# Patient Record
Sex: Female | Born: 1960 | Race: Black or African American | Hispanic: No | Marital: Single | Smoking: Never smoker
Health system: Southern US, Community
[De-identification: ages and names within clinical notes are randomized; demographics above are authoritative.]

## PROBLEM LIST (undated history)

## (undated) DIAGNOSIS — G609 Hereditary and idiopathic neuropathy, unspecified: Secondary | ICD-10-CM

## (undated) DIAGNOSIS — I10 Essential (primary) hypertension: Secondary | ICD-10-CM

## (undated) DIAGNOSIS — I639 Cerebral infarction, unspecified: Secondary | ICD-10-CM

## (undated) DIAGNOSIS — D239 Other benign neoplasm of skin, unspecified: Secondary | ICD-10-CM

## (undated) DIAGNOSIS — E785 Hyperlipidemia, unspecified: Secondary | ICD-10-CM

## (undated) DIAGNOSIS — I679 Cerebrovascular disease, unspecified: Secondary | ICD-10-CM

## (undated) DIAGNOSIS — D219 Benign neoplasm of connective and other soft tissue, unspecified: Secondary | ICD-10-CM

## (undated) DIAGNOSIS — E119 Type 2 diabetes mellitus without complications: Secondary | ICD-10-CM

## (undated) HISTORY — DX: Other benign neoplasm of skin, unspecified: D23.9

## (undated) HISTORY — DX: Essential (primary) hypertension: I10

## (undated) HISTORY — DX: Type 2 diabetes mellitus without complications: E11.9

## (undated) HISTORY — PX: ABDOMINAL HYSTERECTOMY: SHX81

## (undated) HISTORY — DX: Benign neoplasm of connective and other soft tissue, unspecified: D21.9

## (undated) HISTORY — DX: Hereditary and idiopathic neuropathy, unspecified: G60.9

## (undated) HISTORY — DX: Hyperlipidemia, unspecified: E78.5

## (undated) HISTORY — DX: Cerebrovascular disease, unspecified: I67.9

---

## 2000-05-11 ENCOUNTER — Emergency Department (HOSPITAL_COMMUNITY): Admission: EM | Admit: 2000-05-11 | Discharge: 2000-05-11 | Payer: Self-pay | Admitting: Emergency Medicine

## 2000-05-11 ENCOUNTER — Encounter: Payer: Self-pay | Admitting: Emergency Medicine

## 2000-12-22 ENCOUNTER — Encounter: Admission: RE | Admit: 2000-12-22 | Discharge: 2000-12-22 | Payer: Self-pay | Admitting: Internal Medicine

## 2000-12-22 ENCOUNTER — Encounter (HOSPITAL_BASED_OUTPATIENT_CLINIC_OR_DEPARTMENT_OTHER): Payer: Self-pay | Admitting: Internal Medicine

## 2003-11-08 ENCOUNTER — Inpatient Hospital Stay (HOSPITAL_COMMUNITY): Admission: EM | Admit: 2003-11-08 | Discharge: 2003-11-16 | Payer: Self-pay | Admitting: Emergency Medicine

## 2003-11-10 ENCOUNTER — Ambulatory Visit: Payer: Self-pay | Admitting: Physical Medicine & Rehabilitation

## 2003-11-16 ENCOUNTER — Inpatient Hospital Stay (HOSPITAL_COMMUNITY)
Admission: RE | Admit: 2003-11-16 | Discharge: 2003-12-11 | Payer: Self-pay | Admitting: Physical Medicine & Rehabilitation

## 2003-11-16 ENCOUNTER — Ambulatory Visit: Payer: Self-pay | Admitting: Physical Medicine & Rehabilitation

## 2004-01-02 ENCOUNTER — Encounter
Admission: RE | Admit: 2004-01-02 | Discharge: 2004-03-22 | Payer: Self-pay | Admitting: Physical Medicine & Rehabilitation

## 2004-01-04 ENCOUNTER — Ambulatory Visit: Payer: Self-pay | Admitting: Physical Medicine & Rehabilitation

## 2004-01-22 ENCOUNTER — Ambulatory Visit: Payer: Self-pay | Admitting: Internal Medicine

## 2004-02-27 ENCOUNTER — Ambulatory Visit: Payer: Self-pay | Admitting: Physical Medicine & Rehabilitation

## 2004-03-22 ENCOUNTER — Encounter
Admission: RE | Admit: 2004-03-22 | Discharge: 2004-06-14 | Payer: Self-pay | Admitting: Physical Medicine & Rehabilitation

## 2004-03-29 ENCOUNTER — Ambulatory Visit: Payer: Self-pay | Admitting: Internal Medicine

## 2004-04-25 ENCOUNTER — Ambulatory Visit: Payer: Self-pay | Admitting: Physical Medicine & Rehabilitation

## 2004-05-27 ENCOUNTER — Ambulatory Visit: Payer: Self-pay | Admitting: Internal Medicine

## 2004-06-14 ENCOUNTER — Encounter
Admission: RE | Admit: 2004-06-14 | Discharge: 2004-09-12 | Payer: Self-pay | Admitting: Physical Medicine & Rehabilitation

## 2004-06-18 ENCOUNTER — Ambulatory Visit: Payer: Self-pay | Admitting: Physical Medicine & Rehabilitation

## 2004-07-25 ENCOUNTER — Ambulatory Visit (HOSPITAL_BASED_OUTPATIENT_CLINIC_OR_DEPARTMENT_OTHER): Admission: RE | Admit: 2004-07-25 | Discharge: 2004-07-25 | Payer: Self-pay | Admitting: Plastic Surgery

## 2004-08-21 ENCOUNTER — Ambulatory Visit: Payer: Self-pay | Admitting: Internal Medicine

## 2004-09-16 ENCOUNTER — Encounter
Admission: RE | Admit: 2004-09-16 | Discharge: 2004-12-15 | Payer: Self-pay | Admitting: Physical Medicine & Rehabilitation

## 2004-09-16 ENCOUNTER — Ambulatory Visit: Payer: Self-pay | Admitting: Physical Medicine & Rehabilitation

## 2004-11-20 ENCOUNTER — Ambulatory Visit: Payer: Self-pay | Admitting: Internal Medicine

## 2005-02-20 ENCOUNTER — Ambulatory Visit: Payer: Self-pay | Admitting: Internal Medicine

## 2005-05-16 ENCOUNTER — Ambulatory Visit: Payer: Self-pay | Admitting: Internal Medicine

## 2005-06-03 ENCOUNTER — Inpatient Hospital Stay (HOSPITAL_COMMUNITY): Admission: EM | Admit: 2005-06-03 | Discharge: 2005-06-05 | Payer: Self-pay | Admitting: Emergency Medicine

## 2005-06-03 ENCOUNTER — Encounter (INDEPENDENT_AMBULATORY_CARE_PROVIDER_SITE_OTHER): Payer: Self-pay | Admitting: Cardiology

## 2005-06-17 ENCOUNTER — Ambulatory Visit: Payer: Self-pay | Admitting: Internal Medicine

## 2005-07-15 ENCOUNTER — Ambulatory Visit: Payer: Self-pay | Admitting: Internal Medicine

## 2005-08-13 ENCOUNTER — Ambulatory Visit: Payer: Self-pay | Admitting: Internal Medicine

## 2005-11-14 ENCOUNTER — Ambulatory Visit: Payer: Self-pay | Admitting: Internal Medicine

## 2006-02-13 ENCOUNTER — Ambulatory Visit: Payer: Self-pay | Admitting: Internal Medicine

## 2006-02-13 LAB — CONVERTED CEMR LAB
Albumin: 3.7 g/dL (ref 3.5–5.2)
BUN: 7 mg/dL (ref 6–23)
Calcium: 9.4 mg/dL (ref 8.4–10.5)
Chloride: 105 meq/L (ref 96–112)
Creatinine, Ser: 1 mg/dL (ref 0.4–1.2)
GFR calc non Af Amer: 64 mL/min
Glucose, Bld: 112 mg/dL — ABNORMAL HIGH (ref 70–99)
Potassium: 4.2 meq/L (ref 3.5–5.1)

## 2006-03-23 ENCOUNTER — Ambulatory Visit: Payer: Self-pay | Admitting: Internal Medicine

## 2006-05-18 ENCOUNTER — Ambulatory Visit: Payer: Self-pay | Admitting: Internal Medicine

## 2006-08-18 ENCOUNTER — Ambulatory Visit: Payer: Self-pay | Admitting: Internal Medicine

## 2006-10-12 DIAGNOSIS — I10 Essential (primary) hypertension: Secondary | ICD-10-CM

## 2006-10-12 DIAGNOSIS — E785 Hyperlipidemia, unspecified: Secondary | ICD-10-CM

## 2006-10-12 DIAGNOSIS — E119 Type 2 diabetes mellitus without complications: Secondary | ICD-10-CM

## 2006-10-12 HISTORY — DX: Essential (primary) hypertension: I10

## 2006-10-12 HISTORY — DX: Hyperlipidemia, unspecified: E78.5

## 2006-10-12 HISTORY — DX: Type 2 diabetes mellitus without complications: E11.9

## 2006-10-21 DIAGNOSIS — G609 Hereditary and idiopathic neuropathy, unspecified: Secondary | ICD-10-CM

## 2006-10-21 HISTORY — DX: Hereditary and idiopathic neuropathy, unspecified: G60.9

## 2006-11-18 ENCOUNTER — Ambulatory Visit: Payer: Self-pay | Admitting: Internal Medicine

## 2006-11-18 LAB — CONVERTED CEMR LAB: Hgb A1c MFr Bld: 7.8 % — ABNORMAL HIGH (ref 4.6–6.0)

## 2007-02-22 ENCOUNTER — Ambulatory Visit: Payer: Self-pay | Admitting: Internal Medicine

## 2007-05-24 ENCOUNTER — Ambulatory Visit: Payer: Self-pay | Admitting: Internal Medicine

## 2007-08-23 ENCOUNTER — Ambulatory Visit: Payer: Self-pay | Admitting: Internal Medicine

## 2007-11-24 ENCOUNTER — Ambulatory Visit: Payer: Self-pay | Admitting: Internal Medicine

## 2007-11-24 LAB — CONVERTED CEMR LAB
AST: 15 units/L (ref 0–37)
Basophils Absolute: 0 10*3/uL (ref 0.0–0.1)
Basophils Relative: 0.4 % (ref 0.0–3.0)
Creatinine,U: 631.3 mg/dL
Eosinophils Absolute: 0.1 10*3/uL (ref 0.0–0.7)
Eosinophils Relative: 1.7 % (ref 0.0–5.0)
GFR calc Af Amer: 68 mL/min
GFR calc non Af Amer: 57 mL/min
Glucose, Bld: 202 mg/dL — ABNORMAL HIGH (ref 70–99)
HDL: 48.1 mg/dL (ref >39.0)
Hemoglobin: 11.5 g/dL — ABNORMAL LOW (ref 12.0–15.0)
Hgb A1c MFr Bld: 9.1 % — ABNORMAL HIGH (ref 4.6–6.0)
Lymphocytes Relative: 27.4 % (ref 12.0–46.0)
Microalb, Ur: 9.8 mg/dL — ABNORMAL HIGH (ref 0.0–1.9)
Neutro Abs: 4.8 10*3/uL (ref 1.4–7.7)
RBC: 4.43 M/uL (ref 3.87–5.11)
RDW: 14.9 % — ABNORMAL HIGH (ref 11.5–14.6)
Sodium: 141 meq/L (ref 135–145)
TSH: 1.47 microintl units/mL (ref 0.35–5.50)
Total CHOL/HDL Ratio: 4.7
Total Protein: 7.5 g/dL (ref 6.0–8.3)
Triglycerides: 142 mg/dL (ref 0–149)
VLDL: 28 mg/dL (ref 0–40)
WBC: 7.6 10*3/uL (ref 4.5–10.5)

## 2007-11-25 ENCOUNTER — Telehealth: Payer: Self-pay | Admitting: Internal Medicine

## 2008-02-21 ENCOUNTER — Ambulatory Visit: Payer: Self-pay | Admitting: Internal Medicine

## 2008-02-22 LAB — CONVERTED CEMR LAB: Hgb A1c MFr Bld: 7 % — ABNORMAL HIGH (ref 4.6–6.0)

## 2008-05-24 ENCOUNTER — Ambulatory Visit: Payer: Self-pay | Admitting: Internal Medicine

## 2008-05-24 DIAGNOSIS — M25569 Pain in unspecified knee: Secondary | ICD-10-CM

## 2008-09-08 ENCOUNTER — Ambulatory Visit: Payer: Self-pay | Admitting: Internal Medicine

## 2008-09-08 DIAGNOSIS — D239 Other benign neoplasm of skin, unspecified: Secondary | ICD-10-CM | POA: Insufficient documentation

## 2008-09-08 HISTORY — DX: Other benign neoplasm of skin, unspecified: D23.9

## 2008-09-12 LAB — CONVERTED CEMR LAB: Hgb A1c MFr Bld: 10.2 % — ABNORMAL HIGH (ref 4.6–6.5)

## 2008-09-21 ENCOUNTER — Ambulatory Visit: Payer: Self-pay | Admitting: Internal Medicine

## 2008-12-04 ENCOUNTER — Ambulatory Visit: Payer: Self-pay | Admitting: Internal Medicine

## 2008-12-04 LAB — CONVERTED CEMR LAB
Glucose, Urine, Semiquant: NEGATIVE
Nitrite: NEGATIVE
pH: 5

## 2008-12-05 LAB — CONVERTED CEMR LAB
ALT: 15 units/L (ref 0–35)
AST: 18 units/L (ref 0–37)
BUN: 14 mg/dL (ref 6–23)
Basophils Relative: 0.2 % (ref 0.0–3.0)
Calcium: 9.1 mg/dL (ref 8.4–10.5)
Cholesterol: 262 mg/dL — ABNORMAL HIGH (ref 0–200)
Creatinine,U: 363.7 mg/dL
Direct LDL: 197.2 mg/dL
GFR calc non Af Amer: 76.01 mL/min (ref >60)
HDL: 43.6 mg/dL (ref >39.00)
Hemoglobin: 11.6 g/dL — ABNORMAL LOW (ref 12.0–15.0)
Hgb A1c MFr Bld: 7.8 % — ABNORMAL HIGH (ref 4.6–6.5)
MCHC: 33.5 g/dL (ref 30.0–36.0)
MCV: 76 fL — ABNORMAL LOW (ref 78.0–100.0)
Microalb Creat Ratio: 64.6 mg/g — ABNORMAL HIGH (ref 0.0–30.0)
Monocytes Absolute: 0.6 10*3/uL (ref 0.1–1.0)
Monocytes Relative: 7.8 % (ref 3.0–12.0)
Platelets: 160 10*3/uL (ref 150.0–400.0)
Potassium: 4.1 meq/L (ref 3.5–5.1)
RBC: 4.55 M/uL (ref 3.87–5.11)
RDW: 16.2 % — ABNORMAL HIGH (ref 11.5–14.6)
TSH: 1.29 microintl units/mL (ref 0.35–5.50)
Total CHOL/HDL Ratio: 6
Total Protein: 7.4 g/dL (ref 6.0–8.3)
WBC: 7.4 10*3/uL (ref 4.5–10.5)

## 2008-12-11 ENCOUNTER — Ambulatory Visit: Payer: Self-pay | Admitting: Internal Medicine

## 2008-12-11 DIAGNOSIS — I679 Cerebrovascular disease, unspecified: Secondary | ICD-10-CM

## 2008-12-11 HISTORY — DX: Cerebrovascular disease, unspecified: I67.9

## 2009-03-16 ENCOUNTER — Ambulatory Visit: Payer: Self-pay | Admitting: Internal Medicine

## 2009-03-19 ENCOUNTER — Encounter: Payer: Self-pay | Admitting: Internal Medicine

## 2009-03-19 LAB — CONVERTED CEMR LAB: AST: 16 units/L (ref 0–37)

## 2009-06-22 ENCOUNTER — Ambulatory Visit: Payer: Self-pay | Admitting: Internal Medicine

## 2009-06-22 LAB — CONVERTED CEMR LAB: Hgb A1c MFr Bld: 8.4 % — ABNORMAL HIGH (ref 4.6–6.5)

## 2009-06-27 ENCOUNTER — Telehealth: Payer: Self-pay

## 2009-07-02 ENCOUNTER — Encounter: Payer: Self-pay | Admitting: Internal Medicine

## 2009-09-21 ENCOUNTER — Ambulatory Visit: Payer: Self-pay | Admitting: Internal Medicine

## 2009-12-24 ENCOUNTER — Ambulatory Visit: Payer: Self-pay | Admitting: Internal Medicine

## 2010-03-29 ENCOUNTER — Ambulatory Visit
Admission: RE | Admit: 2010-03-29 | Discharge: 2010-03-29 | Payer: Self-pay | Source: Home / Self Care | Attending: Internal Medicine | Admitting: Internal Medicine

## 2010-03-29 ENCOUNTER — Other Ambulatory Visit: Payer: Self-pay | Admitting: Internal Medicine

## 2010-03-29 LAB — HEMOGLOBIN A1C: Hgb A1c MFr Bld: 8.2 % — ABNORMAL HIGH (ref 4.6–6.5)

## 2010-04-09 NOTE — Assessment & Plan Note (Signed)
Summary: 3 month rov/njr  aA  Vital Signs:  Patient profile:   50 year old female Weight:      244 pounds Temp:     98.5 degrees F oral BP sitting:   160 / 100  (right arm) Cuff size:   large  Vitals Entered By: Duard Brady LPN (June 22, 2009 10:30 AM) CC: 3 mos rov - doing well    fbs96 Is Patient Diabetic? Yes Did you bring your meter with you today? No   CC:  3 mos rov - doing well    fbs96.  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  He denies any focal neurological symptoms.  Her last cholesterol was elevated, but she had been taking her statin therapy only sporadically.  She states her diabetes has done quite well.  She is on Lantus 75 units at bedtime, and fasting blood sugars are essentially normal.  She is also on mealtime insulin with sliding scale coverage.  She has multi-drug-resistant hypertension, and she claims compliance, but pressure on arrival today.  Was moderately high.  Blood pressure readings last two visits have also been mildly elevated.  Her multi-drug regimen does not include diuretic therapy  Allergies (verified): No Known Drug Allergies  Past History:  Past Medical History: Reviewed history from 09/25/2008 and no changes required. 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 Thumb Surgery Hysterectomy  Review of Systems  The patient denies anorexia, fever, weight loss, weight gain, vision loss, decreased hearing, hoarseness, chest pain, syncope, dyspnea on exertion, peripheral edema, prolonged cough, headaches, hemoptysis, abdominal pain, melena, hematochezia, severe indigestion/heartburn, hematuria, incontinence, genital  sores, muscle weakness, suspicious skin lesions, transient blindness, difficulty walking, depression, unusual weight change, abnormal bleeding, enlarged lymph nodes, angioedema, and breast masses.    Physical Exam  General:  overweight-appearing.  is 160/95 Head:  Normocephalic and atraumatic without obvious abnormalities. No apparent alopecia or balding. Eyes:  No corneal or conjunctival inflammation noted. EOMI. Perrla. Funduscopic exam benign, without hemorrhages, exudates or papilledema. Vision grossly normal. Mouth:  Oral mucosa and oropharynx without lesions or exudates.  Teeth in good repair. Neck:  No deformities, masses, or tenderness noted. Lungs:  Normal respiratory effort, chest expands symmetrically. Lungs are clear to auscultation, no crackles or wheezes. Heart:  Normal rate and regular rhythm. S1 and S2 normal without gallop, murmur, click, rub or other extra sounds. Abdomen:  Bowel sounds positive,abdomen soft and non-tender without masses, organomegaly or hernias noted. Msk:  No deformity or scoliosis noted of thoracic or lumbar spine.   Pulses:  R and L carotid,radial,femoral,dorsalis pedis and posterior tibial pulses are full and equal bilaterally Extremities:  No clubbing, cyanosis, edema, or deformity noted with normal full range of motion of all joints.     Impression & Recommendations:  Problem # 1:  CEREBROVASCULAR DISEASE (ICD-437.9)  Problem # 2:  HYPERTENSION (ICD-401.9)  Her updated medication list for this problem includes:    Benazepril Hcl 40 Mg Tabs (Benazepril hcl) .Marland Kitchen... 1 once daily    Labetalol Hcl 200 Mg Tabs (Labetalol hcl) .Marland Kitchen... Take 2 twice a day    Amlodipine Besylate 10 Mg Tabs (Amlodipine besylate) .Marland Kitchen... 1 once daily    Chlorthalidone 25 Mg Tabs (Chlorthalidone) ..... One daily  Her  updated medication list for this problem includes:    Benazepril Hcl 40 Mg Tabs (Benazepril hcl) .Marland Kitchen... 1 once daily    Labetalol Hcl 200 Mg Tabs (Labetalol hcl)  .Marland Kitchen... Take 2 twice a day    Amlodipine Besylate 10 Mg Tabs (Amlodipine besylate) .Marland Kitchen... 1 once daily    Chlorthalidone 25 Mg Tabs (Chlorthalidone) ..... One daily  Problem # 3:  HYPERLIPIDEMIA (ICD-272.4)  Orders: Venipuncture ([address]) TLB-AST (SGOT) (84450-SGOT) TLB-Cholesterol, Total (82465-CHO)  Problem # 4:  DIABETES MELLITUS, TYPE II (ICD-250.00)  Her updated medication list for this problem includes:    Benazepril Hcl 40 Mg Tabs (Benazepril hcl) .Marland Kitchen... 1 once daily    Aspirin 325 Mg Tbec (Aspirin) ..... Once daily    Novolog Flexpen 100 Unit/ml Soln (Insulin aspart) .Marland Kitchen... 14 units prior to breakfast and dinner, and 16 units prior to lunch; add 4 units of blood sugar is over 200    Lantus 100 Unit/ml Soln (Insulin glargine) .Marland KitchenMarland KitchenMarland KitchenMarland Kitchen 75 units at bedtime    Her updated medication list for this problem includes:    Benazepril Hcl 40 Mg Tabs (Benazepril hcl) .Marland Kitchen... 1 once daily    Aspirin 325 Mg Tbec (Aspirin) ..... Once daily    Novolog Flexpen 100 Unit/ml Soln (Insulin aspart) .Marland Kitchen... 14 units prior to breakfast and dinner, and 16 units prior to lunch; add 4 units of blood sugar is over 200    Lantus 100 Unit/ml Soln (Insulin glargine) .Marland KitchenMarland KitchenMarland KitchenMarland Kitchen 75 units at bedtime  Orders: TLB-A1C / Hgb A1C (Glycohemoglobin) (83036-A1C)  Complete Medication List: 1)  Benazepril Hcl 40 Mg Tabs (Benazepril hcl) .Marland Kitchen.. 1 once daily 2)  Labetalol Hcl 200 Mg Tabs (Labetalol hcl) .... Take 2 twice a day 3)  Simvastatin 80 Mg Tabs (simvastatin)  .... Take 1 tablet by mouth once a day 4)  Potassium Chloride 20 Meq Pack (Potassium chloride) .... Once daily 5)  Neurontin 300 Mg Caps (Gabapentin) .... Three times a day 6)  Lexapro 20 Mg Tabs (Escitalopram oxalate) .... Take 1 tablet by mouth once a day 7)  Aspirin 325 Mg Tbec (Aspirin) .... Once daily 8)  Amlodipine Besylate 10 Mg Tabs (Amlodipine besylate) .Marland Kitchen.. 1 once daily 9)  Diphenoxylate-atropine 2.5-0.025 Mg Tabs (Diphenoxylate-atropine) .... One every 6 hours  for diarrhea 10)  Novolog Flexpen 100 Unit/ml Soln (Insulin aspart) .Marland Kitchen.. 14 units prior to breakfast and dinner, and 16 units prior to lunch; add 4 units of blood sugar is over 200 11)  Novofine 30g X 8 Mm Misc (Insulin pen needle) .... As directed 12)  Bayer Contour Test Strp (Glucose blood) .... Use, daily 13)  Lantus 100 Unit/ml Soln (Insulin glargine) .... 75 units at bedtime 14)  Chlorthalidone 25 Mg Tabs (Chlorthalidone) .... One daily  Patient Instructions: 1)  Please schedule a follow-up appointment in 3 months. 2)  Limit your Sodium (Salt) to less than 2 grams a day(slightly less than 1/2 a teaspoon) to prevent fluid retention, swelling, or worsening of symptoms. 3)  It is important that you exercise regularly at least 20 minutes 5 times a week. If you develop chest pain, have severe difficulty breathing, or feel very tired , stop exercising immediately and seek medical attention. 4)  You need to lose weight. Consider a lower calorie diet and regular exercise.  5)  Check your blood sugars regularly. If your readings are usually above : or below 70 you should contact our office. 6)  It is important that your Diabetic A1c level is checked every 3  months. 7)  See your eye doctor yearly to check for diabetic eye damage. Prescriptions: CHLORTHALIDONE 25 MG TABS (CHLORTHALIDONE) one daily  #90 x 6   Entered and Authorized by:   Gordy Savers  MD   Signed by:   Gordy Savers  MD on 06/22/2009   Method used:   Electronically to        [facility] [phone]* (retail)       [address]       [place], [place]  [address]       Ph: [phone]       Fax: [phone]   RxID:   [id number] LANTUS 100 UNIT/ML SOLN (INSULIN GLARGINE) 75 units at bedtime  #3 x 6   Entered and Authorized by:   Gordy Savers  MD   Signed by:   Gordy Savers  MD on 06/22/2009   Method used:   Electronically to        [facility] [phone]* (retail)       [address]       [place], [place]  [address]       Ph: [phone]       Fax: [phone]   RxID:   [id number] BAYER CONTOUR TEST  STRP (GLUCOSE BLOOD) use, daily  #180 x 6   Entered and Authorized by:   Gordy Savers  MD   Signed by:   Gordy Savers  MD on 06/22/2009   Method used:   Electronically to        [facility] [phone]* (retail)       [address]       [place], [place]  [address]       Ph: [phone]       Fax: [phone]   RxID:   [id number] NOVOFINE 30G X 8 MM MISC (INSULIN PEN NEEDLE) as directed  #90 x 6   Entered and Authorized by:   Gordy Savers  MD   Signed by:   Gordy Savers  MD on 06/22/2009   Method used:   Electronically to        [facility] [phone]* (retail)       [address]       [place], [place]  [address]       Ph: [phone]       Fax: [phone]   RxID:   [id number] NOVOLOG FLEXPEN 100 UNIT/ML SOLN (INSULIN ASPART) 14 units prior to breakfast and dinner, and 16 units prior to lunch; add 4 units of blood sugar is over 200  #3 x 3   Entered and Authorized by:   Gordy Savers  MD   Signed by:   Gordy Savers  MD on 06/22/2009   Method used:   Electronically to        [facility] [phone]* (retail)       [address]       [place], [place]  [address]       Ph: [phone]       Fax: [phone]   RxID:   [id number] AMLODIPINE BESYLATE 10 MG  TABS (AMLODIPINE BESYLATE) 1 once daily  #90 x 6   Entered and Authorized by:   Gordy Savers  MD   Signed by:   Gordy Savers  MD on 06/22/2009   Method used:   Electronically to        [facility]  [address] [phone]* (retail)       [address]       [place], [place]  [address]       Ph: [phone]       Fax: [phone]   RxID:   [id number] LEXAPRO 20 MG  TABS (ESCITALOPRAM OXALATE) Take 1 tablet by mouth once a day  #90 x 6   Entered and Authorized by:   Gordy Savers  MD   Signed by:   Gordy Savers  MD  on 06/22/2009   Method used:   Electronically to        [facility] [phone]* (retail)       [address]       [place], [place]  [address]       Ph: [phone]       Fax: [phone]   RxID:   [id number] NEURONTIN 300 MG  CAPS (GABAPENTIN) three times a day  #270 x 6   Entered and Authorized by:   Gordy Savers  MD   Signed by:   Gordy Savers  MD on 06/22/2009   Method used:   Electronically to        [facility] [phone]* (retail)       [address]       [place], [place]  [address]       Ph: [phone]       Fax: [phone]   RxID:   [id number] POTASSIUM CHLORIDE 20 MEQ  PACK (POTASSIUM CHLORIDE) once daily  #90 x 6   Entered and Authorized by:   Gordy Savers  MD   Signed by:   Gordy Savers  MD on 06/22/2009   Method used:   Electronically to        [facility] [phone]* (retail)       [address]       [place], [place]  [address]       Ph: [phone]       Fax: [phone]   RxID:   [id number] LABETALOL HCL 200 MG  TABS (LABETALOL HCL) Take 2 twice a day  #270 x 6   Entered and Authorized by:   Gordy Savers  MD   Signed by:   Gordy Savers  MD on 06/22/2009   Method used:   Electronically to        [facility] [phone]* (retail)       [address]       [place], [place]  [address]       Ph: [phone]       Fax: [phone]   RxID:   [id number] BENAZEPRIL HCL 40 MG TABS (BENAZEPRIL HCL) 1 once daily  #90 x 6   Entered and Authorized by:   Gordy Savers  MD   Signed by:   Gordy Savers  MD on 06/22/2009   Method used:   Electronically to        [facility] [phone]* (retail)       [address]       [place], [place]  [address]       Ph: [phone]       Fax: [phone]   RxID:   [id number]

## 2010-04-09 NOTE — Miscellaneous (Signed)
  Clinical Lists Changes  Medications: Changed medication from NOVOLOG FLEXPEN 100 UNIT/ML SOLN (INSULIN ASPART) 12 units prior to breakfast and dinner, and 14 units prior to lunch; add 4 units of blood sugar is over 200 to NOVOLOG FLEXPEN 100 UNIT/ML SOLN (INSULIN ASPART) 14 units prior to breakfast and dinner, and 16 units prior to lunch; add 4 units of blood sugar is over 200

## 2010-04-09 NOTE — Progress Notes (Signed)
Summary: test strips  Phone Note From Pharmacy   Caller: [facility] [address] [phone]* Call For: test strips  Summary of Call: rec'd fax for test strips - need to call ins to see what they will cover. Initial call taken by: Duard Brady LPN,  June 27, 2009 10:00 AM  Follow-up for Phone Call        attempt to call pt at home # to see what meter she uses. no ans no machine.  Follow-up by: Duard Brady LPN,  June 27, 2009 10:10 AM

## 2010-04-09 NOTE — Assessment & Plan Note (Signed)
Summary: 3 month fup//ccm   Vital Signs:  Patient profile:   50 year old female Weight:      250 pounds BP sitting:   160 / 100  (right arm) Cuff size:   regular  Vitals Entered By: Duard Brady LPN (September 21, 2009 11:01 AM) CC: 3 mos rov - doing well        fbs 101 Is Patient Diabetic? Yes Did you bring your meter with you today? No   CC:  3 mos rov - doing well        fbs 101.  History of Present Illness: 50 year old patient who has a history of multiple medical problems including insulin-dependent diabetes.  She has refractory multi-drug-resistant hypertension, dyslipidemia, and cerebrovascular disease.  There has been some compliance issues with her medication.  She states that she has taken no blood pressure medications today.  she denies any hypoglycemic reactions, diabetic complications include peripheral neuropathy.  Her fasting blood sugar this morning, 101.  She denies any new neurological symptoms  Allergies (verified): No Known Drug Allergies  Past History:  Past Medical History: Reviewed history from 09/25/2008 and no changes required. Diabetes mellitus, type II Hyperlipidemia Hypertension Peripheral neuropathy Fibroids Enodmetriosis Cerebrovascular Disease: s/p R brain stroke 8/05, R thalamic stroke 3/07  Review of Systems       The patient complains of weight gain, muscle weakness, difficulty walking, and depression.  The patient denies anorexia, fever, weight loss, vision loss, decreased hearing, hoarseness, chest pain, syncope, dyspnea on exertion, peripheral edema, prolonged cough, headaches, hemoptysis, abdominal pain, melena, hematochezia, severe indigestion/heartburn, hematuria, incontinence, genital sores, suspicious skin lesions, transient blindness, unusual weight change, abnormal bleeding, enlarged lymph nodes, angioedema, and breast masses.    Physical Exam  General:  overweight-appearing.  160/100 Head:  Normocephalic and atraumatic without  obvious abnormalities. No apparent alopecia or balding. Eyes:  No corneal or conjunctival inflammation noted. EOMI. Perrla. Funduscopic exam benign, without hemorrhages, exudates or papilledema. Vision grossly normal. Mouth:  Oral mucosa and oropharynx without lesions or exudates.  Teeth in good repair. Neck:  No deformities, masses, or tenderness noted. Lungs:  Normal respiratory effort, chest expands symmetrically. Lungs are clear to auscultation, no crackles or wheezes. Heart:  Normal rate and regular rhythm. S1 and S2 normal without gallop, murmur, click, rub or other extra sounds. Abdomen:  Bowel sounds positive,abdomen soft and non-tender without masses, organomegaly or hernias noted.   Impression & Recommendations:  Problem # 1:  CEREBROVASCULAR DISEASE (ICD-437.9)  Problem # 2:  HYPERTENSION (ICD-401.9)  Her updated medication list for this problem includes:    Benazepril Hcl 40 Mg Tabs (Benazepril hcl) .Marland Kitchen... 1 once daily    Labetalol Hcl 200 Mg Tabs (Labetalol hcl) .Marland Kitchen... Take 2 twice a day    Amlodipine Besylate 10 Mg Tabs (Amlodipine besylate) .Marland Kitchen... 1 once daily    Chlorthalidone 25 Mg Tabs (Chlorthalidone) ..... One daily  Her updated medication list for this problem includes:    Benazepril Hcl 40 Mg Tabs (Benazepril hcl) .Marland Kitchen... 1 once daily    Labetalol Hcl 200 Mg Tabs (Labetalol hcl) .Marland Kitchen... Take 2 twice a day    Amlodipine Besylate 10 Mg Tabs (Amlodipine besylate) .Marland Kitchen... 1 once daily    Chlorthalidone 25 Mg Tabs (Chlorthalidone) ..... One daily  Problem # 3:  DIABETES MELLITUS, TYPE II (ICD-250.00)  Her updated medication list for this problem includes:    Benazepril Hcl 40 Mg Tabs (Benazepril hcl) .Marland Kitchen... 1 once daily    Aspirin 325 Mg  Tbec (Aspirin) ..... Once daily    Novolog Flexpen 100 Unit/ml Soln (Insulin aspart) .Marland KitchenMarland KitchenMarland KitchenMarland Kitchen 15 units prior to breakfast and dinner, and 20  units prior to lunch; add 8 units if  blood sugar is over 200    Lantus 100 Unit/ml Soln (Insulin  glargine) .Marland KitchenMarland KitchenMarland KitchenMarland Kitchen 75 units at bedtime    Her updated medication list for this problem includes:    Benazepril Hcl 40 Mg Tabs (Benazepril hcl) .Marland Kitchen... 1 once daily    Aspirin 325 Mg Tbec (Aspirin) ..... Once daily    Novolog Flexpen 100 Unit/ml Soln (Insulin aspart) .Marland KitchenMarland KitchenMarland KitchenMarland Kitchen 15 units prior to breakfast and dinner, and 20  units prior to lunch; add 8 units if  blood sugar is over 200    Lantus 100 Unit/ml Soln (Insulin glargine) .Marland KitchenMarland KitchenMarland KitchenMarland Kitchen 75 units at bedtime  Orders: Venipuncture (956[address]) TLB-A1C / Hgb A1C (Glycohemoglobin) (83036-A1C)  Problem # 4:  HYPERLIPIDEMIA (ICD-272.4)  Complete Medication List: 1)  Benazepril Hcl 40 Mg Tabs (Benazepril hcl) .Marland Kitchen.. 1 once daily 2)  Labetalol Hcl 200 Mg Tabs (Labetalol hcl) .... Take 2 twice a day 3)  Simvastatin 80 Mg Tabs (simvastatin)  .... Take 1 tablet by mouth once a day 4)  Potassium Chloride 20 Meq Pack (Potassium chloride) .... Once daily 5)  Neurontin 300 Mg Caps (Gabapentin) .... Three times a day 6)  Lexapro 20 Mg Tabs (Escitalopram oxalate) .... Take 1 tablet by mouth once a day 7)  Aspirin 325 Mg Tbec (Aspirin) .... Once daily 8)  Amlodipine Besylate 10 Mg Tabs (Amlodipine besylate) .Marland Kitchen.. 1 once daily 9)  Diphenoxylate-atropine 2.5-0.025 Mg Tabs (Diphenoxylate-atropine) .... One every 6 hours for diarrhea 10)  Novolog Flexpen 100 Unit/ml Soln (Insulin aspart) .Marland Kitchen.. 15 units prior to breakfast and dinner, and 20  units prior to lunch; add 8 units if  blood sugar is over 200 11)  Novofine 30g X 8 Mm Misc (Insulin pen needle) .... As directed 12)  Bayer Contour Test Strp (Glucose blood) .... Use, daily 13)  Lantus 100 Unit/ml Soln (Insulin glargine) .... 75 units at bedtime 14)  Chlorthalidone 25 Mg Tabs (Chlorthalidone) .... One daily  Patient Instructions: 1)  Please schedule a follow-up appointment in 3 months. 2)  Limit your Sodium (Salt). 3)  It is important that you exercise regularly at least 20 minutes 5 times a week. If you develop chest  pain, have severe difficulty breathing, or feel very tired , stop exercising immediately and seek medical attention. 4)  You need to lose weight. Consider a lower calorie diet and regular exercise.  5)  Check your blood sugars regularly. If your readings are usually above : or below 70 you should contact our office. 6)  It is important that your Diabetic A1c level is checked every 3 months.

## 2010-04-09 NOTE — Assessment & Plan Note (Signed)
Summary: 3 month rov/njr   Vital Signs:  Patient profile:   50 year old female Weight:      250 pounds Temp:     98.3 degrees F oral BP sitting:   126 / 80  (left arm) Cuff size:   large  Vitals Entered By: Duard Brady LPN (December 24, 2009 10:14 AM) CC: 3 mos rov - doing ok , cold sx     fbs 105 Is Patient Diabetic? Yes Did you bring your meter with you today? No   CC:  3 mos rov - doing ok  and cold sx     fbs 105.  History of Present Illness: 50 year old patient who is seen today for follow up of her diabetes.  She has been using 12 units prior to each meal rather than the recommended amount of mealtime insulin.  There has been no weight loss.  She states that she gets no exercise.  She has hypertension and dyslipidemia.  She denies any cardiopulmonary complaints.  She does have some mild cold symptoms.  she has a history of cerebrovascular disease, which has been stable.  She denies any new focal neurological symptoms  Allergies (verified): No Known Drug Allergies  Past History:  Past Medical History: Reviewed history from 09/25/2008 and no changes required. 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 Thumb Surgery Hysterectomy  Family History: Reviewed history from 12/11/2008 and no changes required. Family History of Bowel disease Family History Diabetes 1st degree relative Family History High cholesterol Family History Hypertension positive- diabetes, coronary artery disease, hypertension cousin with breast cancer father with prostate cancer  Social History: Reviewed history from 12/11/2008 and no changes required. Single Never Smoked Alcohol use-no disabled due to her cerebrovascular disease   Review of Systems       The patient  complains of hoarseness and prolonged cough.  The patient denies anorexia, fever, weight loss, weight gain, vision loss, decreased hearing, chest pain, syncope, dyspnea on exertion, peripheral edema, headaches, hemoptysis, abdominal pain, melena, hematochezia, severe indigestion/heartburn, hematuria, incontinence, genital sores, muscle weakness, suspicious skin lesions, transient blindness, difficulty walking, depression, unusual weight change, abnormal bleeding, enlarged lymph nodes, angioedema, and breast masses.    Physical Exam  General:  overweight-appearing.  120/74overweight-appearing.   Head:  Normocephalic and atraumatic without obvious abnormalities. No apparent alopecia or balding. Eyes:  No corneal or conjunctival inflammation noted. EOMI. Perrla. Funduscopic exam benign, without hemorrhages, exudates or papilledema. Vision grossly normal. Mouth:  Oral mucosa and oropharynx without lesions or exudates.  Teeth in good repair. Neck:  No deformities, masses, or tenderness noted. Lungs:  Normal respiratory effort, chest expands symmetrically. Lungs are clear to auscultation, no crackles or wheezes. Heart:  Normal rate and regular rhythm. S1 and S2 normal without gallop, murmur, click, rub or other extra sounds. Abdomen:  Bowel sounds positive,abdomen soft and non-tender without masses, organomegaly or hernias noted. Msk:  No deformity or scoliosis noted of thoracic or lumbar spine.   Pulses:  R and L carotid,radial,femoral,dorsalis pedis and posterior tibial pulses are full and equal bilaterally Extremities:  No clubbing, cyanosis, edema, or deformity noted with normal full range of motion of all joints.    Diabetes Management Exam:    Eye Exam:       Eye Exam done elsewhere          Date:  10/26/2008          Results: diabetic retinopathy          Done by: ophthalmology   Impression & Recommendations:  Problem # 1:  CEREBROVASCULAR DISEASE (ICD-437.9)  Problem # 2:  HYPERTENSION  (ICD-401.9)  Her updated medication list for this problem includes:    Benazepril Hcl 40 Mg Tabs (Benazepril hcl) .Marland Kitchen... 1 once daily    Labetalol Hcl 200 Mg Tabs (Labetalol hcl) .Marland Kitchen... Take 2 twice a day    Amlodipine Besylate 10 Mg Tabs (Amlodipine besylate) .Marland Kitchen... 1 once daily    Chlorthalidone 25 Mg Tabs (Chlorthalidone) ..... One daily  Her updated medication list for this problem includes:    Benazepril Hcl 40 Mg Tabs (Benazepril hcl) .Marland Kitchen... 1 once daily    Labetalol Hcl 200 Mg Tabs (Labetalol hcl) .Marland Kitchen... Take 2 twice a day    Amlodipine Besylate 10 Mg Tabs (Amlodipine besylate) .Marland Kitchen... 1 once daily    Chlorthalidone 25 Mg Tabs (Chlorthalidone) ..... One daily  Problem # 3:  HYPERLIPIDEMIA (ICD-272.4)  Problem # 4:  DIABETES MELLITUS, TYPE II (ICD-250.00)  Her updated medication list for this problem includes:    Benazepril Hcl 40 Mg Tabs (Benazepril hcl) .Marland Kitchen... 1 once daily    Aspirin 325 Mg Tbec (Aspirin) ..... Once daily    Novolog Flexpen 100 Unit/ml Soln (Insulin aspart) .Marland KitchenMarland KitchenMarland KitchenMarland Kitchen 15 units prior to breakfast and dinner, and 20  units prior to lunch; add 8 units if  blood sugar is over 200    Lantus 100 Unit/ml Soln (Insulin glargine) .Marland KitchenMarland KitchenMarland KitchenMarland Kitchen 75 units at bedtime    Her updated medication list for this problem includes:    Benazepril Hcl 40 Mg Tabs (Benazepril hcl) .Marland Kitchen... 1 once daily    Aspirin 325 Mg Tbec (Aspirin) ..... Once daily    Novolog Flexpen 100 Unit/ml Soln (Insulin aspart) .Marland KitchenMarland KitchenMarland KitchenMarland Kitchen 15 units prior to breakfast and dinner, and 20  units prior to lunch; add 8 units if  blood sugar is over 200    Lantus 100 Unit/ml Soln (Insulin glargine) .Marland KitchenMarland KitchenMarland KitchenMarland Kitchen 75 units at bedtime  Complete Medication List: 1)  Benazepril Hcl 40 Mg Tabs (Benazepril hcl) .Marland Kitchen.. 1 once daily 2)  Labetalol Hcl 200 Mg Tabs (Labetalol hcl) .... Take 2 twice a day 3)  Simvastatin 80 Mg Tabs (simvastatin)  .... Take 1 tablet by mouth once a day 4)  Potassium Chloride 20 Meq Pack (Potassium chloride) .... Once daily 5)   Neurontin 300 Mg Caps (Gabapentin) .... Three times a day 6)  Lexapro 20 Mg Tabs (Escitalopram oxalate) .... Take 1 tablet by mouth once a day 7)  Aspirin 325 Mg Tbec (Aspirin) .... Once daily 8)  Amlodipine Besylate 10 Mg Tabs (Amlodipine besylate) .Marland Kitchen.. 1 once daily 9)  Diphenoxylate-atropine 2.5-0.025 Mg Tabs (Diphenoxylate-atropine) .... One every 6 hours for diarrhea 10)  Novolog Flexpen 100 Unit/ml Soln (Insulin aspart) .Marland Kitchen.. 15 units prior to breakfast and dinner, and 20  units prior to lunch; add 8 units if  blood sugar is over 200 11)  Novofine 30g X 8 Mm Misc (Insulin pen needle) .... As directed 12)  Bayer Contour Test Strp (Glucose blood) .... Use, daily 13)  Lantus 100 Unit/ml Soln (Insulin glargine) .... 75 units at bedtime 14)  Chlorthalidone 25 Mg Tabs (Chlorthalidone) .... One daily  Other Orders: Venipuncture (40981) TLB-A1C / Hgb A1C (Glycohemoglobin) (83036-A1C) Specimen Handling (19147)  Patient Instructions: 1)  Please schedule a follow-up appointment in 3 months. 2)  Limit your  Sodium (Salt). 3)  It is important that you exercise regularly at least 20 minutes 5 times a week. If you develop chest pain, have severe difficulty breathing, or feel very tired , stop exercising immediately and seek medical attention. 4)  You need to lose weight. Consider a lower calorie diet and regular exercise.  5)  Check your blood sugars regularly. If your readings are usually above : or below 70 you should contact our office. 6)  It is important that your Diabetic A1c level is checked every 3 months. 7)  See your eye doctor yearly to check for diabetic eye damage.   Orders Added: 1)  Est. Patient Level IV [53664] 2)  Venipuncture [36415] 3)  TLB-A1C / Hgb A1C (Glycohemoglobin) [83036-A1C] 4)  Specimen Handling [99000]

## 2010-04-09 NOTE — Assessment & Plan Note (Signed)
Summary: ROA/LABS/A1C/RCD   Vital Signs:  Patient profile:   50 year old female Weight:      247 pounds Temp:     98.6 degrees F oral BP sitting:   140 / 90  (left arm) Cuff size:   large  Vitals Entered By: Raechel Ache, RN (March 16, 2009 10:40 AM) aCC: 3 mo ROV, FBS 97. Is Patient Diabetic? Yes   CC:  3 mo ROV and FBS 97..  History of Present Illness: 50 year old patient seen today for follow up.  She has type 2 diabetes Struble after disease, hypertension, and dyslipidemia.  Her last hemoglobin A1c7.8.  Her mealtime insulin was adjusted and she is noted improved glycemic control.  Fasting blood sugar today was 97.  She states preprandial blood sugars are never over 200.  Her cerebrovascular condition has been stable.  She denies any focal neurological symptoms.  She has treated hypertension.  Her simvastatin was increased to 80 mg daily last visit  Allergies: No Known Drug Allergies  Past History:  Past Medical History: Reviewed history from 09/25/2008 and no changes required. 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 Thumb Surgery Hysterectomy  Review of Systems       The patient complains of difficulty walking.  The patient denies anorexia, fever, weight loss, weight gain, vision loss, decreased hearing, hoarseness, chest pain, syncope, dyspnea on exertion, peripheral edema, prolonged cough, headaches, hemoptysis, abdominal pain, melena, hematochezia, severe indigestion/heartburn, hematuria, incontinence, genital sores, muscle weakness, suspicious skin lesions, transient blindness, depression, unusual weight change, abnormal bleeding, enlarged lymph nodes, angioedema, and breast masses.         eye reevaluation scheduled for next week  Physical  Exam  General:  overweight-appearing.  130/80overweight-appearing.   Head:  Normocephalic and atraumatic without obvious abnormalities. No apparent alopecia or balding. Eyes:  No corneal or conjunctival inflammation noted. EOMI. Perrla. Funduscopic exam benign, without hemorrhages, exudates or papilledema. Vision grossly normal. Ears:  External ear exam shows no significant lesions or deformities.  Otoscopic examination reveals clear canals, tympanic membranes are intact bilaterally without bulging, retraction, inflammation or discharge. Hearing is grossly normal bilaterally. Mouth:  Oral mucosa and oropharynx without lesions or exudates.  Teeth in good repair. Neck:  No deformities, masses, or tenderness noted. Lungs:  Normal respiratory effort, chest expands symmetrically. Lungs are clear to auscultation, no crackles or wheezes. Heart:  Normal rate and regular rhythm. S1 and S2 normal without gallop, murmur, click, rub or other extra sounds. Abdomen:  Bowel sounds positive,abdomen soft and non-tender without masses, organomegaly or hernias noted. Msk:  No deformity or scoliosis noted of thoracic or lumbar spine.   Pulses:  R and L carotid,radial,femoral,dorsalis pedis and posterior tibial pulses are full and equal bilaterally Extremities:  No clubbing, cyanosis, edema, or deformity noted with normal full range of motion of all joints.     Impression & Recommendations:  Problem # 1:  DIABETES MELLITUS, TYPE II (ICD-250.00)  Her updated medication list for this problem includes:    Benazepril Hcl 40 Mg Tabs (Benazepril hcl) .Marland Kitchen... 1 once daily    Aspirin 325 Mg Tbec (Aspirin) ..... Once daily    Novolog Flexpen 100 Unit/ml Soln (Insulin aspart) .Marland Kitchen... 12 units prior to breakfast and dinner, and 14 units prior to lunch; add 4 units of blood sugar is over 200  Lantus 100 Unit/ml Soln (Insulin glargine) .Marland KitchenMarland KitchenMarland KitchenMarland Kitchen 75 units at bedtime will check a hemoglobin A1c    Her updated medication list for  this problem includes:    Benazepril Hcl 40 Mg Tabs (Benazepril hcl) .Marland Kitchen... 1 once daily    Aspirin 325 Mg Tbec (Aspirin) ..... Once daily    Novolog Flexpen 100 Unit/ml Soln (Insulin aspart) .Marland Kitchen... 12 units prior to breakfast and dinner, and 14 units prior to lunch; add 4 units of blood sugar is over 200    Lantus 100 Unit/ml Soln (Insulin glargine) .Marland KitchenMarland KitchenMarland KitchenMarland Kitchen 75 units at bedtime  Problem # 2:  CEREBROVASCULAR DISEASE (ICD-437.9) clinically stable  Problem # 3:  HYPERTENSION (ICD-401.9)  Her updated medication list for this problem includes:    Benazepril Hcl 40 Mg Tabs (Benazepril hcl) .Marland Kitchen... 1 once daily    Labetalol Hcl 200 Mg Tabs (Labetalol hcl) .Marland Kitchen... Take 2 twice a day    Amlodipine Besylate 10 Mg Tabs (Amlodipine besylate) .Marland Kitchen... 1 once daily  Her updated medication list for this problem includes:    Benazepril Hcl 40 Mg Tabs (Benazepril hcl) .Marland Kitchen... 1 once daily    Labetalol Hcl 200 Mg Tabs (Labetalol hcl) .Marland Kitchen... Take 2 twice a day    Amlodipine Besylate 10 Mg Tabs (Amlodipine besylate) .Marland Kitchen... 1 once daily  Problem # 4:  HYPERLIPIDEMIA (ICD-272.4)  will check her cholesterol, SGOT  Complete Medication List: 1)  Benazepril Hcl 40 Mg Tabs (Benazepril hcl) .Marland Kitchen.. 1 once daily 2)  Labetalol Hcl 200 Mg Tabs (Labetalol hcl) .... Take 2 twice a day 3)  Simvastatin 80 Mg Tabs (simvastatin)  .... Take 1 tablet by mouth once a day 4)  Potassium Chloride 20 Meq Pack (Potassium chloride) .... Once daily 5)  Neurontin 300 Mg Caps (Gabapentin) .... Three times a day 6)  Lexapro 20 Mg Tabs (Escitalopram oxalate) .... Take 1 tablet by mouth once a day 7)  Aspirin 325 Mg Tbec (Aspirin) .... Once daily 8)  Skelaxin 800 Mg Tabs (Metaxalone) .... Take 1 tablet by mouth at bedtime 9)  Amlodipine Besylate 10 Mg Tabs (Amlodipine besylate) .Marland Kitchen.. 1 once daily 10)  Diphenoxylate-atropine 2.5-0.025 Mg Tabs (Diphenoxylate-atropine) .... One every 6 hours for diarrhea 11)  Novolog Flexpen 100 Unit/ml Soln (Insulin  aspart) .Marland Kitchen.. 12 units prior to breakfast and dinner, and 14 units prior to lunch; add 4 units of blood sugar is over 200 12)  Novofine 30g X 8 Mm Misc (Insulin pen needle) .... As directed 13)  Bayer Contour Test Strp (Glucose blood) .... Use, daily 14)  Lantus 100 Unit/ml Soln (Insulin glargine) .... 75 units at bedtime  Other Orders: Venipuncture (16109) TLB-AST (SGOT) (84450-SGOT) TLB-Cholesterol, Total (82465-CHO) TLB-A1C / Hgb A1C (Glycohemoglobin) (83036-A1C)  Patient Instructions: 1)  Please schedule a follow-up appointment in 3 months. 2)  It is important that you exercise regularly at least 20 minutes 5 times a week. If you develop chest pain, have severe difficulty breathing, or feel very tired , stop exercising immediately and seek medical attention. 3)  You need to lose weight. Consider a lower calorie diet and regular exercise.  4)  Check your blood sugars regularly. If your readings are usually above : or below 70 you should contact our office. 5)  It is important that your Diabetic A1c level is checked every 3 months. 6)  See your eye doctor yearly to check for diabetic eye damage.

## 2010-04-11 NOTE — Assessment & Plan Note (Signed)
Summary: 50 MO ROV/MM   Vital Signs:  Patient profile:   50 year old female Weight:      248 pounds Temp:     98.2 degrees F oral BP sitting:   120 / 80  (left arm) Cuff size:   regular  Vitals Entered By: Duard Brady LPN (March 29, 2010 10:13 AM) CC: 3 mos rov - doing well   fbs 77 Is Patient Diabetic? Yes Did you bring your meter with you today? No   CC:  3 mos rov - doing well   fbs 77.  History of Present Illness: 50 year  old patient who is seen today for follow-up of her insulin requiring diabetes.  There's been a modest weight loss since her last visit here.  Her last in above.  A1c was 8.0.  No concerns or complaints she has tubal vascular disease, which has been stable.  She denies any focal neurological symptoms. She has treated hypertension, controlled on multiple drugs.  There have been no compliance issues, and blood pressure well controlled today. She remains on simvastatin 80 mg daily, hypertensive regimen.  Also includes amlodipine 10 mg daily  Allergies (verified): No Known Drug Allergies  Past History:  Past Medical History: Reviewed history from 09/25/2008 and no changes required. 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 Thumb Surgery Hysterectomy  Social History: Reviewed history from 12/11/2008 and no changes required. Single Never Smoked Alcohol use-no disabled due to her cerebrovascular disease   Review of Systems       The patient complains of muscle weakness and difficulty walking.  The patient denies anorexia, fever, weight loss, weight gain, vision loss, decreased hearing, hoarseness, chest pain, syncope, dyspnea on exertion, peripheral edema, prolonged cough, headaches, hemoptysis, abdominal pain, melena,  hematochezia, severe indigestion/heartburn, hematuria, incontinence, genital sores, suspicious skin lesions, transient blindness, depression, unusual weight change, abnormal bleeding, enlarged lymph nodes, angioedema, and breast masses.    Physical Exam  General:  overweight-appearing.  120/80 Head:  Normocephalic and atraumatic without obvious abnormalities. No apparent alopecia or balding. Eyes:  No corneal or conjunctival inflammation noted. EOMI. Perrla. Funduscopic exam benign, without hemorrhages, exudates or papilledema. Vision grossly normal. Ears:  External ear exam shows no significant lesions or deformities.  Otoscopic examination reveals clear canals, tympanic membranes are intact bilaterally without bulging, retraction, inflammation or discharge. Hearing is grossly normal bilaterally. Mouth:  Oral mucosa and oropharynx without lesions or exudates.  Teeth in good repair. Neck:  No deformities, masses, or tenderness noted. Lungs:  Normal respiratory effort, chest expands symmetrically. Lungs are clear to auscultation, no crackles or wheezes. Heart:  Normal rate and regular rhythm. S1 and S2 normal without gallop, murmur, click, rub or other extra sounds. Abdomen:  Bowel sounds positive,abdomen soft and non-tender without masses, organomegaly or hernias noted. Msk:  No deformity or scoliosis noted of thoracic or lumbar spine.   Extremities:  No clubbing, cyanosis, edema, or deformity noted with normal full range of motion of all joints.   Skin:  Intact without suspicious lesions or rashes Cervical Nodes:  No lymphadenopathy noted   Impression & Recommendations:  Problem # 1:  CEREBROVASCULAR DISEASE (ICD-437.9)  Problem # 2:  HYPERTENSION (ICD-401.9)  Her updated medication list for this problem includes:    Benazepril Hcl 40 Mg Tabs (Benazepril hcl) .Marland Kitchen... 1 once daily  Labetalol Hcl 200 Mg Tabs (Labetalol hcl) .Marland Kitchen... Take 2 twice a day    Amlodipine Besylate 10 Mg Tabs  (Amlodipine besylate) .Marland Kitchen... 1 once daily    Chlorthalidone 25 Mg Tabs (Chlorthalidone) ..... One daily  Problem # 3:  HYPERLIPIDEMIA (ICD-272.4)  Her updated medication list for this problem includes:    Pravastatin Sodium 80 Mg Tabs (Pravastatin sodium) ..... One daily  Problem # 4:  DIABETES MELLITUS, TYPE II (ICD-250.00)  Her updated medication list for this problem includes:    Benazepril Hcl 40 Mg Tabs (Benazepril hcl) .Marland Kitchen... 1 once daily    Aspirin 325 Mg Tbec (Aspirin) ..... Once daily    Novolog Flexpen 100 Unit/ml Soln (Insulin aspart) .Marland KitchenMarland KitchenMarland KitchenMarland Kitchen 15 units prior to breakfast and dinner, and 20  units prior to lunch; add 8 units if  blood sugar is over 200    Lantus 100 Unit/ml Soln (Insulin glargine) .Marland KitchenMarland KitchenMarland KitchenMarland Kitchen 75 units at bedtime  Orders: Venipuncture (13244) Specimen Handling (01027) TLB-A1C / Hgb A1C (Glycohemoglobin) (83036-A1C)  Complete Medication List: 1)  Benazepril Hcl 40 Mg Tabs (Benazepril hcl) .Marland Kitchen.. 1 once daily 2)  Labetalol Hcl 200 Mg Tabs (Labetalol hcl) .... Take 2 twice a day 3)  Potassium Chloride 20 Meq Pack (Potassium chloride) .... Once daily 4)  Neurontin 300 Mg Caps (Gabapentin) .... Three times a day 5)  Lexapro 20 Mg Tabs (Escitalopram oxalate) .... Take 1 tablet by mouth once a day 6)  Aspirin 325 Mg Tbec (Aspirin) .... Once daily 7)  Amlodipine Besylate 10 Mg Tabs (Amlodipine besylate) .Marland Kitchen.. 1 once daily 8)  Diphenoxylate-atropine 2.5-0.025 Mg Tabs (Diphenoxylate-atropine) .... One every 6 hours for diarrhea 9)  Novolog Flexpen 100 Unit/ml Soln (Insulin aspart) .Marland Kitchen.. 15 units prior to breakfast and dinner, and 20  units prior to lunch; add 8 units if  blood sugar is over 200 10)  Novofine 30g X 8 Mm Misc (Insulin pen needle) .... As directed 11)  Bayer Contour Test Strp (Glucose blood) .... Use, daily 12)  Lantus 100 Unit/ml Soln (Insulin glargine) .... 75 units at bedtime 13)  Chlorthalidone 25 Mg Tabs (Chlorthalidone) .... One daily 14)  Pravastatin Sodium 80  Mg Tabs (Pravastatin sodium) .... One daily  Patient Instructions: 1)  Please schedule a follow-up appointment in 3 months. 2)  It is important that you exercise regularly at least 20 minutes 5 times a week. If you develop chest pain, have severe difficulty breathing, or feel very tired , stop exercising immediately and seek medical attention. 3)  You need to lose weight. Consider a lower calorie diet and regular exercise.  4)  Check your blood sugars regularly. If your readings are usually above : or below 70 you should contact our office. 5)  It is important that your Diabetic A1c level is checked every 3 months. 6)  See your eye doctor yearly to check for diabetic eye damage. Prescriptions: PRAVASTATIN SODIUM 80 MG TABS (PRAVASTATIN SODIUM) one daily  #90 x 6   Entered and Authorized by:   Gordy Savers  MD   Signed by:   Gordy Savers  MD on 03/29/2010   Method used:   Electronically to        [facility] [phone]* (retail)       [address]       [place], [place]  [address]       Ph: [phone]       Fax: [phone]   RxID:   [id number] CHLORTHALIDONE 25  MG TABS (CHLORTHALIDONE) one daily  #90 x 6   Entered and Authorized by:   Gordy Savers  MD   Signed by:   Gordy Savers  MD on 03/29/2010   Method used:   Electronically to        [facility] [phone]* (retail)       [address]       [place], [place]  [address]       Ph: [phone]       Fax: [phone]   RxID:   [id number] LANTUS 100 UNIT/ML SOLN (INSULIN GLARGINE) 75 units at bedtime  #3 x 6   Entered and Authorized by:   Gordy Savers  MD   Signed by:   Gordy Savers  MD on 03/29/2010   Method used:   Electronically to        [facility] [phone]* (retail)       [address]       [place], [place]  [address]       Ph: [phone]       Fax: [phone]   RxID:   [id number] BAYER CONTOUR TEST  STRP (GLUCOSE BLOOD) use, daily  #180 x 6    Entered and Authorized by:   Gordy Savers  MD   Signed by:   Gordy Savers  MD on 03/29/2010   Method used:   Electronically to        [facility] [phone]* (retail)       [address]       [place], [place]  [address]       Ph: [phone]       Fax: [phone]   RxID:   [id number] NOVOLOG FLEXPEN 100 UNIT/ML SOLN (INSULIN ASPART) 15 units prior to breakfast and dinner, and 20  units prior to lunch; add 8 units if  blood sugar is over 200  #3 x 3   Entered and Authorized by:   Gordy Savers  MD   Signed by:   Gordy Savers  MD on 03/29/2010   Method used:   Electronically to        [facility] [phone]* (retail)       [address]       [place], [place]  [address]       Ph: [phone]       Fax: [phone]   RxID:   [id number] AMLODIPINE BESYLATE 10 MG  TABS (AMLODIPINE BESYLATE) 1 once daily  #90 x 6   Entered and Authorized by:   Gordy Savers  MD   Signed by:   Gordy Savers  MD on 03/29/2010   Method used:   Electronically to        [facility] [phone]* (retail)       [address]       [place], [place]  [address]       Ph: [phone]       Fax: [phone]   RxID:   [id number] LEXAPRO 20 MG  TABS (ESCITALOPRAM OXALATE) Take 1 tablet by mouth once a day  #90 x 6   Entered and Authorized by:   Gordy Savers  MD   Signed by:   Gordy Savers  MD on 03/29/2010   Method used:   Electronically to        [facility] [phone]* (retail)       [address]       [place], [place]  [address]       Ph: [phone]       Fax: [phone]   RxID:   [id number] NEURONTIN 300 MG  CAPS (GABAPENTIN) three times a day  #270 Each x 2   Entered and Authorized by:   Gordy Savers  MD   Signed by:   Gordy Savers  MD on 03/29/2010   Method used:   Electronically to        [facility] [phone]* (retail)       [address]       [place], [place]  [address]       Ph:  [phone]       Fax: [phone]   RxID:   [id number] POTASSIUM CHLORIDE 20 MEQ  PACK (POTASSIUM CHLORIDE) once daily  #90 x 6   Entered and Authorized by:   Gordy Savers  MD   Signed by:   Gordy Savers  MD on 03/29/2010   Method used:   Electronically to        [facility] [phone]* (retail)       [address]       [place], [place]  [address]       Ph: [phone]       Fax: [phone]   RxID:   [id number] LABETALOL HCL 200 MG  TABS (LABETALOL HCL) Take 2 twice a day  #270 x 6   Entered and Authorized by:   Gordy Savers  MD   Signed by:   Gordy Savers  MD on 03/29/2010   Method used:   Electronically to        [facility] [phone]* (retail)       [address]       [place], [place]  [address]       Ph: [phone]       Fax: [phone]   RxID:   [id number] BENAZEPRIL HCL 40 MG TABS (BENAZEPRIL HCL) 1 once daily  #90 x 6   Entered and Authorized by:   Gordy Savers  MD   Signed by:   Gordy Savers  MD on 03/29/2010   Method used:   Electronically to        [facility] [phone]* (retail)       [address]       [place], [place]  [address]       Ph: [phone]       Fax: [phone]   RxID:   [id number]    Orders Added: 1)  Est. Patient Level IV [98338] 2)  Venipuncture [25053] 3)  Specimen Handling [99000] 4)  TLB-A1C / Hgb A1C (Glycohemoglobin) [97673-A1P]

## 2010-04-12 NOTE — Medication Information (Signed)
Summary: Refill Request for Bayer Test Strips  Refill Request for Bayer Test Strips   Imported By: Maryln Gottron 07/09/2009 14:26:15  _____________________________________________________________________  External Attachment:    Type:   Image     Comment:   External Document

## 2010-06-27 ENCOUNTER — Encounter: Payer: Self-pay | Admitting: Internal Medicine

## 2010-07-02 ENCOUNTER — Ambulatory Visit (INDEPENDENT_AMBULATORY_CARE_PROVIDER_SITE_OTHER): Payer: Medicare Other | Admitting: Internal Medicine

## 2010-07-02 ENCOUNTER — Other Ambulatory Visit: Payer: Self-pay | Admitting: Internal Medicine

## 2010-07-02 ENCOUNTER — Encounter: Payer: Self-pay | Admitting: Internal Medicine

## 2010-07-02 DIAGNOSIS — G609 Hereditary and idiopathic neuropathy, unspecified: Secondary | ICD-10-CM

## 2010-07-02 DIAGNOSIS — I679 Cerebrovascular disease, unspecified: Secondary | ICD-10-CM

## 2010-07-02 DIAGNOSIS — E119 Type 2 diabetes mellitus without complications: Secondary | ICD-10-CM

## 2010-07-02 DIAGNOSIS — I10 Essential (primary) hypertension: Secondary | ICD-10-CM

## 2010-07-02 DIAGNOSIS — E785 Hyperlipidemia, unspecified: Secondary | ICD-10-CM

## 2010-07-02 LAB — CBC WITH DIFFERENTIAL/PLATELET
Basophils Relative: 0.3 % (ref 0.0–3.0)
Eosinophils Absolute: 0.1 10*3/uL (ref 0.0–0.7)
Eosinophils Relative: 1.5 % (ref 0.0–5.0)
MCHC: 32.3 g/dL (ref 30.0–36.0)
Monocytes Relative: 9.5 % (ref 3.0–12.0)
Neutro Abs: 5.5 10*3/uL (ref 1.4–7.7)
Neutrophils Relative %: 60.9 % (ref 43.0–77.0)
Platelets: 200 10*3/uL (ref 150.0–400.0)
RBC: 4.63 Mil/uL (ref 3.87–5.11)

## 2010-07-02 LAB — BASIC METABOLIC PANEL
CO2: 29 mEq/L (ref 19–32)
Creatinine, Ser: 1.5 mg/dL — ABNORMAL HIGH (ref 0.4–1.2)
Glucose, Bld: 226 mg/dL — ABNORMAL HIGH (ref 70–99)
Potassium: 4.4 mEq/L (ref 3.5–5.1)

## 2010-07-02 LAB — HEPATIC FUNCTION PANEL
ALT: 14 U/L (ref 0–35)
AST: 15 U/L (ref 0–37)
Albumin: 4.1 g/dL (ref 3.5–5.2)
Alkaline Phosphatase: 48 U/L (ref 39–117)
Bilirubin, Direct: 0 mg/dL (ref 0.0–0.3)
Total Bilirubin: 0.5 mg/dL (ref 0.3–1.2)
Total Protein: 7.4 g/dL (ref 6.0–8.3)

## 2010-07-02 LAB — LIPID PANEL: HDL: 50.4 mg/dL (ref >39.00)

## 2010-07-02 LAB — HEMOGLOBIN A1C: Hgb A1c MFr Bld: 8.7 % — ABNORMAL HIGH (ref 4.6–6.5)

## 2010-07-02 MED ORDER — INSULIN ASPART 100 UNIT/ML ~~LOC~~ SOLN: 26.0000 [IU] | Freq: Three times a day (TID) | SUBCUTANEOUS | Status: DC

## 2010-07-02 MED ORDER — INSULIN GLARGINE 100 UNIT/ML ~~LOC~~ SOLN: 80.0000 [IU] | Freq: Every day | SUBCUTANEOUS | Status: DC

## 2010-07-02 NOTE — Progress Notes (Signed)
  Subjective:    Patient ID: Lynn Hobbs, female    DOB: 1960-05-21, 50 y.o.   MRN: [id number]  HPI  50-year-old patient who is seen today for followup. She has type 2 diabetes presently on Lantus 75 units at bedtime and 20 units of NovoLog prior to each meal. Unfortunately she does note home blood sugar monitoring. Her last hemoglobin A1c up to 8.2 She has dyslipidemia and her statin dose has been changed to Pravachol. She is on amlodipine for blood pressure control. No recent lab other than hemoglobin A1c. She has hypertension and cerebrovascular disease. She has chronic left-sided weakness. Her new complaint today is right knee pain that has been a problem for 3 weeks there's been no trauma or effusion. She has had a recent eye exam approximately 1-1/2 months ago that revealed no retinopathy. Wt Readings from Last 3 Encounters:  07/02/10 243 lb (110.224 kg)  03/29/10 248 lb (112.492 kg)  12/24/09 250 lb (113.399 kg)     Review of Systems  HENT: Negative for hearing loss, congestion, sore throat, rhinorrhea, dental problem, sinus pressure and tinnitus.   Eyes: Negative for pain, discharge and visual disturbance.  Respiratory: Negative for cough and shortness of breath.   Cardiovascular: Negative for chest pain, palpitations and leg swelling.  Gastrointestinal: Negative for nausea, vomiting, abdominal pain, diarrhea, constipation, blood in stool and abdominal distention.  Genitourinary: Negative for dysuria, urgency, frequency, hematuria, flank pain, vaginal bleeding, vaginal discharge, difficulty urinating, vaginal pain and pelvic pain.  Musculoskeletal: Positive for gait problem. Negative for joint swelling and arthralgias.  Skin: Negative for rash.  Neurological: Positive for weakness and light-headedness. Negative for dizziness, syncope, speech difficulty, numbness and headaches.  Hematological: Negative for adenopathy.  Psychiatric/Behavioral: Negative for behavioral problems,  dysphoric mood and agitation. The patient is not nervous/anxious.        Objective:   Physical Exam  Constitutional: She is oriented to person, place, and time. She appears well-developed and well-nourished.       Blood pressure 120/74  HENT:  Head: Normocephalic.  Right Ear: External ear normal.  Left Ear: External ear normal.  Mouth/Throat: Oropharynx is clear and moist.  Eyes: Conjunctivae and EOM are normal. Pupils are equal, round, and reactive to light.  Neck: Normal range of motion. Neck supple. No thyromegaly present.  Cardiovascular: Normal rate, regular rhythm, normal heart sounds and intact distal pulses.   Pulmonary/Chest: Effort normal and breath sounds normal.  Abdominal: Soft. Bowel sounds are normal. She exhibits no mass. There is no tenderness.  Musculoskeletal: Normal range of motion.       No inflammatory findings involving the right knee  Lymphadenopathy:    She has no cervical adenopathy.  Neurological: She is alert and oriented to person, place, and time.       Left-sided weakness  Skin: Skin is warm and dry. No rash noted.  Psychiatric: She has a normal mood and affect. Her behavior is normal.          Assessment & Plan:   Diabetes mellitus. Compliance issues stressed we'll check a hemoglobin A1c today Hypertension well controlled Dyslipidemia. We'll check a cholesterol Right knee pain. We'll treat symptomatically and observe

## 2010-07-02 NOTE — Patient Instructions (Signed)
You need to lose weight.  Consider a lower calorie diet and regular exercise.   Please check your hemoglobin A1c every 3 months  Limit your sodium (Salt) intake    It is important that you exercise regularly, at least 20 minutes 3 to 4 times per week.  If you develop chest pain or shortness of breath seek  medical attention.

## 2010-07-02 NOTE — Progress Notes (Signed)
Quick Note:  Spoke with pt about lab and increases in insulins - changes mad to med list and new orders sent to pharmacy ______

## 2010-07-26 NOTE — Discharge Summary (Signed)
Lynn Hobbs, Lynn Hobbs               ACCOUNT NO.:  [id number]   MEDICAL RECORD NO.:  [id number]          PATIENT TYPE:  INP   LOCATION:  2621                         FACILITY:  [facility]   PHYSICIAN:  Pramod P. Pearlean Brownie, MD    DATE OF BIRTH:  09/23/1960   DATE OF ADMISSION:  06/03/2005  DATE OF DISCHARGE:  06/05/2005                                 DISCHARGE SUMMARY   DIAGNOSIS AT TIME OF DISCHARGE:  1.  Acute right thalamic infarct secondary to small vessel disease.  2.  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  3.  Hypertension.  4.  Insulin-dependent diabetes.  5.  Dyslipidemia.  6.  Remote hysterectomy.   MEDICATIONS AT TIME OF DISCHARGE:  1.  Lantus 75  mg at bedtime.  2.  Neurontin 200 mg t.i.d.  3.  Hydrochlorothiazide 25 mg daily.  4.  K-Dur 10 mEq daily.  5.  Labetalol 300 mg b.i.d.  6.  Skelaxin 800 mg daily.  7.  Altace 500 mg at bedtime.  8.  Altace 10 mg in the morning.  9.  Lexapro 20 mg daily.  10. Zocor 40 mg daily.  11. Aspirin 325 mg daily.   STUDIES PERFORMED:  1.  CT of the brain showed no acute abnormality and no intracranial mass      lesions. There is encephalomalacia in the right temporoparietal area      related to previous hemorrhagic infarct. There is ex vacuo dilatation of      the right lateral ventricle. Stable white matter changes on the left      side.  2.  MRI of the brain shows a small right lateral thalamic infarct which is      acute. Old right basal ganglial intracranial hemorrhage.  3.  MRA of the head shows no major stenosis of large or medium size vessels.  4.  Carotid Doppler shows no ICA stenosis.  5.  Echocardiogram shows normal LV function with no embolic source.  6.  EKG shows normal sinus rhythm.   LABORATORY STUDIES:  Hemoglobin A1c of 7.0, cholesterol 269, triglycerides  148, HDL 46, LDL 195. CBC with MCV of 75.1, RDW 16.3, otherwise normal with  differential normal.  Coagulation studies normal. Chemistry with glucose of  114, albumin 3.4, otherwise normal. Liver function tests normal.   HISTORY OF PRESENT ILLNESS:  Lynn Hobbs is a 50 year old right-handed black  female with history of hypertension and intracranial hemorrhage of the right  basal ganglia region in 2005. She had some dysphagia which cleared and had  some minimal residual left hemiparesis. She has poorly hypertension and  poorly controlled diabetes. The patient states she was fine when she went to  bed the night before admission. She said the phone rang and she woke up some  time between 3 and 4 in the morning. She did not answer the phone, but got  up to go to the bathroom which was 3 to 4 feet from her bed. On the way back  to the bed she noted left-sided weakness.  She does not think that it was  there on the way to the bathroom, but that is unclear. She currently has no  speech or language problems, no vision problems, but left hemiparesis. She  was brought to the emergency room and admitted for further stroke evaluation  .   HOSPITAL COURSE:  MRI did reveal an acute new ischemic infarct in an area  adjacent to old hemorrhagic infarct. The patient was not on antiplatelets  prior to admission, so he was started on aspirin 325 mg daily. Her  hemoglobin A1c was elevated which showed ongoing poor control of diabetes.  Her blood pressure on admission was 197/89 and during hospitalization it  came down into 120s over 70s. The patient verbalizes that she follows up  consistently with her primary care physician and takes medications as  directed. She was encouraged to continue doing those things as well as lose  weight and have an exercise program. Her mother was at her bedside, says  they are working on disability for which she has been denied up to this  point. Plans are to continue application for disability process. She was  seen by PT and OT in the hospital who felt she would benefit from  some home  health therapies, but does not require inpatient rehab. At this point she  will be discharged in the care of her family.   PHYSICAL EXAMINATION:  She is awake, alert, oriented. She has no dysarthria.  She has left face asymmetry. She has left hemiparesis with 4/5 strength. She  has decreased sensation on the left and impaired coordination.   DISCHARGE/PLAN:  1.  Discharged home in care of family.  2.  Home health PT and OT.  3.  Follow up with Dr. Gordy Hobbs within one month. Need to insure      medication compliance and risk factor control.  4.  Goal systolic blood pressure less than 130, goal LDL less than 100, and      goal hemoglobin A1c less than 6.5.   FOLLOWUP:  With Dr. Delia Hobbs within two months.      Lynn Hobbs, N.P.    ______________________________  Lynn Hobbs. Pearlean Brownie, MD    SB/MEDQ  D:  06/05/2005  T:  06/06/2005  Job:  [id number]

## 2010-07-26 NOTE — Assessment & Plan Note (Signed)
MEDICAL RECORD NUMBER:  [id number].   Ms. Petkus returns today after I last saw her April 25, 2004. She had a  right subcortical hemorrhage on November 08, 2003. She is doing a home  exercise program with [employer] as well as putty, left upper extremity. She  has still left sided paresthesias, sometimes feels like she is walking on  air on the left foot and some right shoulder numbness as well as popping,  although the popping actually tends to relief the discomfort. She has also  noted some occasional left sided facial twitching which she cannot control.  She states that the side of her mouth actually moves a bit. She has had no  other shaking of the left arm or left leg.   She ambulates with a cane. She drives. She does light house work such as  cooking and cleaning but has not vacuumed yet. She is independent with all  of her activities of daily living.   She states she can walk about 20 minutes and climb steps.   VOCATIONAL:  She last worked November 08, 2003. Date of her stroke.   SOCIAL HISTORY:  Lives with her mother. She is single. She is on long-term  disability through her work.   PHYSICAL EXAMINATION:  Blood pressure 165/96, pulse 90, respirations 20, O2  saturation 100% on room air.   Left upper extremity has 5/5 strength except grip has 4/5 on the left, on  the right side 5/5 throughout. Bilateral lower extremities are 5/5  throughout. She has full range of motion of the left shoulder. No pain to  palpation in the shoulder. No pain with range of motion of the left  shoulder. She has no evidence of facial weakness. Her gait is without toe  drag or knee instability.   IMPRESSION:  1.  Right hemorrhagic cerebral vascular accident causing mild residual      decreased left grip, mild balance deficit, as well as paresthesias.  2.  Postoperative depression. Has had some improvement with Lexapro 10 mg.      Still has some diminished mood and therefore will increase to 20 mg.  3.  Hypertension. She does continue on labetalol, Altace, and      hydrochlorothiazide through her primary, and she will follow up with      him.  4.  Left facial twitching. Unclear to me whether this may represent a small      focal seizure. Have asked her to make an appointment with Dr. Pearlean Brownie to      reevaluate this. I will see her back in about two to three months, and      if she is stable at that time, I plan to see her on a p.r.n. basis.      AEK/MedQ  D:  06/18/2004 10:33:24  T:  06/18/2004 10:59:05  Job #:  [id number]   cc:   Pramod P. Pearlean Brownie, MD  Fax: [phone]   Gordy Savers, M.D. [facility]

## 2010-07-26 NOTE — Discharge Summary (Signed)
Lynn Hobbs, Lynn Hobbs               ACCOUNT NO.:  [id number]   MEDICAL RECORD NO.:  [id number]          PATIENT TYPE:  IPS   LOCATION:  4030                         FACILITY:  [facility]   PHYSICIAN:  Erick Colace, M.D.DATE OF BIRTH:  06/22/1960   DATE OF ADMISSION:  11/16/2003  DATE OF DISCHARGE:  12/11/2003                                 DISCHARGE SUMMARY   DISCHARGE DIAGNOSES:  1.  Right intracranial hemorrhage with left hemiparesis.  2.  Dysphagia secondary to #1, improved.  3.  Pulsatile stockings for deep vein thrombosis prophylaxis.  4.  Hypertension.  5.  Insulin dependent diabetes mellitus with neuropathy.  6.  Hyperlipidemia.   HPI:  A 50 year old black female history of hypertension, insulin-dependent  diabetes mellitus, transient ischemic attack admitted November 08, 2003, with  headache, left sided weakness, blood pressure diastolic 100, systolic 195,  glucose 411.  Cranial CT scan with large 4 x 5 cm acute hemorrhagic right  lentiform nucleus.  Neurology consult advised conservative care, blood  pressures monitored with medications titrated.  Initially with nasogastric  tube feeds, swallow study November 15, 2003, placed on a dysphagia-2 honey  thick liquids, nasogastric tube discontinued November 16, 2003.  Treated  with Cipro November 15, 2003, x7 days for a Klebsiella urinary tract  infection.  Blood sugars continued to have elevations 280, 283, 247 with her  Lantus insulin adjusted.  She was admitted for a comprehensive rehab  program.   PAST MEDICAL HISTORY:  See discharge diagnoses.   ALLERGIES:  NONE.   Denies alcohol or tobacco.   MEDICATIONS PRIOR TO ADMISSION:  1.  Lantus insulin 50 units at bedtime.  2.  Hydrochlorothiazide 25 mg daily.  3.  Altace 10 mg daily.  4.  Celebrex 200 mg daily.   SOCIAL HISTORY:  Lives with mother in [place].  Employed as a Corporate investment banker.  One level home.  Three steps to entry.  Mother and local family  can  assist on discharge.   HOSPITAL COURSE:  Patient with progressive gains while on rehab services  with therapies initiated in a b.i.d. basis.  The following issues are  followed during the patient's rehab course.  Pertaining to Lynn Hobbs's  right ICH, continued to progress nicely.  Conservative care was advised per  neurology services, Dr. Pearlean Brownie.  Her left sided weakness continued to improve  dramatically.  Family teaching was ongoing and completed prior to discharge.  Left upper extremity grossly graded 3- 3/5 to the biceps, triceps and  deltoids, 3-/5 left lower extremity hip flexors and knee extension.  Followed by speech therapy for dysphagia.  Her diet had since been advanced  to a mechanical soft with thin liquids with good overall intake.  Blood  sugars early on with increased variables.  Latest blood sugars 167, 226 and  156.  Novolog had been added 5 units 3 times daily to her regimen as well as  her Lantus insulin since being increased to 85 units at bedtime.  Blood  pressures controlled with labetalol, Altace and hydrochlorothiazide.  PAS  stockings were used for deep vein  thrombosis prophylaxis.  Her calves remain  cool without any swelling, erythema and nontender.  During her rehab course,  Ritalin was added to her regimen to help overall attention and focus,  adjusted accordingly 5 units twice daily, she will continue on this until  followup with Dr. Wynn Banker.   Overall, for her functional ability she was supervision toilet transfers,  min assist for toileting, minimal assist for ambulation with a straight  point cane 175 feet.  Family teaching was completed with her mother.  Day  pass went well.  She was discharged home on home health therapies.   LATEST LABS:  Showed hemoglobin 12.8, hematocrit 37.9, sodium 138, potassium  3.6, BUN 13, creatinine 1.2.   DISCHARGE MEDICATIONS INCLUDED:  1.  Labetalol 300 mg twice daily.  2.  Altace 10 mg daily.  3.  Zocor 20 mg  daily.  4.  Protonix 40 mg daily.  5.  Ritalin 2.5 mg at 8 a.m. and 12 noon.  6.  Novolog insulin 5 units three times daily.  7.  Hydrochlorothiazide 25 mg daily.  8.  Potassium chloride 20 mEq daily.  9.  Altace 10 mg in the a.m., 5 mg in the evening.  10. Lantus insulin 85 units at bedtime.  11. Neurontin 200 mg three times daily.   ACTIVITY:  As tolerated with assistance.   DIET:  Diabetic diet mechanical soft.   SPECIAL INSTRUCTIONS:  Home health, physical and occupational therapy.  She  would followup with Dr. Claudette Laws at [facility] outpatient rehab  center as advised; Dr. Pearlean Brownie, neurology services; Dr. Drue Novel, medical  management.       DA/MEDQ  D:  12/11/2003  T:  12/11/2003  Job:  [id number]   cc:   Erick Colace, M.D.  [address]  [place] [address]  Fax: [phone]   Pramod P. Pearlean Brownie, MD  Fax: [phone]   Wanda Plump, MD [facility]  [phone] [place], [place] [address]

## 2010-07-26 NOTE — Letter (Signed)
March 23, 2006     RE:  [name], [name]  MRN:  [id number]  /  DOB:  12/10/1960   To whom it may concern:   [name] is a patient followed in our internal medicine  practice for a number of years.  She has multiple medical problems  including multidrug-resistant hypertension, extensive cerebrovascular  disease, diabetes, and hyperlipidemia.  On November 08, 2003, she was  admitted to the hospital for a right brain intracranial hemorrhage  secondary to hypertension.  A CT of the head confirmed a large right  basal ganglia hemorrhage and the patient was admitted to the ICU and  seen in consultation by the neurology service.  She required aggressive  treatment of her blood pressure as well as aggressive management of her  diabetes.  Following this acute stroke she required extensive rehab,  with some clinical improvement.  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  After stabilization and inpatient physical therapy, she  was discharged home to the care of her family and required home health  PT and OT.   At the present time, she requires multiple medications for treatment of  her resistant hypertension as well as aggressive management of her  diabetes.  She is on multiple daily injections of insulin to attain  glycemic control.  She has considerable residuals from her prior strokes  and has a chronic left hemiparesis.  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  She  cannot persist in participation of normal activities for more than 20  minutes without a rest break.  She requires treatment with multiple  medications and close medical followup.  Diabetic complications include  peripheral neuropathy as well as a history of a cranial nerve palsy.  The patient is ambulatory but walks with a guarded, unsteady gait and  requires a cane for  assistance and to avoid falls.  Her post stroke  depression is manifested by a generally depressed mood with apathy and  general psychomotor retardation.  Psychotherapeutic medication has been  recommended but unfortunately the patient cannot afford due to lack of  health insurance.  Since her second stroke, she remains quite anxious  and fears recurrent insults.  Psychological evaluation has revealed a  full scale IQ of 72, placing her within a borderline intellectual range.  She remains socially withdrawn and exhibits significant cognitive  dysfunction with inability to concentrate.  In my opinion, she could not  cope with even a low-stress competitive work environment and would be  markedly limited in her ability to perform at a consistent pace.   I hope the above information is helpful.    Sincerely,      Gordy Savers, MD  Electronically Signed    PFK/MedQ  DD: 03/23/2006  DT: 03/24/2006  Job #: [id number]

## 2010-07-26 NOTE — H&P (Signed)
NAMEJAMAIYA, TUNNELL               ACCOUNT NO.:  [id number]   MEDICAL RECORD NO.:  [id number]          PATIENT TYPE:  EMS   LOCATION:  MAJO                         FACILITY:  [facility]   PHYSICIAN:  Gustavus Messing. Orlin Hilding, M.D.DATE OF BIRTH:  [birth year]   DATE OF ADMISSION:  06/03/2005  DATE OF DISCHARGE:                                HISTORY & PHYSICAL   This is a code stroke called at 5:48.  The symptom time of onset is  unreliable.   CHIEF COMPLAINT:  Left arm and leg weakness.   HISTORY OF PRESENT ILLNESS:  Ms. Rotundo is a 50 year old right-handed black  woman with a history of hypertensive hemorrhage in the right basal ganglia  region in 2005.  She had some dysphagia which cleared and minimal residual  left hemiparesis.  She has poorly controlled hypertension and insulin-  dependent diabetes.  She said she was fine when she went to bed last night  around 10 p.m.  She said the phone rang and woke her up some time between 3  and 4 in the morning.  She did not answer the phone but she got up to go to  the bathroom which was 3-4 feet from her bed.  On the way back she noted  left-sided weakness.  She does not think it was there on the way to the  bathroom but that is unclear.  She has had no speech or language problems,  no vision problems.   REVIEW OF SYSTEMS:  Mild headache.  No chest pain.  No shortness of breath.   PAST MEDICAL HISTORY:  1.  Right temporal basal ganglia insular hemorrhage secondary to      hypertension in 2005.  2.  Hypertension.  3.  Insulin-dependent diabetes.  4.  Dyslipidemia.  5.  Remote hysterectomy.   MEDICATIONS:  1.  Lantus insulin 25 q.h.s.  2.  NovoLog insulin on a sliding scale t.i.d.  3.  Gabapentin 100 mg two tablets two times a day.  4.  Hydrochlorothiazide 25 mg once a day.  5.  Potassium 10 mEq two tablets once a day.  6.  Labetalol 300 mg twice a day.  7.  Altace 5 mg once a day.  8.  Skelaxin 800 mg q.h.s.  9.  Lexapro 10 mg once a  day.  10. Zocor 20 mg once a day.   ALLERGIES:  No known drug allergies.   SOCIAL HISTORY:  She does not smoke.   FAMILY HISTORY:  Positive for a stroke and diabetes.   PHYSICAL EXAMINATION:  VITAL SIGNS:  Temperature 98, pulse 92, blood  pressure 197/89, respirations 22, 100% saturation.  HEENT:  Head is normocephalic, atraumatic.  NECK:  Supple without bruits.  HEART:  Regular rate and rhythm.  ABDOMEN:  She is obese.  EXTREMITIES:  Without edema.  NEUROLOGIC:  On the NIHS stroke scale she scores 2 for a drift in the left  upper extremity and decreased sensation subjectively in the left lower  extremity.  1a she is alert; she gets a 0.  1b she answered 2/2 answers  correctly, gets a  0.  1c she follows two commands correctly, gets a 0.  2  she has a full gaze, gets a 0.  3 visual fields are full, gets a 0.  4 there  is no facial droop; she gets a 0.  5a she does have drift of the left upper  extremity, but not before counting to 10 so she gets a 1.  5b right upper  extremity, 6a left lower extremity and 6b right lower extremity are all  normal.  No drift; she gets 0 for those.  7 no ataxia.  8 decreased sensory  subjectively in the left lower extremity; she gets a 1.  9 no aphasia; she  gets 0.  10 no dysarthria, gets a 0.  11 no neglect; she gets a 0 so her  total score is 2.  There is weakness in the left lower extremity, however,  at 4-4+/5 but no drift is noted.  10 the deep tendon reflexes are absent.  She has an upgoing toe on the left.  Had increased tone in the left upper  extremity with a somewhat cortical thumb and a lot of clumsiness there.   CT shows old changes in the right temporal insular basal ganglia region  consistent with her old large hemorrhage, nothing acute.  CMET is fairly  unremarkable.  Her glucose is 114.  CBC and remaining laboratories are  pending.   IMPRESSION:  Increasing left-sided weakness in a patient with previous  hypertensive hemorrhage in  the right brain in 2005 with minimal residual  left hemiparesis.  She scores 2 on the NIH stroke scale.  The time of onset  is somewhat unreliable.  For these reasons she is not a TPA or study  candidate.  She also has hypertension.   PLAN:  Admit to stroke service.  She needs an MRI and MRA, carotid Doppler,  and 2-D echocardiogram.  Need to follow blood pressure.  Consider aspirin,  although with a previous history of hypertensive hemorrhage and current  uncontrolled blood pressure there is concern for bleeding.  Stroke service  will follow.      Catherine A. Orlin Hilding, M.D.  Electronically Signed     CAW/MEDQ  D:  06/03/2005  T:  06/04/2005  Job:  [id number]

## 2010-07-26 NOTE — Consult Note (Signed)
NAME:  Lynn Hobbs NO.:  [id number]   MEDICAL RECORD NO.:  [id number]                   PATIENT TYPE:  INP   LOCATION:  1823                                 FACILITY:  [facility]   PHYSICIAN:  Wanda Plump, MD [facility]                 DATE OF BIRTH:  08/25/1960   DATE OF CONSULTATION:  11/09/2003  DATE OF DISCHARGE:                                   CONSULTATION   Lynn Hobbs is a 50 year old black female who I have been called to help with  her diabetes management.  Lynn Hobbs was found today on the floor by her  mother and at that time the mother reports that the patient was alert and  oriented.  They did notice left sciatic weakness.  An ambulance was called  and at the ER, she was found to have sizeable right sided intracranial  hemorrhage.  Dr. Thad Ranger was consulted and she is undergoing evaluation and  treatment per the neurology team.  The patient reports to me that she has  been having some headaches and tremors on the left side for a few days  and, at this point, she is mostly complaining of headaches.  She also states  that her blood sugars have been in the range of around 170 at home and she  had good compliance with her medical regimen.   PAST MEDICAL HISTORY:  1.  Hypertension for many years.  2.  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  3.  Status post hysterectomy due to fibroid tumors and she is G0, P0.  4.  She reports mini-strokes in the past.   FAMILY HISTORY:  Mother, father, and a sibling have hypertension.  An aunt  has diabetes and also history of a stroke.   SOCIAL HISTORY:  Does not smoke or drink.   REVIEW OF SYMPTOMS:  The patient denies any fevers, chills, cough, fever, no  nausea, vomiting, diarrhea, or abdominal pain, no chest pain or shortness of  breath, no lower extremity edema.   MEDICATIONS:  1.  Lantus 50 units a day.  2.  Humalog  sliding scale.  3.  HCTZ 25 mg daily.  4.  Altace 10 mg 1 p.o. daily.  5.  Neurontin 300 mg 1 p.o. b.i.d.  6.  Celebrex 200 1 p.o. daily.  7.  Aspirin once a day according to the mother.   ALLERGIES:  No known drug allergies.   PHYSICAL EXAMINATION:  GENERAL:  The patient is sleepy but easily arousable.  She is alert,  oriented, coherent, cooperative, and she recalls all the events of tonight.  LUNGS:  She has a few rhonchi scattered in both fields.  CARDIOVASCULAR:  Regular rate and rhythm without a murmur.  ABDOMEN:  Nondistended, good bowel sounds.  EXTREMITIES:  No edema.  NEUROLOGICAL:  Her speech is clear.  She does have left sided upper and  lower extremity weakness as well as left sided facial weakness.   LABORATORY AND X-RAY DATA:  Initial blood sugar was 424, after 10 units of  IV insulin, it dropped to 323.  Hemoglobin 14.2, white count 13.2, platelets  214.  EKG showed normal sinus rhythm.  Head CT showed intracranial bleed.   ASSESSMENT AND PLAN:  1.  Diabetes.  For tonight, we will place the patient on Glucometer, will      also check A1C, micro and serum ketones.  Will do a fasting lipid      profile in the morning.  The patient is n.p.o. and we will probably have      to start her on Lantus again in the next 24 hours.  2.  Hypertension.  Will continue with the orders per the neurology team and      will start her on ACE inhibitors in the following hours.  At this point,      the comprehensive panel is pending.                                               Wanda Plump, MD [facility]    JEP/MEDQ  D:  11/09/2003  T:  11/09/2003  Job:  [id number]

## 2010-07-26 NOTE — H&P (Signed)
NAME:  Lynn Hobbs, Lynn Hobbs                         ACCOUNT NO.:  [id number]   MEDICAL RECORD NO.:  [id number]                   PATIENT TYPE:  INP   LOCATION:  1823                                 FACILITY:  [facility]   PHYSICIAN:  Casimiro Needle L. Thad Ranger, M.D.           DATE OF BIRTH:  January 10, 1961   DATE OF ADMISSION:  11/08/2003  DATE OF DISCHARGE:                                HISTORY & PHYSICAL   CHIEF COMPLAINT:  Left sided weakness.   HISTORY OF PRESENT ILLNESS:  This is the initial [facility]  admission for this 50 year old woman with a past medical history which  includes hypertension, diabetes, and a previous mini-stroke.  The patient  was feeling well earlier today; however, during the afternoon she complained  of a dull headache.  She took a nap around 6 p.m. and awoke around 7 p.m.  unable to walk and weak on her left side which was noticed by her family.  EMS was alerted.  Blood glucose was reportedly 411 on the scene.  She was  brought to the [facility] Emergency Room where she was found to have  elevated blood pressures in the 195/100 range and a CBG of 425.  Her  deficits were stable, but she did have some projectile vomiting.  She was  taken to CT which demonstrated a intracerebral hemorrhage and neurologic  consultation is thus requested for admission.  The patient is presently  awake and complains of an ongoing headache.  She has not had symptoms like  this before.   PAST MEDICAL HISTORY:  1.  Hypertension.  2.  Insulin-requiring-diabetes.  3.  Diabetic neuropathy for which she takes Neurontin and Celebrex.  4.  She was having one previous stroke in the past which was a mini-stroke      which caused dysarthria and right sided numbness and for which she was      not hospitalized.  5.  She also had some sort of second event which might have been a stroke      which also left her with some muscle problem in the right eye.  She      saw Dr. Alton Revere in [facility]  about this.   PRIMARY CARE PHYSICIAN:  __________  .   FAMILY HISTORY:  Remarkable for stroke in a maternal uncle and several  others.   SOCIAL HISTORY:  She does not smoke or consume alcohol.  She is normally  independent in her activities of daily living and does work.  Her family  says she has a stressful job.   ALLERGIES:  No known drug allergies.   MEDICATIONS:  1.  Lantus 50 units q.h.s.  2.  Novolog sliding scale.  3.  HCTZ 25 mg every day.  4.  Altace 10 mg every day.  5.  Neurontin 300 mg t.i.d.  6.  Celebrex 200 mg every day.   REVIEW OF SYSTEMS:  Full ten system review of systems is negative, except  for the HPI and admission ER and nursing records.   PHYSICAL EXAMINATION:  VITAL SIGNS:  Temperature 98.1, blood pressure  195/98, down to 172/81 after labetalol 20 mg, pulse of 90, respirations 20.  GENERAL:  This is an obese very sleepy woman, supine in the hospital bed, in  no evident distress.  She alerts to voice and is oriented to time and place.  She is able to follow one and two-step commands.  HEAD:  Cranium is normocephalic and atraumatic.  Oropharynx is benign.  NECK:  Supple without carotid bruits.  CHEST:  Clear to auscultation.  HEART:  Regular rate and rhythm without murmurs.  ABDOMEN:  Obese, soft.  Normoactive bowel sounds.  EXTREMITIES:  No edema.  NEUROLOGIC:  Cranial nerves:  Pupils are 3-mm, briskly reactive to two.  Eyes are deviated firmly to the right.  She does not blink to threat from  the left.  A left facial droop is present.  Gag reflex is present.  Motor:  There is no spontaneous movement in the left upper or lower extremities.  There is increased tone in the left upper extremity.  Sensation:  She is  able to perceive pain in the left upper and lower extremities.  There is no  withdrawal to pain in the upper extremity and a triple response elicited by  pain in the lower extremity on the left.  Right is normal.  Babinski sign is  present  on the left.   LABORATORY REVIEW:  CBC, C-MET, coags are pending at the time of dictation.   CT of the head demonstrates a large (4 x 5-cm) acute hemorrhage in the right  lentiform nucleus stretching medially and inferiorly into the thalamus.  There is minimal extension into the right lateral ventricle and less than 5-  mm of midline shift.   IMPRESSION:  1.  Right brain intracerebral hemorrhage.  2.  Hypertension.  3.  Uncontrolled diabetes.   PLAN:  We will admit to the ICU.  We will place on labetalol drip for blood  pressure control.  We will treat nausea and pain symptomatically and  headache symptomatically.  We will also ask for internal medicine assistance  with managing her diabetes.  I have called Dr. Drue Novel who is the [facility]  physician on call this evening who will come in and assist with her blood  sugar management.  Stroke service to follow.                                                Michael L. Thad Ranger, M.D.    MLR/MEDQ  D:  11/09/2003  T:  11/09/2003  Job:  [id number]

## 2010-07-26 NOTE — Assessment & Plan Note (Signed)
DATE OF SERVICE:  March 26, 2004.   MEDICAL RECORD NUMBER:  [id number].   DATE OF BIRTH:  Oct 27, 1960.   Ms. Morea returns today after I last saw her on February 27, 2004.  She has  a history of right subcortical hemorrhage on November 08, 2003.  She has  finished out PT and has started OT.  She is driving.  She feels tired and  painful all over.  She does not feel like going out to the mall any more or  doing much of anything.  She describes as feeling down with poor sleep.   She has had no new medical issues since I last saw her.  She sees Dr.  Amador Cunas.   SOCIAL HISTORY:  Has not worked since November 08, 2003.  She is pursuing long-  term disability through her company.  She did satisfy the requirements for  her short term.   REVIEW OF SYSTEMS:  Positive for poor sleep.  Otherwise see review.   FUNCTIONAL STATUS:  She is independent with all of her self-care mobility.  She uses a cane outside the house.   PHYSICAL EXAMINATION:  Blood pressure 164/94, pulse 79, O2 saturation 100%  on room air.  Gait is using a cane.  Affect is alert.  Appearance is normal.  She is obese.  Motor strength is 4+/5 in the left deltoid, biceps and grip,  5/5 in the left lower extremity and 5/5 in the right upper and right lower  extremities.  She has mild dexterity loss in left upper extremity in finger  to thumb apposition, as well as mild dysdiadochokinesia on supination and  pronation on the left side versus the right side.   IMPRESSION:  1.  Right hemorrhagic cerebrovascular accident causing mild residual left      hemiparesis and loss of fine motor and dexterity.  I think she should      continue to make good progress.  I think she can go to OT one more time      and they can give her a home exercise program which she can continue on      her own.  2.  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  She will follow up with the primary care physician,  looking if      there are any other medical issues related to her overall complaints.      Her blood pressure medications certainly can be adjusted.  Of note is      that the patient had been on Ritalin for post stroke depression and      improving her initiation.  Should the Lexapro not work, would try the      Ritalin again, although this may have a greater hypertensive effect.      AEK/MedQ  D:  03/26/2004 12:57:20  T:  03/26/2004 13:21:58  Job #:  [id number]   cc:   Gordy Savers, M.D. [facility]

## 2010-07-26 NOTE — Assessment & Plan Note (Signed)
DATE OF SERVICE:  February 27, 2004.   DATE OF BIRTH:  August 18, 1960.   MEDICAL RECORD NUMBER:  Is #[id number].   [name] returns today after I last saw her, on January 05, 2004, which  was a follow-up visit, following her discharge from [facility] on  December 11, 2003.  She has a history of a large right lentiform nucleus  hemorrhage 4 x 5-cm in August.  Onset of her stroke was November 08, 2003.  She has remained out of work since that time.  She has started physical  therapy again, now through outpatient and measure grip strength on the right  at 78 pounds, on the left 12 pounds.  She has fairly good dexterity per PT  in the left upper extremity.  She is independent with all of her self care  and mobility skills.  She has started driving again.   PHYSICAL EXAMINATION:  GENERAL:  No acute distress, mood and affect  appropriate.  MUSCULOSKELETAL:  Her motor strength is 4/5 in the left deltoid biceps  triceps grip as well as the left hip flexion, knee extension, ankle  dorsiflexion on the right side at 5/5.  She has good sensation bilateral  upper and lower extremities.  She has mildly reduced finger-to-thumb  opposition on the left compared to the right side and mildly reduced rapid  alternating movements in the left hand supination pronation, compared to the  right hand.  Gait is normal.  She is able to do heel walking, toe walking,  and tandem gait.   IMPRESSION:  Left hand and upper extremity weakness as well as mild loss of  fine motor and reduction of dexterity left upper extremity as described  above secondary to a right hemorrhagic cerebrovascular accident.   She has overall had an excellent recovery and I think that after another  three weeks of therapy focusing on typing and strengthening of left upper  extremity, she should be able to go back to work.  She has started working  with vocational rehab and I think that is a good idea to coordinate with  them because  I think she will still have some mild deficits in terms of hand  dexterity as well as decreased endurance left upper extremity overall, but I  think she will be able to return to work without restrictions.      Andr   AEK/MedQ  D:  02/27/2004 13:24:31  T:  02/27/2004 17:12:56  Job #:  [id number]   cc:   Hand Specialist   Angelica Chessman, M.D.

## 2010-07-26 NOTE — Assessment & Plan Note (Signed)
MEDICAL RECORD NUMBER:  [id number]   INTERVAL HISTORY:  [name] returns today after I last saw her March 26, 2004.  She had a history of right subcortical hemorrhage on November 08, 2003.  She is no longer getting any formalized physical or occupational therapy.  She is doing a home exercise program.  She is driving.  Her sleep has  improved since I last saw her.  Last visit we did start her on Lexapro.   SOCIAL HISTORY:  Has not worked since November 08, 2003.  She does not feel  like she can return to work at this time.  Lives with her mother, single,  nonsmoker.   REVIEW OF SYSTEMS:  Positive for CVA.   EXAMINATION:  Blood pressure 142/67, pulse 83, respiratory rate 16, O2  saturation 100% room air.  Gait is using a cane; she can go without it for  short distances.  She has no foot drag or knee instability.  She has 5/5  strength bilateral upper and lower extremities.  She has decreased foot  tapping on the left side and also some dysdiadochokinesis on rapid  alternating movements of the left forearm.   IMPRESSION:  1.  Right hemorrhagic cerebrovascular accident causing mild residual left      hand and foot incoordination.  2.  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   PLAN:  1.  I will see her back in 2 months.  2.  Advised to continue home exercise program.  3.  Consider increasing Lexapro dosage to 20.      AEK/MedQ  D:  04/25/2004 16:11:59  T:  04/25/2004 18:58:06  Job #:  [id number]   cc:   Gordy Savers, M.D. [facility]

## 2010-07-26 NOTE — Op Note (Signed)
NAMEKERRY, Lynn Hobbs               ACCOUNT NO.:  [id number]   MEDICAL RECORD NO.:  [id number]          PATIENT TYPE:  AMB   LOCATION:  DSC                          FACILITY:  [facility]   PHYSICIAN:  Mary Contogiannis, M.D.DATE OF BIRTH:  1960/12/30   DATE OF PROCEDURE:  07/25/2004  DATE OF DISCHARGE:                                 OPERATIVE REPORT   PREOPERATIVE DIAGNOSIS:  Dumbell keloid, left earlobe.   POSTOPERATIVE DIAGNOSIS:  Dumbell keloid, left earlobe.   OPERATION PERFORMED:  1.  Excision of 2.6 cm posterior left earlobe keloid.  2.  Excision of 2.0 cm anterior left earlobe keloid.   SURGEON:  Brantley Persons, M.D.   ANESTHESIA:  1% lidocaine with epinephrine.   COMPLICATIONS:  None.   INDICATIONS FOR PROCEDURE:  The patient is a 50 year old African-American  female who presents with an anterior and posterior keloid of the left ear  lobe.  This is classified as a dumbbell type of keloid.  She presents to  undergo excision of the keloid.   DESCRIPTION OF PROCEDURE:  The patient was brought to the minor room and  placed on the table in the supine position.  She was then placed in the  lateral decubitus position with the left side up.  The left ear and face  were prepped with Betadine and draped in sterile fashion.  The skin and  subcutaneous tissues of the left earlobe were then injected with 1%  lidocaine with epinephrine.  After adequate hemostasis and anesthesia had  taken effect, the procedure was begun.   First the anterior earlobe keloid was excised.  This was sharply excised at  the base of the keloid.  The keloid was a through-and-through keloid, so it  was excised into the substance of the earlobe.  Next, the posterior keloid  was then excised from the posterior surface of the left earlobe.  The  anterior keloid that was excised measured 2.0 cm and the posterior keloid  was 2.6 cm.  There was actually some durable earlobe tissue that remained.  I then  proceeded with closure of the two incisions.  First the anterior  incision was closed.  This was closed with 6-0 Prolene in a running mattress  type suture.  Next the posterior incision was also closed with a 6-0 Prolene  suture in a running mattress type suture.  Thge incision was then dressed  with bacitracin ointment.  There were no complications.  The patient  tolerated the procedure well. She was then given proper postoperative wound  care instructions and discharged home in stable condition.  Follow-up  appointment will be tomorrow in the office.    MC/MEDQ  D:  07/25/2004  T:  07/25/2004  Job:  [id number]

## 2010-07-26 NOTE — Discharge Summary (Signed)
NAME:  Lynn Hobbs, Lynn Hobbs                         ACCOUNT NO.:  [id number]   MEDICAL RECORD NO.:  [id number]                   PATIENT TYPE:  INP   LOCATION:  3024                                 FACILITY:  [facility]   PHYSICIAN:  Pramod P. Pearlean Brownie, MD                 DATE OF BIRTH:  08/23/1960   DATE OF ADMISSION:  11/08/2003  DATE OF DISCHARGE:  11/16/2003                                 DISCHARGE SUMMARY   DIAGNOSES AT TIME OF DISCHARGE:  1.  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  2.  Hypertension.  3.  Diabetes.  4.  Dysphagia secondary to #1 improving.  5.  Dyslipidemia.  6.  Urinary tract infection with Klebsiella.   MEDICINES AT TIME OF DISCHARGE:  1.  Altace 10 mg daily.  2.  Neurontin 300 mg t.i.d.  3.  Zocor 20 mg daily,  4.  Prevacid 30 mg daily,  5.  Labetalol 300 mg b.i.d.  6.  Cipro 500 mg q.12 h. starting September 7th for a total of 7 days.  7.  Hydrochlorothiazide 50 mg a day.  8.  Potassium 40 mEq t.i.d.  9.  Lantus 50 mg q.h.s.   STUDIES PERFORMED:  1.  CT of the head on admission shows a large right basal ganglia      intracerebral hemorrhage with possible intraventricular extension and 4      mm right-to-left midline shift.  2.  Follow up CT at 24 hours showed no significant change.  3.  Chest x-ray showed low volume with vascular crowding and atelectasis, no      definite infiltrates or edema.  4.  CT on September 5th showed stable to minimal increase in left      ventricular size, otherwise stable right basal ganglia intracranial      hemorrhage, mass effect, and right-to-left midline shift.  5.  EKG showed normal sinus rhythm with prolonged QT.   LABORATORY STUDIES:  Urine culture positive 100,000 colonies Klebsiella  pneumonia.  CBC with stable hemoglobin, white blood cells as high as 17.2  down to 13.5, red blood cells 5.15, MCV 74.6, MCHC 33.4, RDW 13.7, platelets  203.  Chemistry with sodium 133, potassium 3.9, chloride 95, CO2 28, glucose  is elevated 236 and 399, BUN 13, creatinine 0.9, calcium 9.1, total protein  6.7 and albumin 2.8.  Liver function tests normal.  Hemoglobin A1C 13.3.  Cholesterol 266, triglycerides 91, HDL 51, and LDL 197.  Micro albumin 33.7.  Hemoglobin negative in her urine.   HISTORY OF PRESENT ILLNESS:  [name] is a 50 year old black female  with a Past Medical History which includes hypertension, diabetes and  previous mini strokes.  She was feeling well the day of admission until  early in the afternoon when she complained of a dull headache.  She took a  nap around 6 p.m. and awoke at 7 p.m. unable to  walk and weak on her left  side.  EMS was called.  On the scene, her glucose was 411.  She was brought  to the [facility] Emergency Room where she was found to have a blood  pressure 195/100 and CBG of 425.  Her deficits were stable but she did have  some projectile vomiting.  CT of the brain demonstrated intracerebral  hemorrhage of the basal ganglia.  Neurosurgery consult was obtained.  She  was admitted for further evaluation.   HOSPITAL COURSE:  The patient was admitted to the ICU on labetalol drip for  blood pressure control.  Nausea and pain symptoms were treated  symptomatically.  Neurosurgery followed at a distance and Dr. Drue Novel from  [place], who was on call that evening, came in to assist with her glucose  management.  She was put on a Glucommander and insulin drip which brought  her glucoses down.  Initially, she did not pass a swallow evaluation and the  Panda was placed with tube feedings started.  On Glucommander, the patient  required high insulin rate.  She was eventually transitioned to Lantus and  sliding scale insulin.  PT and OT evaluated her and she was felt to be a  rehab candidate.  She initially did have low participation due to sleepiness  and apathy but that has improved during hospitalization and she is now ready  for rehab.   Also during hospitalization, she was  found to have elevated cholesterol for  which she was started on a statin.  Blood pressure remained elevated and  meds have been adjusted.  Recent increase in hydrochlorothiazide.  Will need  to monitor on rehab closely.  Consider increasing Altace versus labetalol.  Wonder if we need to look at renal artery stenosis.  Will continue to  titrate Lantus up as needed to keep glucoses in the normal range.  She has a  urinary tract infection and she is put on Cipro for that.  She will need  that for a total of 7 days.  After a week in the hospital, repeat swallow  evaluation she passed with a dysphagia II honey-thick diet and the Panda was  discontinued as were her tube feedings.   CONDITION ON DISCHARGE:  The patient alert and oriented x3.  Dysarthric.  No  aphasia.  She has left lower facial weakness and some left-sided neglect.  Left hemiparesis 3/5, can move her arm in the plane, grips 3/5.  Her arm and  leg is ataxic.  She is able to hold her leg up so she has 4/5 in her hip and  probably 3 to 5 in her distal.  Strength on the right is okay.  Her chest is  clear to auscultation.  Her heart rate is regular.  Her visual fields are  full though she does have a right gaze preference but can cross the midline.  She is slightly despondent but more animated today.   DISCHARGE PLAN:  1.  Transfer to rehab for continuation of therapy.  2.  Cipro for 7 days for UTI coverage.  3.  Monitor blood pressure.  Consider increasing Altace versus labetalol.  4.  Dysphagia--monitor p.o. intake.  5.  Follow up with Dr. Delia Heady in 2 months after discharge from rehab.      [name], N.P.                         Pramod P. Pearlean Brownie, MD  SB/MEDQ  D:  11/16/2003  T:  11/16/2003  Job:  [id number]   cc:   Wanda Plump, MD [facility]  [phone] [place], [place] [address]

## 2010-07-26 NOTE — Assessment & Plan Note (Signed)
REASON FOR VISIT:  Ms. Melnik returns today after I last saw her on June 18, 2004.  At that time, she was having some problems with sleep and  diminished mood.  We increased her Lexapro and this improved her sleep.  She  also had some facial twitching on the left.  She contacted Dr. Pearlean Brownie and  they recommended increasing Neurontin and this has helped with those  symptoms.  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  She is independent with all of her activities of daily living.  Mother feels that she sometimes needs help with making decisions, but this  is not consistent.   She can walk for about 5 minutes straight.  Date of last work was November 08, 2003, which is the date of her stroke.   SOCIAL HISTORY:  Lives with her mother.  She is single.  On long-term  Disability through work.   PHYSICAL EXAMINATION:  VITAL SIGNS:  Blood pressure 125/68, pulse 84,  respirations 16, O2 saturations 99% on room air.  NEUROLOGIC:  Left upper extremity strength is 4/5 grip on the left side,  otherwise 5/5 on the left.  Right side is 5/5 in lower extremity.  Right  lower extremity is 5/5, left lower extremity is 5-/5.  Full range of motion  at the shoulder.  No pain to palpation at shoulder.  The knee with reduced  sensation on the left side compared to the right side.  She has no evidence  of spasticity.  Normal deep tendon reflexes.   IMPRESSION:  1.  Right hemorrhagic cerebrovascular accident causing mild residual left      grip, mild balance deficit.  Still occasionally uses cane as well as      paresthesias.  2.  Post stroke depression improved on Lexapro 20 mg q.d.  3.  Hypertension being managed by primary care.  4.  Left facial twitching, question focal seizure, improved on increased      Neurontin.   PLAN:  1.  I think overall she has reached her maximum improvement and needs to      continue with her home exercise program as she is doing.  2.  She does  complain of some spasm at night in her left lower extremity and      for that reason have given her some samples of Zanaflex 4 mg capsules      q.h.s.  She can call back if these are helpful and we can call in some      Zanaflex for q.h.s. tablet or capsule.  3.  She can follow up medically with Dr. Amador Cunas and from a neurologic      basis, Dr. Pearlean Brownie.  I will see her back on a p.r.n. basis especially      should her spasms increase.       AEK/MedQ  D:  09/17/2004 10:07:08  T:  09/17/2004 10:56:16  Job #:  [id number]   cc:   Pramod P. Pearlean Brownie, MD  Fax: [phone]   Gordy Savers, M.D. [facility]

## 2010-07-26 NOTE — Letter (Signed)
November 14, 2005     No[employer] [place] [employer] and Training and development officer and Appeal Section  [address] [place], [place] [address]   Attn:  Doyce Loose - Hearing Officer   RE:  Lynn Hobbs, Lynn Hobbs  MRN:  [id number]  /  DOB:  04/18/1960   Dear Milford Cage,   [name] is a patient followed in our internal medicine practice  for a number of years.  She has a history of extensive cerebrovascular  disease including a right intracranial hemorrhage and a chronic left  hemiparesis.  She has had some difficulty with cognitive dysfunction,  dysphagia which has improved.  She has multiple medical problems including  hypertension, and insulin-dependent diabetes, as well as hyperlipidemia.  Diabetic complications include a peripheral neuropathy.  She is followed  closely by [facility].  At the present time, she still remains quite  functionally impaired.  She has a significant left sided weakness which has  been chronic and is unlikely to improve.  She has poor endurance and  requires a 4-point Hiller for assistance in ambulation.  She has received  extensive rehab services but remains significantly disabled.  At the present  time, her hypertension and diabetes remain under reasonable control.  She  has had some post stroke depression and cognitive dysfunction which requires  active treatment.   If further details are required, please do not hesitate to contact this  office.  Over the past year she has been evaluated on six occasions  for followup of her multiple medical problems.  It would also be quite  helpful to receive input from her neurological specialist.    Sincerely,      Gordy Savers, MD   PFK/MedQ  DD:  11/14/2005  DT:  11/14/2005  Job #:  [id number]

## 2010-07-26 NOTE — Assessment & Plan Note (Signed)
INTERVAL HISTORY:  The patient returns today after I last saw her upon  discharge from the inpatient rehabilitation unit at [facility] on 12/11/03.  She had a right intracranial hemorrhage with left hemiparesis.  Risk factors  included hypertension and insulin-dependent diabetes.  Her CT showed a large  4 x 5 cm acute right lentiform nucleus hemorrhage that was treated  medically.  She initially needed a nasogastric tube, but this was  discontinued on 11/16/03.  She had a UTI with Klebsiella in September as well.  She had highly elevated blood sugars in the high 200s.  She went through the  rehab program from 11/16/03 to 12/11/03.  She had some improvement in left  upper and lower extremity strength.  She had improved blood with Lantus and  Novolog.  She was started on Ritalin to help with attention and focus.  She  is complaining of nervousness now, however.   She has finished with home health OT/PT.  She just has a home exercise  program.  She does have some left-sided weakness, particularly in the upper  extremity, and walks with a cane.  Her mother assists her with showers  because of balance issues.   CURRENT MEDICATIONS:  1.  Labetalol 300 b.i.d.  2. Altace 10 p.o. daily.  3. Zocor 20 mg p.o.      daily.  4. Protonix 40 p.o. daily.  5. Ritalin 2.5 q.a.m. and q. noon.      We will discontinue this now.  6. Novolog 5 units t.i.d.  7.      Hydrochlorothiazide 25 p.o. daily.  8. Potassium 20 mEq p.o. daily.  9.      Altace 10 mg in the morning and 5 mg in the evening.  10. Lantus 85      units at bedtime.  11. Neurontin 200 units t.i.d.   DISCHARGE DIET:  Diabetic.   SOCIAL HISTORY:  She has not driven.  She was fired from her job because of  her stroke.  She lives with her mother.  She is single.  Last worked  11/08/03.   REVIEW OF SYSTEMS:  She has some mild right shoulder pain, some pain that  runs under the right breast as well into the right scapular area.  Positive  for weakness  and numbness on left side.  Bladder incontinence.  She has had  a rash, which she thinks may be due to the soap that she has been using.   PHYSICAL EXAMINATION:  Blood pressure 186/89, pulse 86, respirations 16, O2  saturation 100% on room air.  Gait is using a cane.  She has no obvious toe  drag or knee instability.  She does carry the cane at times.  Left upper  extremity strength 4/5.  Left lower extremity is 4/5 in all muscle groups.  Right side is 5/5.  She has mildly decreased finger-to-thumb opposition as  well as finger-nose-finger dysmetria in the left upper extremity.  Right  upper extremity has no pain with range of motion or to palpation.  Skin  shows a macular rash and hyperpigmentation.  No erythema.  Appears to be  clearing left side of chest compared to right.   IMPRESSION:  1.  Right ICH with spastic left hemiparesis.  2. Diabetes and hypertension      as risk factors.   PLAN:  1.  Primary care follow up with Dr. Amador Cunas.  2. Referral to      occupational therapy through [facility].  I think that      she can benefit from additional training with a work focus.  She may      need      vocational rehab, as she is losing her insurance and may not  be able to      afford her COBRA.  I will see her back in 2 months.  I think she can      drive now.  She has no field cut.  3. Discontinue Ritalin as above.       AEK/MedQ  D:  01/04/2004 16:25:06  T:  01/05/2004 12:54:31  Job #:  [id number]   cc:   Gordy Savers, M.D. [facility]   Pramod P. Pearlean Brownie, MD  Fax: [phone]   Marolyn Hammock. Thad Ranger, M.D.  [address]  [address]  [place]  [place] [address]  Fax: [phone]

## 2010-09-27 ENCOUNTER — Encounter: Payer: Self-pay | Admitting: Internal Medicine

## 2010-09-27 ENCOUNTER — Ambulatory Visit (INDEPENDENT_AMBULATORY_CARE_PROVIDER_SITE_OTHER): Payer: Medicare Other | Admitting: Internal Medicine

## 2010-09-27 ENCOUNTER — Other Ambulatory Visit: Payer: Self-pay | Admitting: Internal Medicine

## 2010-09-27 DIAGNOSIS — E785 Hyperlipidemia, unspecified: Secondary | ICD-10-CM

## 2010-09-27 DIAGNOSIS — I10 Essential (primary) hypertension: Secondary | ICD-10-CM

## 2010-09-27 DIAGNOSIS — E119 Type 2 diabetes mellitus without complications: Secondary | ICD-10-CM

## 2010-09-27 DIAGNOSIS — I679 Cerebrovascular disease, unspecified: Secondary | ICD-10-CM

## 2010-09-27 LAB — HEMOGLOBIN A1C: Hgb A1c MFr Bld: 9 % — ABNORMAL HIGH (ref 4.6–6.5)

## 2010-09-27 MED ORDER — INSULIN PEN NEEDLE 31G X 5 MM MISC: 1.0000 | Freq: Three times a day (TID) | Status: DC

## 2010-09-27 MED ORDER — INSULIN ASPART 100 UNIT/ML ~~LOC~~ SOLN: SUBCUTANEOUS | Status: DC

## 2010-09-27 NOTE — Progress Notes (Signed)
  Subjective:    Patient ID: [name], female    DOB: 11-12-1960, 50 y.o.   MRN: [id number]  HPI  50 year old patient who is seen today for followup of her type 2 diabetes. Her fasting blood sugars remain nicely controlled. However her hemoglobin A1c last visit was greater than 8. Her mealtime insulin was up titrated but she still takes only 24 units before each meal she does not track mealtime blood sugars regularly. She is on a sliding scale to take additional mealtime insulin if over 200. She feels well today and denies any focal neurological symptoms. Denies any cardiopulmonary complaints. She has treated hypertension and dyslipidemia. Her weight is 247  Wt Readings from Last 3 Encounters:  09/27/10 247 lb (112.038 kg)  07/02/10 243 lb (110.224 kg)  03/29/10 248 lb (112.492 kg)    Lab Results  Component Value Date   HGBA1C 8.7* 07/02/2010      Review of Systems  Constitutional: Negative.   HENT: Negative for hearing loss, congestion, sore throat, rhinorrhea, dental problem, sinus pressure and tinnitus.   Eyes: Negative for pain, discharge and visual disturbance.  Respiratory: Negative for cough and shortness of breath.   Cardiovascular: Negative for chest pain, palpitations and leg swelling.  Gastrointestinal: Negative for nausea, vomiting, abdominal pain, diarrhea, constipation, blood in stool and abdominal distention.  Genitourinary: Negative for dysuria, urgency, frequency, hematuria, flank pain, vaginal bleeding, vaginal discharge, difficulty urinating, vaginal pain and pelvic pain.  Musculoskeletal: Negative for joint swelling, arthralgias and gait problem.  Skin: Negative for rash.  Neurological: Negative for dizziness, syncope, speech difficulty, weakness, numbness and headaches.  Hematological: Negative for adenopathy.  Psychiatric/Behavioral: Negative for behavioral problems, dysphoric mood and agitation. The patient is not nervous/anxious.        Objective:   Physical Exam  Constitutional: She is oriented to person, place, and time. She appears well-developed and well-nourished.       Obese. Weight 247. Blood pressure 120/84  HENT:  Head: Normocephalic.  Right Ear: External ear normal.  Left Ear: External ear normal.  Mouth/Throat: Oropharynx is clear and moist.  Eyes: Conjunctivae and EOM are normal. Pupils are equal, round, and reactive to light.  Neck: Normal range of motion. Neck supple. No thyromegaly present.  Cardiovascular: Normal rate, regular rhythm, normal heart sounds and intact distal pulses.   Pulmonary/Chest: Effort normal and breath sounds normal.  Abdominal: Soft. Bowel sounds are normal. She exhibits no mass. There is no tenderness.  Musculoskeletal: Normal range of motion.  Lymphadenopathy:    She has no cervical adenopathy.  Neurological: She is alert and oriented to person, place, and time.  Skin: Skin is warm and dry. No rash noted.  Psychiatric: She has a normal mood and affect. Her behavior is normal.        Assessment & Plan:     Diabetes mellitus. We'll uptitrate her mealtime insulin. Compliance stressed weight loss encouraged Hypertension well controlled Dyslipidemia  Eye exam next month as scheduled Return here in 3 months

## 2010-09-27 NOTE — Patient Instructions (Signed)
Please check your hemoglobin A1c every 3 months  You need to lose weight.  Consider a lower calorie diet and regular     It is important that you exercise regularly, at least 20 minutes 3 to 4 times per week.  If you develop chest pain or shortness of breath seek  medical attention.

## 2010-10-03 ENCOUNTER — Telehealth: Payer: Self-pay | Admitting: Internal Medicine

## 2010-10-03 DIAGNOSIS — E119 Type 2 diabetes mellitus without complications: Secondary | ICD-10-CM

## 2010-10-03 NOTE — Telephone Encounter (Signed)
On insulin pens. Pharmacy gave her vials. She wants the pens instead. [facility]

## 2010-10-04 MED ORDER — INSULIN ASPART 100 UNIT/ML ~~LOC~~ SOLN: SUBCUTANEOUS | Status: DC

## 2010-10-04 NOTE — Telephone Encounter (Signed)
Spoke with pt and novolog pens has been called in to [facility].  Pt is aware.

## 2010-10-15 ENCOUNTER — Ambulatory Visit (INDEPENDENT_AMBULATORY_CARE_PROVIDER_SITE_OTHER): Payer: Medicare Other | Admitting: Ophthalmology

## 2010-10-15 DIAGNOSIS — E11319 Type 2 diabetes mellitus with unspecified diabetic retinopathy without macular edema: Secondary | ICD-10-CM

## 2010-10-15 DIAGNOSIS — H43819 Vitreous degeneration, unspecified eye: Secondary | ICD-10-CM

## 2010-10-15 DIAGNOSIS — E11359 Type 2 diabetes mellitus with proliferative diabetic retinopathy without macular edema: Secondary | ICD-10-CM

## 2010-12-27 ENCOUNTER — Ambulatory Visit (INDEPENDENT_AMBULATORY_CARE_PROVIDER_SITE_OTHER): Payer: Medicare Other | Admitting: Internal Medicine

## 2010-12-27 ENCOUNTER — Encounter: Payer: Self-pay | Admitting: Internal Medicine

## 2010-12-27 DIAGNOSIS — E785 Hyperlipidemia, unspecified: Secondary | ICD-10-CM

## 2010-12-27 DIAGNOSIS — I679 Cerebrovascular disease, unspecified: Secondary | ICD-10-CM

## 2010-12-27 DIAGNOSIS — E119 Type 2 diabetes mellitus without complications: Secondary | ICD-10-CM

## 2010-12-27 DIAGNOSIS — I10 Essential (primary) hypertension: Secondary | ICD-10-CM

## 2010-12-27 NOTE — Progress Notes (Signed)
  Subjective:    Patient ID: Lynn Hobbs, female    DOB: 21-Dec-1960, 50 y.o.   MRN: [id number]  HPI  Wt Readings from Last 3 Encounters:  12/27/10 240 lb (108.863 kg)  09/27/10 247 lb (112.038 kg)  07/02/10 243 lb (110.224 kg)    A 50 year old patient who is in today for followup. 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 She continues to have generalized weakness more marked on the left. She has type 2 diabetes. Her last hemoglobin A1c was 9 and her insulin adjusted. At the present time she is taking Lantus insulin 80 units at bedtime with nice fasting blood sugars she is taking 30 units at mealtime insulin. She feels well. Since her last visit here she has lost 7 pounds in weight. She requires disability form completion denies any focal neurological symptoms. Denies any chest pain or shortness of breath Review of Systems  Constitutional: Positive for fatigue.  HENT: Negative for hearing loss, congestion, sore throat, rhinorrhea, dental problem, sinus pressure and tinnitus.   Eyes: Negative for pain, discharge and visual disturbance.  Respiratory: Negative for cough and shortness of breath.   Cardiovascular: Negative for chest pain, palpitations and leg swelling.  Gastrointestinal: Negative for nausea, vomiting, abdominal pain, diarrhea, constipation, blood in stool and abdominal distention.  Genitourinary: Negative for dysuria, urgency, frequency, hematuria, flank pain, vaginal bleeding, vaginal discharge, difficulty urinating, vaginal pain and pelvic pain.  Musculoskeletal: Positive for gait problem. Negative for joint swelling and arthralgias.  Skin: Negative for rash.  Neurological: Positive for weakness. Negative for dizziness, syncope, speech difficulty, numbness and headaches.  Hematological: Negative for adenopathy.  Psychiatric/Behavioral: Negative for behavioral problems, dysphoric mood and agitation. The patient is not nervous/anxious.         Objective:   Physical Exam  Constitutional: She is oriented to person, place, and time. She appears well-developed and well-nourished.       Blood pressure 126/80  HENT:  Head: Normocephalic.  Right Ear: External ear normal.  Left Ear: External ear normal.  Mouth/Throat: Oropharynx is clear and moist.  Eyes: Conjunctivae and EOM are normal. Pupils are equal, round, and reactive to light.  Neck: Normal range of motion. Neck supple. No thyromegaly present.  Cardiovascular: Normal rate, regular rhythm, normal heart sounds and intact distal pulses.   Pulmonary/Chest: Effort normal and breath sounds normal.  Abdominal: Soft. Bowel sounds are normal. She exhibits no mass. There is no tenderness.  Musculoskeletal: Normal range of motion.  Lymphadenopathy:    She has no cervical adenopathy.  Neurological: She is alert and oriented to person, place, and time. Coordination abnormal.       Left greater than right sided weakness  Skin: Skin is warm and dry. No rash noted.  Psychiatric: She has a normal mood and affect. Her behavior is normal.          Assessment & Plan:   Cerebrovascular disease. Hypertension well controlled Diabetes mellitus. We'll check a hemoglobin A1c. There has been some significant weight loss also for better glycemic control Dyslipidemia  We'll complete disability forms

## 2010-12-27 NOTE — Patient Instructions (Signed)
Limit your sodium (Salt) intake   Please check your hemoglobin A1c every 3 months   

## 2011-02-17 ENCOUNTER — Ambulatory Visit (INDEPENDENT_AMBULATORY_CARE_PROVIDER_SITE_OTHER): Payer: Medicare Other | Admitting: Ophthalmology

## 2011-02-17 DIAGNOSIS — H43819 Vitreous degeneration, unspecified eye: Secondary | ICD-10-CM

## 2011-02-17 DIAGNOSIS — E1139 Type 2 diabetes mellitus with other diabetic ophthalmic complication: Secondary | ICD-10-CM

## 2011-02-17 DIAGNOSIS — E11359 Type 2 diabetes mellitus with proliferative diabetic retinopathy without macular edema: Secondary | ICD-10-CM

## 2011-02-17 DIAGNOSIS — E11319 Type 2 diabetes mellitus with unspecified diabetic retinopathy without macular edema: Secondary | ICD-10-CM

## 2011-03-28 ENCOUNTER — Ambulatory Visit: Payer: Medicare Other | Admitting: Internal Medicine

## 2011-04-01 ENCOUNTER — Ambulatory Visit (INDEPENDENT_AMBULATORY_CARE_PROVIDER_SITE_OTHER): Payer: Medicare Other | Admitting: Internal Medicine

## 2011-04-01 ENCOUNTER — Encounter: Payer: Self-pay | Admitting: Internal Medicine

## 2011-04-01 DIAGNOSIS — E785 Hyperlipidemia, unspecified: Secondary | ICD-10-CM | POA: Diagnosis not present

## 2011-04-01 DIAGNOSIS — E119 Type 2 diabetes mellitus without complications: Secondary | ICD-10-CM | POA: Diagnosis not present

## 2011-04-01 DIAGNOSIS — I10 Essential (primary) hypertension: Secondary | ICD-10-CM | POA: Diagnosis not present

## 2011-04-01 DIAGNOSIS — G609 Hereditary and idiopathic neuropathy, unspecified: Secondary | ICD-10-CM | POA: Diagnosis not present

## 2011-04-01 DIAGNOSIS — I679 Cerebrovascular disease, unspecified: Secondary | ICD-10-CM

## 2011-04-01 LAB — HEMOGLOBIN A1C: Hgb A1c MFr Bld: 6.7 % — ABNORMAL HIGH (ref 4.6–6.5)

## 2011-04-01 NOTE — Progress Notes (Signed)
  Subjective:    Patient ID: Lynn Hobbs, female    DOB: 1960/04/03, 51 y.o.   MRN: [id number]  HPI  51 year old patient who is seen today for followup. She has a history of type 2 diabetes. Fasting blood sugars have been well controlled. She is on Lantus 80 units at bedtime. She takes 30 units prior to each meal with a sliding scale of 10 units if blood sugar is in excess of 200.  She states however it does she is only taking an additional 8 units but very infrequently perhaps only once per month. Her last hemoglobin A1c was improved but still over 7 She has renovascular disease which has been stable. She denies any focal neurological symptoms. History of hypertension which has been well controlled blood pressure today is stable. She has dyslipidemia which has been well-controlled on pravastatin.    Review of Systems  HENT: Negative for hearing loss, congestion, sore throat, rhinorrhea, dental problem, sinus pressure and tinnitus.   Eyes: Negative for pain, discharge and visual disturbance.  Respiratory: Negative for cough and shortness of breath.   Cardiovascular: Negative for chest pain, palpitations and leg swelling.  Gastrointestinal: Negative for nausea, vomiting, abdominal pain, diarrhea, constipation, blood in stool and abdominal distention.  Genitourinary: Negative for dysuria, urgency, frequency, hematuria, flank pain, vaginal bleeding, vaginal discharge, difficulty urinating, vaginal pain and pelvic pain.  Musculoskeletal: Negative for joint swelling, arthralgias and gait problem.  Skin: Negative for rash.  Neurological: Positive for weakness. Negative for dizziness, syncope, speech difficulty, numbness and headaches.  Hematological: Negative for adenopathy.  Psychiatric/Behavioral: Positive for decreased concentration. Negative for behavioral problems, dysphoric mood and agitation. The patient is not nervous/anxious.        Objective:   Physical Exam  Constitutional: She is  oriented to person, place, and time. She appears well-developed and well-nourished.  HENT:  Head: Normocephalic.  Right Ear: External ear normal.  Left Ear: External ear normal.  Mouth/Throat: Oropharynx is clear and moist.  Eyes: Conjunctivae and EOM are normal. Pupils are equal, round, and reactive to light.  Neck: Normal range of motion. Neck supple. No thyromegaly present.  Cardiovascular: Normal rate, regular rhythm, normal heart sounds and intact distal pulses.   Pulmonary/Chest: Effort normal and breath sounds normal.  Abdominal: Soft. Bowel sounds are normal. She exhibits no mass. There is no tenderness.  Musculoskeletal: Normal range of motion.  Lymphadenopathy:    She has no cervical adenopathy.  Neurological: She is alert and oriented to person, place, and time.  Skin: Skin is warm and dry. No rash noted.  Psychiatric: She has a normal mood and affect. Her behavior is normal.          Assessment & Plan:   Diabetes mellitus. We'll check a hemoglobin A1c if still greater than 7 will increase meal time insulin. We'll continue sliding scale coverage Hypertension stable Cerebrovascular disease stable Dyslipidemia. Will continue Pravachol 80

## 2011-04-01 NOTE — Progress Notes (Signed)
  Subjective:    Patient ID: Lynn Hobbs, female    DOB: 08/21/1960, 51 y.o.   MRN: [id number]  HPI   Wt Readings from Last 3 Encounters:  04/01/11 236 lb (107.049 kg)  12/27/10 240 lb (108.863 kg)  09/27/10 247 lb (112.038 kg)    Review of Systems     Objective:   Physical Exam        Assessment & Plan:

## 2011-06-30 ENCOUNTER — Encounter: Payer: Self-pay | Admitting: Internal Medicine

## 2011-06-30 ENCOUNTER — Ambulatory Visit (INDEPENDENT_AMBULATORY_CARE_PROVIDER_SITE_OTHER): Payer: Medicare Other | Admitting: Internal Medicine

## 2011-06-30 VITALS — BP 120/80 | Temp 98.7°F | Wt 238.0 lb

## 2011-06-30 DIAGNOSIS — I10 Essential (primary) hypertension: Secondary | ICD-10-CM

## 2011-06-30 DIAGNOSIS — Z794 Long term (current) use of insulin: Secondary | ICD-10-CM | POA: Diagnosis not present

## 2011-06-30 DIAGNOSIS — E119 Type 2 diabetes mellitus without complications: Secondary | ICD-10-CM

## 2011-06-30 DIAGNOSIS — E785 Hyperlipidemia, unspecified: Secondary | ICD-10-CM | POA: Diagnosis not present

## 2011-06-30 DIAGNOSIS — Z79899 Other long term (current) drug therapy: Secondary | ICD-10-CM | POA: Diagnosis not present

## 2011-06-30 DIAGNOSIS — I679 Cerebrovascular disease, unspecified: Secondary | ICD-10-CM | POA: Diagnosis not present

## 2011-06-30 LAB — HM DIABETES FOOT EXAM

## 2011-06-30 LAB — HEMOGLOBIN A1C: Hgb A1c MFr Bld: 7 % — ABNORMAL HIGH (ref 4.6–6.5)

## 2011-06-30 MED ORDER — DIPHENOXYLATE-ATROPINE 2.5-0.025 MG PO TABS: 1.0000 | ORAL_TABLET | Freq: Four times a day (QID) | ORAL | Status: DC | PRN

## 2011-06-30 MED ORDER — PROMETHAZINE HCL 25 MG PO TABS: 25.0000 mg | ORAL_TABLET | Freq: Three times a day (TID) | ORAL | Status: AC | PRN

## 2011-06-30 NOTE — Progress Notes (Signed)
  Subjective:    Patient ID: Lynn Hobbs, female    DOB: 03/10/1960, 51 y.o.   MRN: [id number]  HPI  51 year old patient seen today for followup. She has a she has done pretty well although recovering from a viral gastroenteritis over the weekend. Still remains quite weak but the diarrhea has resolved. Weight is up 2 pounds since her visit 3 months ago. She has treated hypertension and a history of cerebrovascular disease. She has chronic generalized weakness left greater than the right but no new focal symptoms. She has dyslipidemia and remains on pravastatin. She has significant hypertension which is well-controlled    Review of Systems  Constitutional: Positive for fatigue.  HENT: Negative for hearing loss, congestion, sore throat, rhinorrhea, dental problem, sinus pressure and tinnitus.   Eyes: Negative for pain, discharge and visual disturbance.  Respiratory: Negative for cough and shortness of breath.   Cardiovascular: Negative for chest pain, palpitations and leg swelling.  Gastrointestinal: Positive for nausea and diarrhea. Negative for vomiting, abdominal pain, constipation, blood in stool and abdominal distention.  Genitourinary: Negative for dysuria, urgency, frequency, hematuria, flank pain, vaginal bleeding, vaginal discharge, difficulty urinating, vaginal pain and pelvic pain.  Musculoskeletal: Negative for joint swelling, arthralgias and gait problem.  Skin: Negative for rash.  Neurological: Positive for weakness. Negative for dizziness, syncope, speech difficulty, numbness and headaches.  Hematological: Negative for adenopathy.  Psychiatric/Behavioral: Negative for behavioral problems, dysphoric mood and agitation. The patient is not nervous/anxious.        Objective:   Physical Exam  Constitutional: She is oriented to person, place, and time. She appears well-developed and well-nourished.       Overweight. No acute distress. Afebrile. Weight 238. Blood pressure 120/80.    HENT:  Head: Normocephalic.  Right Ear: External ear normal.  Left Ear: External ear normal.  Mouth/Throat: Oropharynx is clear and moist.  Eyes: Conjunctivae and EOM are normal. Pupils are equal, round, and reactive to light.  Neck: Normal range of motion. Neck supple. No thyromegaly present.  Cardiovascular: Normal rate, regular rhythm, normal heart sounds and intact distal pulses.   Pulmonary/Chest: Effort normal and breath sounds normal.  Abdominal: Soft. Bowel sounds are normal. She exhibits no mass. There is no tenderness.  Musculoskeletal: Normal range of motion.  Lymphadenopathy:    She has no cervical adenopathy.  Neurological: She is alert and oriented to person, place, and time.  Skin: Skin is warm and dry. No rash noted.  Psychiatric: She has a normal mood and affect. Her behavior is normal.          Assessment & Plan:   Diabetes mellitus. Appears to be stable presently controlled on 80 units of Lantus at bedtime and 40 units prior to each meal. We'll check a hemoglobin A1c Hypertension well controlled on multiple drugs we'll continue Dyslipidemia stable Cerebrovascular disease stable  Weight loss encouraged. Recheck 3 months medications refilled

## 2011-06-30 NOTE — Patient Instructions (Signed)
Limit your sodium (Salt) intake   Please check your hemoglobin A1c every 3 months  You need to lose weight.  Consider a lower calorie diet and regular exercise. 

## 2011-06-30 NOTE — Progress Notes (Signed)
Subjective:    Patient ID: Lynn Hobbs, female    DOB: 05/29/1960, 51 y.o.   MRN: [id number]  HPI  51 year old patient who is seen today for followup. She has history of type 2 diabetes dyslipidemia and cerebrovascular disease she has treated hypertension. For the past 3 months he has done fairly well. No new concerns or complaints. She seems to have maintained a nice glycemic control denies any hypoglycemia. She also has a history of peripheral neuropathy.  Past Medical History  Diagnosis Date  . BENIGN NEOPLASM OF SKIN SITE UNSPECIFIED 09/08/2008  . CEREBROVASCULAR DISEASE 12/11/2008  . DIABETES MELLITUS, TYPE II 10/12/2006  . HYPERLIPIDEMIA 10/12/2006  . HYPERTENSION 10/12/2006  . PERIPHERAL NEUROPATHY 10/21/2006  . Endometriosis   . Fibroids     History   Social History  . Marital Status: Single    Spouse Name: N/A    Number of Children: N/A  . Years of Education: N/A   Occupational History  . Not on file.   Social History Main Topics  . Smoking status: Never Smoker   . Smokeless tobacco: Never Used  . Alcohol Use: No  . Drug Use: No  . Sexually Active: Not on file   Other Topics Concern  . Not on file   Social History Narrative  . No narrative on file    Past Surgical History  Procedure Date  . Abdominal hysterectomy     No family history on file.  No Known Allergies  Current Outpatient Prescriptions on File Prior to Visit  Medication Sig Dispense Refill  . amLODipine (NORVASC) 10 MG tablet Take 10 mg by mouth daily.        Marland Kitchen aspirin 325 MG tablet Take 325 mg by mouth daily.        . benazepril (LOTENSIN) 40 MG tablet Take 40 mg by mouth daily.        . chlorthalidone (HYGROTON) 25 MG tablet Take 25 mg by mouth daily.        Marland Kitchen escitalopram (LEXAPRO) 20 MG tablet Take 20 mg by mouth daily.        Marland Kitchen gabapentin (NEURONTIN) 300 MG capsule Take 300 mg by mouth 3 (three) times daily.        Marland Kitchen glucose blood (BAYER CONTOUR TEST) test strip 1 each by Other route  daily. Use as instructed       . insulin aspart (NOVOLOG FLEXPEN) 100 UNIT/ML injection Inject 30 units prior to each meal 3 times daily.  If blood sugar is in excess of 200 add an additional 10 units.  3 mL  6  . insulin aspart (NOVOLOG) 100 UNIT/ML injection 30 units prior to each meal 3 times daily; if blood sugar is in excess of 200 and an additional 10 units  10 mL  3  . insulin glargine (LANTUS SOLOSTAR) 100 UNIT/ML injection Inject 80 Units into the skin at bedtime.  48 mL  3  . Insulin Pen Needle 31G X 5 MM MISC 1 each by Does not apply route 4 (four) times daily -  with meals and at bedtime.  100 each  4  . labetalol (NORMODYNE) 200 MG tablet Take 200 mg by mouth 2 (two) times daily.        . potassium chloride (KLOR-CON) 20 MEQ packet Take 20 mEq by mouth daily.        . pravastatin (PRAVACHOL) 80 MG tablet Take 80 mg by mouth daily.        Marland Kitchen  promethazine (PHENERGAN) 25 MG tablet Take 1 tablet (25 mg total) by mouth every 8 (eight) hours as needed for nausea.  20 tablet  0    BP 120/80  Temp(Src) 98.7 F (37.1 C) (Oral)  Wt 238 lb (107.956 kg)     Wt Readings from Last 3 Encounters:  06/30/11 238 lb (107.956 kg)  04/01/11 236 lb (107.049 kg)  12/27/10 240 lb (108.863 kg)    Review of Systems  HENT: Negative for hearing loss, congestion, sore throat, rhinorrhea, dental problem, sinus pressure and tinnitus.   Eyes: Negative for pain, discharge and visual disturbance.  Respiratory: Negative for cough and shortness of breath.   Cardiovascular: Negative for chest pain, palpitations and leg swelling.  Gastrointestinal: Negative for nausea, vomiting, abdominal pain, diarrhea, constipation, blood in stool and abdominal distention.  Genitourinary: Negative for dysuria, urgency, frequency, hematuria, flank pain, vaginal bleeding, vaginal discharge, difficulty urinating, vaginal pain and pelvic pain.  Musculoskeletal: Positive for gait problem. Negative for joint swelling and  arthralgias.  Skin: Negative for rash.  Neurological: Positive for weakness. Negative for dizziness, syncope, speech difficulty, numbness and headaches.  Hematological: Negative for adenopathy.  Psychiatric/Behavioral: Positive for decreased concentration. Negative for behavioral problems, dysphoric mood and agitation. The patient is not nervous/anxious.        Objective:   Physical Exam  Constitutional: She is oriented to person, place, and time. She appears well-developed and well-nourished.  HENT:  Head: Normocephalic.  Right Ear: External ear normal.  Left Ear: External ear normal.  Mouth/Throat: Oropharynx is clear and moist.  Eyes: Conjunctivae and EOM are normal. Pupils are equal, round, and reactive to light.  Neck: Normal range of motion. Neck supple. No thyromegaly present.  Cardiovascular: Normal rate, regular rhythm, normal heart sounds and intact distal pulses.   Pulmonary/Chest: Effort normal and breath sounds normal.  Abdominal: Soft. Bowel sounds are normal. She exhibits no mass. There is no tenderness.  Musculoskeletal: Normal range of motion.  Lymphadenopathy:    She has no cervical adenopathy.  Neurological: She is alert and oriented to person, place, and time.  Skin: Skin is warm and dry. No rash noted.  Psychiatric: She has a normal mood and affect. Her behavior is normal.          Assessment & Plan:   Diabetes mellitus. We'll check a hemoglobin A1c Hypertension well controlled Dyslipidemia stable Cerebrovascular disease. Will continue aggressive risk factor modification

## 2011-07-09 ENCOUNTER — Telehealth: Payer: Self-pay | Admitting: *Deleted

## 2011-07-09 MED ORDER — PROMETHAZINE HCL 25 MG PO TABS: 25.0000 mg | ORAL_TABLET | Freq: Four times a day (QID) | ORAL | Status: DC | PRN

## 2011-07-09 NOTE — Telephone Encounter (Signed)
Generic phenergan 25  #30 one every 6 hrs prn nausea

## 2011-07-09 NOTE — Telephone Encounter (Signed)
Please advise 

## 2011-07-09 NOTE — Telephone Encounter (Signed)
Done and sent to [facility]

## 2011-07-09 NOTE — Telephone Encounter (Signed)
(  triage voicemail) Pt is having nausea and is requesting to have something called in.  She was prescribed something last week for diarrhea but now needs something for the nausea.

## 2011-07-24 ENCOUNTER — Ambulatory Visit (INDEPENDENT_AMBULATORY_CARE_PROVIDER_SITE_OTHER): Payer: Medicare Other | Admitting: Internal Medicine

## 2011-07-24 ENCOUNTER — Encounter: Payer: Self-pay | Admitting: Internal Medicine

## 2011-07-24 VITALS — BP 180/100 | Temp 98.5°F | Wt 235.0 lb

## 2011-07-24 DIAGNOSIS — I10 Essential (primary) hypertension: Secondary | ICD-10-CM

## 2011-07-24 DIAGNOSIS — R110 Nausea: Secondary | ICD-10-CM

## 2011-07-24 DIAGNOSIS — E119 Type 2 diabetes mellitus without complications: Secondary | ICD-10-CM | POA: Diagnosis not present

## 2011-07-24 MED ORDER — PROMETHAZINE HCL 25 MG PO TABS: 25.0000 mg | ORAL_TABLET | Freq: Four times a day (QID) | ORAL | Status: AC | PRN

## 2011-07-24 NOTE — Progress Notes (Signed)
  Subjective:    Patient ID: Lynn Hobbs, female    DOB: 06-04-1960, 51 y.o.   MRN: [id number]  HPI  51 year old patient who was seen here 3 weeks ago for followup of her diabetes and hypertension. Since her last visit she has had some nausea. She feels this is related to the taking labetalol twice daily for her blood pressure she does take this medication with meals. When she omits the medication she states that she is much improved. Her diabetes has done well. No change in bowel habits or vomiting  Review of Systems  Constitutional: Negative.   HENT: Negative for hearing loss, congestion, sore throat, rhinorrhea, dental problem, sinus pressure and tinnitus.   Eyes: Negative for pain, discharge and visual disturbance.  Respiratory: Negative for cough and shortness of breath.   Cardiovascular: Negative for chest pain, palpitations and leg swelling.  Gastrointestinal: Positive for nausea. Negative for vomiting, abdominal pain, diarrhea, constipation, blood in stool and abdominal distention.  Genitourinary: Negative for dysuria, urgency, frequency, hematuria, flank pain, vaginal bleeding, vaginal discharge, difficulty urinating, vaginal pain and pelvic pain.  Musculoskeletal: Negative for joint swelling, arthralgias and gait problem.  Skin: Negative for rash.  Neurological: Negative for dizziness, syncope, speech difficulty, weakness, numbness and headaches.  Hematological: Negative for adenopathy.  Psychiatric/Behavioral: Negative for behavioral problems, dysphoric mood and agitation. The patient is not nervous/anxious.        Objective:   Physical Exam  Constitutional: She is oriented to person, place, and time. She appears well-developed and well-nourished.       Blood pressure 160/90  HENT:  Head: Normocephalic.  Right Ear: External ear normal.  Left Ear: External ear normal.  Mouth/Throat: Oropharynx is clear and moist.  Eyes: Conjunctivae and EOM are normal. Pupils are equal,  round, and reactive to light.  Neck: Normal range of motion. Neck supple. No thyromegaly present.  Cardiovascular: Normal rate, regular rhythm, normal heart sounds and intact distal pulses.   Pulmonary/Chest: Effort normal and breath sounds normal.  Abdominal: Soft. Bowel sounds are normal. She exhibits no distension and no mass. There is no tenderness. There is no rebound and no guarding.  Musculoskeletal: Normal range of motion.  Lymphadenopathy:    She has no cervical adenopathy.  Neurological: She is alert and oriented to person, place, and time.  Skin: Skin is warm and dry. No rash noted.  Psychiatric: She has a normal mood and affect. Her behavior is normal.          Assessment & Plan:   Nausea. We'll pretreat with Phenergan prior to her meals and observe over the next week or 2 hopefully symptoms will be self-limiting We'll treat with short-term Dexilant Diabetes stable Hypertension stable

## 2011-07-24 NOTE — Patient Instructions (Signed)
Avoids foods high in acid such as tomatoes citrus juices, and spicy foods.  Avoid eating within two hours of lying down or before exercising.  Do not overheat.  Try smaller more frequent meals.  If symptoms persist, elevate the head of her bed 12 inches while sleeping.  DEXILANT  one daily

## 2011-07-24 NOTE — Progress Notes (Signed)
  Subjective:    Patient ID: Lynn Hobbs, female    DOB: 01/25/1961, 51 y.o.   MRN: [id number]  HPI  BP Readings from Last 3 Encounters:  07/24/11 180/100  06/30/11 120/80  04/01/11 120/82    Review of Systems     Objective:   Physical Exam        Assessment & Plan:

## 2011-07-28 ENCOUNTER — Encounter: Payer: Self-pay | Admitting: Internal Medicine

## 2011-07-28 ENCOUNTER — Ambulatory Visit (INDEPENDENT_AMBULATORY_CARE_PROVIDER_SITE_OTHER): Payer: Medicare Other | Admitting: Internal Medicine

## 2011-07-28 VITALS — BP 140/90 | Temp 98.9°F | Wt 227.0 lb

## 2011-07-28 DIAGNOSIS — I10 Essential (primary) hypertension: Secondary | ICD-10-CM | POA: Diagnosis not present

## 2011-07-28 DIAGNOSIS — H8190 Unspecified disorder of vestibular function, unspecified ear: Secondary | ICD-10-CM

## 2011-07-28 DIAGNOSIS — I679 Cerebrovascular disease, unspecified: Secondary | ICD-10-CM | POA: Diagnosis not present

## 2011-07-28 MED ORDER — MECLIZINE HCL 25 MG PO TABS: 25.0000 mg | ORAL_TABLET | Freq: Three times a day (TID) | ORAL | Status: AC | PRN

## 2011-07-28 NOTE — Progress Notes (Signed)
  Subjective:    Patient ID: Lynn Hobbs, female    DOB: 11-20-1960, 51 y.o.   MRN: [id number]  HPI 51 year old patient has a history of cerebral vascular disease and treated hypertension. She also has type 2 diabetes. The past 10 days or so she has had some positional vertigo. She describes a spinning sensation and a sense of being off balance. No prior history of vertigo associated symptoms include nausea    Review of Systems  Constitutional: Negative.   HENT: Negative for hearing loss, congestion, sore throat, rhinorrhea, dental problem, sinus pressure and tinnitus.   Eyes: Negative for pain, discharge and visual disturbance.  Respiratory: Negative for cough and shortness of breath.   Cardiovascular: Negative for chest pain, palpitations and leg swelling.  Gastrointestinal: Positive for nausea. Negative for vomiting, abdominal pain, diarrhea, constipation, blood in stool and abdominal distention.  Genitourinary: Negative for dysuria, urgency, frequency, hematuria, flank pain, vaginal bleeding, vaginal discharge, difficulty urinating, vaginal pain and pelvic pain.  Musculoskeletal: Positive for gait problem. Negative for joint swelling and arthralgias.  Skin: Negative for rash.  Neurological: Positive for light-headedness. Negative for dizziness, syncope, speech difficulty, weakness, numbness and headaches.  Hematological: Negative for adenopathy.  Psychiatric/Behavioral: Negative for behavioral problems, dysphoric mood and agitation. The patient is not nervous/anxious.        Objective:   Physical Exam  Constitutional: She is oriented to person, place, and time. She appears well-developed and well-nourished.  HENT:  Head: Normocephalic.  Right Ear: External ear normal.  Left Ear: External ear normal.  Mouth/Throat: Oropharynx is clear and moist.  Eyes: Conjunctivae and EOM are normal. Pupils are equal, round, and reactive to light.  Neck: Normal range of motion. Neck supple. No  thyromegaly present.  Cardiovascular: Normal rate, regular rhythm, normal heart sounds and intact distal pulses.   Pulmonary/Chest: Effort normal and breath sounds normal.  Abdominal: Soft. Bowel sounds are normal. She exhibits no mass. There is no tenderness.  Musculoskeletal: Normal range of motion.  Lymphadenopathy:    She has no cervical adenopathy.  Neurological: She is alert and oriented to person, place, and time. No cranial nerve deficit.  Skin: Skin is warm and dry. No rash noted.  Psychiatric: She has a normal mood and affect. Her behavior is normal.          Assessment & Plan:   Positional vertigo. We'll treat with meclizine and continue Phenergan. She'll minimize her activity did tend to aggravate symptoms Hypertension stable Diabetes stable

## 2011-07-28 NOTE — Patient Instructions (Signed)
Minimize your activities  Limit your sodium (Salt) intake  Call or return to clinic prn if these symptoms worsen or fail to improve as anticipated.

## 2011-08-18 ENCOUNTER — Ambulatory Visit (INDEPENDENT_AMBULATORY_CARE_PROVIDER_SITE_OTHER): Payer: Medicare Other | Admitting: Ophthalmology

## 2011-08-18 DIAGNOSIS — E11319 Type 2 diabetes mellitus with unspecified diabetic retinopathy without macular edema: Secondary | ICD-10-CM

## 2011-08-18 DIAGNOSIS — E11359 Type 2 diabetes mellitus with proliferative diabetic retinopathy without macular edema: Secondary | ICD-10-CM

## 2011-08-18 DIAGNOSIS — E1165 Type 2 diabetes mellitus with hyperglycemia: Secondary | ICD-10-CM

## 2011-08-18 DIAGNOSIS — H2510 Age-related nuclear cataract, unspecified eye: Secondary | ICD-10-CM | POA: Diagnosis not present

## 2011-08-18 DIAGNOSIS — H43819 Vitreous degeneration, unspecified eye: Secondary | ICD-10-CM | POA: Diagnosis not present

## 2011-09-30 ENCOUNTER — Ambulatory Visit: Payer: Medicare Other | Admitting: Internal Medicine

## 2011-09-30 ENCOUNTER — Other Ambulatory Visit: Payer: Self-pay | Admitting: Internal Medicine

## 2011-10-27 ENCOUNTER — Ambulatory Visit (INDEPENDENT_AMBULATORY_CARE_PROVIDER_SITE_OTHER): Payer: Medicare Other | Admitting: Internal Medicine

## 2011-10-27 ENCOUNTER — Encounter: Payer: Self-pay | Admitting: Internal Medicine

## 2011-10-27 VITALS — BP 112/80 | Temp 97.7°F | Wt 232.0 lb

## 2011-10-27 DIAGNOSIS — I10 Essential (primary) hypertension: Secondary | ICD-10-CM | POA: Diagnosis not present

## 2011-10-27 DIAGNOSIS — I679 Cerebrovascular disease, unspecified: Secondary | ICD-10-CM

## 2011-10-27 DIAGNOSIS — E119 Type 2 diabetes mellitus without complications: Secondary | ICD-10-CM | POA: Diagnosis not present

## 2011-10-27 NOTE — Progress Notes (Signed)
  Subjective:    Patient ID: Lynn Hobbs, female    DOB: Sep 24, 1960, 51 y.o.   MRN: [id number]  HPI  Wt Readings from Last 3 Encounters:  10/27/11 232 lb (105.235 kg)  07/28/11 227 lb (102.967 kg)  07/24/11 235 lb (106.595 kg)    Review of Systems     Objective:   Physical Exam        Assessment & Plan:

## 2011-10-27 NOTE — Progress Notes (Signed)
Subjective:    Patient ID: Lynn Hobbs, female    DOB: 07/07/1960, 51 y.o.   MRN: [id number]  HPI  51 year old patient seen today for followup of hypertension and diabetes. She is scheduled for followup eye exam in October. Doing quite well. Unfortunately weight up 5 pounds. No hypoglycemia. Last hemoglobin A1c was nicely controlled. Fasting blood sugar this morning 133 Her only complaint is a dry cough during the night only. Medical regimen does include ACE inhibition.  Past Medical History  Diagnosis Date  . BENIGN NEOPLASM OF SKIN SITE UNSPECIFIED 09/08/2008  . CEREBROVASCULAR DISEASE 12/11/2008  . DIABETES MELLITUS, TYPE II 10/12/2006  . HYPERLIPIDEMIA 10/12/2006  . HYPERTENSION 10/12/2006  . PERIPHERAL NEUROPATHY 10/21/2006  . Endometriosis   . Fibroids     History   Social History  . Marital Status: Single    Spouse Name: N/A    Number of Children: N/A  . Years of Education: N/A   Occupational History  . Not on file.   Social History Main Topics  . Smoking status: Never Smoker   . Smokeless tobacco: Never Used  . Alcohol Use: No  . Drug Use: No  . Sexually Active: Not on file   Other Topics Concern  . Not on file   Social History Narrative  . No narrative on file    Past Surgical History  Procedure Date  . Abdominal hysterectomy     No family history on file.  No Known Allergies  Current Outpatient Prescriptions on File Prior to Visit  Medication Sig Dispense Refill  . amLODipine (NORVASC) 10 MG tablet Take 10 mg by mouth daily.        Marland Kitchen aspirin 325 MG tablet Take 325 mg by mouth daily.        . B-D UF III MINI PEN NEEDLES 31G X 5 MM MISC USE ONE FOUR TIMES DAILY WITH MEALS AND AT BEDTIEM  100 each  3  . benazepril (LOTENSIN) 40 MG tablet Take 40 mg by mouth daily.        . chlorthalidone (HYGROTON) 25 MG tablet Take 25 mg by mouth daily.        . diphenoxylate-atropine (LOMOTIL) 2.5-0.025 MG per tablet Take 1 tablet by mouth 4 (four) times daily as  needed.  30 tablet  2  . escitalopram (LEXAPRO) 20 MG tablet Take 20 mg by mouth daily.        Marland Kitchen gabapentin (NEURONTIN) 300 MG capsule Take 300 mg by mouth 3 (three) times daily.        Marland Kitchen glucose blood (BAYER CONTOUR TEST) test strip 1 each by Other route daily. Use as instructed       . insulin aspart (NOVOLOG FLEXPEN) 100 UNIT/ML injection Inject 30 units prior to each meal 3 times daily.  If blood sugar is in excess of 200 add an additional 10 units.  3 mL  6  . insulin glargine (LANTUS SOLOSTAR) 100 UNIT/ML injection Inject 80 Units into the skin at bedtime.  48 mL  3  . labetalol (NORMODYNE) 200 MG tablet Take 200 mg by mouth 2 (two) times daily.        . meclizine (ANTIVERT) 25 MG tablet Take 1 tablet (25 mg total) by mouth 3 (three) times daily as needed for dizziness or nausea.  30 tablet  1  . potassium chloride (KLOR-CON) 20 MEQ packet Take 20 mEq by mouth daily.        . pravastatin (PRAVACHOL) 80 MG tablet  Take 80 mg by mouth daily.          BP 112/80  Temp 97.7 F (36.5 C) (Oral)  Wt 232 lb (105.235 kg)      Review of Systems  Constitutional: Negative.   HENT: Negative for hearing loss, congestion, sore throat, rhinorrhea, dental problem, sinus pressure and tinnitus.   Eyes: Negative for pain, discharge and visual disturbance.  Respiratory: Positive for cough. Negative for shortness of breath.   Cardiovascular: Negative for chest pain, palpitations and leg swelling.  Gastrointestinal: Negative for nausea, vomiting, abdominal pain, diarrhea, constipation, blood in stool and abdominal distention.  Genitourinary: Negative for dysuria, urgency, frequency, hematuria, flank pain, vaginal bleeding, vaginal discharge, difficulty urinating, vaginal pain and pelvic pain.  Musculoskeletal: Negative for joint swelling, arthralgias and gait problem.  Skin: Negative for rash.  Neurological: Negative for dizziness, syncope, speech difficulty, weakness, numbness and headaches.    Hematological: Negative for adenopathy.  Psychiatric/Behavioral: Negative for behavioral problems, dysphoric mood and agitation. The patient is not nervous/anxious.        Objective:   Physical Exam  Constitutional: She is oriented to person, place, and time. She appears well-developed and well-nourished.  HENT:  Head: Normocephalic.  Right Ear: External ear normal.  Left Ear: External ear normal.  Mouth/Throat: Oropharynx is clear and moist.  Eyes: Conjunctivae and EOM are normal. Pupils are equal, round, and reactive to light.  Neck: Normal range of motion. Neck supple. No thyromegaly present.  Cardiovascular: Normal rate, regular rhythm, normal heart sounds and intact distal pulses.   Pulmonary/Chest: Effort normal and breath sounds normal.  Abdominal: Soft. Bowel sounds are normal. She exhibits no mass. There is no tenderness.  Musculoskeletal: Normal range of motion.  Lymphadenopathy:    She has no cervical adenopathy.  Neurological: She is alert and oriented to person, place, and time.  Skin: Skin is warm and dry. No rash noted.  Psychiatric: She has a normal mood and affect. Her behavior is normal.          Assessment & Plan:   Hypertension. Well controlled. We'll continue present regimen. Will be to substitute Benicar for Lotensin until cough has resolved. Samples provided Diabetes mellitus. Will check a hemoglobin A1c  Recheck 3 monthsand and and in is in place date August 5 and and and 8 and is believed that and

## 2011-10-27 NOTE — Patient Instructions (Signed)
Limit your sodium (Salt) intake   Please check your hemoglobin A1c every 3 months   

## 2011-10-28 NOTE — Progress Notes (Signed)
Quick Note:  Spoke with pt - informed of lab and dr. kwiatkowski's instructions ______ 

## 2011-12-18 ENCOUNTER — Ambulatory Visit (INDEPENDENT_AMBULATORY_CARE_PROVIDER_SITE_OTHER): Payer: Medicare Other | Admitting: Ophthalmology

## 2011-12-18 DIAGNOSIS — H35039 Hypertensive retinopathy, unspecified eye: Secondary | ICD-10-CM

## 2011-12-18 DIAGNOSIS — H43819 Vitreous degeneration, unspecified eye: Secondary | ICD-10-CM

## 2011-12-18 DIAGNOSIS — E11359 Type 2 diabetes mellitus with proliferative diabetic retinopathy without macular edema: Secondary | ICD-10-CM

## 2011-12-18 DIAGNOSIS — E1139 Type 2 diabetes mellitus with other diabetic ophthalmic complication: Secondary | ICD-10-CM

## 2011-12-18 DIAGNOSIS — I10 Essential (primary) hypertension: Secondary | ICD-10-CM

## 2012-01-27 ENCOUNTER — Ambulatory Visit (INDEPENDENT_AMBULATORY_CARE_PROVIDER_SITE_OTHER): Payer: Medicare Other | Admitting: Internal Medicine

## 2012-01-27 ENCOUNTER — Encounter: Payer: Self-pay | Admitting: Internal Medicine

## 2012-01-27 VITALS — BP 160/86 | HR 80 | Temp 98.0°F | Resp 16 | Wt 236.0 lb

## 2012-01-27 DIAGNOSIS — E785 Hyperlipidemia, unspecified: Secondary | ICD-10-CM

## 2012-01-27 DIAGNOSIS — I10 Essential (primary) hypertension: Secondary | ICD-10-CM

## 2012-01-27 DIAGNOSIS — E119 Type 2 diabetes mellitus without complications: Secondary | ICD-10-CM

## 2012-01-27 DIAGNOSIS — I679 Cerebrovascular disease, unspecified: Secondary | ICD-10-CM | POA: Diagnosis not present

## 2012-01-27 LAB — HEMOGLOBIN A1C: Hgb A1c MFr Bld: 10.7 % — ABNORMAL HIGH (ref 4.6–6.5)

## 2012-01-27 NOTE — Patient Instructions (Signed)
Limit your sodium (Salt) intake   Please check your hemoglobin A1c every 3 months   

## 2012-01-27 NOTE — Progress Notes (Signed)
Subjective:    Patient ID: Lynn Hobbs, female    DOB: [birth year], 51 y.o.   MRN: [id number]  HPI  51 year old patient who is seen today for followup. She has CVD hypertension and diabetes. Her last hemoglobin A1c was quite elevated at greater than 10 after 2 previous hemoglobin A1c as were at goal. She states fasting blood sugars are generally very well controlled;  she is on Lantus insulin 80 units at bedtime and mealtime insulin 42 units prior to each meal. She feels well without concerns or complaints.  Wt Readings from Last 3 Encounters:  01/27/12 236 lb (107.049 kg)  10/27/11 232 lb (105.235 kg)  07/28/11 227 lb (102.967 kg)    Past Medical History  Diagnosis Date  . BENIGN NEOPLASM OF SKIN SITE UNSPECIFIED 09/08/2008  . CEREBROVASCULAR DISEASE 12/11/2008  . DIABETES MELLITUS, TYPE II 10/12/2006  . HYPERLIPIDEMIA 10/12/2006  . HYPERTENSION 10/12/2006  . PERIPHERAL NEUROPATHY 10/21/2006  . Endometriosis   . Fibroids     History   Social History  . Marital Status: Single    Spouse Name: N/A    Number of Children: N/A  . Years of Education: N/A   Occupational History  . Not on file.   Social History Main Topics  . Smoking status: Never Smoker   . Smokeless tobacco: Never Used  . Alcohol Use: No  . Drug Use: No  . Sexually Active: Not on file   Other Topics Concern  . Not on file   Social History Narrative  . No narrative on file    Past Surgical History  Procedure Date  . Abdominal hysterectomy     No family history on file.  No Known Allergies  Current Outpatient Prescriptions on File Prior to Visit  Medication Sig Dispense Refill  . amLODipine (NORVASC) 10 MG tablet Take 10 mg by mouth daily.        Marland Kitchen aspirin 325 MG tablet Take 325 mg by mouth daily.        . B-D UF III MINI PEN NEEDLES 31G X 5 MM MISC USE ONE FOUR TIMES DAILY WITH MEALS AND AT BEDTIEM  100 each  3  . benazepril (LOTENSIN) 40 MG tablet Take 40 mg by mouth daily.        . chlorthalidone  (HYGROTON) 25 MG tablet Take 25 mg by mouth daily.        . diphenoxylate-atropine (LOMOTIL) 2.5-0.025 MG per tablet Take 1 tablet by mouth 4 (four) times daily as needed.  30 tablet  2  . escitalopram (LEXAPRO) 20 MG tablet Take 20 mg by mouth daily.        Marland Kitchen gabapentin (NEURONTIN) 300 MG capsule Take 300 mg by mouth 3 (three) times daily.        Marland Kitchen glucose blood (BAYER CONTOUR TEST) test strip 1 each by Other route daily. Use as instructed       . insulin aspart (NOVOLOG FLEXPEN) 100 UNIT/ML injection Inject 30 units prior to each meal 3 times daily.  If blood sugar is in excess of 200 add an additional 10 units.  3 mL  6  . insulin glargine (LANTUS SOLOSTAR) 100 UNIT/ML injection Inject 80 Units into the skin at bedtime.  48 mL  3  . labetalol (NORMODYNE) 200 MG tablet Take 200 mg by mouth 2 (two) times daily.        . meclizine (ANTIVERT) 25 MG tablet Take 1 tablet (25 mg total) by mouth 3 (three) times  daily as needed for dizziness or nausea.  30 tablet  1  . potassium chloride (KLOR-CON) 20 MEQ packet Take 20 mEq by mouth daily.        . pravastatin (PRAVACHOL) 80 MG tablet Take 80 mg by mouth daily.          BP 160/86  Pulse 80  Temp 98 F (36.7 C) (Oral)  Resp 16  Wt 236 lb (107.049 kg)      Review of Systems  Constitutional: Negative.   HENT: Negative for hearing loss, congestion, sore throat, rhinorrhea, dental problem, sinus pressure and tinnitus.   Eyes: Negative for pain, discharge and visual disturbance.  Respiratory: Negative for cough and shortness of breath.   Cardiovascular: Negative for chest pain, palpitations and leg swelling.  Gastrointestinal: Negative for nausea, vomiting, abdominal pain, diarrhea, constipation, blood in stool and abdominal distention.  Genitourinary: Negative for dysuria, urgency, frequency, hematuria, flank pain, vaginal bleeding, vaginal discharge, difficulty urinating, vaginal pain and pelvic pain.  Musculoskeletal: Positive for gait  problem. Negative for joint swelling and arthralgias.  Skin: Negative for rash.  Neurological: Negative for dizziness, syncope, speech difficulty, weakness, numbness and headaches.  Hematological: Negative for adenopathy.  Psychiatric/Behavioral: Negative for behavioral problems, dysphoric mood and agitation. The patient is not nervous/anxious.        Objective:   Physical Exam  Constitutional: She is oriented to person, place, and time. She appears well-developed and well-nourished. No distress.       Overweight Blood pressure 140/80  HENT:  Head: Normocephalic.  Right Ear: External ear normal.  Left Ear: External ear normal.  Mouth/Throat: Oropharynx is clear and moist.  Eyes: Conjunctivae normal and EOM are normal. Pupils are equal, round, and reactive to light.  Neck: Normal range of motion. Neck supple. No thyromegaly present.  Cardiovascular: Normal rate, regular rhythm, normal heart sounds and intact distal pulses.   Pulmonary/Chest: Effort normal and breath sounds normal.  Abdominal: Soft. Bowel sounds are normal. She exhibits no mass. There is no tenderness.  Musculoskeletal: Normal range of motion.  Lymphadenopathy:    She has no cervical adenopathy.  Neurological: She is alert and oriented to person, place, and time.  Skin: Skin is warm and dry. No rash noted.  Psychiatric: She has a normal mood and affect. Her behavior is normal.          Assessment & Plan:       Diabetes mellitus. Will check a hemoglobin A1c. Weight loss encouraged  Hypertension stable  Cerebrovascular disease   Recheck 3 months

## 2012-03-30 ENCOUNTER — Other Ambulatory Visit: Payer: Self-pay | Admitting: Internal Medicine

## 2012-04-02 ENCOUNTER — Ambulatory Visit (INDEPENDENT_AMBULATORY_CARE_PROVIDER_SITE_OTHER): Payer: Medicare Other | Admitting: Family

## 2012-04-02 ENCOUNTER — Ambulatory Visit (INDEPENDENT_AMBULATORY_CARE_PROVIDER_SITE_OTHER)
Admission: RE | Admit: 2012-04-02 | Discharge: 2012-04-02 | Disposition: A | Discharge status: Home / Self Care | Payer: Medicare Other | Source: Ambulatory Visit | Attending: Family | Admitting: Family

## 2012-04-02 ENCOUNTER — Encounter: Payer: Self-pay | Admitting: Family

## 2012-04-02 VITALS — BP 170/90 | Temp 97.6°F | Wt 233.0 lb

## 2012-04-02 DIAGNOSIS — I2510 Atherosclerotic heart disease of native coronary artery without angina pectoris: Secondary | ICD-10-CM | POA: Diagnosis not present

## 2012-04-02 DIAGNOSIS — R51 Headache: Secondary | ICD-10-CM | POA: Diagnosis not present

## 2012-04-02 DIAGNOSIS — Z8673 Personal history of transient ischemic attack (TIA), and cerebral infarction without residual deficits: Secondary | ICD-10-CM

## 2012-04-02 DIAGNOSIS — E1165 Type 2 diabetes mellitus with hyperglycemia: Secondary | ICD-10-CM

## 2012-04-02 DIAGNOSIS — IMO0001 Reserved for inherently not codable concepts without codable children: Secondary | ICD-10-CM

## 2012-04-02 DIAGNOSIS — R531 Weakness: Secondary | ICD-10-CM

## 2012-04-02 DIAGNOSIS — M6281 Muscle weakness (generalized): Secondary | ICD-10-CM

## 2012-04-02 DIAGNOSIS — R29818 Other symptoms and signs involving the nervous system: Secondary | ICD-10-CM | POA: Diagnosis not present

## 2012-04-02 LAB — CBC WITH DIFFERENTIAL/PLATELET
Basophils Relative: 0.5 % (ref 0.0–3.0)
Eosinophils Relative: 1.1 % (ref 0.0–5.0)
Lymphocytes Relative: 30.4 % (ref 12.0–46.0)
Monocytes Absolute: 0.6 10*3/uL (ref 0.1–1.0)
Monocytes Relative: 8.6 % (ref 3.0–12.0)
Neutrophils Relative %: 59.4 % (ref 43.0–77.0)
Platelets: 218 10*3/uL (ref 150.0–400.0)
RBC: 5.44 Mil/uL — ABNORMAL HIGH (ref 3.87–5.11)
WBC: 7.5 10*3/uL (ref 4.5–10.5)

## 2012-04-02 LAB — BASIC METABOLIC PANEL
CO2: 28 mEq/L (ref 19–32)
Calcium: 9.8 mg/dL (ref 8.4–10.5)
Glucose, Bld: 306 mg/dL — ABNORMAL HIGH (ref 70–99)
Potassium: 4.4 mEq/L (ref 3.5–5.1)
Sodium: 136 mEq/L (ref 135–145)

## 2012-04-02 LAB — TSH: TSH: 1.13 u[IU]/mL (ref 0.35–5.50)

## 2012-04-02 NOTE — Progress Notes (Signed)
  Subjective:    Patient ID: Lynn Hobbs, female    DOB: [birth year], 52 y.o.   MRN: [id number]  HPI 52 year old female, patient of Dr. Kirtland Bouchard, nonsmoker, is in today with c/o feeling heavier in her left arm and leg side more so than usual. She reports symptoms beginning 5 days ago with a headache that subsided. The headache has returned today as a dull headache on the right side. Dr. Kirtland Bouchard changed her blood pressure medication from Benazapril to benicar 40mg . She is tolerating it well. She has a history of CVA with left sided residual weakness. She does not believe her strength is any worse but feels "heavier" on her left side. She has a history of uncontrolled Type 2 diabetes, CVD, hypertension. Denies and facial drooping, drooling,  slurred speech, or increase in facial weakness.  Blood sugars in the 120s fasting per patient. Postprandially, 90-110 per patient. Reports taken her medications as directed. History of CVA 2005.  Review of Systems  Constitutional: Negative.   HENT: Negative for drooling, trouble swallowing and voice change.   Eyes: Negative.   Respiratory: Negative.   Cardiovascular: Negative.   Gastrointestinal: Negative.   Musculoskeletal: Positive for gait problem.       Left-sided weakness unchanged. Complaints of left-sided heaviness  Skin: Negative.   Neurological: Positive for headaches. Negative for dizziness, speech difficulty, weakness and numbness.       Facial asymmetry unchanged from previous CVA  Hematological: Negative.   Psychiatric/Behavioral: Negative.        Objective:   Physical Exam  Constitutional: She is oriented to person, place, and time. She appears well-developed and well-nourished.  HENT:  Head: Normocephalic.  Right Ear: External ear normal.  Left Ear: External ear normal.  Nose: Nose normal.  Mouth/Throat: Oropharynx is clear and moist.  Eyes: Conjunctivae normal and EOM are normal. Pupils are equal, round, and reactive to light.  Neck: Normal  range of motion. Neck supple. No thyromegaly present.  Cardiovascular: Normal rate, regular rhythm and normal heart sounds.   Pulmonary/Chest: Effort normal and breath sounds normal.  Abdominal: Soft. Bowel sounds are normal.  Musculoskeletal:       Left side weakness-unchanged from baseline. Grip strength normal. Radial pulses 2 out of 2. Pedal pulses 2 out of 2.  Neurological: She is alert and oriented to person, place, and time. She has normal reflexes. She displays normal reflexes. A cranial nerve deficit is present. Coordination normal.       Facial nerve deficit-unchanged from baseline  Skin: Skin is warm and dry.  Psychiatric: She has a normal mood and affect.      Recheck blood pressure 176/90    Assessment & Plan:  Assessment:  1. Left side Heaviness 2. History of CVA 3. History of Left side Weakness 4. Headache 5. Type 2 diabetes-uncontrolled 6. Hypertension-uncontrolled.  Plan: Discussed case with Dr. Caryl Never in Dr. Kirtland Bouchard absence. CT scan of the head without contrast. Patient will leave the office today and have that done and lab our on [employer]. Lab sent. Advised patient that if symptoms similar to this occur in the future, she should go to the emergency department at the onset. Consider a carotid Doppler study. Strongly encouraged her to control her blood sugars. We'll follow with patient pending results of her CT scan. We'll adjust her blood pressure medication.

## 2012-04-28 ENCOUNTER — Encounter: Payer: Self-pay | Admitting: Internal Medicine

## 2012-04-28 ENCOUNTER — Ambulatory Visit (INDEPENDENT_AMBULATORY_CARE_PROVIDER_SITE_OTHER): Payer: Medicare Other | Admitting: Internal Medicine

## 2012-04-28 VITALS — BP 140/80 | HR 87 | Temp 98.5°F | Resp 18 | Wt 240.0 lb

## 2012-04-28 DIAGNOSIS — I679 Cerebrovascular disease, unspecified: Secondary | ICD-10-CM | POA: Diagnosis not present

## 2012-04-28 DIAGNOSIS — Z23 Encounter for immunization: Secondary | ICD-10-CM

## 2012-04-28 DIAGNOSIS — I10 Essential (primary) hypertension: Secondary | ICD-10-CM | POA: Diagnosis not present

## 2012-04-28 DIAGNOSIS — E119 Type 2 diabetes mellitus without complications: Secondary | ICD-10-CM

## 2012-04-28 MED ORDER — CANAGLIFLOZIN 300 MG PO TABS: 1.0000 | ORAL_TABLET | Freq: Every day | ORAL | Status: DC

## 2012-04-28 MED ORDER — METFORMIN HCL ER (MOD) 500 MG PO TB24: 500.0000 mg | ORAL_TABLET | Freq: Every day | ORAL | Status: DC

## 2012-04-28 NOTE — Patient Instructions (Signed)
Please check your hemoglobin A1c every 3 months  Limit your sodium (Salt) intake  Return in one month for follow-up

## 2012-04-28 NOTE — Progress Notes (Signed)
Subjective:    Patient ID: Lynn Hobbs, female    DOB: 09-12-1960, 52 y.o.   MRN: [id number]  HPI  52 year old patient who is seen today for followup she was seen last month with a complaint of heaviness involving the left arm she does have a history of cerebrovascular disease and multiple prior strokes and significant small vessel disease. No new neurological symptoms. She feels that the left arm still feels a bit heavier than the normal. Head CT revealed no definite acute findings although evidence of significant small vessel disease and prior strokes.  Her diabetes remains poorly controlled. Laboratory studies were checked last month and revealed a random blood sugar in excess of 300. Her last 2 hemoglobin A1c is have been consistently greater than 10. She has been intolerant of metformin in the past due to diarrhea  Past Medical History  Diagnosis Date  . BENIGN NEOPLASM OF SKIN SITE UNSPECIFIED 09/08/2008  . CEREBROVASCULAR DISEASE 12/11/2008  . DIABETES MELLITUS, TYPE II 10/12/2006  . HYPERLIPIDEMIA 10/12/2006  . HYPERTENSION 10/12/2006  . PERIPHERAL NEUROPATHY 10/21/2006  . Endometriosis   . Fibroids     History   Social History  . Marital Status: Single    Spouse Name: N/A    Number of Children: N/A  . Years of Education: N/A   Occupational History  . Not on file.   Social History Main Topics  . Smoking status: Never Smoker   . Smokeless tobacco: Never Used  . Alcohol Use: No  . Drug Use: No  . Sexually Active: Not on file   Other Topics Concern  . Not on file   Social History Narrative  . No narrative on file    Past Surgical History  Procedure Laterality Date  . Abdominal hysterectomy      No family history on file.  No Known Allergies  Current Outpatient Prescriptions on File Prior to Visit  Medication Sig Dispense Refill  . amLODipine (NORVASC) 10 MG tablet Take 10 mg by mouth daily.        Marland Kitchen aspirin 325 MG tablet Take 325 mg by mouth daily.        .  B-D UF III MINI PEN NEEDLES 31G X 5 MM MISC USE ONE FOUR TIMES DAILY WITH MEALS AND AT BEDTIEM  100 each  3  . chlorthalidone (HYGROTON) 25 MG tablet Take 25 mg by mouth daily.        . diphenoxylate-atropine (LOMOTIL) 2.5-0.025 MG per tablet Take 1 tablet by mouth 4 (four) times daily as needed.  30 tablet  2  . escitalopram (LEXAPRO) 20 MG tablet Take 20 mg by mouth daily.        Marland Kitchen gabapentin (NEURONTIN) 300 MG capsule Take 300 mg by mouth 3 (three) times daily.        Marland Kitchen glucose blood (BAYER CONTOUR TEST) test strip 1 each by Other route daily. Use as instructed       . insulin aspart (NOVOLOG) 100 UNIT/ML injection Inject 42 units prior to each meal 3 times daily.  If blood sugar is in excess of 200 add an additional 10 units.      Marland Kitchen labetalol (NORMODYNE) 200 MG tablet Take 200 mg by mouth 2 (two) times daily.        Marland Kitchen LANTUS SOLOSTAR 100 UNIT/ML injection INJECT 80 UNITS SUBCUTANEOUSLY EVERY DAY AT BEDTIME  30 mL  2  . meclizine (ANTIVERT) 25 MG tablet Take 1 tablet (25 mg total) by mouth 3 (  three) times daily as needed for dizziness or nausea.  30 tablet  1  . olmesartan (BENICAR) 40 MG tablet Take 40 mg by mouth daily.      . potassium chloride (KLOR-CON) 20 MEQ packet Take 20 mEq by mouth daily.        . pravastatin (PRAVACHOL) 80 MG tablet Take 80 mg by mouth daily.         No current facility-administered medications on file prior to visit.    BP 140/80  Pulse 87  Temp(Src) 98.5 F (36.9 C) (Oral)  Resp 18  Wt 240 lb (108.863 kg)  BMI 38.76 kg/m2  SpO2 98%       Review of Systems  HENT: Negative for hearing loss, congestion, sore throat, rhinorrhea, dental problem, sinus pressure and tinnitus.   Eyes: Negative for pain, discharge and visual disturbance.  Respiratory: Negative for cough and shortness of breath.   Cardiovascular: Negative for chest pain, palpitations and leg swelling.  Gastrointestinal: Negative for nausea, vomiting, abdominal pain, diarrhea, constipation,  blood in stool and abdominal distention.  Genitourinary: Negative for dysuria, urgency, frequency, hematuria, flank pain, vaginal bleeding, vaginal discharge, difficulty urinating, vaginal pain and pelvic pain.  Musculoskeletal: Negative for joint swelling, arthralgias and gait problem.  Skin: Negative for rash.  Neurological: Positive for weakness and numbness. Negative for dizziness, syncope, speech difficulty and headaches.  Hematological: Negative for adenopathy.  Psychiatric/Behavioral: Negative for behavioral problems, dysphoric mood and agitation. The patient is not nervous/anxious.        Objective:   Physical Exam  Constitutional: She is oriented to person, place, and time. She appears well-developed and well-nourished.  HENT:  Head: Normocephalic.  Right Ear: External ear normal.  Left Ear: External ear normal.  Mouth/Throat: Oropharynx is clear and moist.  Eyes: Conjunctivae and EOM are normal. Pupils are equal, round, and reactive to light.  Neck: Normal range of motion. Neck supple. No thyromegaly present.  Cardiovascular: Normal rate, regular rhythm, normal heart sounds and intact distal pulses.   Pulmonary/Chest: Effort normal and breath sounds normal.  Abdominal: Soft. Bowel sounds are normal. She exhibits no mass. There is no tenderness.  Musculoskeletal: Normal range of motion.  Lymphadenopathy:    She has no cervical adenopathy.  Neurological: She is alert and oriented to person, place, and time.  Chronic left-sided weakness  Skin: Skin is warm and dry. No rash noted.  Psychiatric: She has a normal mood and affect. Her behavior is normal.          Assessment & Plan:   Diabetes mellitus. The patient is on  Lantus 83 units and a sliding scale regimen of NovoLog. She takes 42 units prior to each meal 3 times daily and if blood sugar is greater than 200 and additional 10 units. We'll add invocanna 100 mg daily for 2 weeks and then 300 mg daily; we'll rechallenge  on metformin 500 mg extended release once daily with her largest meal Hypertension. Fair control repeat blood pressure 140/76

## 2012-06-02 ENCOUNTER — Ambulatory Visit (INDEPENDENT_AMBULATORY_CARE_PROVIDER_SITE_OTHER): Payer: Medicare Other | Admitting: Internal Medicine

## 2012-06-02 ENCOUNTER — Encounter: Payer: Self-pay | Admitting: Internal Medicine

## 2012-06-02 VITALS — BP 140/86 | HR 78 | Temp 98.0°F | Resp 18 | Wt 238.0 lb

## 2012-06-02 DIAGNOSIS — E119 Type 2 diabetes mellitus without complications: Secondary | ICD-10-CM | POA: Diagnosis not present

## 2012-06-02 DIAGNOSIS — I10 Essential (primary) hypertension: Secondary | ICD-10-CM

## 2012-06-02 DIAGNOSIS — G609 Hereditary and idiopathic neuropathy, unspecified: Secondary | ICD-10-CM | POA: Diagnosis not present

## 2012-06-02 MED ORDER — CANAGLIFLOZIN 300 MG PO TABS: 1.0000 | ORAL_TABLET | Freq: Every day | ORAL | Status: DC

## 2012-06-02 NOTE — Progress Notes (Signed)
Subjective:    Patient ID: Lynn Hobbs, female    DOB: 02/25/1961, 52 y.o.   MRN: [id number]  HPI   52 year old patient who is seen today for followup of diabetes. She is on the moderately high dose basal and mealtime insulin and was rechallenged with metformin one month ago. She was placed on extended release 500 mg and did have some diarrhea that now has resolved. She was also placed on Innvokana which was approved.  She feels her diabetic control has improved. She states fasting blood sugars are often less than 100. Blood sugars through the day are never over 200.  Past Medical History  Diagnosis Date  . BENIGN NEOPLASM OF SKIN SITE UNSPECIFIED 09/08/2008  . CEREBROVASCULAR DISEASE 12/11/2008  . DIABETES MELLITUS, TYPE II 10/12/2006  . HYPERLIPIDEMIA 10/12/2006  . HYPERTENSION 10/12/2006  . PERIPHERAL NEUROPATHY 10/21/2006  . Endometriosis   . Fibroids     History   Social History  . Marital Status: Single    Spouse Name: N/A    Number of Children: N/A  . Years of Education: N/A   Occupational History  . Not on file.   Social History Main Topics  . Smoking status: Never Smoker   . Smokeless tobacco: Never Used  . Alcohol Use: No  . Drug Use: No  . Sexually Active: Not on file   Other Topics Concern  . Not on file   Social History Narrative  . No narrative on file    Past Surgical History  Procedure Laterality Date  . Abdominal hysterectomy      No family history on file.  No Known Allergies  Current Outpatient Prescriptions on File Prior to Visit  Medication Sig Dispense Refill  . amLODipine (NORVASC) 10 MG tablet Take 10 mg by mouth daily.        Marland Kitchen aspirin 325 MG tablet Take 325 mg by mouth daily.        . B-D UF III MINI PEN NEEDLES 31G X 5 MM MISC USE ONE FOUR TIMES DAILY WITH MEALS AND AT BEDTIEM  100 each  3  . Canagliflozin (INVOKANA) 300 MG TABS Take 1 tablet by mouth daily.  90 tablet  6  . chlorthalidone (HYGROTON) 25 MG tablet Take 25 mg by mouth  daily.        . diphenoxylate-atropine (LOMOTIL) 2.5-0.025 MG per tablet Take 1 tablet by mouth 4 (four) times daily as needed.  30 tablet  2  . escitalopram (LEXAPRO) 20 MG tablet Take 20 mg by mouth daily.        Marland Kitchen gabapentin (NEURONTIN) 300 MG capsule Take 300 mg by mouth 3 (three) times daily.        Marland Kitchen glucose blood (BAYER CONTOUR TEST) test strip 1 each by Other route daily. Use as instructed       . insulin aspart (NOVOLOG) 100 UNIT/ML injection Inject 42 units prior to each meal 3 times daily.  If blood sugar is in excess of 200 add an additional 10 units.      Marland Kitchen labetalol (NORMODYNE) 200 MG tablet Take 200 mg by mouth 2 (two) times daily.        Marland Kitchen LANTUS SOLOSTAR 100 UNIT/ML injection INJECT 80 UNITS SUBCUTANEOUSLY EVERY DAY AT BEDTIME  30 mL  2  . meclizine (ANTIVERT) 25 MG tablet Take 1 tablet (25 mg total) by mouth 3 (three) times daily as needed for dizziness or nausea.  30 tablet  1  . metFORMIN (GLUMETZA) 500  MG (MOD) 24 hr tablet Take 1 tablet (500 mg total) by mouth daily with breakfast.  90 tablet  5  . olmesartan (BENICAR) 40 MG tablet Take 40 mg by mouth daily.      . potassium chloride (KLOR-CON) 20 MEQ packet Take 20 mEq by mouth daily.        . pravastatin (PRAVACHOL) 80 MG tablet Take 80 mg by mouth daily.         No current facility-administered medications on file prior to visit.    BP 140/86  Pulse 78  Temp(Src) 98 F (36.7 C) (Oral)  Resp 18  Wt 238 lb (107.956 kg)  BMI 38.43 kg/m2  SpO2 98%       Review of Systems  Constitutional: Negative.   HENT: Negative for hearing loss, congestion, sore throat, rhinorrhea, dental problem, sinus pressure and tinnitus.   Eyes: Negative for pain, discharge and visual disturbance.  Respiratory: Negative for cough and shortness of breath.   Cardiovascular: Negative for chest pain, palpitations and leg swelling.  Gastrointestinal: Negative for nausea, vomiting, abdominal pain, diarrhea, constipation, blood in stool and  abdominal distention.  Genitourinary: Negative for dysuria, urgency, frequency, hematuria, flank pain, vaginal bleeding, vaginal discharge, difficulty urinating, vaginal pain and pelvic pain.  Musculoskeletal: Negative for joint swelling, arthralgias and gait problem.  Skin: Negative for rash.  Neurological: Negative for dizziness, syncope, speech difficulty, weakness, numbness and headaches.  Hematological: Negative for adenopathy.  Psychiatric/Behavioral: Negative for behavioral problems, dysphoric mood and agitation. The patient is not nervous/anxious.        Objective:   Physical Exam  Constitutional: She appears well-nourished. No distress.  Blood pressure 130/80          Assessment & Plan:   Diabetes mellitus. Will continue present regimen  Recheck 3 months  hypertension Cerebrovascular disease

## 2012-06-02 NOTE — Patient Instructions (Signed)
Limit your sodium (Salt) intake   Please check your hemoglobin A1c every 3 months   

## 2012-06-17 ENCOUNTER — Ambulatory Visit (INDEPENDENT_AMBULATORY_CARE_PROVIDER_SITE_OTHER): Payer: Medicare Other | Admitting: Ophthalmology

## 2012-06-17 DIAGNOSIS — E1165 Type 2 diabetes mellitus with hyperglycemia: Secondary | ICD-10-CM

## 2012-06-17 DIAGNOSIS — H35039 Hypertensive retinopathy, unspecified eye: Secondary | ICD-10-CM | POA: Diagnosis not present

## 2012-06-17 DIAGNOSIS — H2510 Age-related nuclear cataract, unspecified eye: Secondary | ICD-10-CM

## 2012-06-17 DIAGNOSIS — I10 Essential (primary) hypertension: Secondary | ICD-10-CM | POA: Diagnosis not present

## 2012-06-17 DIAGNOSIS — E11359 Type 2 diabetes mellitus with proliferative diabetic retinopathy without macular edema: Secondary | ICD-10-CM

## 2012-06-17 DIAGNOSIS — H43819 Vitreous degeneration, unspecified eye: Secondary | ICD-10-CM

## 2012-06-17 DIAGNOSIS — E1139 Type 2 diabetes mellitus with other diabetic ophthalmic complication: Secondary | ICD-10-CM | POA: Diagnosis not present

## 2012-06-29 ENCOUNTER — Other Ambulatory Visit: Payer: Self-pay | Admitting: Internal Medicine

## 2012-09-01 ENCOUNTER — Encounter: Payer: Self-pay | Admitting: Internal Medicine

## 2012-09-01 ENCOUNTER — Ambulatory Visit (INDEPENDENT_AMBULATORY_CARE_PROVIDER_SITE_OTHER): Payer: Medicare Other | Admitting: Internal Medicine

## 2012-09-01 VITALS — BP 159/90 | HR 91 | Temp 98.7°F | Resp 20 | Wt 238.0 lb

## 2012-09-01 DIAGNOSIS — E1149 Type 2 diabetes mellitus with other diabetic neurological complication: Secondary | ICD-10-CM | POA: Diagnosis not present

## 2012-09-01 DIAGNOSIS — E119 Type 2 diabetes mellitus without complications: Secondary | ICD-10-CM

## 2012-09-01 DIAGNOSIS — I679 Cerebrovascular disease, unspecified: Secondary | ICD-10-CM

## 2012-09-01 DIAGNOSIS — I10 Essential (primary) hypertension: Secondary | ICD-10-CM

## 2012-09-01 DIAGNOSIS — E1142 Type 2 diabetes mellitus with diabetic polyneuropathy: Secondary | ICD-10-CM | POA: Diagnosis not present

## 2012-09-01 MED ORDER — DIPHENOXYLATE-ATROPINE 2.5-0.025 MG PO TABS: 1.0000 | ORAL_TABLET | Freq: Four times a day (QID) | ORAL | Status: DC | PRN

## 2012-09-01 NOTE — Patient Instructions (Signed)
Limit your sodium (Salt) intake   Please check your hemoglobin A1c every 3 months   

## 2012-09-01 NOTE — Progress Notes (Signed)
Subjective:    Patient ID: Lynn Hobbs, female    DOB: 28-Jan-1961, 52 y.o.   MRN: [id number]  HPI  52 year old patient who is seen today for followup. She has type 2 diabetes controlled on basal and mealtime insulin. Presently she is on Lantus 80 units at bedtime and 42 units of NovoLog prior to each meal additionally she is on Invokanna and metformin. She has cerebrovascular disease and treated hypertension which have been stable. She has a peripheral neuropathy. She remains on pravastatin for dyslipidemia.  Past Medical History  Diagnosis Date  . BENIGN NEOPLASM OF SKIN SITE UNSPECIFIED 09/08/2008  . CEREBROVASCULAR DISEASE 12/11/2008  . DIABETES MELLITUS, TYPE II 10/12/2006  . HYPERLIPIDEMIA 10/12/2006  . HYPERTENSION 10/12/2006  . PERIPHERAL NEUROPATHY 10/21/2006  . Endometriosis   . Fibroids     History   Social History  . Marital Status: Single    Spouse Name: N/A    Number of Children: N/A  . Years of Education: N/A   Occupational History  . Not on file.   Social History Main Topics  . Smoking status: Never Smoker   . Smokeless tobacco: Never Used  . Alcohol Use: No  . Drug Use: No  . Sexually Active: Not on file   Other Topics Concern  . Not on file   Social History Narrative  . No narrative on file    Past Surgical History  Procedure Laterality Date  . Abdominal hysterectomy      No family history on file.  No Known Allergies  Current Outpatient Prescriptions on File Prior to Visit  Medication Sig Dispense Refill  . amLODipine (NORVASC) 10 MG tablet Take 10 mg by mouth daily.        Marland Kitchen aspirin 325 MG tablet Take 325 mg by mouth daily.        . B-D UF III MINI PEN NEEDLES 31G X 5 MM MISC USE ONE FOUR TIMES DAILY WITH MEALS AND AT BEDTIEM  100 each  3  . Canagliflozin (INVOKANA) 300 MG TABS Take 1 tablet by mouth daily.  90 tablet  6  . chlorthalidone (HYGROTON) 25 MG tablet Take 25 mg by mouth daily.        Marland Kitchen escitalopram (LEXAPRO) 20 MG tablet Take 20 mg  by mouth daily.        Marland Kitchen gabapentin (NEURONTIN) 300 MG capsule Take 300 mg by mouth 3 (three) times daily.        Marland Kitchen glucose blood (BAYER CONTOUR TEST) test strip 1 each by Other route daily. Use as instructed       . labetalol (NORMODYNE) 200 MG tablet Take 200 mg by mouth 2 (two) times daily.        Marland Kitchen LANTUS SOLOSTAR 100 UNIT/ML injection INJECT 80 UNITS SUBCUTANEOUSLY EVERY DAY AT BEDTIME  30 mL  2  . metFORMIN (GLUMETZA) 500 MG (MOD) 24 hr tablet Take 1 tablet (500 mg total) by mouth daily with breakfast.  90 tablet  5  . olmesartan (BENICAR) 40 MG tablet Take 40 mg by mouth daily.      . potassium chloride (KLOR-CON) 20 MEQ packet Take 20 mEq by mouth daily.        . pravastatin (PRAVACHOL) 80 MG tablet Take 80 mg by mouth daily.         No current facility-administered medications on file prior to visit.    BP 159/90  Pulse 91  Temp(Src) 98.7 F (37.1 C) (Oral)  Resp 20  Wt 238 lb (107.956 kg)  BMI 38.43 kg/m2  SpO2 99%       Review of Systems  HENT: Negative for hearing loss, congestion, sore throat, rhinorrhea, dental problem, sinus pressure and tinnitus.   Eyes: Negative for pain, discharge and visual disturbance.  Respiratory: Negative for cough and shortness of breath.   Cardiovascular: Negative for chest pain, palpitations and leg swelling.  Gastrointestinal: Negative for nausea, vomiting, abdominal pain, diarrhea, constipation, blood in stool and abdominal distention.  Genitourinary: Positive for frequency. Negative for dysuria, urgency, hematuria, flank pain, vaginal bleeding, vaginal discharge, difficulty urinating, vaginal pain and pelvic pain.  Musculoskeletal: Positive for gait problem. Negative for joint swelling and arthralgias.  Skin: Negative for rash.  Neurological: Positive for weakness and numbness. Negative for dizziness, syncope, speech difficulty and headaches.  Hematological: Negative for adenopathy.  Psychiatric/Behavioral: Negative for behavioral  problems, dysphoric mood and agitation. The patient is not nervous/anxious.        Objective:   Physical Exam  Constitutional: She is oriented to person, place, and time. She appears well-developed and well-nourished.  HENT:  Head: Normocephalic.  Right Ear: External ear normal.  Left Ear: External ear normal.  Mouth/Throat: Oropharynx is clear and moist.  Eyes: Conjunctivae and EOM are normal. Pupils are equal, round, and reactive to light.  Neck: Normal range of motion. Neck supple. No thyromegaly present.  Cardiovascular: Normal rate, regular rhythm, normal heart sounds and intact distal pulses.   Pulmonary/Chest: Effort normal and breath sounds normal.  Abdominal: Soft. Bowel sounds are normal. She exhibits no mass. There is no tenderness.  Musculoskeletal: Normal range of motion.  Lymphadenopathy:    She has no cervical adenopathy.  Neurological: She is alert and oriented to person, place, and time.  Skin: Skin is warm and dry. No rash noted.  Psychiatric: She has a normal mood and affect. Her behavior is normal.          Assessment & Plan:   Diabetes mellitus. Will check a hemoglobin A1c Cerebrovascular disease Hypertension dyslipidemia. Continue pravastatin

## 2012-10-27 ENCOUNTER — Other Ambulatory Visit: Payer: Self-pay | Admitting: Internal Medicine

## 2012-10-29 ENCOUNTER — Other Ambulatory Visit: Payer: Self-pay | Admitting: Internal Medicine

## 2012-12-02 ENCOUNTER — Ambulatory Visit (INDEPENDENT_AMBULATORY_CARE_PROVIDER_SITE_OTHER): Payer: Medicare Other | Admitting: Internal Medicine

## 2012-12-02 ENCOUNTER — Encounter: Payer: Self-pay | Admitting: Internal Medicine

## 2012-12-02 VITALS — BP 150/90 | HR 87 | Temp 98.2°F | Resp 20 | Wt 242.0 lb

## 2012-12-02 DIAGNOSIS — E1149 Type 2 diabetes mellitus with other diabetic neurological complication: Secondary | ICD-10-CM | POA: Diagnosis not present

## 2012-12-02 DIAGNOSIS — E785 Hyperlipidemia, unspecified: Secondary | ICD-10-CM | POA: Diagnosis not present

## 2012-12-02 DIAGNOSIS — E1142 Type 2 diabetes mellitus with diabetic polyneuropathy: Secondary | ICD-10-CM

## 2012-12-02 DIAGNOSIS — I10 Essential (primary) hypertension: Secondary | ICD-10-CM | POA: Diagnosis not present

## 2012-12-02 DIAGNOSIS — I679 Cerebrovascular disease, unspecified: Secondary | ICD-10-CM | POA: Diagnosis not present

## 2012-12-02 DIAGNOSIS — G609 Hereditary and idiopathic neuropathy, unspecified: Secondary | ICD-10-CM

## 2012-12-02 LAB — COMPREHENSIVE METABOLIC PANEL
ALT: 17 U/L (ref 0–35)
AST: 20 U/L (ref 0–37)
Albumin: 4.1 g/dL (ref 3.5–5.2)
Alkaline Phosphatase: 56 U/L (ref 39–117)
Calcium: 9.6 mg/dL (ref 8.4–10.5)
Chloride: 99 mEq/L (ref 96–112)
Creatinine, Ser: 1 mg/dL (ref 0.4–1.2)
GFR: 74.79 mL/min (ref >60.00)
Potassium: 4.7 mEq/L (ref 3.5–5.1)
Sodium: 136 mEq/L (ref 135–145)
Total Protein: 8 g/dL (ref 6.0–8.3)

## 2012-12-02 LAB — LIPID PANEL
Cholesterol: 304 mg/dL — ABNORMAL HIGH (ref 0–200)
HDL: 52.4 mg/dL (ref >39.00)
Total CHOL/HDL Ratio: 6
VLDL: 36.4 mg/dL (ref 0.0–40.0)

## 2012-12-02 LAB — LDL CHOLESTEROL, DIRECT: Direct LDL: 214.4 mg/dL

## 2012-12-02 LAB — MICROALBUMIN / CREATININE URINE RATIO: Microalb Creat Ratio: 53.8 mg/g — ABNORMAL HIGH (ref 0.0–30.0)

## 2012-12-02 MED ORDER — LABETALOL HCL 300 MG PO TABS: 300.0000 mg | ORAL_TABLET | Freq: Two times a day (BID) | ORAL | Status: DC

## 2012-12-02 MED ORDER — AMLODIPINE BESYLATE 10 MG PO TABS: ORAL_TABLET | ORAL | Status: DC

## 2012-12-02 MED ORDER — INSULIN PEN NEEDLE 31G X 5 MM MISC: Status: DC

## 2012-12-02 MED ORDER — CANAGLIFLOZIN 100 MG PO TABS: 1.0000 | ORAL_TABLET | Freq: Every day | ORAL | Status: DC

## 2012-12-02 MED ORDER — LABETALOL HCL 200 MG PO TABS: 200.0000 mg | ORAL_TABLET | Freq: Two times a day (BID) | ORAL | Status: DC

## 2012-12-02 MED ORDER — ASPIRIN 81 MG PO TABS: 81.0000 mg | ORAL_TABLET | Freq: Every day | ORAL | Status: DC

## 2012-12-02 MED ORDER — ATORVASTATIN CALCIUM 80 MG PO TABS: 80.0000 mg | ORAL_TABLET | Freq: Every day | ORAL | Status: DC

## 2012-12-02 MED ORDER — INSULIN GLARGINE 100 UNIT/ML SOLOSTAR PEN: PEN_INJECTOR | SUBCUTANEOUS | Status: DC

## 2012-12-02 NOTE — Patient Instructions (Signed)
Limit your sodium (Salt) intake  You need to lose weight.  Consider a lower calorie diet and regular exercise.   Please check your hemoglobin A1c every 3 months   

## 2012-12-02 NOTE — Progress Notes (Signed)
Subjective:    Patient ID: Lynn Hobbs, female    DOB: April 30, 1960, 52 y.o.   MRN: [id number]  HPI  52 year old patient who was seen for her coronary followup. She has a history of diabetes mellitus with significant insulin resistance. At the present time she is on Lantus insulin 80 units at bedtime. She states fasting blood sugars are usually in the 90s. She is on 42 units at mealtime insulin. Her last hemoglobin A1c 8.1. She states she is having increased urinary frequency with Invokanna and has requested a dose reduction. She has significant multi-drug-resistant hypertension. There is no modest weight gain over the last 3 months She has cerebrovascular disease with residual left-sided weakness Diabetes is complicated  by peripheral neuropathy   She is on a submaximal dose of metformin but unable to tolerate more intensive therapy  Patient is scheduled for eye examination later next month   Wt Readings from Last 3 Encounters:  12/02/12 242 lb (109.77 kg)  09/01/12 238 lb (107.956 kg)  06/02/12 238 lb (107.956 kg)    BP Readings from Last 3 Encounters:  12/02/12 150/90  09/01/12 159/90  06/02/12 140/86    Past Medical History  Diagnosis Date  . BENIGN NEOPLASM OF SKIN SITE UNSPECIFIED 09/08/2008  . CEREBROVASCULAR DISEASE 12/11/2008  . DIABETES MELLITUS, TYPE II 10/12/2006  . HYPERLIPIDEMIA 10/12/2006  . HYPERTENSION 10/12/2006  . PERIPHERAL NEUROPATHY 10/21/2006  . Endometriosis   . Fibroids     History   Social History  . Marital Status: Single    Spouse Name: N/A    Number of Children: N/A  . Years of Education: N/A   Occupational History  . Not on file.   Social History Main Topics  . Smoking status: Never Smoker   . Smokeless tobacco: Never Used  . Alcohol Use: No  . Drug Use: No  . Sexual Activity: Not on file   Other Topics Concern  . Not on file   Social History Narrative  . No narrative on file    Past Surgical History  Procedure Laterality Date   . Abdominal hysterectomy      No family history on file.  No Known Allergies  Current Outpatient Prescriptions on File Prior to Visit  Medication Sig Dispense Refill  . aspirin 325 MG tablet Take 325 mg by mouth daily.        . Canagliflozin (INVOKANA) 300 MG TABS Take 1 tablet by mouth daily.  90 tablet  6  . chlorthalidone (HYGROTON) 25 MG tablet Take 25 mg by mouth daily.        . diphenoxylate-atropine (LOMOTIL) 2.5-0.025 MG per tablet Take 1 tablet by mouth 4 (four) times daily as needed.  30 tablet  2  . escitalopram (LEXAPRO) 20 MG tablet Take 20 mg by mouth daily.        Marland Kitchen gabapentin (NEURONTIN) 300 MG capsule Take 300 mg by mouth 3 (three) times daily.        Marland Kitchen glucose blood (BAYER CONTOUR TEST) test strip 1 each by Other route daily. Use as instructed       . insulin aspart (NOVOLOG FLEXPEN) 100 UNIT/ML injection       . metFORMIN (GLUMETZA) 500 MG (MOD) 24 hr tablet Take 1 tablet (500 mg total) by mouth daily with breakfast.  90 tablet  5  . olmesartan (BENICAR) 40 MG tablet Take 40 mg by mouth daily.      . potassium chloride (KLOR-CON) 20 MEQ packet Take 20  mEq by mouth daily.        . pravastatin (PRAVACHOL) 80 MG tablet Take 80 mg by mouth daily.         No current facility-administered medications on file prior to visit.    BP 150/90  Pulse 87  Temp(Src) 98.2 F (36.8 C) (Oral)  Resp 20  Wt 242 lb (109.77 kg)  BMI 39.08 kg/m2  SpO2 98%     Review of Systems  Constitutional: Positive for fatigue.  HENT: Negative for hearing loss, congestion, sore throat, rhinorrhea, dental problem, sinus pressure and tinnitus.   Eyes: Negative for pain, discharge and visual disturbance.  Respiratory: Negative for cough and shortness of breath.   Cardiovascular: Negative for chest pain, palpitations and leg swelling.  Gastrointestinal: Negative for nausea, vomiting, abdominal pain, diarrhea, constipation, blood in stool and abdominal distention.  Genitourinary: Positive for  frequency. Negative for dysuria, urgency, hematuria, flank pain, vaginal bleeding, vaginal discharge, difficulty urinating, vaginal pain and pelvic pain.  Musculoskeletal: Negative for joint swelling, arthralgias and gait problem.  Skin: Negative for rash.  Neurological: Positive for weakness and numbness. Negative for dizziness, syncope, speech difficulty and headaches.  Hematological: Negative for adenopathy.  Psychiatric/Behavioral: Negative for behavioral problems, dysphoric mood and agitation. The patient is not nervous/anxious.        Objective:   Physical Exam  Constitutional: She is oriented to person, place, and time. She appears well-developed and well-nourished.  Weight 242 Blood pressure 150/90 on arrival 140/90 on repeat  HENT:  Head: Normocephalic.  Right Ear: External ear normal.  Left Ear: External ear normal.  Mouth/Throat: Oropharynx is clear and moist.  Eyes: Conjunctivae and EOM are normal. Pupils are equal, round, and reactive to light.  Neck: Normal range of motion. Neck supple. No thyromegaly present.  Cardiovascular: Normal rate, regular rhythm, normal heart sounds and intact distal pulses.   Pulmonary/Chest: Effort normal and breath sounds normal.  Abdominal: Soft. Bowel sounds are normal. She exhibits no mass. There is no tenderness.  Musculoskeletal: Normal range of motion.  Lymphadenopathy:    She has no cervical adenopathy.  Neurological: She is alert and oriented to person, place, and time.  Left-sided weakness and hypesthesia  Skin: Skin is warm and dry. No rash noted.  Psychiatric: She has a normal mood and affect. Her behavior is normal.          Assessment & Plan:   Diabetes mellitus. We'll check a hemoglobin A1c. At her request will down titrate Invokanna to 100 mg daily Hypertension. Suboptimal control. We'll up titrate labetalol to 300 mg twice daily Dyslipidemia. No history of statin intolerance. Will switch to high intensity statin  therapy with atorvastatin 80 Cerebrovascular disease

## 2012-12-03 ENCOUNTER — Other Ambulatory Visit: Payer: Self-pay | Admitting: Internal Medicine

## 2012-12-03 DIAGNOSIS — E1149 Type 2 diabetes mellitus with other diabetic neurological complication: Secondary | ICD-10-CM

## 2012-12-22 ENCOUNTER — Ambulatory Visit (INDEPENDENT_AMBULATORY_CARE_PROVIDER_SITE_OTHER): Payer: Medicare Other | Admitting: Ophthalmology

## 2012-12-22 DIAGNOSIS — I10 Essential (primary) hypertension: Secondary | ICD-10-CM

## 2012-12-22 DIAGNOSIS — E11311 Type 2 diabetes mellitus with unspecified diabetic retinopathy with macular edema: Secondary | ICD-10-CM | POA: Diagnosis not present

## 2012-12-22 DIAGNOSIS — E1139 Type 2 diabetes mellitus with other diabetic ophthalmic complication: Secondary | ICD-10-CM | POA: Diagnosis not present

## 2012-12-22 DIAGNOSIS — H35039 Hypertensive retinopathy, unspecified eye: Secondary | ICD-10-CM

## 2012-12-22 DIAGNOSIS — H43819 Vitreous degeneration, unspecified eye: Secondary | ICD-10-CM

## 2012-12-22 DIAGNOSIS — E11359 Type 2 diabetes mellitus with proliferative diabetic retinopathy without macular edema: Secondary | ICD-10-CM | POA: Diagnosis not present

## 2012-12-22 DIAGNOSIS — H2510 Age-related nuclear cataract, unspecified eye: Secondary | ICD-10-CM

## 2012-12-24 ENCOUNTER — Ambulatory Visit (INDEPENDENT_AMBULATORY_CARE_PROVIDER_SITE_OTHER): Payer: Medicare Other | Admitting: Internal Medicine

## 2012-12-24 ENCOUNTER — Encounter: Payer: Self-pay | Admitting: Internal Medicine

## 2012-12-24 ENCOUNTER — Ambulatory Visit: Payer: Medicare Other | Admitting: Internal Medicine

## 2012-12-24 VITALS — BP 132/100 | HR 90 | Temp 97.9°F | Resp 12 | Ht 67.0 in | Wt 240.0 lb

## 2012-12-24 DIAGNOSIS — E1142 Type 2 diabetes mellitus with diabetic polyneuropathy: Secondary | ICD-10-CM

## 2012-12-24 DIAGNOSIS — E1149 Type 2 diabetes mellitus with other diabetic neurological complication: Secondary | ICD-10-CM | POA: Diagnosis not present

## 2012-12-24 MED ORDER — INSULIN GLARGINE 100 UNIT/ML SOLOSTAR PEN: PEN_INJECTOR | SUBCUTANEOUS | Status: DC

## 2012-12-24 MED ORDER — INSULIN ASPART 100 UNIT/ML ~~LOC~~ SOLN: SUBCUTANEOUS | Status: DC

## 2012-12-24 MED ORDER — GLUCOSE BLOOD VI STRP: ORAL_STRIP | Status: DC

## 2012-12-24 MED ORDER — METFORMIN HCL ER (MOD) 500 MG PO TB24: 1000.0000 mg | ORAL_TABLET | Freq: Two times a day (BID) | ORAL | Status: DC

## 2012-12-24 NOTE — Progress Notes (Signed)
Patient ID: Lynn Hobbs, female   DOB: 1960-04-14, 52 y.o.   MRN: [id number]  HPI: Lynn Hobbs is a 52 y.o.-year-old female, referred by her PCP, Dr.Kwiatkowski, for management of DM2, insulin-dependent, uncontrolled, with complications (?peripheral neuropathy, cerebrovascular ds, DR). She is here with her mother, who is also diabetic, and offers part of the history.  Patient has been diagnosed with diabetes in ~2004; she started insulin a little after dx.  Last hemoglobin A1c was: Lab Results  Component Value Date   HGBA1C 10.7* 12/02/2012   HGBA1C 8.1* 09/01/2012   HGBA1C 10.7* 01/27/2012   Pt is on a regimen of: - Metformin xr (Glumetza) 500 mg daily in am - started 3 mo ago. She could not tolerate well Regular Metformin in the past due to  Diarrhea. She did not try to increase Glumetza. - Lantus 80 units qhs - not missing doses - pen - Novolog 42 units tid ac - not missing doses - pen - Invokana, previously 300 mg daily, however this was decreased to 100 mg daily due to urinary frequency  Pt checks her sugars once a day and they are: - am: 102-145 Has lows after dinner once a week. Lowest sugar was 60; she has hypoglycemia awareness at 60. Highest sugar was 205, in am.  Pt's meals are: - Breakfast: bowl of cereal honey bunches/honey nut cereals + milk (whole) - Lunch: chicken wings, fries - Dinner: meatloaf + mashed potatoes + gravy + green beans + slice of bread - Snacks: grapes, apples - 1 or 2 a day Drinks sweet tea - big cup/day  - mild CKD, last BUN/creatinine:  Lab Results  Component Value Date   BUN 12 12/02/2012   CREATININE 1.0 12/02/2012  Last ACR high (12/02/2012): 53.8, previously 64.6. She is on Olmesartan) - last set of lipids: Lab Results  Component Value Date   CHOL 304* 12/02/2012   HDL 52.40 12/02/2012   LDLDIRECT 214.4 12/02/2012   TRIG 182.0* 12/02/2012   CHOLHDL 6 12/02/2012  she is on  Atorvastatin 80. - last eye exam was in 12/20/2012 - Dr. Alan Hobbs. 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 Macular edema - per pt description. - no numbness and tingling in her feet. She is on aspirin 81 mg.  I reviewed her chart and she also has a history of HTN, HL.  Pt has FH of DM in mother, MGM,.   ROS: Constitutional: + weight gain,+ fatigue, no subjective hyperthermia/hypothermia, + nocturia Eyes: no blurry vision, no xerophthalmia ENT: no sore throat, no nodules palpated in throat, no dysphagia/odynophagia, no hoarseness Cardiovascular: no CP/SOB/palpitations/leg swelling Respiratory: no cough/SOB Gastrointestinal: no N/V/D/C Musculoskeletal: no muscle/joint aches Skin: no rashes Neurological: no tremors/numbness/tingling/dizziness Psychiatric: no depression/anxiety  Past Medical History  Diagnosis Date  . BENIGN NEOPLASM OF SKIN SITE UNSPECIFIED 09/08/2008  . CEREBROVASCULAR DISEASE 12/11/2008  . DIABETES MELLITUS, TYPE II 10/12/2006  . HYPERLIPIDEMIA 10/12/2006  . HYPERTENSION 10/12/2006  . PERIPHERAL NEUROPATHY 10/21/2006  . Endometriosis   . Fibroids    Past Surgical History  Procedure Laterality Date  . Abdominal hysterectomy     History   Social History  . Marital Status: Single    Spouse Name: N/A    Number of Children: 0   Occupational History  . Not on file.   Social History Main Topics  . Smoking status: Never Smoker   . Smokeless tobacco: Never Used  . Alcohol Use: No  . Drug Use: No   Social History Narrative  Regular exercise: no   Caffeine use: ocassionally   Current Outpatient Prescriptions on File Prior to Visit  Medication Sig Dispense Refill  . amLODipine (NORVASC) 10 MG tablet TAKE ONE TABLET BY MOUTH EVERY DAY  90 tablet  1  . aspirin 81 MG tablet Take 1 tablet (81 mg total) by mouth daily.  30 tablet    . atorvastatin (LIPITOR) 80 MG tablet Take 1 tablet (80 mg total) by mouth daily.  90 tablet  3  . Canagliflozin (INVOKANA) 100 MG TABS Take 1 tablet (100 mg total) by mouth daily.  90 tablet  4  . chlorthalidone  (HYGROTON) 25 MG tablet Take 25 mg by mouth daily.        . diphenoxylate-atropine (LOMOTIL) 2.5-0.025 MG per tablet Take 1 tablet by mouth 4 (four) times daily as needed.  30 tablet  2  . escitalopram (LEXAPRO) 20 MG tablet Take 20 mg by mouth daily.        Marland Kitchen gabapentin (NEURONTIN) 300 MG capsule Take 300 mg by mouth 3 (three) times daily.        . insulin aspart (NOVOLOG FLEXPEN) 100 UNIT/ML injection       . Insulin Glargine (LANTUS SOLOSTAR) 100 UNIT/ML SOPN INJECT 80 UNITS SUBCUTANEOUSLY EVERY DAY AT BEDTIME  30 pen  1  . Insulin Pen Needle (B-D UF III MINI PEN NEEDLES) 31G X 5 MM MISC USE ONE  4 TIMES DAILY WITH MEALS AND AT BEDTIME  100 each  12  . labetalol (NORMODYNE) 300 MG tablet Take 1 tablet (300 mg total) by mouth 2 (two) times daily.  180 tablet  4  . metFORMIN (GLUMETZA) 500 MG (MOD) 24 hr tablet Take 1 tablet (500 mg total) by mouth daily with breakfast.  90 tablet  5  . potassium chloride (KLOR-CON) 20 MEQ packet Take 20 mEq by mouth daily.        Marland Kitchen glucose blood (BAYER CONTOUR TEST) test strip 1 each by Other route daily. Use as instructed       . olmesartan (BENICAR) 40 MG tablet Take 40 mg by mouth daily.       No current facility-administered medications on file prior to visit.   No Known Allergies History reviewed. No pertinent family history.  PE: BP 132/100  Pulse 90  Temp(Src) 97.9 F (36.6 C) (Oral)  Resp 12  Ht 5\' 7"  (1.702 m)  Wt 240 lb (108.863 kg)  BMI 37.58 kg/m2  SpO2 99% Wt Readings from Last 3 Encounters:  12/24/12 240 lb (108.863 kg)  12/02/12 242 lb (109.77 kg)  09/01/12 238 lb (107.956 kg)   Constitutional: overweight, in NAD Eyes: PERRLA, EOMI, no exophthalmos ENT: moist mucous membranes, no thyromegaly, no cervical lymphadenopathy Cardiovascular: RRR, No MRG Respiratory: CTA B Gastrointestinal: abdomen soft, NT, ND, BS+ Musculoskeletal: left arm and leg paresis Skin: moist, warm, no rashes Neurological: no tremor with outstretched  hands, DTR normal in all 4  ASSESSMENT: 1. DM2, insulin-dependent, uncontrolled, with complications - ? PN - on Neurontin - cerebrovascular ds. 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 Patient with long-standing, recently more uncontrolled diabetes, on oral antidiabetic regimen, which became insufficient - We discussed about options for treatment, and I suggested to:  Please decrease Lantus to 60 units. Please decrease NovoLog to 38 units with every meal. Continue Invokana 100 mg in am for now. Please add another Metformin XR tablet (500 mg) with breakfast x 3 days. If you tolerate this  well, at another metformin tablet with dinner (1000 mg) x 4 days. If you tolerate this well, had another metformin tablets with breakfast (1000 mg). Continue with 1000 mg of metformin twice a day with breakfast and dinner. - discussed diet choices >> given specific examples of healthier det - admits to poor diet - refilled her strips and Metformin  - Strongly advised her to start checking her sugars at different times of the day - check 3-4 times a day, rotating checks - given sugar log and advised how to fill it and to bring it at next appt  - given foot care handout and explained the principles  - given instructions for hypoglycemia management "15-15 rule"  - advised for yearly eye exams, she is up to date  - refuses flu vaccine today - will refer to nutrition - Return to clinic in one month with sugar log

## 2012-12-24 NOTE — Patient Instructions (Addendum)
Please return in 1 month with your sugar log.  Please decrease Lantus to 60 units. Please decrease NovoLog to 38 units with every meal. Continue Invokana 100 mg in am for now. Please add another Metformin XR tablet (500 mg) with breakfast x 3 days. If you tolerate this well, at another metformin tablet with dinner (1000 mg) x 4 days. If you tolerate this well, had another metformin tablets with breakfast (1000 mg). Continue with 1000 mg of metformin twice a day with breakfast and dinner. You will be called with the nutrition appointment.  PATIENT INSTRUCTIONS FOR TYPE 2 DIABETES:  **Please join MyChart!** - see attached instructions about how to join   DIET AND EXERCISE Diet and exercise is an important part of diabetic treatment.  We recommended aerobic exercise in the form of brisk walking (working between 40-60% of maximal aerobic capacity, similar to brisk walking) for 150 minutes per week (such as 30 minutes five days per week) along with 3 times per week performing 'resistance' training (using various gauge rubber tubes with handles) 5-10 exercises involving the major muscle groups (upper body, lower body and core) performing 10-15 repetitions (or near fatigue) each exercise. Start at half the above goal but build slowly to reach the above goals. If limited by weight, joint pain, or disability, we recommend daily walking in a swimming pool with water up to waist to reduce pressure from joints while allow for adequate exercise.    BLOOD GLUCOSES Monitoring your blood glucoses is important for continued management of your diabetes. Please check your blood glucoses 2-4 times a day: fasting, before meals and at bedtime (you can rotate these measurements - e.g. one day check before the 3 meals, the next day check before 2 of the meals and before bedtime, etc.   HYPOGLYCEMIA (low blood sugar) Hypoglycemia is usually a reaction to not eating, exercising, or taking too much insulin/ other diabetes  drugs.  Symptoms include tremors, sweating, hunger, confusion, headache, etc. Treat IMMEDIATELY with 15 grams of Carbs:   4 glucose tablets    cup regular juice/soda   2 tablespoons raisins   4 teaspoons sugar   1 tablespoon honey Recheck blood glucose in 15 mins and repeat above if still symptomatic/blood glucose <100. Please contact our office at [phone] if you have questions about how to next handle your insulin.  RECOMMENDATIONS TO REDUCE YOUR RISK OF DIABETIC COMPLICATIONS: * Take your prescribed MEDICATION(S). * Follow a DIABETIC diet: Complex carbs, fiber rich foods, heart healthy fish twice weekly, (monounsaturated and polyunsaturated) fats * AVOID saturated/trans fats, high fat foods, >2,300 mg salt per day. * EXERCISE at least 5 times a week for 30 minutes or preferably daily.  * DO NOT SMOKE OR DRINK more than 1 drink a day. * Check your FEET every day. Do not wear tightfitting shoes. Contact us if you develop an ulcer * See your EYE doctor once a year or more if needed * Get a FLU shot once a year * Get a PNEUMONIA vaccine once before and once after age 64 years  GOALS:  * Your Hemoglobin A1c of <7%  * fasting sugars need to be <130 * after meals sugars need to be <180 (2h after you start eating) * Your Systolic BP should be 140 or lower  * Your Diastolic BP should be 80 or lower  * Your HDL (Good Cholesterol) should be 40 or higher  * Your LDL (Bad Cholesterol) should be 100 or lower  * Your  Triglycerides should be 150 or lower  * Your Urine microalbumin (kidney function) should be <30 * Your Body Mass Index should be 25 or lower   We will be glad to help you achieve these goals. Our telephone number is: [phone].

## 2012-12-26 ENCOUNTER — Other Ambulatory Visit: Payer: Self-pay | Admitting: Internal Medicine

## 2012-12-27 ENCOUNTER — Telehealth: Payer: Self-pay | Admitting: Internal Medicine

## 2012-12-27 MED ORDER — LOSARTAN POTASSIUM 100 MG PO TABS: 100.0000 mg | ORAL_TABLET | Freq: Every day | ORAL | Status: DC

## 2012-12-27 NOTE — Telephone Encounter (Signed)
Pt stated Benicar is hurting her stomach. Please advise   [facility] [address]

## 2012-12-27 NOTE — Telephone Encounter (Signed)
Spoke to pt told her to stop Benicar and new Rx for Losartan 100 mg one tablet daily was sent to pharmacy. Pt verbalized understanding.

## 2012-12-27 NOTE — Telephone Encounter (Signed)
Please advise 

## 2012-12-27 NOTE — Telephone Encounter (Signed)
Switch to losartan 100 mg and discontinue Benicar

## 2012-12-31 ENCOUNTER — Other Ambulatory Visit: Payer: Self-pay | Admitting: *Deleted

## 2012-12-31 MED ORDER — GLUCOSE BLOOD VI STRP: ORAL_STRIP | Status: DC

## 2013-01-03 ENCOUNTER — Other Ambulatory Visit: Payer: Self-pay | Admitting: *Deleted

## 2013-01-03 ENCOUNTER — Telehealth: Payer: Self-pay | Admitting: Internal Medicine

## 2013-01-03 ENCOUNTER — Telehealth: Payer: Self-pay | Admitting: *Deleted

## 2013-01-03 MED ORDER — GLUCOSE BLOOD VI STRP: ORAL_STRIP | Status: DC

## 2013-01-03 NOTE — Telephone Encounter (Signed)
Please call regarding test strips, dx code on script will not go through / Sherri S.

## 2013-01-03 NOTE — Telephone Encounter (Signed)
Had to resend rx refill for test strips, with the right dx code on it.

## 2013-01-28 ENCOUNTER — Other Ambulatory Visit: Payer: Self-pay | Admitting: *Deleted

## 2013-01-28 ENCOUNTER — Ambulatory Visit (INDEPENDENT_AMBULATORY_CARE_PROVIDER_SITE_OTHER): Payer: Medicare Other | Admitting: Internal Medicine

## 2013-01-28 ENCOUNTER — Encounter: Payer: Self-pay | Admitting: Internal Medicine

## 2013-01-28 VITALS — BP 144/74 | HR 89 | Temp 98.4°F | Resp 10 | Wt 233.0 lb

## 2013-01-28 DIAGNOSIS — E1149 Type 2 diabetes mellitus with other diabetic neurological complication: Secondary | ICD-10-CM

## 2013-01-28 DIAGNOSIS — E1142 Type 2 diabetes mellitus with diabetic polyneuropathy: Secondary | ICD-10-CM | POA: Diagnosis not present

## 2013-01-28 MED ORDER — FREESTYLE LANCETS MISC: Status: DC

## 2013-01-28 NOTE — Patient Instructions (Addendum)
Please make the following changes: - continue Metformin XR (Glumetza) 1000 mg 2x a day - continue Lantus 60 units at night - take Novolog 42 units in am and 34 units before dinner. - continue Invokana 100 mg daily Keep up the great work! Please come back for a follow-up appointment in 3 months - bring your sugar log.

## 2013-01-28 NOTE — Progress Notes (Signed)
Patient ID: Lynn Hobbs, female   DOB: 16-Mar-1960, 52 y.o.   MRN: [id number]  HPI: Lynn Hobbs is a 52 y.o.-year-old female, returning for f/u of DM2, dx 2004, insulin-dependent, uncontrolled, with complications (?peripheral neuropathy, cerebrovascular ds, DR). She is here with her mother, who is also diabetic, and offers part of the history.  Last hemoglobin A1c was: Lab Results  Component Value Date   HGBA1C 10.7* 12/02/2012   HGBA1C 8.1* 09/01/2012   HGBA1C 10.7* 01/27/2012   Pt is on a regimen of: - Metformin XR (Glumetza) 1000 mg bid - Lantus 60 << 80 units qhs - not missing doses - pen - Novolog 38 << 42 units tid ac - not missing doses - pen - Invokana 100 mg daily (not 300 mg due to urinary frequency)  Pt checks her sugars 2-3x a day and they are improved: - am: 102-145 >> 64-146 (one 160) - 2h after b'fast: 91-200 (130-150) - before lunch: 43-140 - before dinner: 139, 140, 229 ( 229 x 1, after a snack) - 2h after dinner: 96, 108 - bedtime: 102, 113, last night 47 >> did not take Lantus last night - nighttime: 54 x 1 Lowest sugar was 43; she has hypoglycemia awareness at 60. Highest sugar was 229 after a snack.  Pt improved her diet since last visit >> lost 7 lbs! (cut down fast foods)  - mild CKD, last BUN/creatinine:  Lab Results  Component Value Date   BUN 12 12/02/2012   CREATININE 1.0 12/02/2012  Last ACR high (12/02/2012): 53.8, previously 64.6. She is on Losartan now (changed from [place]). - last set of lipids: Lab Results  Component Value Date   CHOL 304* 12/02/2012   HDL 52.40 12/02/2012   LDLDIRECT 214.4 12/02/2012   TRIG 182.0* 12/02/2012   CHOLHDL 6 12/02/2012  she is on  Atorvastatin 80. - last eye exam was in 12/20/2012 - Dr. Alan Mulder. 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  - no numbness and tingling in her feet. She is on aspirin 81 mg.  She has a history of HTN, HL.   ROS: Constitutional:  + weight loss,no fatigue, no subjective hyperthermia/hypothermia, + nocturia Eyes: no blurry vision, no xerophthalmia ENT: no sore throat, no nodules palpated in throat, no dysphagia/odynophagia, no hoarseness Cardiovascular: no CP/SOB/palpitations/leg swelling Respiratory: no cough/SOB Gastrointestinal: no N/V/D/C Musculoskeletal: no muscle/joint aches Skin: no rashes  I reviewed pt's medications, allergies, PMH, social hx, family hx and no changes required, except as mentioned above.  PE: BP 144/74  Pulse 89  Temp(Src) 98.4 F (36.9 C) (Oral)  Resp 10  Wt 233 lb (105.688 kg)  SpO2 98% Wt Readings from Last 3 Encounters:  01/28/13 233 lb (105.688 kg)  12/24/12 240 lb (108.863 kg)  12/02/12 242 lb (109.77 kg)   Constitutional: overweight, in NAD Eyes: PERRLA, EOMI, no exophthalmos ENT: moist mucous membranes, no thyromegaly, no cervical lymphadenopathy Cardiovascular: RRR, No MRG Respiratory: CTA B Gastrointestinal: abdomen soft, NT, ND, BS+ Musculoskeletal: left arm and leg paresis Skin: moist, warm, no rashes Neurological: no tremor with outstretched hands, DTR normal in all 4  ASSESSMENT: 1. DM2, insulin-dependent, uncontrolled, with complications - ? PN - on Neurontin - cerebrovascular ds. 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 Patient with long-standing, recently more uncontrolled diabetes, but with improved control since we adjusted the regimen at last visit and she started to improve her diet -  I suggested to:  - continue Metformin XR (Glumetza) 1000 mg bid - continue Lantus 60 units qhs - take Novolog 42 units in am and 34 units before dinner. - continue Invokana 100 mg daily  - congratulated her for the change in diet and her weight loss - refilled her strips and Metformin  - continue checking her sugars at different times of the day - check 3 times a day, rotating checks - given new sugar logs - advised for yearly eye exams, she is up to date  -  refuses flu vaccine today - refilled lancets (FreeStyle) - will check HbA1c at next visit (not 3 mo yet since previous) - Return to clinic in 3 months with sugar log

## 2013-03-08 ENCOUNTER — Encounter: Payer: Self-pay | Admitting: Internal Medicine

## 2013-03-08 ENCOUNTER — Ambulatory Visit (INDEPENDENT_AMBULATORY_CARE_PROVIDER_SITE_OTHER): Payer: Medicare Other | Admitting: Internal Medicine

## 2013-03-08 VITALS — BP 140/80 | HR 83 | Temp 98.2°F | Resp 20 | Wt 230.0 lb

## 2013-03-08 DIAGNOSIS — I10 Essential (primary) hypertension: Secondary | ICD-10-CM

## 2013-03-08 DIAGNOSIS — I679 Cerebrovascular disease, unspecified: Secondary | ICD-10-CM

## 2013-03-08 DIAGNOSIS — E1149 Type 2 diabetes mellitus with other diabetic neurological complication: Secondary | ICD-10-CM

## 2013-03-08 DIAGNOSIS — G609 Hereditary and idiopathic neuropathy, unspecified: Secondary | ICD-10-CM | POA: Diagnosis not present

## 2013-03-08 DIAGNOSIS — E1142 Type 2 diabetes mellitus with diabetic polyneuropathy: Secondary | ICD-10-CM

## 2013-03-08 NOTE — Patient Instructions (Signed)
Limit your sodium (Salt) intake   Please check your hemoglobin A1c every 3 months  Return in 6 months for follow-up  

## 2013-03-08 NOTE — Progress Notes (Signed)
Pre-visit discussion using our clinic review tool. No additional management support is needed unless otherwise documented below in the visit note.  

## 2013-03-08 NOTE — Progress Notes (Signed)
Subjective:    Patient ID: Lynn Hobbs, female    DOB: 08/30/1960, 52 y.o.   MRN: [id number]  HPI 52 year old patient who is seen today for followup. She has cerebrovascular disease diabetes and hypertension. She has dyslipidemia. She is now followed by endocrinology. Presently he is on Lantus insulin 60 units at bedtime and NovoLog 42 units prior to breakfast and 38 units prior to lunch and dinner. Her weight is down modestly and she is eating better. Generally feels well    Wt Readings from Last 3 Encounters:  03/08/13 230 lb (104.327 kg)  01/28/13 233 lb (105.688 kg)  12/24/12 240 lb (108.863 kg)   Past Medical History  Diagnosis Date  . BENIGN NEOPLASM OF SKIN SITE UNSPECIFIED 09/08/2008  . CEREBROVASCULAR DISEASE 12/11/2008  . DIABETES MELLITUS, TYPE II 10/12/2006  . HYPERLIPIDEMIA 10/12/2006  . HYPERTENSION 10/12/2006  . PERIPHERAL NEUROPATHY 10/21/2006  . Endometriosis   . Fibroids     History   Social History  . Marital Status: Single    Spouse Name: N/A    Number of Children: N/A  . Years of Education: N/A   Occupational History  . Not on file.   Social History Main Topics  . Smoking status: Never Smoker   . Smokeless tobacco: Never Used  . Alcohol Use: No  . Drug Use: No  . Sexual Activity: Not on file   Other Topics Concern  . Not on file   Social History Narrative   Regular exercise: no   Caffeine use: ocassionally    Past Surgical History  Procedure Laterality Date  . Abdominal hysterectomy      No family history on file.  No Known Allergies  Current Outpatient Prescriptions on File Prior to Visit  Medication Sig Dispense Refill  . amLODipine (NORVASC) 10 MG tablet TAKE ONE TABLET BY MOUTH EVERY DAY  90 tablet  1  . aspirin 81 MG tablet Take 1 tablet (81 mg total) by mouth daily.  30 tablet    . atorvastatin (LIPITOR) 80 MG tablet Take 1 tablet (80 mg total) by mouth daily.  90 tablet  3  . Canagliflozin (INVOKANA) 100 MG TABS Take 1 tablet  (100 mg total) by mouth daily.  90 tablet  4  . chlorthalidone (HYGROTON) 25 MG tablet TAKE ONE TABLET BY MOUTH EVERY DAY  90 tablet  1  . diphenoxylate-atropine (LOMOTIL) 2.5-0.025 MG per tablet Take 1 tablet by mouth 4 (four) times daily as needed.  30 tablet  2  . escitalopram (LEXAPRO) 20 MG tablet Take 20 mg by mouth daily.        Marland Kitchen gabapentin (NEURONTIN) 300 MG capsule Take 300 mg by mouth 3 (three) times daily.        Marland Kitchen glucose blood (FREESTYLE LITE) test strip Use 4x a day. Dx code: 250.60  200 each  12  . Insulin Glargine (LANTUS SOLOSTAR) 100 UNIT/ML SOPN INJECT 60 UNITS SUBCUTANEOUSLY EVERY DAY AT BEDTIME  30 pen  1  . Insulin Pen Needle (B-D UF III MINI PEN NEEDLES) 31G X 5 MM MISC USE ONE  4 TIMES DAILY WITH MEALS AND AT BEDTIME  100 each  12  . labetalol (NORMODYNE) 300 MG tablet Take 1 tablet (300 mg total) by mouth 2 (two) times daily.  180 tablet  4  . Lancets (FREESTYLE) lancets Test blood sugar 4 (four) times daily as instructed  150 each  2  . losartan (COZAAR) 100 MG tablet Take 1 tablet (  100 mg total) by mouth daily.  90 tablet  1  . metFORMIN (GLUMETZA) 500 MG (MOD) 24 hr tablet Take 2 tablets (1,000 mg total) by mouth 2 (two) times daily with a meal.  120 tablet  11  . olmesartan (BENICAR) 40 MG tablet Take 40 mg by mouth daily.      . potassium chloride (KLOR-CON) 20 MEQ packet Take 20 mEq by mouth daily.         No current facility-administered medications on file prior to visit.    BP 140/80  Pulse 83  Temp(Src) 98.2 F (36.8 C) (Oral)  Resp 20  Wt 230 lb (104.327 kg)  SpO2 97%    Review of Systems  Constitutional: Negative.   HENT: Negative for congestion, dental problem, hearing loss, rhinorrhea, sinus pressure, sore throat and tinnitus.   Eyes: Negative for pain, discharge and visual disturbance.  Respiratory: Negative for cough and shortness of breath.   Cardiovascular: Negative for chest pain, palpitations and leg swelling.  Gastrointestinal: Negative  for nausea, vomiting, abdominal pain, diarrhea, constipation, blood in stool and abdominal distention.  Genitourinary: Negative for dysuria, urgency, frequency, hematuria, flank pain, vaginal bleeding, vaginal discharge, difficulty urinating, vaginal pain and pelvic pain.  Musculoskeletal: Positive for gait problem. Negative for arthralgias and joint swelling.  Skin: Negative for rash.  Neurological: Negative for dizziness, syncope, speech difficulty, weakness, numbness and headaches.  Hematological: Negative for adenopathy.  Psychiatric/Behavioral: Negative for behavioral problems, dysphoric mood and agitation. The patient is not nervous/anxious.        Objective:   Physical Exam  Constitutional: She is oriented to person, place, and time. She appears well-developed and well-nourished.  Repeat blood pressure 120/70  HENT:  Head: Normocephalic.  Right Ear: External ear normal.  Left Ear: External ear normal.  Mouth/Throat: Oropharynx is clear and moist.  Eyes: Conjunctivae and EOM are normal. Pupils are equal, round, and reactive to light.  Neck: Normal range of motion. Neck supple. No thyromegaly present.  Cardiovascular: Normal rate, regular rhythm, normal heart sounds and intact distal pulses.   Pulmonary/Chest: Effort normal and breath sounds normal.  Abdominal: Soft. Bowel sounds are normal. She exhibits no mass. There is no tenderness.  Musculoskeletal: Normal range of motion.  Lymphadenopathy:    She has no cervical adenopathy.  Neurological: She is alert and oriented to person, place, and time.  Skin: Skin is warm and dry. No rash noted.  Psychiatric: She has a normal mood and affect. Her behavior is normal.          Assessment & Plan:  Diabetes mellitus. We'll check a hemoglobin A1c Hypertension excellent control Dyslipidemia Cerebrovascular disease stable  Followup endocrinology in 2 months Recheck here 6 months

## 2013-03-23 ENCOUNTER — Inpatient Hospital Stay (HOSPITAL_COMMUNITY)
Admission: EM | Admit: 2013-03-23 | Discharge: 2013-03-27 | DRG: 247 | Disposition: A | Discharge status: Home / Self Care | Payer: Medicare Other | Attending: Cardiovascular Disease | Admitting: Cardiovascular Disease

## 2013-03-23 ENCOUNTER — Other Ambulatory Visit: Payer: Self-pay

## 2013-03-23 ENCOUNTER — Encounter (HOSPITAL_COMMUNITY): Payer: Self-pay | Admitting: Emergency Medicine

## 2013-03-23 DIAGNOSIS — E1149 Type 2 diabetes mellitus with other diabetic neurological complication: Secondary | ICD-10-CM | POA: Diagnosis present

## 2013-03-23 DIAGNOSIS — I214 Non-ST elevation (NSTEMI) myocardial infarction: Secondary | ICD-10-CM | POA: Diagnosis not present

## 2013-03-23 DIAGNOSIS — Z79899 Other long term (current) drug therapy: Secondary | ICD-10-CM

## 2013-03-23 DIAGNOSIS — I10 Essential (primary) hypertension: Secondary | ICD-10-CM | POA: Diagnosis not present

## 2013-03-23 DIAGNOSIS — G609 Hereditary and idiopathic neuropathy, unspecified: Secondary | ICD-10-CM

## 2013-03-23 DIAGNOSIS — D239 Other benign neoplasm of skin, unspecified: Secondary | ICD-10-CM

## 2013-03-23 DIAGNOSIS — E876 Hypokalemia: Secondary | ICD-10-CM | POA: Diagnosis not present

## 2013-03-23 DIAGNOSIS — I2510 Atherosclerotic heart disease of native coronary artery without angina pectoris: Secondary | ICD-10-CM | POA: Diagnosis present

## 2013-03-23 DIAGNOSIS — E785 Hyperlipidemia, unspecified: Secondary | ICD-10-CM | POA: Diagnosis present

## 2013-03-23 DIAGNOSIS — Z6832 Body mass index (BMI) 32.0-32.9, adult: Secondary | ICD-10-CM | POA: Diagnosis not present

## 2013-03-23 DIAGNOSIS — E1142 Type 2 diabetes mellitus with diabetic polyneuropathy: Secondary | ICD-10-CM | POA: Diagnosis present

## 2013-03-23 DIAGNOSIS — Z23 Encounter for immunization: Secondary | ICD-10-CM

## 2013-03-23 DIAGNOSIS — I429 Cardiomyopathy, unspecified: Secondary | ICD-10-CM | POA: Diagnosis not present

## 2013-03-23 DIAGNOSIS — E669 Obesity, unspecified: Secondary | ICD-10-CM | POA: Diagnosis present

## 2013-03-23 DIAGNOSIS — Z7982 Long term (current) use of aspirin: Secondary | ICD-10-CM | POA: Diagnosis not present

## 2013-03-23 DIAGNOSIS — I213 ST elevation (STEMI) myocardial infarction of unspecified site: Secondary | ICD-10-CM

## 2013-03-23 DIAGNOSIS — R079 Chest pain, unspecified: Secondary | ICD-10-CM | POA: Diagnosis not present

## 2013-03-23 DIAGNOSIS — Z955 Presence of coronary angioplasty implant and graft: Secondary | ICD-10-CM

## 2013-03-23 DIAGNOSIS — I249 Acute ischemic heart disease, unspecified: Secondary | ICD-10-CM | POA: Diagnosis present

## 2013-03-23 DIAGNOSIS — I2109 ST elevation (STEMI) myocardial infarction involving other coronary artery of anterior wall: Secondary | ICD-10-CM | POA: Diagnosis not present

## 2013-03-23 DIAGNOSIS — I679 Cerebrovascular disease, unspecified: Secondary | ICD-10-CM

## 2013-03-23 DIAGNOSIS — Z6836 Body mass index (BMI) 36.0-36.9, adult: Secondary | ICD-10-CM | POA: Diagnosis not present

## 2013-03-23 DIAGNOSIS — Z8673 Personal history of transient ischemic attack (TIA), and cerebral infarction without residual deficits: Secondary | ICD-10-CM | POA: Diagnosis not present

## 2013-03-23 DIAGNOSIS — I517 Cardiomegaly: Secondary | ICD-10-CM | POA: Diagnosis not present

## 2013-03-23 DIAGNOSIS — Z794 Long term (current) use of insulin: Secondary | ICD-10-CM | POA: Diagnosis not present

## 2013-03-23 DIAGNOSIS — I200 Unstable angina: Secondary | ICD-10-CM | POA: Diagnosis not present

## 2013-03-23 LAB — CBC WITH DIFFERENTIAL/PLATELET
BASOS ABS: 0 10*3/uL (ref 0.0–0.1)
Basophils Relative: 0 % (ref 0–1)
Eosinophils Absolute: 0.1 10*3/uL (ref 0.0–0.7)
Eosinophils Relative: 1 % (ref 0–5)
HEMATOCRIT: 35.7 % — AB (ref 36.0–46.0)
Hemoglobin: 11.8 g/dL — ABNORMAL LOW (ref 12.0–15.0)
LYMPHS ABS: 2.5 10*3/uL (ref 0.7–4.0)
LYMPHS PCT: 23 % (ref 12–46)
MCH: 25.2 pg — ABNORMAL LOW (ref 26.0–34.0)
MCHC: 33.1 g/dL (ref 30.0–36.0)
MCV: 76.3 fL — ABNORMAL LOW (ref 78.0–100.0)
MONO ABS: 0.9 10*3/uL (ref 0.1–1.0)
Monocytes Relative: 8 % (ref 3–12)
Neutro Abs: 7.3 10*3/uL (ref 1.7–7.7)
Neutrophils Relative %: 68 % (ref 43–77)
Platelets: 228 10*3/uL (ref 150–400)
RBC: 4.68 MIL/uL (ref 3.87–5.11)
RDW: 16 % — AB (ref 11.5–15.5)
WBC: 10.8 10*3/uL — ABNORMAL HIGH (ref 4.0–10.5)

## 2013-03-23 LAB — COMPREHENSIVE METABOLIC PANEL
ALT: 13 U/L (ref 0–35)
AST: 18 U/L (ref 0–37)
Albumin: 4.3 g/dL (ref 3.5–5.2)
Alkaline Phosphatase: 61 U/L (ref 39–117)
BUN: 27 mg/dL — ABNORMAL HIGH (ref 6–23)
CHLORIDE: 93 meq/L — AB (ref 96–112)
CO2: 31 meq/L (ref 19–32)
CREATININE: 1.31 mg/dL — AB (ref 0.50–1.10)
Calcium: 9.9 mg/dL (ref 8.4–10.5)
GFR calc Af Amer: 53 mL/min — ABNORMAL LOW (ref >90)
GFR calc non Af Amer: 46 mL/min — ABNORMAL LOW (ref >90)
GLUCOSE: 231 mg/dL — AB (ref 70–99)
Potassium: 3.5 mEq/L — ABNORMAL LOW (ref 3.7–5.3)
Sodium: 138 mEq/L (ref 137–147)
Total Bilirubin: <0.2 mg/dL — ABNORMAL LOW (ref 0.3–1.2)
Total Protein: 8.7 g/dL — ABNORMAL HIGH (ref 6.0–8.3)

## 2013-03-23 LAB — TROPONIN I: Troponin I: <0.3 ng/mL (ref <0.30)

## 2013-03-23 NOTE — ED Notes (Signed)
Reports chest pain started about 1930 tonight, took two arthritis meds which did not help. Then was unable to get comfortable.  Feels like chest was fluttering for a bit.  Pain worsens with moving around.

## 2013-03-23 NOTE — ED Notes (Signed)
The pt has had mid-chest pain since 1930.  No dizziness no sob no nausea.  No hx of cardiac problems.  Feeling tired.  Some lt arm pain with this chest pain

## 2013-03-24 ENCOUNTER — Encounter (HOSPITAL_COMMUNITY)
Admission: EM | Disposition: A | Discharge status: Home / Self Care | Payer: Medicare Other | Source: Home / Self Care | Attending: Cardiovascular Disease

## 2013-03-24 ENCOUNTER — Emergency Department (HOSPITAL_COMMUNITY): Payer: Medicare Other

## 2013-03-24 DIAGNOSIS — I2109 ST elevation (STEMI) myocardial infarction involving other coronary artery of anterior wall: Principal | ICD-10-CM

## 2013-03-24 DIAGNOSIS — I2510 Atherosclerotic heart disease of native coronary artery without angina pectoris: Secondary | ICD-10-CM

## 2013-03-24 DIAGNOSIS — R079 Chest pain, unspecified: Secondary | ICD-10-CM

## 2013-03-24 DIAGNOSIS — I200 Unstable angina: Secondary | ICD-10-CM

## 2013-03-24 DIAGNOSIS — I249 Acute ischemic heart disease, unspecified: Secondary | ICD-10-CM | POA: Diagnosis present

## 2013-03-24 HISTORY — PX: LEFT HEART CATHETERIZATION WITH CORONARY ANGIOGRAM: SHX5451

## 2013-03-24 LAB — D-DIMER, QUANTITATIVE (NOT AT ARMC): D-Dimer, Quant: <0.27 ug/mL-FEU (ref 0.00–0.48)

## 2013-03-24 LAB — CBC
HCT: 32.2 % — ABNORMAL LOW (ref 36.0–46.0)
Hemoglobin: 10.8 g/dL — ABNORMAL LOW (ref 12.0–15.0)
MCH: 24.9 pg — ABNORMAL LOW (ref 26.0–34.0)
MCHC: 33.5 g/dL (ref 30.0–36.0)
MCV: 74.2 fL — AB (ref 78.0–100.0)
Platelets: 214 10*3/uL (ref 150–400)
RBC: 4.34 MIL/uL (ref 3.87–5.11)
RDW: 15.8 % — AB (ref 11.5–15.5)
WBC: 9.9 10*3/uL (ref 4.0–10.5)

## 2013-03-24 LAB — BASIC METABOLIC PANEL
BUN: 25 mg/dL — ABNORMAL HIGH (ref 6–23)
CALCIUM: 9.7 mg/dL (ref 8.4–10.5)
CO2: 29 mEq/L (ref 19–32)
CREATININE: 1.11 mg/dL — AB (ref 0.50–1.10)
Chloride: 92 mEq/L — ABNORMAL LOW (ref 96–112)
GFR calc Af Amer: 65 mL/min — ABNORMAL LOW (ref >90)
GFR, EST NON AFRICAN AMERICAN: 56 mL/min — AB (ref >90)
Glucose, Bld: 231 mg/dL — ABNORMAL HIGH (ref 70–99)
Potassium: 3.2 mEq/L — ABNORMAL LOW (ref 3.7–5.3)
SODIUM: 137 meq/L (ref 137–147)

## 2013-03-24 LAB — TROPONIN I
TROPONIN I: 0.73 ng/mL — AB (ref <0.30)
TROPONIN I: 4.86 ng/mL — AB (ref <0.30)
Troponin I: >20 ng/mL (ref <0.30)

## 2013-03-24 LAB — GLUCOSE, CAPILLARY
Glucose-Capillary: 175 mg/dL — ABNORMAL HIGH (ref 70–99)
Glucose-Capillary: 179 mg/dL — ABNORMAL HIGH (ref 70–99)
Glucose-Capillary: 215 mg/dL — ABNORMAL HIGH (ref 70–99)

## 2013-03-24 LAB — MAGNESIUM: Magnesium: 2.6 mg/dL — ABNORMAL HIGH (ref 1.5–2.5)

## 2013-03-24 LAB — POCT ACTIVATED CLOTTING TIME: ACTIVATED CLOTTING TIME: 387 s

## 2013-03-24 LAB — PROTIME-INR
INR: 1.09 (ref 0.00–1.49)
PROTHROMBIN TIME: 13.9 s (ref 11.6–15.2)

## 2013-03-24 LAB — MRSA PCR SCREENING: MRSA BY PCR: NEGATIVE

## 2013-03-24 SURGERY — LEFT HEART CATHETERIZATION WITH CORONARY ANGIOGRAM
Anesthesia: LOCAL

## 2013-03-24 MED ORDER — SODIUM CHLORIDE 0.9 % IV SOLN
INTRAVENOUS | Status: AC
  Administered 2013-03-24: 06:00:00 via INTRAVENOUS

## 2013-03-24 MED ORDER — ASPIRIN EC 81 MG PO TBEC
81.0000 mg | DELAYED_RELEASE_TABLET | Freq: Every day | ORAL | Status: DC
  Administered 2013-03-26 – 2013-03-27 (×2): 81 mg via ORAL
  Filled 2013-03-24 (×4): qty 1

## 2013-03-24 MED ORDER — NITROGLYCERIN 0.2 MG/ML ON CALL CATH LAB
INTRAVENOUS | Status: AC
  Filled 2013-03-24: qty 1

## 2013-03-24 MED ORDER — ACETAMINOPHEN 325 MG PO TABS: 650.0000 mg | ORAL_TABLET | ORAL | Status: DC | PRN

## 2013-03-24 MED ORDER — HEPARIN BOLUS VIA INFUSION
4000.0000 [IU] | Freq: Once | INTRAVENOUS | Status: AC
  Administered 2013-03-24: 4000 [IU] via INTRAVENOUS
  Filled 2013-03-24: qty 4000

## 2013-03-24 MED ORDER — AMLODIPINE BESYLATE 10 MG PO TABS
10.0000 mg | ORAL_TABLET | Freq: Every day | ORAL | Status: DC
  Administered 2013-03-25 – 2013-03-27 (×3): 10 mg via ORAL
  Filled 2013-03-24 (×5): qty 1

## 2013-03-24 MED ORDER — HEPARIN SODIUM (PORCINE) 5000 UNIT/ML IJ SOLN
5000.0000 [IU] | Freq: Three times a day (TID) | INTRAMUSCULAR | Status: DC
  Filled 2013-03-24 (×4): qty 1

## 2013-03-24 MED ORDER — NITROGLYCERIN IN D5W 200-5 MCG/ML-% IV SOLN
INTRAVENOUS | Status: AC
  Filled 2013-03-24: qty 250

## 2013-03-24 MED ORDER — ONDANSETRON HCL 4 MG/2ML IJ SOLN: 4.0000 mg | Freq: Four times a day (QID) | INTRAMUSCULAR | Status: DC | PRN

## 2013-03-24 MED ORDER — HEPARIN (PORCINE) IN NACL 2-0.9 UNIT/ML-% IJ SOLN
INTRAMUSCULAR | Status: AC
  Filled 2013-03-24: qty 1000

## 2013-03-24 MED ORDER — ASPIRIN 81 MG PO CHEW
324.0000 mg | CHEWABLE_TABLET | Freq: Once | ORAL | Status: AC
  Administered 2013-03-24: 324 mg via ORAL
  Filled 2013-03-24: qty 4

## 2013-03-24 MED ORDER — HEPARIN (PORCINE) IN NACL 100-0.45 UNIT/ML-% IJ SOLN
1200.0000 [IU]/h | INTRAMUSCULAR | Status: DC
  Administered 2013-03-24: 1200 [IU]/h via INTRAVENOUS
  Filled 2013-03-24: qty 250

## 2013-03-24 MED ORDER — NITROGLYCERIN IN D5W 200-5 MCG/ML-% IV SOLN: 2.0000 ug/min | INTRAVENOUS | Status: DC

## 2013-03-24 MED ORDER — CLOPIDOGREL BISULFATE 300 MG PO TABS
ORAL_TABLET | ORAL | Status: AC
  Filled 2013-03-24: qty 1

## 2013-03-24 MED ORDER — FENTANYL CITRATE 0.05 MG/ML IJ SOLN
INTRAMUSCULAR | Status: AC
  Filled 2013-03-24: qty 2

## 2013-03-24 MED ORDER — ASPIRIN EC 81 MG PO TBEC
81.0000 mg | DELAYED_RELEASE_TABLET | Freq: Every day | ORAL | Status: DC
  Filled 2013-03-24 (×2): qty 1

## 2013-03-24 MED ORDER — INSULIN ASPART 100 UNIT/ML ~~LOC~~ SOLN
0.0000 [IU] | Freq: Every day | SUBCUTANEOUS | Status: DC
  Administered 2013-03-24 – 2013-03-25 (×2): 2 [IU] via SUBCUTANEOUS

## 2013-03-24 MED ORDER — INSULIN GLARGINE 100 UNIT/ML ~~LOC~~ SOLN
60.0000 [IU] | Freq: Every day | SUBCUTANEOUS | Status: DC
  Administered 2013-03-24 – 2013-03-26 (×3): 60 [IU] via SUBCUTANEOUS
  Filled 2013-03-24 (×4): qty 0.6

## 2013-03-24 MED ORDER — LOSARTAN POTASSIUM 50 MG PO TABS
100.0000 mg | ORAL_TABLET | Freq: Every day | ORAL | Status: DC
  Administered 2013-03-25 – 2013-03-27 (×3): 100 mg via ORAL
  Filled 2013-03-24 (×5): qty 2

## 2013-03-24 MED ORDER — LIDOCAINE HCL (PF) 1 % IJ SOLN
INTRAMUSCULAR | Status: AC
  Filled 2013-03-24: qty 30

## 2013-03-24 MED ORDER — ATORVASTATIN CALCIUM 80 MG PO TABS
80.0000 mg | ORAL_TABLET | Freq: Every day | ORAL | Status: DC
  Administered 2013-03-24 – 2013-03-26 (×3): 80 mg via ORAL
  Filled 2013-03-24 (×6): qty 1

## 2013-03-24 MED ORDER — SODIUM CHLORIDE 0.9 % IV SOLN
INTRAVENOUS | Status: DC
  Administered 2013-03-24: 125 mL/h via INTRAVENOUS

## 2013-03-24 MED ORDER — METOPROLOL TARTRATE 25 MG PO TABS
25.0000 mg | ORAL_TABLET | Freq: Two times a day (BID) | ORAL | Status: DC
  Administered 2013-03-24 – 2013-03-25 (×3): 25 mg via ORAL
  Filled 2013-03-24 (×8): qty 1

## 2013-03-24 MED ORDER — NITROGLYCERIN 2 % TD OINT
0.5000 [in_us] | TOPICAL_OINTMENT | Freq: Once | TRANSDERMAL | Status: AC
  Administered 2013-03-24: 0.5 [in_us] via TOPICAL
  Filled 2013-03-24: qty 1

## 2013-03-24 MED ORDER — GABAPENTIN 300 MG PO CAPS
300.0000 mg | ORAL_CAPSULE | Freq: Three times a day (TID) | ORAL | Status: DC | PRN
Start: 2013-03-24 — End: 2013-03-27
  Filled 2013-03-24: qty 1

## 2013-03-24 MED ORDER — ASPIRIN EC 81 MG PO TBEC
81.0000 mg | DELAYED_RELEASE_TABLET | Freq: Every day | ORAL | Status: DC
  Administered 2013-03-25: 81 mg via ORAL
  Filled 2013-03-24 (×2): qty 1

## 2013-03-24 MED ORDER — MORPHINE SULFATE 4 MG/ML IJ SOLN
4.0000 mg | Freq: Once | INTRAMUSCULAR | Status: AC
  Administered 2013-03-24: 4 mg via INTRAVENOUS
  Filled 2013-03-24: qty 1

## 2013-03-24 MED ORDER — NITROGLYCERIN 0.4 MG SL SUBL: 0.4000 mg | SUBLINGUAL_TABLET | SUBLINGUAL | Status: DC | PRN

## 2013-03-24 MED ORDER — CLOPIDOGREL BISULFATE 75 MG PO TABS
75.0000 mg | ORAL_TABLET | Freq: Every day | ORAL | Status: DC
  Administered 2013-03-25 – 2013-03-27 (×3): 75 mg via ORAL
  Filled 2013-03-24 (×4): qty 1

## 2013-03-24 MED ORDER — INSULIN ASPART 100 UNIT/ML ~~LOC~~ SOLN
0.0000 [IU] | Freq: Three times a day (TID) | SUBCUTANEOUS | Status: DC
  Administered 2013-03-24: 2 [IU] via SUBCUTANEOUS
  Administered 2013-03-25: 1 [IU] via SUBCUTANEOUS
  Administered 2013-03-25: 2 [IU] via SUBCUTANEOUS
  Administered 2013-03-26: 1 [IU] via SUBCUTANEOUS

## 2013-03-24 MED ORDER — SODIUM CHLORIDE 0.9 % IV BOLUS (SEPSIS)
500.0000 mL | Freq: Once | INTRAVENOUS | Status: AC
  Administered 2013-03-24: 500 mL via INTRAVENOUS

## 2013-03-24 MED ORDER — MIDAZOLAM HCL 2 MG/2ML IJ SOLN
INTRAMUSCULAR | Status: AC
  Filled 2013-03-24: qty 2

## 2013-03-24 MED ORDER — BIVALIRUDIN 250 MG IV SOLR
INTRAVENOUS | Status: AC
  Filled 2013-03-24: qty 250

## 2013-03-24 NOTE — ED Notes (Signed)
Report to [employer] on [place].  Pt to go to floor with RN accompanying and on cardiac monitor.

## 2013-03-24 NOTE — Progress Notes (Signed)
Fritz Pickerel Sprinkle here to pull right femoral sheath.   Sheath pulled at 1615 and manual pressure held for 20 minutes; pt's VS remained stable during procedure (see VS).   Achieved homeostasis at 1635; groin level 0 and pressure dressing placed over site.   Loni Muse, RN

## 2013-03-24 NOTE — Interval H&P Note (Signed)
Cath Lab Visit (complete for each Cath Lab visit)  Clinical Evaluation Leading to the Procedure:   ACS: yes  Non-ACS:    Anginal Classification: CCS IV  Anti-ischemic medical therapy: Maximal Therapy (2 or more classes of medications)  Non-Invasive Test Results: No non-invasive testing performed  Prior CABG: No previous CABG      History and Physical Interval Note:  03/24/2013 8:36 AM  Lynn Hobbs  has presented today for surgery, with the diagnosis of cp  The various methods of treatment have been discussed with the patient and family. After consideration of risks, benefits and other options for treatment, the patient has consented to  Procedure(s): LEFT HEART CATHETERIZATION WITH CORONARY ANGIOGRAM (N/A) as a surgical intervention .  The patient's history has been reviewed, patient examined, no change in status, stable for surgery.  I have reviewed the patient's chart and labs.  Questions were answered to the patient's satisfaction.     Myra Weng A

## 2013-03-24 NOTE — Progress Notes (Signed)
ANTICOAGULATION CONSULT NOTE - Initial Consult  Pharmacy Consult for Heparin Indication: chest pain/ACS  No Known Allergies  Patient Measurements: Height: 5' 6.93" (170 cm) Weight: 229 lb 4.5 oz (104 kg) IBW/kg (Calculated) : 61.44 Heparin Dosing Weight: 85 kg  Vital Signs: Temp: 97.9 F (36.6 C) (01/14 2145) Temp src: Oral (01/14 2145) BP: 134/70 mmHg (01/15 0515) Pulse Rate: 100 (01/15 0515)  Labs:  Recent Labs  03/23/13 2150 03/24/13 0325  HGB 11.8*  --   HCT 35.7*  --   PLT 228  --   CREATININE 1.31*  --   TROPONINI <0.30 0.73*    Estimated Creatinine Clearance: 62.2 ml/min (by C-G formula based on Cr of 1.31).   Medical History: Past Medical History  Diagnosis Date  . BENIGN NEOPLASM OF SKIN SITE UNSPECIFIED 09/08/2008  . CEREBROVASCULAR DISEASE 12/11/2008  . DIABETES MELLITUS, TYPE II 10/12/2006  . HYPERLIPIDEMIA 10/12/2006  . HYPERTENSION 10/12/2006  . PERIPHERAL NEUROPATHY 10/21/2006  . Endometriosis   . Fibroids     Medications:  Norvasc  ASA  Lipitor  Invokana  Chlorthalidone  Neurontin  Novolog  Lantus  Labetalol  Cozaar  Metformin  KCl  Assessment: 53 yo female with chest pain for heparin  Goal of Therapy:  Heparin level 0.3-0.7 units/ml Monitor platelets by anticoagulation protocol: Yes   Plan:  Heparin 4000 units IV bolus, then 1200 units/hr Check heparin level in 6 hours.  Caryl Pina 03/24/2013,5:33 AM

## 2013-03-24 NOTE — H&P (Signed)
Cardiology History and Physical  Nyoka Cowden, MD  History of Present Illness (and review of medical records): Lynn Hobbs is a 53 y.o. female who presents for evaluation of chest pain.  She has multiple cardiac risk factors, but no prior hx of MI or known CAD.  She has had prior CVA x 2.  She presents with chest pain that began today around 7pm while at rest watching TV.  Pain was midsternal and radiated to her left arm and associated with numbness.  Pain was 6/10.  She denied any shortness of breath, diaphoresis, palpitations, PND, orthopnea or syncope.  She presented to ED after taking ASA at home.  In ED she was given nitro which helped bring pain to 3/10.  Her initial ecg revealed sinus tach and troponin was negative.  Previous diagnostic testing for coronary artery disease includes: echocardiogram. Previous history of cardiac disease includes None. Coronary artery disease risk factors include: diabetes mellitus, dyslipidemia, hypertension, microalbuminuria, obesity (BMI >= 30 kg/m2) and sedentary lifestyle. Patient denies history of angina, CHF, coronary artery disease, previous M.I. and valvular disease.  Review of Systems No recent viral illnesses. Further review of systems was otherwise negative other than stated in HPI.  Patient Active Problem List   Diagnosis Date Noted  . Diabetes with neurologic complications 17/49/4496  . CEREBROVASCULAR DISEASE 12/11/2008  . BENIGN NEOPLASM OF SKIN SITE UNSPECIFIED 09/08/2008  . PERIPHERAL NEUROPATHY 10/21/2006  . HYPERLIPIDEMIA 10/12/2006  . Essential hypertension 10/12/2006   Past Medical History  Diagnosis Date  . BENIGN NEOPLASM OF SKIN SITE UNSPECIFIED 09/08/2008  . CEREBROVASCULAR DISEASE 12/11/2008  . DIABETES MELLITUS, TYPE II 10/12/2006  . HYPERLIPIDEMIA 10/12/2006  . HYPERTENSION 10/12/2006  . PERIPHERAL NEUROPATHY 10/21/2006  . Endometriosis   . Fibroids     Past Surgical History  Procedure Laterality Date  .  Abdominal hysterectomy       (Not in a hospital admission) No Known Allergies  History  Substance Use Topics  . Smoking status: Never Smoker   . Smokeless tobacco: Never Used  . Alcohol Use: No    No family history on file.   Objective:  Patient Vitals for the past 8 hrs:  BP Temp Temp src Pulse Resp SpO2  03/24/13 0330 138/76 mmHg - - 102 14 100 %  03/24/13 0245 124/72 mmHg - - 110 13 99 %  03/24/13 0215 131/64 mmHg - - 103 14 97 %  03/24/13 0200 137/69 mmHg - - 102 14 99 %  03/24/13 0130 152/82 mmHg - - 101 16 100 %  03/24/13 0100 143/73 mmHg - - 91 14 100 %  03/24/13 0015 149/80 mmHg - - 89 14 100 %  03/23/13 2345 136/69 mmHg - - 89 14 100 %  03/23/13 2330 126/74 mmHg - - 94 16 100 %  03/23/13 2145 171/82 mmHg 97.9 F (36.6 C) Oral 104 18 100 %   General appearance: alert, cooperative, appears stated age and no distress Head: Normocephalic, without obvious abnormality, atraumatic Eyes: conjunctivae/corneas clear. PERRL, EOM's intact. Fundi benign. Neck: no carotid bruit, no JVD and supple, symmetrical, trachea midline Lungs: clear to auscultation bilaterally Chest wall: no tenderness Heart: regular rate and rhythm, S1, S2 normal, no murmur, click, rub or gallop Abdomen: soft, non-tender; bowel sounds normal; no masses,  no organomegaly Extremities: extremities normal, atraumatic, no cyanosis or edema Pulses: 2+ and symmetric Neurologic: oriented x 3  Results for orders placed during the hospital encounter of 03/23/13 (from the past 48 hour(s))  CBC WITH DIFFERENTIAL     Status: Abnormal   Collection Time    03/23/13  9:50 PM      Result Value Range   WBC 10.8 (*) 4.0 - 10.5 K/uL   RBC 4.68  3.87 - 5.11 MIL/uL   Hemoglobin 11.8 (*) 12.0 - 15.0 g/dL   HCT 35.7 (*) 36.0 - 46.0 %   MCV 76.3 (*) 78.0 - 100.0 fL   MCH 25.2 (*) 26.0 - 34.0 pg   MCHC 33.1  30.0 - 36.0 g/dL   RDW 16.0 (*) 11.5 - 15.5 %   Platelets 228  150 - 400 K/uL   Neutrophils Relative % 68  43 -  77 %   Neutro Abs 7.3  1.7 - 7.7 K/uL   Lymphocytes Relative 23  12 - 46 %   Lymphs Abs 2.5  0.7 - 4.0 K/uL   Monocytes Relative 8  3 - 12 %   Monocytes Absolute 0.9  0.1 - 1.0 K/uL   Eosinophils Relative 1  0 - 5 %   Eosinophils Absolute 0.1  0.0 - 0.7 K/uL   Basophils Relative 0  0 - 1 %   Basophils Absolute 0.0  0.0 - 0.1 K/uL  COMPREHENSIVE METABOLIC PANEL     Status: Abnormal   Collection Time    03/23/13  9:50 PM      Result Value Range   Sodium 138  137 - 147 mEq/L   Potassium 3.5 (*) 3.7 - 5.3 mEq/L   Chloride 93 (*) 96 - 112 mEq/L   CO2 31  19 - 32 mEq/L   Glucose, Bld 231 (*) 70 - 99 mg/dL   BUN 27 (*) 6 - 23 mg/dL   Creatinine, Ser 1.31 (*) 0.50 - 1.10 mg/dL   Calcium 9.9  8.4 - 10.5 mg/dL   Total Protein 8.7 (*) 6.0 - 8.3 g/dL   Albumin 4.3  3.5 - 5.2 g/dL   AST 18  0 - 37 U/L   ALT 13  0 - 35 U/L   Alkaline Phosphatase 61  39 - 117 U/L   Total Bilirubin <0.2 (*) 0.3 - 1.2 mg/dL   GFR calc non Af Amer 46 (*) >90 mL/min   GFR calc Af Amer 53 (*) >90 mL/min   Comment: (NOTE)     The eGFR has been calculated using the CKD EPI equation.     This calculation has not been validated in all clinical situations.     eGFR's persistently <90 mL/min signify possible Chronic Kidney     Disease.  TROPONIN I     Status: None   Collection Time    03/23/13  9:50 PM      Result Value Range   Troponin I <0.30  <0.30 ng/mL   Comment:            Due to the release kinetics of cTnI,     a negative result within the first hours     of the onset of symptoms does not rule out     myocardial infarction with certainty.     If myocardial infarction is still suspected,     repeat the test at appropriate intervals.  D-DIMER, QUANTITATIVE     Status: None   Collection Time    03/24/13 12:52 AM      Result Value Range   D-Dimer, Quant <0.27  0.00 - 0.48 ug/mL-FEU   Comment:  AT THE INHOUSE ESTABLISHED CUTOFF     VALUE OF 0.48 ug/mL FEU,     THIS ASSAY HAS BEEN DOCUMENTED      IN THE LITERATURE TO HAVE     A SENSITIVITY AND NEGATIVE     PREDICTIVE VALUE OF AT LEAST     98 TO 99%.  THE TEST RESULT     SHOULD BE CORRELATED WITH     AN ASSESSMENT OF THE CLINICAL     PROBABILITY OF DVT / VTE.   Dg Chest 2 View  03/24/2013   CLINICAL DATA:  Chest pain  EXAM: CHEST  2 VIEW  COMPARISON:  06/03/2005  FINDINGS: Normal heart size and mediastinal contours. No acute infiltrate or edema. No effusion or pneumothorax. No acute osseous findings.  IMPRESSION: No active cardiopulmonary disease.   Electronically Signed   By: Jorje Guild M.D.   On: 03/24/2013 01:03    ECG:  Sinus tachycardia HR 107, no acute ischemic changes. No significant change from prior ecgs in 2007 and 2005 other than increase in HR.  Assessment: 72F hx of CVA x 2, HTN, HLD, DM on insulin, no known hx of CAD presents with retrosternal chest pain with radiation to left arm with sinus tach on ecg and initial negative troponin and normal d-dimer. Suspect probable unstable angina with TIMI Risk 3.  Patient still having chest pain 3/10.  Plan:  1. Cardiology Admission  2. Continuous monitoring on Telemetry. 3. Repeat ekg on admit, prn chest pain or arrythmia 4. Cycle cardiac biomarkers 5. Medical management to include ASA, Heparin gtt, BB, Statin, NTG prn 6. Hold metformin, SSI 7. Gentle IVFs 8. Keep NPO for likely further ischemic evaluation with cardiac catheterization.

## 2013-03-24 NOTE — H&P (View-Only) (Signed)
Patient Name: Lynn Hobbs Date of Encounter: 03/24/2013   Principal Problem:   ACS (acute coronary syndrome) Active Problems:   Essential hypertension   Diabetes with neurologic complications   HYPERLIPIDEMIA   PERIPHERAL NEUROPATHY   SUBJECTIVE  Pt presented last night with chest pain - not worse with positioning or deep breathing.  She ruled in overnight - trop currently 4.86.  She is on heparin.  She initially had some c/p relief with NTP but not complete relief. ECG @ 3AM showed ant and inf ST elevation, which was new compared to admission.  She continues to have 3/10 chest pain.  Follow-up ECG this morning continues to show anterior ST elevation/coving.  She is hemodynamically stable and going to the cath lab urgently.  CURRENT MEDS . amLODipine  10 mg Oral Daily  . [START ON 03/25/2013] aspirin EC  81 mg Oral Daily  . aspirin EC  81 mg Oral Daily  . atorvastatin  80 mg Oral QHS  . insulin aspart  0-5 Units Subcutaneous QHS  . insulin aspart  0-9 Units Subcutaneous TID WC  . losartan  100 mg Oral Daily  . metoprolol tartrate  25 mg Oral BID    OBJECTIVE  Filed Vitals:   03/24/13 0547 03/24/13 0551 03/24/13 0600 03/24/13 0615  BP:   147/76 139/74  Pulse:   101 99  Temp:      TempSrc:      Resp:   14   Height: 5\' 6"  (1.676 m) 5\' 9"  (1.753 m)    Weight: 224 lb 10.4 oz (101.9 kg) 222 lb 14.2 oz (101.1 kg)    SpO2:   98% 100%   No intake or output data in the 24 hours ending 03/24/13 0810 Filed Weights   03/24/13 0515 03/24/13 0547 03/24/13 0551  Weight: 229 lb 4.5 oz (104 kg) 224 lb 10.4 oz (101.9 kg) 222 lb 14.2 oz (101.1 kg)    PHYSICAL EXAM  General: Pleasant, NAD. Neuro: Alert and oriented X 3. Moves all extremities spontaneously. Psych: Normal affect. HEENT:  Normal  Neck: Supple without bruits or JVD. Lungs:  Resp regular and unlabored, CTA. Heart: RRR no s3, s4, or murmurs. Abdomen: Soft, non-tender, non-distended, BS + x 4.  Extremities: No  clubbing, cyanosis or edema. DP/PT/Radials 2+ and equal bilaterally.  Accessory Clinical Findings  CBC  Recent Labs  03/23/13 2150 03/24/13 0642  WBC 10.8* 9.9  NEUTROABS 7.3  --   HGB 11.8* 10.8*  HCT 35.7* 32.2*  MCV 76.3* 74.2*  PLT 228 242   Basic Metabolic Panel  Recent Labs  03/23/13 2150 03/24/13 0642  NA 138 137  K 3.5* 3.2*  CL 93* 92*  CO2 31 29  GLUCOSE 231* 231*  BUN 27* 25*  CREATININE 1.31* 1.11*  CALCIUM 9.9 9.7  MG  --  2.6*   Liver Function Tests  Recent Labs  03/23/13 2150  AST 18  ALT 13  ALKPHOS 61  BILITOT <0.2*  PROT 8.7*  ALBUMIN 4.3   Cardiac Enzymes  Recent Labs  03/23/13 2150 03/24/13 0325 03/24/13 0635  TROPONINI <0.30 0.73* 4.86*   D-Dimer  Recent Labs  03/24/13 0052  DDIMER <0.27    ECG  Rsr, 98, an ST elevation, inf q's  Radiology/Studies  Dg Chest 2 View  03/24/2013   CLINICAL DATA:  Chest pain  EXAM: CHEST  2 VIEW  COMPARISON:  06/03/2005  FINDINGS: Normal heart size and mediastinal contours. No acute infiltrate or edema.  No effusion or pneumothorax. No acute osseous findings.  IMPRESSION: No active cardiopulmonary disease.   Electronically Signed   By: Jorje Guild M.D.   On: 03/24/2013 01:03    ASSESSMENT AND PLAN  1.  Acute Ant STEMI:  Pt presented last night with chest pain with some relief from nitrates but not complete relief.  She has cont to have 3/10 c/p overnight and has ruled in.  ECG has progressively worsened overnight and she cont to have anterior ST elevation this morning.  We have called the cath lab and she will be taken over for emergent cath.  She received 324 of asa this AM.  She is on heparin.  Cont bb, statin.  Eventual cardiac rehab post-cath.  2.  HTN:  Stable.  Cont bb/arb.  3.  HL:  Cont high potency statin.  LDL 214 in 11/2012.  LFT's wnl yesterday.  4.  DM:  Cont SSI.  Resume home dose of lantus.  Hold metformin.  5.  Hypokalemia:  supp.  Signed, Murray Hodgkins  NP   The patient was seen, examined and discussed with Carmela Rima, NP and agree as above.  53 year old female with h/o DM and 2 prior strokes (2007, 2009), no prior CAD history, who came the last night with typical retrosternal chest that was minimally relieved by NTG./ ECG on arrival normal, at 3 am shows anterior and inferior STEMI, currently anterior STEMI. Troponin raised from normal to 4.86 and is still upwarding. The patient has ongoing pain. We will called code STEMI. Continue Heparin, ASA, metoprolol, losartan, nitro paste, high dose atorvastatin.  Ena Dawley, Lemmie Evens 03/24/2013

## 2013-03-24 NOTE — Progress Notes (Signed)
Utilization review completed. Lucia Harm, RN, BSN. 

## 2013-03-24 NOTE — CV Procedure (Addendum)
Cliffie KIEU QUIGGLE is a 53 y.o. female    [id number]  [id number] LOCATION:  FACILITY: [facility]  PHYSICIAN: Troy Sine, MD, [facility] 08-12-1960   DATE OF PROCEDURE:  03/24/2013    CARDIAC CATHETERIZATION/PERCUTANEOUS CORONARY INTERVENTION     HISTORY: [name] is a 53 year old African American female who has a history of diabetes mellitus with neurologic complications and has sustained 2 prior CVAs, history of hyperlipidemia, and chin, and peripheral neuropathy. She apparently developed chest pain leading to her admission last evening proximally 7 PM. She continued to have chest pain all night. Her ECG at approximately 319 this morning did reveal anterior ST segment elevation and also development of inferior Q waves. She was seen on morning rounds Dr. Meda Coffee this morning and due to concerns of an ST segment elevation myocardial infarction with troponins positive at 4.86, urgent cardiac catheterization was recommended.   PROCEDURE:  The patient arrived to the cardiac catheterization laboratory with chest pain that has persisted all night.  Her right femoral artery was punctured anteriorly and a 6 French sheath was inserted without difficulty. Diagnostic catheterization was done utilizing 6 French Judkins 4 left and right coronary catheters. With the demonstration of a subtotally occluded LAD with TIMI 1 flow the decision was made to attempt intervention. Angiomax bolus plus infusion was administered. Due to the patient's stroke history including a hemorrhagic stroke she was given Plavix rather than Effient or Brilinta for antiplatelet therapy. ACT was therapeutic. A XB LAD 3.5 guide was used. A Prowater wire was advanced beyond the subtotal LAD stenosis to the distal LAD. The distal LAD was small and also had diffuse disease. A 2.5x12 mm Trek balloon was used for initial dilatation at the site of subtotal stenosis at 6 and 8 atmospheres. A 3.0x18 mm Xience Alpine stent was then inserted and  deployed x2 at 12 and 14 atmospheres. An Fontanet Euphora balloon was used for post-stent dilatation a 12 and 13. Scout angiography confirmed an excellent angiographic result. A 6 French pigtail catheter was used for RAO ventriculography.  The patient left the catheterization laboratory chest pain-free with his TIMI-3 flow and no evidence for dissection.   HEMODYNAMICS:   Central Aorta: 145/85   Left Ventricle: 145/18  ANGIOGRAPHY:  1. Left main: Normal and bifurcated into an LAD and left circumflex system. 2. LAD: Moderate size vessel that was 99% stenosed beyond the proximal septal and diagonal vessel. There was TIMI one half flow. The distal LAD was diffusely diseased with stenoses of 50-60%. 40 - 50% narrowing in the first diagonal branch of the LAD. 3. Left circumflex: Mild irregularities without significant obstructive disease it gave rise to your marginal vessels.  4. Right coronary artery: Severely diffusely diseased with shepherd's crook takeoff with 40% near ostial narrowing, diffuse 70-78% mid stenoses, 80% stenosis before the acute margin, 6% distal RCA stenosis. The vessel ended in the PDA and PLA vessel.   Following successful percutaneous cardiac intervention to the LAD with PTCA, and stenting with a rate 3.0x18 mm Xience Alpine DES stent postdilated to 3.25 mm, the 99% stenosis was reduced to 0%. There was brisk TIMI-3 flow. The distal LAD had diffuse disease of 50-60%.  IMPRESSION:  ACS/ST segment elevation anterior wall myocardial infarction secondary to subtotal LAD stenosis after the first diagonal branch with initial TIMI 1/2 flow; diffuse 50-60% distal LAD stenoses and 40 - 50% DX1 stenosis.  Normal circumflex coronary artery with mild luminal irregularities.  Diffusely diseased RCA artery with diffuse stenoses of  70-80%  Moderately severely dysfunction with dilated mid distal anterolateral hypokinesis and severe hypokinesis of the apical and distal inferior wall with an  ejection fraction of 35-40% and vigorous contraction in the basal inferior segments.  Successful percutaneous coronary intervention to the LAD with a 99% stenosis being reduced to 0% with insertion of a 3.0x18 mm science DES stent, postdilated 3.25 mm.  Troy Sine, MD, [facility] 03/24/2013 8:11 PM

## 2013-03-24 NOTE — ED Notes (Signed)
Up to br with assist. Tolerated activity well.

## 2013-03-24 NOTE — ED Provider Notes (Signed)
CSN: [id number]     Arrival date & time 03/23/13  2137 History   First MD Initiated Contact with Patient 03/24/13 0000     Chief Complaint  Patient presents with  . Chest Pain   (Consider location/radiation/quality/duration/timing/severity/associated sxs/prior Treatment) HPI Patient with history of poorly controlled diabetes, hyperlipidemia and hypertension presents with acute onset left-sided chest pain starting around 7:30 PM this evening. Sensation is watching television when the pain started. The pain radiates to her left arm. Pain is described as dull. There is no associated shortness of breath. She has no cough or fever. She has no lower extremity pain or discomfort. She states she's never had a cardiac workup. She took a baby aspirin shortly before coming to the emergency department.  Past Medical History  Diagnosis Date  . BENIGN NEOPLASM OF SKIN SITE UNSPECIFIED 09/08/2008  . CEREBROVASCULAR DISEASE 12/11/2008  . DIABETES MELLITUS, TYPE II 10/12/2006  . HYPERLIPIDEMIA 10/12/2006  . HYPERTENSION 10/12/2006  . PERIPHERAL NEUROPATHY 10/21/2006  . Endometriosis   . Fibroids    Past Surgical History  Procedure Laterality Date  . Abdominal hysterectomy     No family history on file. History  Substance Use Topics  . Smoking status: Never Smoker   . Smokeless tobacco: Never Used  . Alcohol Use: No   OB History   Grav Para Term Preterm Abortions TAB SAB Ect Mult Living                 Review of Systems  Constitutional: Negative for fever and chills.  Respiratory: Negative for cough and shortness of breath.   Cardiovascular: Positive for chest pain. Negative for palpitations and leg swelling.  Gastrointestinal: Negative for nausea, vomiting, abdominal pain, diarrhea and constipation.  Musculoskeletal: Negative for back pain, myalgias, neck pain and neck stiffness.  Skin: Negative for rash and wound.  Neurological: Negative for dizziness, weakness, numbness and headaches.  All other  systems reviewed and are negative.    Allergies  Review of patient's allergies indicates no known allergies.  Home Medications   Current Outpatient Rx  Name  Route  Sig  Dispense  Refill  . acetaminophen (TYLENOL) 650 MG CR tablet   Oral   Take 1,300 mg by mouth daily as needed for pain.         Marland Kitchen amLODipine (NORVASC) 10 MG tablet   Oral   Take 10 mg by mouth daily.         Marland Kitchen aspirin EC 81 MG tablet   Oral   Take 81 mg by mouth daily.         Marland Kitchen atorvastatin (LIPITOR) 80 MG tablet   Oral   Take 80 mg by mouth at bedtime.         . Canagliflozin (INVOKANA) 100 MG TABS   Oral   Take 1 tablet (100 mg total) by mouth daily.   90 tablet   4   . chlorthalidone (HYGROTON) 25 MG tablet   Oral   Take 25 mg by mouth daily.         Marland Kitchen gabapentin (NEURONTIN) 300 MG capsule   Oral   Take 300 mg by mouth 3 (three) times daily as needed (pain).          . insulin aspart (NOVOLOG FLEXPEN) 100 UNIT/ML FlexPen   Subcutaneous   Inject 38-42 Units into the skin 3 (three) times daily with meals. 42 units before breakfast, 38 units before lunch and dinner         .  Insulin Glargine (LANTUS SOLOSTAR) 100 UNIT/ML Solostar Pen   Subcutaneous   Inject 60 Units into the skin at bedtime.         Marland Kitchen labetalol (NORMODYNE) 300 MG tablet   Oral   Take 1 tablet (300 mg total) by mouth 2 (two) times daily.   180 tablet   4   . losartan (COZAAR) 100 MG tablet   Oral   Take 1 tablet (100 mg total) by mouth daily.   90 tablet   1   . metFORMIN (GLUCOPHAGE) 500 MG tablet   Oral   Take 1,000 mg by mouth 2 (two) times daily with a meal.         . potassium chloride SA (K-DUR,KLOR-CON) 20 MEQ tablet   Oral   Take 20 mEq by mouth daily.         Marland Kitchen glucose blood (FREESTYLE LITE) test strip      Use 4x a day. Dx code: 250.60   200 each   12   . Insulin Pen Needle (B-D UF III MINI PEN NEEDLES) 31G X 5 MM MISC      USE ONE  4 TIMES DAILY WITH MEALS AND AT BEDTIME   100  each   12   . Lancets (FREESTYLE) lancets      Test blood sugar 4 (four) times daily as instructed   150 each   2    BP 136/69  Pulse 89  Temp(Src) 97.9 F (36.6 C) (Oral)  Resp 14  SpO2 100% Physical Exam  Nursing note and vitals reviewed. Constitutional: She is oriented to person, place, and time. She appears well-developed and well-nourished. No distress.  HENT:  Head: Normocephalic and atraumatic.  Mouth/Throat: Oropharynx is clear and moist.  Eyes: EOM are normal. Pupils are equal, round, and reactive to light.  Neck: Normal range of motion. Neck supple.  Cardiovascular: Normal rate and regular rhythm.   Pulmonary/Chest: Effort normal and breath sounds normal. No respiratory distress. She has no wheezes. She has no rales. She exhibits no tenderness.  Abdominal: Soft. Bowel sounds are normal. She exhibits no distension and no mass. There is no tenderness. There is no rebound and no guarding.  Musculoskeletal: Normal range of motion. She exhibits no edema and no tenderness.  No calf swelling or tenderness.  Neurological: She is alert and oriented to person, place, and time.  Moves all extremities without deficit. Sensation grossly intact.  Skin: Skin is warm and dry. No rash noted. No erythema.  Psychiatric: She has a normal mood and affect. Her behavior is normal.    ED Course  Procedures (including critical care time) Labs Review Labs Reviewed  CBC WITH DIFFERENTIAL - Abnormal; Notable for the following:    WBC 10.8 (*)    Hemoglobin 11.8 (*)    HCT 35.7 (*)    MCV 76.3 (*)    MCH 25.2 (*)    RDW 16.0 (*)    All other components within normal limits  COMPREHENSIVE METABOLIC PANEL - Abnormal; Notable for the following:    Potassium 3.5 (*)    Chloride 93 (*)    Glucose, Bld 231 (*)    BUN 27 (*)    Creatinine, Ser 1.31 (*)    Total Protein 8.7 (*)    Total Bilirubin <0.2 (*)    GFR calc non Af Amer 46 (*)    GFR calc Af Amer 53 (*)    All other components  within normal limits  TROPONIN  I  D-DIMER, QUANTITATIVE   Imaging Review No results found.   Date: 03/24/2013  Rate: 107  Rhythm: sinus tachycardia  QRS Axis: normal  Intervals: normal  ST/T Wave abnormalities: normal  Conduction Disutrbances:none  Narrative Interpretation:   Old EKG Reviewed: unchanged    MDM  Patient's chest pain is improved to 3/10 with nitroglycerin and aspirin. Dosed with morphine. Discussed with cardiology. Will evaluate the patient in emergency department and admit for chest pain rule out.    Loren Raceravid Courtnie Brenes, MD 03/24/13 [phone]

## 2013-03-24 NOTE — Progress Notes (Addendum)
Patient Name: Lynn Hobbs Date of Encounter: 03/24/2013   Principal Problem:   ACS (acute coronary syndrome) Active Problems:   Essential hypertension   Diabetes with neurologic complications   HYPERLIPIDEMIA   PERIPHERAL NEUROPATHY   SUBJECTIVE  Pt presented last night with chest pain - not worse with positioning or deep breathing.  She ruled in overnight - trop currently 4.86.  She is on heparin.  She initially had some c/p relief with NTP but not complete relief. ECG @ 3AM showed ant and inf ST elevation, which was new compared to admission.  She continues to have 3/10 chest pain.  Follow-up ECG this morning continues to show anterior ST elevation/coving.  She is hemodynamically stable and going to the cath lab urgently.  CURRENT MEDS . amLODipine  10 mg Oral Daily  . [START ON 03/25/2013] aspirin EC  81 mg Oral Daily  . aspirin EC  81 mg Oral Daily  . atorvastatin  80 mg Oral QHS  . insulin aspart  0-5 Units Subcutaneous QHS  . insulin aspart  0-9 Units Subcutaneous TID WC  . losartan  100 mg Oral Daily  . metoprolol tartrate  25 mg Oral BID    OBJECTIVE  Filed Vitals:   03/24/13 0547 03/24/13 0551 03/24/13 0600 03/24/13 0615  BP:   147/76 139/74  Pulse:   101 99  Temp:      TempSrc:      Resp:   14   Height: 5\' 6"  (1.676 m) 5\' 9"  (1.753 m)    Weight: 224 lb 10.4 oz (101.9 kg) 222 lb 14.2 oz (101.1 kg)    SpO2:   98% 100%   No intake or output data in the 24 hours ending 03/24/13 0810 Filed Weights   03/24/13 0515 03/24/13 0547 03/24/13 0551  Weight: 229 lb 4.5 oz (104 kg) 224 lb 10.4 oz (101.9 kg) 222 lb 14.2 oz (101.1 kg)    PHYSICAL EXAM  General: Pleasant, NAD. Neuro: Alert and oriented X 3. Moves all extremities spontaneously. Psych: Normal affect. HEENT:  Normal  Neck: Supple without bruits or JVD. Lungs:  Resp regular and unlabored, CTA. Heart: RRR no s3, s4, or murmurs. Abdomen: Soft, non-tender, non-distended, BS + x 4.  Extremities: No  clubbing, cyanosis or edema. DP/PT/Radials 2+ and equal bilaterally.  Accessory Clinical Findings  CBC  Recent Labs  03/23/13 2150 03/24/13 0642  WBC 10.8* 9.9  NEUTROABS 7.3  --   HGB 11.8* 10.8*  HCT 35.7* 32.2*  MCV 76.3* 74.2*  PLT 228 242   Basic Metabolic Panel  Recent Labs  03/23/13 2150 03/24/13 0642  NA 138 137  K 3.5* 3.2*  CL 93* 92*  CO2 31 29  GLUCOSE 231* 231*  BUN 27* 25*  CREATININE 1.31* 1.11*  CALCIUM 9.9 9.7  MG  --  2.6*   Liver Function Tests  Recent Labs  03/23/13 2150  AST 18  ALT 13  ALKPHOS 61  BILITOT <0.2*  PROT 8.7*  ALBUMIN 4.3   Cardiac Enzymes  Recent Labs  03/23/13 2150 03/24/13 0325 03/24/13 0635  TROPONINI <0.30 0.73* 4.86*   D-Dimer  Recent Labs  03/24/13 0052  DDIMER <0.27    ECG  Rsr, 98, an ST elevation, inf q's  Radiology/Studies  Dg Chest 2 View  03/24/2013   CLINICAL DATA:  Chest pain  EXAM: CHEST  2 VIEW  COMPARISON:  06/03/2005  FINDINGS: Normal heart size and mediastinal contours. No acute infiltrate or edema.  No effusion or pneumothorax. No acute osseous findings.  IMPRESSION: No active cardiopulmonary disease.   Electronically Signed   By: Jonathan  Watts M.D.   On: 03/24/2013 01:03    ASSESSMENT AND PLAN  1.  Acute Ant STEMI:  Pt presented last night with chest pain with some relief from nitrates but not complete relief.  She has cont to have 3/10 c/p overnight and has ruled in.  ECG has progressively worsened overnight and she cont to have anterior ST elevation this morning.  We have called the cath lab and she will be taken over for emergent cath.  She received 324 of asa this AM.  She is on heparin.  Cont bb, statin.  Eventual cardiac rehab post-cath.  2.  HTN:  Stable.  Cont bb/arb.  3.  HL:  Cont high potency statin.  LDL 214 in 11/2012.  LFT's wnl yesterday.  4.  DM:  Cont SSI.  Resume home dose of lantus.  Hold metformin.  5.  Hypokalemia:  supp.  Signed, Christopher Berge  NP   The patient was seen, examined and discussed with Chris Berger, NP and agree as above.  52 year old female with h/o DM and 2 prior strokes (2007, 2009), no prior CAD history, who came the last night with typical retrosternal chest that was minimally relieved by NTG./ ECG on arrival normal, at 3 am shows anterior and inferior STEMI, currently anterior STEMI. Troponin raised from normal to 4.86 and is still upwarding. The patient has ongoing pain. We will called code STEMI. Continue Heparin, ASA, metoprolol, losartan, nitro paste, high dose atorvastatin.  [name], H 03/24/2013  

## 2013-03-25 DIAGNOSIS — I517 Cardiomegaly: Secondary | ICD-10-CM

## 2013-03-25 LAB — LIPID PANEL
Cholesterol: 259 mg/dL — ABNORMAL HIGH (ref 0–200)
HDL: 56 mg/dL (ref >39)
LDL Cholesterol: 184 mg/dL — ABNORMAL HIGH (ref 0–99)
Total CHOL/HDL Ratio: 4.6 RATIO
Triglycerides: 93 mg/dL (ref <150)
VLDL: 19 mg/dL (ref 0–40)

## 2013-03-25 LAB — CBC
HCT: 32.5 % — ABNORMAL LOW (ref 36.0–46.0)
Hemoglobin: 10.8 g/dL — ABNORMAL LOW (ref 12.0–15.0)
MCH: 24.8 pg — ABNORMAL LOW (ref 26.0–34.0)
MCHC: 33.2 g/dL (ref 30.0–36.0)
MCV: 74.5 fL — ABNORMAL LOW (ref 78.0–100.0)
Platelets: 209 10*3/uL (ref 150–400)
RBC: 4.36 MIL/uL (ref 3.87–5.11)
RDW: 16 % — ABNORMAL HIGH (ref 11.5–15.5)
WBC: 9.7 10*3/uL (ref 4.0–10.5)

## 2013-03-25 LAB — BASIC METABOLIC PANEL
BUN: 19 mg/dL (ref 6–23)
CALCIUM: 9.2 mg/dL (ref 8.4–10.5)
CO2: 26 mEq/L (ref 19–32)
CREATININE: 1.09 mg/dL (ref 0.50–1.10)
Chloride: 101 mEq/L (ref 96–112)
GFR calc Af Amer: 66 mL/min — ABNORMAL LOW (ref >90)
GFR calc non Af Amer: 57 mL/min — ABNORMAL LOW (ref >90)
Glucose, Bld: 138 mg/dL — ABNORMAL HIGH (ref 70–99)
Potassium: 3.6 mEq/L — ABNORMAL LOW (ref 3.7–5.3)
Sodium: 141 mEq/L (ref 137–147)

## 2013-03-25 LAB — GLUCOSE, CAPILLARY
GLUCOSE-CAPILLARY: 113 mg/dL — AB (ref 70–99)
GLUCOSE-CAPILLARY: 207 mg/dL — AB (ref 70–99)
Glucose-Capillary: 136 mg/dL — ABNORMAL HIGH (ref 70–99)
Glucose-Capillary: 183 mg/dL — ABNORMAL HIGH (ref 70–99)

## 2013-03-25 LAB — TROPONIN I

## 2013-03-25 MED ORDER — PERFLUTREN LIPID MICROSPHERE
INTRAVENOUS | Status: AC
  Administered 2013-03-25: 11:00:00
  Filled 2013-03-25: qty 10

## 2013-03-25 MED FILL — Sodium Chloride IV Soln 0.9%: INTRAVENOUS | Qty: 50 | Status: AC

## 2013-03-25 NOTE — Progress Notes (Signed)
CARDIAC REHAB PHASE I   PRE:  Rate/Rhythm: 95 SR  BP:  Supine:   Sitting: 111/60  Standing:    SaO2:   MODE:  Ambulation: 350 ft   POST:  Rate/Rhythm: 111 ST  BP:  Supine:   Sitting: 135/71  Standing:    SaO2:  1155-1210 Pt tolerated ambulation well using her cane. Gait steady. She denies any cp or SOB. VS stable. Completed MI and stent education with pt and mother. Pt voices understanding. Pt agrees to [place] in [place], will send referral.  Rodney Langton RN 03/25/2013 1:09 PM

## 2013-03-25 NOTE — Progress Notes (Signed)
Clinical Social Work Department BRIEF PSYCHOSOCIAL ASSESSMENT 03/25/2013  Patient:  Lynn Hobbs, Lynn Hobbs     Account Number:  [id number]     Admit date:  03/23/2013  Clinical Social Worker:  Freeman Caldron  Date/Time:  03/25/2013 11:05 AM  Referred by:  Physician  Date Referred:  03/25/2013 Referred for  Advanced Directives   Other Referral:   Interview type:  Patient Other interview type:    PSYCHOSOCIAL DATA Living Status:  FAMILY Admitted from facility:   Level of care:   Primary support name:  [place] [phone]) Primary support relationship to patient:  PARENT Degree of support available:   Good--pt lives with her mother, who provides support to pt.    CURRENT CONCERNS Current Concerns  Other - See comment   Other Concerns:   Advanced directives.    SOCIAL WORK ASSESSMENT / PLAN CSW went to pt's bedside and explained advanced directives and HCPOA and that CSW was consulted because pt might be interested in this. Pt explained she lives with her mother and that she would designate her mother to make decisions, but she did not want to fill out HCPOA paperwork. CSW asked if her mother is the closest blood relative pt has and if she is married, and because pt states she is not married and her mother is the closest relative, CSW explained that decisions would default to pt's mother and she would serve to make decisions for pt. Pt relieved to hear this. CSW explained advanced directives and pt states she would prefer to just let her mother make decisions and she would not like to fill out advanced directives. Pt explained that she is feeling very well today, "220%," and pt states she will likely be moved to the floor soon and anticipates that she might be able to get out of the hospital tomorrow. Pt asked about a bill she has with her eye doctor, and explained that this has reached over $500 and she wanted to create a payment plan to pay this. Pt states she called the MD office  and requested to create a payment plan, and asked if there is anything the financial counselor/social worker could do from the hospital standpoint. CSW explained that CSW will call the financial counselor and inquire about this, but that it sounds like the pt is doing the right thing by calling the office directly and inquiring about creating a payment plan. Pt states the secretary at the MD office told her she can create a payment plan, but the pt is concerned because she has not heard back. CSW encouraged the pt to follow up with the MD office directly concerning this, and called the financial counselor and left a voicemail asking if she has any additional input/opinion about how to help pt. Pt was understanding that the financial counselor might not be able to assist with this, but CSW wanted to follow up in the event that some assistance might be available to pt.   Assessment/plan status:  No Further Intervention Required Other assessment/ plan:   Information/referral to community resources:   None--pt kindly declined to fill out advanced directives.    PATIENT'S/FAMILY'S RESPONSE TO PLAN OF CARE: Good--pt very friendly and engaged in conversation with CSW. Smiled throughout conversation and states she is feeling much better today, and she is looking forward to getting out of the hospital and going back home. CSW provided support to pt as she described the chest pain she was having before coming to the hospital. Pt thanked  CSW for coming to fill out advanced directives and HCPOA, even though she was not interested in these.       Ky Barban, MSW, [facility] Clinical Social Worker [phone]

## 2013-03-25 NOTE — Progress Notes (Signed)
Patient ID: Lynn Hobbs, female   DOB: March 28, 1960, 53 y.o.   MRN: [id number]    Subjective:  Denies SSCP, palpitations or Dyspnea   Objective:  Filed Vitals:   03/25/13 0500 03/25/13 0609 03/25/13 0700 03/25/13 0800  BP: 124/69 135/70 125/74 130/83  Pulse: 85     Temp:    98.3 F (36.8 C)  TempSrc:    Oral  Resp:      Height:      Weight:      SpO2: 98%   98%    Intake/Output from previous day:  Intake/Output Summary (Last 24 hours) at 03/25/13 0846 Last data filed at 03/25/13 0800  Gross per 24 hour  Intake 2381.33 ml  Output   1250 ml  Net 1131.33 ml    Physical Exam: Affect appropriate Healthy:  appears stated age HEENT: normal Neck supple with no adenopathy JVP normal no bruits no thyromegaly Lungs clear with no wheezing and good diaphragmatic motion Heart:  S1/S2 no murmur, no rub, gallop or click PMI normal Abdomen: benighn, BS positve, no tenderness, no AAA no bruit.  No HSM or HJR Distal pulses intact with no bruits No edema Neuro non-focal Skin warm and dry No muscular weakness   Lab Results: Basic Metabolic Panel:  Recent Labs  03/23/13 2150 03/24/13 0642 03/25/13 0321  NA 138 137 141  K 3.5* 3.2* 3.6*  CL 93* 92* 101  CO2 31 29 26   GLUCOSE 231* 231* 138*  BUN 27* 25* 19  CREATININE 1.31* 1.11* 1.09  CALCIUM 9.9 9.7 9.2  MG  --  2.6*  --    Liver Function Tests:  Recent Labs  03/23/13 2150  AST 18  ALT 13  ALKPHOS 61  BILITOT <0.2*  PROT 8.7*  ALBUMIN 4.3   CBC:  Recent Labs  03/23/13 2150 03/24/13 0642 03/25/13 0321  WBC 10.8* 9.9 9.7  NEUTROABS 7.3  --   --   HGB 11.8* 10.8* 10.8*  HCT 35.7* 32.2* 32.5*  MCV 76.3* 74.2* 74.5*  PLT 228 214 209   Cardiac Enzymes:  Recent Labs  03/24/13 0635 03/24/13 1800 03/24/13 2353  TROPONINI 4.86* >20.00* >20.00*   D-Dimer:  Recent Labs  03/24/13 0052  DDIMER <0.27   Fasting Lipid Panel:  Recent Labs  03/25/13 0321  CHOL 259*  HDL 56  LDLCALC 184*    TRIG 93  CHOLHDL 4.6    Imaging: Dg Chest 2 View  03/24/2013   CLINICAL DATA:  Chest pain  EXAM: CHEST  2 VIEW  COMPARISON:  06/03/2005  FINDINGS: Normal heart size and mediastinal contours. No acute infiltrate or edema. No effusion or pneumothorax. No acute osseous findings.  IMPRESSION: No active cardiopulmonary disease.   Electronically Signed   By: Jorje Guild M.D.   On: 03/24/2013 01:03    Cardiac Studies:  ECG:  SR evolving anterolateral MI with loss of R waves    Telemetry:  NSR no VT   Echo:   Medications:   . amLODipine  10 mg Oral Daily  . aspirin EC  81 mg Oral Daily  . aspirin EC  81 mg Oral Daily  . aspirin EC  81 mg Oral Daily  . atorvastatin  80 mg Oral QHS  . clopidogrel  75 mg Oral Q breakfast  . insulin aspart  0-5 Units Subcutaneous QHS  . insulin aspart  0-9 Units Subcutaneous TID WC  . insulin glargine  60 Units Subcutaneous QHS  . losartan  100  mg Oral Daily  . metoprolol tartrate  25 mg Oral BID     . sodium chloride Stopped (03/25/13 0500)  . nitroGLYCERIN 5 mcg/min (03/24/13 2001)    Assessment/Plan:  Anterior MI:  Post PCI/Stent DAT for a year some residual diffuse RCA disease  Echo today for EF Possible d/c in am transfer to [facility] Chol:  Statin f/u labs 3 months lower dose at that time to 40 mg HTN:  Continue ARB    [name] Rouge 03/25/2013, 8:46 AM

## 2013-03-25 NOTE — Progress Notes (Signed)
  Echocardiogram 2D Echocardiogram has been performed.  Lynn Hobbs 03/25/2013, 10:46 AM

## 2013-03-26 DIAGNOSIS — I429 Cardiomyopathy, unspecified: Secondary | ICD-10-CM

## 2013-03-26 DIAGNOSIS — E1149 Type 2 diabetes mellitus with other diabetic neurological complication: Secondary | ICD-10-CM

## 2013-03-26 DIAGNOSIS — I10 Essential (primary) hypertension: Secondary | ICD-10-CM

## 2013-03-26 DIAGNOSIS — I2109 ST elevation (STEMI) myocardial infarction involving other coronary artery of anterior wall: Secondary | ICD-10-CM

## 2013-03-26 LAB — GLUCOSE, CAPILLARY
Glucose-Capillary: 117 mg/dL — ABNORMAL HIGH (ref 70–99)
Glucose-Capillary: 160 mg/dL — ABNORMAL HIGH (ref 70–99)
Glucose-Capillary: 123 mg/dL — ABNORMAL HIGH (ref 70–99)
Glucose-Capillary: 153 mg/dL — ABNORMAL HIGH (ref 70–99)

## 2013-03-26 LAB — CBC
HEMATOCRIT: 33.8 % — AB (ref 36.0–46.0)
Hemoglobin: 11 g/dL — ABNORMAL LOW (ref 12.0–15.0)
MCH: 24.6 pg — AB (ref 26.0–34.0)
MCHC: 32.5 g/dL (ref 30.0–36.0)
MCV: 75.6 fL — ABNORMAL LOW (ref 78.0–100.0)
Platelets: 214 10*3/uL (ref 150–400)
RBC: 4.47 MIL/uL (ref 3.87–5.11)
RDW: 16.3 % — ABNORMAL HIGH (ref 11.5–15.5)
WBC: 11 10*3/uL — ABNORMAL HIGH (ref 4.0–10.5)

## 2013-03-26 MED ORDER — CARVEDILOL 6.25 MG PO TABS
6.2500 mg | ORAL_TABLET | Freq: Two times a day (BID) | ORAL | Status: DC
  Administered 2013-03-26 – 2013-03-27 (×2): 6.25 mg via ORAL
  Filled 2013-03-26 (×4): qty 1

## 2013-03-26 NOTE — Progress Notes (Addendum)
Primary cardiologist: Dr. Dani Gobble Croitoru  Subjective:    Patient up and rhythm, no angina or breathlessness. Has done some ambulation in the hall.  Objective:   Temp:  [98.3 F (36.8 C)-99.3 F (37.4 C)] 99.3 F (37.4 C) (01/17 0413) Pulse Rate:  [89] 89 (01/17 0413) Resp:  [17] 17 (01/17 0413) BP: (99-132)/(64-83) 127/73 mmHg (01/17 0413) SpO2:  [97 %-98 %] 97 % (01/17 0413) Last BM Date: 03/25/13  Filed Weights   03/24/13 0515 03/24/13 0547 03/24/13 0551  Weight: 229 lb 4.5 oz (104 kg) 224 lb 10.4 oz (101.9 kg) 222 lb 14.2 oz (101.1 kg)    Intake/Output Summary (Last 24 hours) at 03/26/13 0756 Last data filed at 03/25/13 0800  Gross per 24 hour  Intake    240 ml  Output      0 ml  Net    240 ml    Telemetry: Sinus rhythm.  Exam:  General: No distress.  Lungs: Clear, nonlabored.  Cardiac: RRR, no gallop or rub.  Abdomen: NABS.  Extremities: No pitting edema.   Lab Results:  Basic Metabolic Panel:  Recent Labs Lab 03/23/13 2150 03/24/13 0642 03/25/13 0321  NA 138 137 141  K 3.5* 3.2* 3.6*  CL 93* 92* 101  CO2 31 29 26   GLUCOSE 231* 231* 138*  BUN 27* 25* 19  CREATININE 1.31* 1.11* 1.09  CALCIUM 9.9 9.7 9.2  MG  --  2.6*  --     Liver Function Tests:  Recent Labs Lab 03/23/13 2150  AST 18  ALT 13  ALKPHOS 61  BILITOT <0.2*  PROT 8.7*  ALBUMIN 4.3    CBC:  Recent Labs Lab 03/24/13 0642 03/25/13 0321 03/26/13 0325  WBC 9.9 9.7 11.0*  HGB 10.8* 10.8* 11.0*  HCT 32.2* 32.5* 33.8*  MCV 74.2* 74.5* 75.6*  PLT 214 209 214    Cardiac Enzymes:  Recent Labs Lab 03/24/13 0635 03/24/13 1800 03/24/13 2353  TROPONINI 4.86* >20.00* >20.00*    Echocardiogram (1/16): Study Conclusions  Left ventricle: The cavity size was mildly dilated. Wall thickness was normal. Systolic function was moderately reduced. The estimated ejection fraction was in the range of 30% to 35%. Akinesis of the mid-distalanterior and  apical myocardium. There was an increased relative contribution of atrial contraction to ventricular filling.    Medications:   Scheduled Medications: . amLODipine  10 mg Oral Daily  . aspirin EC  81 mg Oral Daily  . atorvastatin  80 mg Oral QHS  . clopidogrel  75 mg Oral Q breakfast  . insulin aspart  0-5 Units Subcutaneous QHS  . insulin aspart  0-9 Units Subcutaneous TID WC  . insulin glargine  60 Units Subcutaneous QHS  . losartan  100 mg Oral Daily  . metoprolol tartrate  25 mg Oral BID     Infusions: . sodium chloride Stopped (03/25/13 0500)  . nitroGLYCERIN 5 mcg/min (03/24/13 2001)     PRN Medications:  acetaminophen, gabapentin, nitroGLYCERIN, ondansetron (ZOFRAN) IV   Assessment:   1. 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 LVEF 30-35% with mid to distal anterior akinesis by echocardiogram.  2. Type 2 diabetes mellitus with neuropathy. Hemoglobin A1c 8.8. On insulin.  3. Hypertension, blood pressure currently controlled.  4. Hyperlipidemia, LDL 184. Now on high-dose statin therapy.   Plan/Discussion:    Have discussed the situation with the patient including recent assessment of LVEF and implications. Continue aspirin, Plavix, high-dose Lipitor, Norvasc, Cozaar. Will  switch from Lopressor to Coreg. Also discussed arranging LifeVest for potential discharge tomorrow.    Satira Sark, M.D., F.A.C.C.

## 2013-03-26 NOTE — Progress Notes (Addendum)
CARDIAC REHAB PHASE I   PRE:  Rate/Rhythm: 105 ST  BP:  Sitting: 112/74     SaO2: 100% RA  MODE:  Ambulation: 400 ft   POST:  Rate/Rhythm: 118 ST  BP:  Sitting: 114/70     SaO2: 100% RA  2:20pm-2:40PM Patient walked independently with her cane. Patient walked at a slow but steady pace.  She did not complain of any shortness of breath or chest pain.  Patient was encouraged to try cardiac rehab again but she states that she can not afford any copays.  Patient was left in her bed with mother at bed side.    Vonita Moss [place], [place] 03/26/2013 2:34 PM

## 2013-03-27 LAB — CBC
HEMATOCRIT: 33.5 % — AB (ref 36.0–46.0)
Hemoglobin: 10.9 g/dL — ABNORMAL LOW (ref 12.0–15.0)
MCH: 24.5 pg — ABNORMAL LOW (ref 26.0–34.0)
MCHC: 32.5 g/dL (ref 30.0–36.0)
MCV: 75.3 fL — AB (ref 78.0–100.0)
Platelets: 207 10*3/uL (ref 150–400)
RBC: 4.45 MIL/uL (ref 3.87–5.11)
RDW: 16.2 % — ABNORMAL HIGH (ref 11.5–15.5)
WBC: 9.6 10*3/uL (ref 4.0–10.5)

## 2013-03-27 LAB — BASIC METABOLIC PANEL
BUN: 24 mg/dL — AB (ref 6–23)
CO2: 26 meq/L (ref 19–32)
CREATININE: 1.06 mg/dL (ref 0.50–1.10)
Calcium: 9.2 mg/dL (ref 8.4–10.5)
Chloride: 98 mEq/L (ref 96–112)
GFR calc Af Amer: 69 mL/min — ABNORMAL LOW (ref >90)
GFR calc non Af Amer: 59 mL/min — ABNORMAL LOW (ref >90)
Glucose, Bld: 87 mg/dL (ref 70–99)
Potassium: 3.1 mEq/L — ABNORMAL LOW (ref 3.7–5.3)
Sodium: 140 mEq/L (ref 137–147)

## 2013-03-27 MED ORDER — NITROGLYCERIN 0.4 MG SL SUBL: 0.4000 mg | SUBLINGUAL_TABLET | SUBLINGUAL | Status: DC | PRN

## 2013-03-27 MED ORDER — CARVEDILOL 3.125 MG PO TABS: 9.3750 mg | ORAL_TABLET | Freq: Two times a day (BID) | ORAL | Status: DC

## 2013-03-27 MED ORDER — POTASSIUM CHLORIDE CRYS ER 20 MEQ PO TBCR
30.0000 meq | EXTENDED_RELEASE_TABLET | Freq: Once | ORAL | Status: AC
  Administered 2013-03-27: 30 meq via ORAL
  Filled 2013-03-27: qty 1

## 2013-03-27 MED ORDER — CLOPIDOGREL BISULFATE 75 MG PO TABS: 75.0000 mg | ORAL_TABLET | Freq: Every day | ORAL | Status: DC

## 2013-03-27 MED ORDER — CARVEDILOL 6.25 MG PO TABS
9.3750 mg | ORAL_TABLET | Freq: Two times a day (BID) | ORAL | Status: DC
  Filled 2013-03-27 (×2): qty 1

## 2013-03-27 NOTE — Progress Notes (Signed)
Primary cardiologist: Dr. Dani Gobble Croitoru  Subjective:    No chest pain or breathlessness. Patient states she is ready to go home.  Objective:   Temp:  [98.6 F (37 C)-99 F (37.2 C)] 98.6 F (37 C) (01/18 0516) Pulse Rate:  [87-102] 87 (01/18 0516) Resp:  [16-20] 18 (01/18 0516) BP: (112-138)/(71-87) 137/83 mmHg (01/18 0516) SpO2:  [95 %-100 %] 95 % (01/18 0516) Last BM Date: 03/25/13  Filed Weights   03/24/13 0515 03/24/13 0547 03/24/13 0551  Weight: 229 lb 4.5 oz (104 kg) 224 lb 10.4 oz (101.9 kg) 222 lb 14.2 oz (101.1 kg)    Intake/Output Summary (Last 24 hours) at 03/27/13 0739 Last data filed at 03/26/13 1815  Gross per 24 hour  Intake    840 ml  Output      0 ml  Net    840 ml    Telemetry: Sinus rhythm and sinus tachycardia.  Exam:  General: No distress.  Lungs: Clear, nonlabored.  Cardiac: RRR, no gallop or rub.  Abdomen: NABS.  Extremities: No pitting edema.   Lab Results:  Basic Metabolic Panel:  Recent Labs Lab 03/23/13 2150 03/24/13 0642 03/25/13 0321 03/27/13 0359  NA 138 137 141 140  K 3.5* 3.2* 3.6* 3.1*  CL 93* 92* 101 98  CO2 31 29 26 26   GLUCOSE 231* 231* 138* 87  BUN 27* 25* 19 24*  CREATININE 1.31* 1.11* 1.09 1.06  CALCIUM 9.9 9.7 9.2 9.2  MG  --  2.6*  --   --     Liver Function Tests:  Recent Labs Lab 03/23/13 2150  AST 18  ALT 13  ALKPHOS 61  BILITOT <0.2*  PROT 8.7*  ALBUMIN 4.3    CBC:  Recent Labs Lab 03/25/13 0321 03/26/13 0325 03/27/13 0359  WBC 9.7 11.0* 9.6  HGB 10.8* 11.0* 10.9*  HCT 32.5* 33.8* 33.5*  MCV 74.5* 75.6* 75.3*  PLT 209 214 207    Cardiac Enzymes:  Recent Labs Lab 03/24/13 0635 03/24/13 1800 03/24/13 2353  TROPONINI 4.86* >20.00* >20.00*    Echocardiogram (1/16): Study Conclusions  Left ventricle: The cavity size was mildly dilated. Wall thickness was normal. Systolic function was moderately reduced. The estimated ejection fraction was in the range of 30% to  35%. Akinesis of the mid-distalanterior and apical myocardium. There was an increased relative contribution of atrial contraction to ventricular filling.    Medications:   Scheduled Medications: . amLODipine  10 mg Oral Daily  . aspirin EC  81 mg Oral Daily  . atorvastatin  80 mg Oral QHS  . carvedilol  6.25 mg Oral BID WC  . clopidogrel  75 mg Oral Q breakfast  . insulin aspart  0-5 Units Subcutaneous QHS  . insulin aspart  0-9 Units Subcutaneous TID WC  . insulin glargine  60 Units Subcutaneous QHS  . losartan  100 mg Oral Daily    Infusions: . sodium chloride Stopped (03/25/13 0500)  . nitroGLYCERIN 5 mcg/min (03/24/13 2001)    PRN Medications: acetaminophen, gabapentin, nitroGLYCERIN, ondansetron (ZOFRAN) IV   Assessment:   1. 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 LVEF 30-35% with mid to distal anterior akinesis by echocardiogram. She has had instruction in use of LifeVest for D/C.  2. Type 2 diabetes mellitus with neuropathy. Hemoglobin A1c 8.8. On insulin.  3. Hypertension, blood pressure currently controlled.  4. Hyperlipidemia, LDL 184. Now on high-dose statin therapy.  5. Hypokalemia.   Plan/Discussion:  Plan is for discharge home today. She has LifeVest in place, will need to have a one-week TOC visit with Dr. Sallyanne Kuster. Discharge on present medications except increase Coreg to 9.375 mg twice daily. Potassium is being repleted this morning. She does not need a standing potassium supplement this time. She will eventually need followup echocardiogram in the next 6-8 weeks for reassessment of LVEF.  Satira Sark, M.D., F.A.C.C.

## 2013-03-27 NOTE — Discharge Summary (Signed)
Physician Discharge Summary  Patient ID: Lynn Hobbs MRN: [id number] DOB/AGE: Mar 27, 1960 53 y.o.  Admit date: 03/23/2013 Discharge date: 03/27/2013  Primary Discharge Diagnosis 1. Anterior LAD STEMI 2. CAD: S/P PCI to LAD 3.Systolic Dysfunction  Secondary Discharge Diagnosis: 1. Type II Diabetes 2. Hypertension 3. Hypercholesterolemia  Significant Diagnostic Studies: ACS/ST segment elevation anterior wall myocardial infarction secondary to subtotal LAD stenosis after the first diagonal branch with initial TIMI 1/2 flow; diffuse 50-60% distal LAD stenoses and 40 - 50% DX1 stenosis.  Normal circumflex coronary artery with mild luminal irregularities.  Diffusely diseased RCA artery with diffuse stenoses of 70-80%  Moderately severely dysfunction with dilated mid distal anterolateral hypokinesis and severe hypokinesis of the apical and distal inferior wall with an ejection fraction of 35-40% and vigorous contraction in the basal inferior segments.  Successful percutaneous coronary intervention to the LAD with a 99% stenosis being reduced to 0% with insertion of a 3.0x18 mm science DES stent, postdilated 3.25 mm.  Hospital Course: 53 year old female patient who presented to [facility] in the setting of ST elevation MI with no prior history of CAD. Patient is post for cardiovascular risk factors. She has had a history of prior CVA x2. The patient was in emergency only to cardiac catheterization lab where she was found to have 99% LAD just beyond the proximal septal and diagonal vessel. The right coronary artery was also severely diffusely diseased with a short prescription, with 40% near ostial narrowing diffuse 70-78% mid stenosis, 80% stenosis before the acute marginal, with 6% distal RCA stenosis. The patient had successful PCI of the LAD with PTCA and stenting using a 3.0x18 mm Xience Alpine stent.   The patient was started on dual antiplatelet therapy with aspirin and Plavix. She  was started on carvedilol 9.376 milligrams twice a day., with echocardiogram completed post procedure demonstrated ejection fraction of 30-35% with akinesis of the mid distal anterior and apical myocardium. Cavity size and wall thickness was normal. The patient recovered well. She was treated for diabetes and neuropathy, with a hemoglobin A1c of 8.1. .    On 03/26/2013 the patient had a 32nd run of V. [place]. The patient was asleep but easily aroused and asymptomatic. Lipase was ordered and placed on patient prior to discharge. Instructions on use of this test was completed prior to discharge. She is to followup to proceed with Dr. Sallyanne Kuster MD.   Discharge Exam: Blood pressure 137/83, pulse 87, temperature 98.6 F (37 C), temperature source Oral, resp. rate 18, height 5\' 9"  (1.753 m), weight 222 lb 14.2 oz (101.1 kg), SpO2 95.00%.    Labs:   Lab Results  Component Value Date   WBC 9.6 03/27/2013   HGB 10.9* 03/27/2013   HCT 33.5* 03/27/2013   MCV 75.3* 03/27/2013   PLT 207 03/27/2013    Recent Labs Lab 03/23/13 2150  03/27/13 0359  NA 138  < > 140  K 3.5*  < > 3.1*  CL 93*  < > 98  CO2 31  < > 26  BUN 27*  < > 24*  CREATININE 1.31*  < > 1.06  CALCIUM 9.9  < > 9.2  PROT 8.7*  --   --   BILITOT <0.2*  --   --   ALKPHOS 61  --   --   ALT 13  --   --   AST 18  --   --   GLUCOSE 231*  < > 87  < > = values  in this interval not displayed. Lab Results  Component Value Date   TROPONINI >20.00* 03/24/2013    Lab Results  Component Value Date   CHOL 259* 03/25/2013   CHOL 304* 12/02/2012   CHOL 296* 07/02/2010   Lab Results  Component Value Date   HDL 56 03/25/2013   HDL 52.40 12/02/2012   HDL 50.40 07/02/2010   Lab Results  Component Value Date   LDLCALC 184* 03/25/2013   Lab Results  Component Value Date   TRIG 93 03/25/2013   TRIG 182.0* 12/02/2012   TRIG 162.0* 07/02/2010   Lab Results  Component Value Date   CHOLHDL 4.6 03/25/2013   CHOLHDL 6 12/02/2012   CHOLHDL 6 07/02/2010    Lab Results  Component Value Date   LDLDIRECT 214.4 12/02/2012   LDLDIRECT 206.7 07/02/2010   LDLDIRECT 197.2 12/04/2008      Radiology: Dg Chest 2 View  03/24/2013   CLINICAL DATA:  Chest pain  EXAM: CHEST  2 VIEW  COMPARISON:  06/03/2005  FINDINGS: Normal heart size and mediastinal contours. No acute infiltrate or edema. No effusion or pneumothorax. No acute osseous findings.  IMPRESSION: No active cardiopulmonary disease.   Electronically Signed   By: Jorje Guild M.D.   On: 03/24/2013 01:03    EKG: Normal sinus rhythm rate of 93 bpm Inferior infarct , age undetermined Anterolateral infarct , age undetermined  [place]     Discharge Orders   Future Appointments Provider Department Dept Phone   05/05/2013 10:00 AM Philemon Kingdom, MD [facility] End[facility]inology [phone]   06/27/2013 8:15 AM Hayden Pedro, MD [facility] [phone]   09/06/2013 10:00 AM Marletta Lor, MD [facility] [place]   Future Orders Complete By Expires   Amb Referral to Cardiac Rehabilitation  As directed    Diet - low sodium heart healthy  As directed    Increase activity slowly  As directed        Medication List         acetaminophen 650 MG CR tablet  Commonly known as:  TYLENOL  Take 1,300 mg by mouth daily as needed for pain.     amLODipine 10 MG tablet  Commonly known as:  NORVASC  Take 10 mg by mouth daily.     aspirin EC 81 MG tablet  Take 81 mg by mouth daily.     atorvastatin 80 MG tablet  Commonly known as:  LIPITOR  Take 80 mg by mouth at bedtime.     Canagliflozin 100 MG Tabs  Commonly known as:  INVOKANA  Take 1 tablet (100 mg total) by mouth daily.     carvedilol 3.125 MG tablet  Commonly known as:  COREG  Take 3 tablets (9.375 mg total) by mouth 2 (two) times daily with a meal.     chlorthalidone 25 MG tablet  Commonly known as:  HYGROTON  Take 25 mg by mouth daily.      clopidogrel 75 MG tablet  Commonly known as:  PLAVIX  Take 1 tablet (75 mg total) by mouth daily with breakfast.     freestyle lancets  Test blood sugar 4 (four) times daily as instructed     gabapentin 300 MG capsule  Commonly known as:  NEURONTIN  Take 300 mg by mouth 3 (three) times daily as needed (pain).     glucose blood test strip  Commonly known as:  FREESTYLE LITE  Use  4x a day. Dx code: 250.60     Insulin Pen Needle 31G X 5 MM Misc  Commonly known as:  B-D UF III MINI PEN NEEDLES  USE ONE  4 TIMES DAILY WITH MEALS AND AT BEDTIME     labetalol 300 MG tablet  Commonly known as:  NORMODYNE  Take 1 tablet (300 mg total) by mouth 2 (two) times daily.     LANTUS SOLOSTAR 100 UNIT/ML Solostar Pen  Generic drug:  Insulin Glargine  Inject 60 Units into the skin at bedtime.     losartan 100 MG tablet  Commonly known as:  COZAAR  Take 1 tablet (100 mg total) by mouth daily.     metFORMIN 500 MG tablet  Commonly known as:  GLUCOPHAGE  Take 1,000 mg by mouth 2 (two) times daily with a meal.     nitroGLYCERIN 0.4 MG SL tablet  Commonly known as:  NITROSTAT  Place 1 tablet (0.4 mg total) under the tongue every 5 (five) minutes x 3 doses as needed for chest pain.     NOVOLOG FLEXPEN 100 UNIT/ML FlexPen  Generic drug:  insulin aspart  Inject 38-42 Units into the skin 3 (three) times daily with meals. 42 units before breakfast, 38 units before lunch and dinner     potassium chloride SA 20 MEQ tablet  Commonly known as:  K-DUR,KLOR-CON  Take 20 mEq by mouth daily.       Follow-up Information   Follow up with Sanda Klein, MD. (Our office will call you for appt)    Specialty:  Cardiology   Contact information:   [address] [place] [address] [phone]         Time spent with patient to include physician time:*40 mintues Signed: Jory Sims 03/27/2013, 8:25 AM Co-Sign MD

## 2013-03-27 NOTE — Progress Notes (Signed)
Patient had 30 second run of [place]. Patient was asleep, but easily aroused. She was asymptomatic. Took vitals and EKG. MD made aware. Will continue to monitor.  Domingo Dimes RN

## 2013-03-27 NOTE — Discharge Summary (Signed)
Please see also my rounding note. 

## 2013-03-27 NOTE — Discharge Instructions (Signed)
Coronary Angiography With Stent, Care After Refer to this sheet in the next few weeks. These instructions provide you with information on caring for yourself after your procedure. Your health care provider may also give you more specific instructions. Your treatment has been planned according to current medical practices, but problems sometimes occur. Call your health care provider if you have any problems or questions after your procedure.  WHAT TO EXPECT AFTER THE PROCEDURE  The insertion site may be tender for a few days after your procedure. HOME CARE INSTRUCTIONS   Only take over-the-counter or prescription medicines as directed by your health care provider. Take all medicines exactly as instructed. Blood thinners may be prescribed after your procedure to improve blood flow through the stent.  Change any bandages (dressings) as directed by your health care provider.   Check your insertion site every day for redness, swelling, or fluid leaking from the insertion.   You may take showers. Pat the insertion area dry. Do not rub the insertion area with a washcloth or towel.   Eat a heart-healthy diet. This should include plenty of fresh fruits and vegetables. Meat should be lean cuts. Avoid the following types of food:   Food that is high in salt.   Canned or highly processed food.   Food that is high in saturated fat or sugar.   Fried food.   Make any other lifestyle changes recommended by your health care provider. This may include:   Quitting smoking.   Managing your weight.   Getting regular exercise.   Managing your blood pressure.   Limiting your alcohol intake.   Managing other health problems, such as diabetes.   If you need an MRI after your heart stent was placed, be sure to tell the health care provider who orders the MRI that you have a heart stent.   Follow up with your health care provider as directed.  SEEK IMMEDIATE MEDICAL CARE IF:   You  develop chest pain, shortness of breath, feel faint, or pass out.  You have bleeding, swelling larger than a walnut, or drainage from the catheter insertion site.  You develop pain, discoloration, coldness, or severe bruising in the leg or arm that held the catheter.  You develop bleeding from any other place such as from the bowels. There may be bright red blood in the urine or stools, or it may appear as black, tarry stools.  You have a fever or chills. MAKE SURE YOU:  Understand these instructions.  Will watch your condition.  Will get help right away if you are not doing well or get worse. Document Released: 09/13/2004 Document Revised: 10/27/2012 Document Reviewed: 07/28/2012 [facility] Patient Information 2014 [place].

## 2013-03-27 NOTE — Progress Notes (Signed)
Discharge instructions given to patient and family.  All questions answered.  Patient transported to car via wheelchair.

## 2013-03-28 ENCOUNTER — Telehealth: Payer: Self-pay | Admitting: Cardiovascular Disease

## 2013-03-28 LAB — GLUCOSE, CAPILLARY: Glucose-Capillary: 70 mg/dL (ref 70–99)

## 2013-03-28 NOTE — Telephone Encounter (Signed)
TCM Patient .Marland KitchenNeeds a phone call on 03/29/2013.Marland Kitchen Patient is Information systems manager.Marland KitchenMarland Kitchen6/01/1961 ... MRN #[id number].. Telephone # is [phone]   Thanks

## 2013-03-29 ENCOUNTER — Telehealth: Payer: Self-pay | Admitting: Cardiology

## 2013-03-29 NOTE — Telephone Encounter (Signed)
TCM call.  Pt denies any chest pain or SOB.  She is taking her meds without problems.  She has follow up appt on the 26th.  She will call if any problems in the meantime.

## 2013-04-04 ENCOUNTER — Ambulatory Visit (INDEPENDENT_AMBULATORY_CARE_PROVIDER_SITE_OTHER): Payer: Medicare Other | Admitting: Cardiology

## 2013-04-04 ENCOUNTER — Encounter: Payer: Self-pay | Admitting: Cardiology

## 2013-04-04 ENCOUNTER — Telehealth: Payer: Self-pay | Admitting: Internal Medicine

## 2013-04-04 VITALS — BP 130/70 | HR 80 | Ht 67.0 in | Wt 224.0 lb

## 2013-04-04 DIAGNOSIS — I2510 Atherosclerotic heart disease of native coronary artery without angina pectoris: Secondary | ICD-10-CM

## 2013-04-04 DIAGNOSIS — I10 Essential (primary) hypertension: Secondary | ICD-10-CM

## 2013-04-04 DIAGNOSIS — I2589 Other forms of chronic ischemic heart disease: Secondary | ICD-10-CM | POA: Diagnosis not present

## 2013-04-04 DIAGNOSIS — I429 Cardiomyopathy, unspecified: Secondary | ICD-10-CM | POA: Diagnosis not present

## 2013-04-04 DIAGNOSIS — I2109 ST elevation (STEMI) myocardial infarction involving other coronary artery of anterior wall: Secondary | ICD-10-CM

## 2013-04-04 DIAGNOSIS — E1149 Type 2 diabetes mellitus with other diabetic neurological complication: Secondary | ICD-10-CM

## 2013-04-04 DIAGNOSIS — I255 Ischemic cardiomyopathy: Secondary | ICD-10-CM

## 2013-04-04 DIAGNOSIS — E785 Hyperlipidemia, unspecified: Secondary | ICD-10-CM

## 2013-04-04 MED ORDER — ATORVASTATIN CALCIUM 80 MG PO TABS: 80.0000 mg | ORAL_TABLET | Freq: Every day | ORAL | Status: DC

## 2013-04-04 MED ORDER — POTASSIUM CHLORIDE CRYS ER 20 MEQ PO TBCR: 20.0000 meq | EXTENDED_RELEASE_TABLET | Freq: Every day | ORAL | Status: DC

## 2013-04-04 NOTE — Telephone Encounter (Signed)
Patient mother came in Lynn Hobbs to drop off some forms to be completed and to let the dr aware of the patient's visit in the ED with chest pain. Anne Fu stated that Lynn Hobbs is unable to drive Lynn Hobbs has a stent that was placed in, currently wearing a heart vest and she has damage on the front of her heart.

## 2013-04-04 NOTE — Assessment & Plan Note (Signed)
S/p PCI + DES to LAD 03/23/13. No further CP. On DAPT with ASA + Plavix. Will check a P2Y12 to check for platelet inhibition. Resume BB and statin. Pt has PRN nitroglycerine. The proper use of NTG and instructions on when to seek medical attention was reviewed.

## 2013-04-04 NOTE — Progress Notes (Signed)
Patient ID: Lynn Hobbs, female   DOB: 03/24/1960, 53 y.o.   MRN: [id number]   04/04/2013 Lynn Hobbs   09/22/1960  [id number]  Primary Physicia Nyoka Cowden, MD Primary Cardiologist: Dr. Claiborne Billings  HPI:  [name] presents to clinic today for follow-up after a recent hospitalization for STEMI.  She is a 53 y/o AAF, with a PMH significant for CVA x 2, DM, HTN and HLD. Her PCP is Dr. Philemon Kingdom. She presented to [facility] on 03/23/13 with chest pain. An EKG demonstrated anterior ST segment elevations and also development of inferior Q waves. CODE STEMI was activated and she was taken urgently to the cath lab. The procedure was performed by Dr. Claiborne Billings. She was found to have a 99% occluded LAD, just beyond the proximal septal and diagonal vessel. The right coronary artery was also severely diffusely diseased with 40% near ostial narrowing, diffuse 70-78% mid stenosis, 80% stenosis before the acute marginal, with 6% distal RCA stenosis. The patient had successful PCI of the LAD with PTCA and stenting using a 3.0x18 mm Xience Alpine stent. She tolerated the procedure well. She was placed on DAPT with ASA + Plavix. Her CP resolved. She was also placed on a BB, ARB and statin. A post-cath 2D echo revealed reduced systolic function with an EF of 30-35%. She was prescribed a LifeVest prior to discharge. She was discharged from [facility] on 03/27/13.  She presents to clinic today w/o complaints. She denies any further episodes of CP or SOB. No palpations, dizziness, syncope/ near syncope. She reports daily compliance with her medications and with her LifeVest. She has had no shocks delivered.    Current Outpatient Prescriptions  Medication Sig Dispense Refill  . acetaminophen (TYLENOL) 650 MG CR tablet Take 1,300 mg by mouth daily as needed for pain.      Marland Kitchen amLODipine (NORVASC) 10 MG tablet Take 10 mg by mouth daily.      Marland Kitchen aspirin EC 81 MG tablet Take 81 mg by mouth daily.      Marland Kitchen atorvastatin  (LIPITOR) 80 MG tablet Take 1 tablet (80 mg total) by mouth at bedtime.  30 tablet  5  . Canagliflozin (INVOKANA) 100 MG TABS Take 1 tablet (100 mg total) by mouth daily.  90 tablet  4  . carvedilol (COREG) 3.125 MG tablet Take 3 tablets (9.375 mg total) by mouth 2 (two) times daily with a meal.  120 tablet  3  . chlorthalidone (HYGROTON) 25 MG tablet Take 25 mg by mouth daily.      . clopidogrel (PLAVIX) 75 MG tablet Take 1 tablet (75 mg total) by mouth daily with breakfast.  30 tablet  6  . gabapentin (NEURONTIN) 300 MG capsule Take 300 mg by mouth 3 (three) times daily as needed (pain).       Marland Kitchen glucose blood (FREESTYLE LITE) test strip Use 4x a day. Dx code: 250.60  200 each  12  . insulin aspart (NOVOLOG FLEXPEN) 100 UNIT/ML FlexPen Inject 38-42 Units into the skin 3 (three) times daily with meals. 42 units before breakfast, 38 units before lunch and dinner      . Insulin Glargine (LANTUS SOLOSTAR) 100 UNIT/ML Solostar Pen Inject 60 Units into the skin at bedtime.      . Insulin Pen Needle (B-D UF III MINI PEN NEEDLES) 31G X 5 MM MISC USE ONE  4 TIMES DAILY WITH MEALS AND AT BEDTIME  100 each  12  . labetalol (NORMODYNE) 300 MG tablet  Take 1 tablet (300 mg total) by mouth 2 (two) times daily.  180 tablet  4  . Lancets (FREESTYLE) lancets Test blood sugar 4 (four) times daily as instructed  150 each  2  . losartan (COZAAR) 100 MG tablet Take 1 tablet (100 mg total) by mouth daily.  90 tablet  1  . metFORMIN (GLUCOPHAGE) 500 MG tablet Take 1,000 mg by mouth 2 (two) times daily with a meal.      . nitroGLYCERIN (NITROSTAT) 0.4 MG SL tablet Place 1 tablet (0.4 mg total) under the tongue every 5 (five) minutes x 3 doses as needed for chest pain.  30 tablet  12  . potassium chloride SA (K-DUR,KLOR-CON) 20 MEQ tablet Take 1 tablet (20 mEq total) by mouth daily.  30 tablet  5   No current facility-administered medications for this visit.    No Known Allergies  History   Social History  . Marital  Status: Single    Spouse Name: N/A    Number of Children: N/A  . Years of Education: N/A   Occupational History  . Not on file.   Social History Main Topics  . Smoking status: Never Smoker   . Smokeless tobacco: Never Used  . Alcohol Use: No  . Drug Use: No  . Sexual Activity: Not on file   Other Topics Concern  . Not on file   Social History Narrative   Regular exercise: no   Caffeine use: ocassionally     Review of Systems: General: negative for chills, fever, night sweats or weight changes.  Cardiovascular: negative for chest pain, dyspnea on exertion, edema, orthopnea, palpitations, paroxysmal nocturnal dyspnea or shortness of breath Dermatological: negative for rash Respiratory: negative for cough or wheezing Urologic: negative for hematuria Abdominal: negative for nausea, vomiting, diarrhea, bright red blood per rectum, melena, or hematemesis Neurologic: negative for visual changes, syncope, or dizziness All other systems reviewed and are otherwise negative except as noted above.    Blood pressure 130/70, pulse 80, height 5\' 7"  (1.702 m), weight 224 lb (101.606 kg).  General appearance: alert, cooperative and no distress Neck: no carotid bruit and no JVD Lungs: clear to auscultation bilaterally Heart: regular rate and rhythm, S1, S2 normal, no murmur, click, rub or gallop Extremities: no LEE Pulses: 2+ and symmetric Skin: warm and dry Neurologic: Grossly normal   ASSESSMENT AND PLAN:   ST elevation myocardial infarction (STEMI) of anterior wall S/p PCI + DES to LAD 03/23/13. No further CP. On DAPT with ASA + Plavix. Will check a P2Y12 to check for platelet inhibition. Resume BB and statin. Pt has PRN nitroglycerine. The proper use of NTG and instructions on when to seek medical attention was reviewed.   Secondary cardiomyopathy, unspecified Post STEMI 2D echo revealed an EF of 30-35%. Pt has a LifeVest. Will repeat 2D echo in 3 months time to see if an ICD is  warranted. Will continue BB and ARB for reduced systolic function. Also on a thiazide diuretic with supplemental potassium. Pt educated on the signs/ symptoms of acute heart failure exacerbation. Pt advised to follow a low sodium diet. Pt instructed to notify our office if she demonstrates any signs of fluid retention or SOB. We may consider switching to Lasix if thiazide is insufficient   Diabetes with neurologic complications PCP managing. Last Hgb A1c was 8.1. Discussed the importance of good glycemic control for cardiovascular health. Informed patient that her Hgb A1c goal should be less than 7.0.  Encouraged better  diet and exercise.   HYPERLIPIDEMIA Poorly controlled. Lipid Panel during admission revealed an LDL of 184. Resume Lipitor 80 mg. Goal LDH should be at least <100, although <70 would be more ideal.   Essential hypertension Well controlled in clinic today at 130/70. Resume thiazide diuretic, BB and ARB.     PLAN  Ms. Newport presents for hospital f/u following an acute anterior STEMI, treated with PCI + DES to LAD on 03/23/13. She has had no issues / further chest pain since discharge. She has been wearing a LifeVest, in the setting of reduced systolic function, for prevention of SCD. Her EF is 30-35%. She is on the appropriate medications, including DAPT w/ ASA + Plavix, Coreg, losartan, Lipitor and a thiazide diuretic. I have ordered for her to get a P2Y12 to assess platelet inhibition, in the setting of Plavix therapy. She will follow up with Dr. Claiborne Billings in 4-6 weeks for medication titration. She will undergo a repeat 2D echo in 3 months to reassess systolic function, to see if an ICD is warranted.   Khole Branch, BRITTAINYPA-C 04/04/2013 9:05 AM

## 2013-04-04 NOTE — Assessment & Plan Note (Signed)
Well controlled in clinic today at 130/70. Resume thiazide diuretic, BB and ARB.

## 2013-04-04 NOTE — Patient Instructions (Signed)
Continue taking medications as directed. Work on better diet and exercise. Work on achieving Hgb A1c goal of <7.0. Go to ER if you have chest pain that does not improve with nitroglycerine. Follow-up with Dr. Claiborne Billings in 4-6 weeks. You will need an ultrasound in 3 months. No driving until directed.

## 2013-04-04 NOTE — Assessment & Plan Note (Signed)
Post STEMI 2D echo revealed an EF of 30-35%. Pt has a LifeVest. Will repeat 2D echo in 3 months time to see if an ICD is warranted. Will continue BB and ARB for reduced systolic function. Also on a thiazide diuretic with supplemental potassium. Pt educated on the signs/ symptoms of acute heart failure exacerbation. Pt advised to follow a low sodium diet. Pt instructed to notify our office if she demonstrates any signs of fluid retention or SOB. We may consider switching to Lasix if thiazide is insufficient

## 2013-04-04 NOTE — Assessment & Plan Note (Addendum)
Poorly controlled. Lipid Panel during admission revealed an LDL of 184. Resume Lipitor 80 mg. Goal LDH should be at least <100, although <70 would be more ideal.

## 2013-04-04 NOTE — Telephone Encounter (Signed)
FYI

## 2013-04-04 NOTE — Assessment & Plan Note (Signed)
PCP managing. Last Hgb A1c was 8.1. Discussed the importance of good glycemic control for cardiovascular health. Informed patient that her Hgb A1c goal should be less than 7.0.  Encouraged better diet and exercise.

## 2013-04-05 LAB — PLATELET INHIBITION P2Y12: Platelet Function  P2Y12: 159 [PRU] — ABNORMAL LOW (ref 194–418)

## 2013-04-06 ENCOUNTER — Telehealth: Payer: Self-pay

## 2013-04-06 NOTE — Telephone Encounter (Signed)
Relevant patient education assigned to patient using Emmi. ° °

## 2013-04-12 ENCOUNTER — Encounter: Payer: Self-pay | Admitting: Cardiology

## 2013-04-12 ENCOUNTER — Telehealth: Payer: Self-pay | Admitting: Cardiology

## 2013-04-12 NOTE — Telephone Encounter (Signed)
Letter created. In patient's chart.

## 2013-04-12 NOTE — Telephone Encounter (Signed)
Need a short letter stating that she had a heart attack. This letter is so they have can their mail deliver on her porch.This Letter to the [place] and they can it a hardship delivery.Please mail this to pt and she will get it where it belongs.

## 2013-04-12 NOTE — Telephone Encounter (Signed)
Returned call and pt verified x 2.  Pt asked RN to speak w/ Lynn Hobbs, her mother.  Mother stated pt had a heart attack and she has had angina (not our pt) and they cannot get to the mailbox.  Stated the landlord put the mailbox on the porch, but the post office said they cannot deliver their mail until they have a letter why and they can do a hardship delivery.  Stated the letter needs to be addressed to the [place].  Mother stated pt was told to do little activity and not to drive until she is told to.  RN reviewed last OV note and pt was instructed not to drive until directed and was also advised to do better w/ diet and exercise.  Mother informed Silas Flood, PA-C will be notified of request and she will be notified once response given.  Verbalized understanding and agreed w/ plan.  Message forwarded to [employer], PA-C.

## 2013-04-13 ENCOUNTER — Telehealth: Payer: Self-pay | Admitting: Internal Medicine

## 2013-04-13 ENCOUNTER — Telehealth: Payer: Self-pay | Admitting: *Deleted

## 2013-04-13 NOTE — Telephone Encounter (Signed)
Returned call and spoke w/ Lelon Frohlich, pt's mother.  Informed letter will be ready tomorrow.  Mother asked that letter be mailed to them b/c they have to pay for transportation every time they go out.  Informed it will be mailed once signed tomorrow.  Verbalized understanding and agreed w/ plan.

## 2013-04-13 NOTE — Telephone Encounter (Signed)
Relevant patient education assigned to patient using Emmi. ° °

## 2013-04-18 ENCOUNTER — Telehealth: Payer: Self-pay | Admitting: *Deleted

## 2013-04-18 NOTE — Telephone Encounter (Signed)
Informed patient's mother the letter that was requested is ready for pick up and will be left at the front desk reception area.

## 2013-04-18 NOTE — Telephone Encounter (Signed)
Pt's mother Corita Allinson called in regards to having a letter mailed to her so that she can give it to the post master so that the mail carrier can leave their mail on the front porch and not in the mailbox because they can not go to the mailbox to get it.  TK

## 2013-05-05 ENCOUNTER — Ambulatory Visit: Payer: Medicare Other | Admitting: Internal Medicine

## 2013-05-17 ENCOUNTER — Other Ambulatory Visit: Payer: Self-pay | Admitting: *Deleted

## 2013-05-17 ENCOUNTER — Encounter: Payer: Self-pay | Admitting: Cardiovascular Disease

## 2013-05-17 ENCOUNTER — Encounter: Payer: Self-pay | Admitting: *Deleted

## 2013-05-17 ENCOUNTER — Ambulatory Visit (INDEPENDENT_AMBULATORY_CARE_PROVIDER_SITE_OTHER): Payer: Medicare Other | Admitting: Cardiovascular Disease

## 2013-05-17 VITALS — BP 148/82 | HR 86 | Ht 67.0 in | Wt 225.9 lb

## 2013-05-17 DIAGNOSIS — I2109 ST elevation (STEMI) myocardial infarction involving other coronary artery of anterior wall: Secondary | ICD-10-CM

## 2013-05-17 DIAGNOSIS — E782 Mixed hyperlipidemia: Secondary | ICD-10-CM

## 2013-05-17 DIAGNOSIS — I2510 Atherosclerotic heart disease of native coronary artery without angina pectoris: Secondary | ICD-10-CM | POA: Diagnosis not present

## 2013-05-17 DIAGNOSIS — I429 Cardiomyopathy, unspecified: Secondary | ICD-10-CM | POA: Diagnosis not present

## 2013-05-17 DIAGNOSIS — R5383 Other fatigue: Secondary | ICD-10-CM

## 2013-05-17 DIAGNOSIS — E785 Hyperlipidemia, unspecified: Secondary | ICD-10-CM | POA: Diagnosis not present

## 2013-05-17 DIAGNOSIS — I10 Essential (primary) hypertension: Secondary | ICD-10-CM | POA: Diagnosis not present

## 2013-05-17 DIAGNOSIS — R5381 Other malaise: Secondary | ICD-10-CM

## 2013-05-17 DIAGNOSIS — Z79899 Other long term (current) drug therapy: Secondary | ICD-10-CM

## 2013-05-17 DIAGNOSIS — E1149 Type 2 diabetes mellitus with other diabetic neurological complication: Secondary | ICD-10-CM

## 2013-05-17 DIAGNOSIS — E119 Type 2 diabetes mellitus without complications: Secondary | ICD-10-CM

## 2013-05-17 MED ORDER — CARVEDILOL 25 MG PO TABS: 25.0000 mg | ORAL_TABLET | Freq: Two times a day (BID) | ORAL | Status: DC

## 2013-05-17 NOTE — Patient Instructions (Signed)
Your physician recommends that you schedule a follow-up appointment follow up after Echo in April   Your physician has recommended you make the following change in your medication: Stop the labetalol, Take Carvedilol 12.5 mg twice daily for two weeks, then take 25 mg twice daily.

## 2013-05-17 NOTE — Progress Notes (Signed)
Patient ID: Lynn Hobbs, female   DOB: 1961-02-06, 53 y.o.   MRN: [id number]     HPI: Lynn Hobbs is a 53 y.o. female office for cardiology evaluation following her recent ST segment elevation myocardial infarction.  Lynn Hobbs is a [age] year old African American female who has a history of diabetes mellitus with neurologic complications, who has a history of prior CVA, hyperlipidemia, and peripheral neuropathy. She developed chest pain leading to her hospitalization on 03/23/2013 subsequent ECG after arrival to the hospital in the early morning showed ST elevation anteriorly. Troponin was positive for 0.86 and urgent catheterization was performed by me. Catheterization demonstrated a 99% stenosis in the LAD on the proximal supple diagonal vessel with initial TIMI one half low. She a diffusely diseased distal LAD with 50-60% stenosis, and she also had 40-50% narrowings in the first diagonal vessel. The circumflex vessel had mild irregularities. The RCA was diffusely diseased with 40% near ostial narrowing, diffuse 70-80% mid stenoses, 80% stenosis before the acute margin, and 60% distal stenosis. She was discharged on 03/27/2013. She was sent home with a life vest. She saw Boyce MediciBrittany Simmons in the office for her initial followup evaluation on 04/04/2013. She now presents for evaluation.  Of note, during the hospital her LDL cholesterol was 184. Hemoglobin A1c was 8.1. A 2-D echo Doppler study pre-discharge following her initial event showed an ejection fraction of 30-35% and for this reason a life vest was initially recommended with plans for a followup echo in 3 months.  She denies recurrent chest tightness. Review of her medication records currently indicates that she has been taking both labetalol as well as carvedilol she denies palpitations. She denies presyncope or syncope. She denies light vest discharge.  Past Medical History  Diagnosis Date  . BENIGN NEOPLASM OF SKIN SITE UNSPECIFIED  09/08/2008  . CEREBROVASCULAR DISEASE 12/11/2008  . DIABETES MELLITUS, TYPE II 10/12/2006  . HYPERLIPIDEMIA 10/12/2006  . HYPERTENSION 10/12/2006  . PERIPHERAL NEUROPATHY 10/21/2006  . Endometriosis   . Fibroids     Past Surgical History  Procedure Laterality Date  . Abdominal hysterectomy      No Known Allergies  Current Outpatient Prescriptions  Medication Sig Dispense Refill  . acetaminophen (TYLENOL) 650 MG CR tablet Take 1,300 mg by mouth daily as needed for pain.      Marland Kitchen. amLODipine (NORVASC) 10 MG tablet Take 10 mg by mouth daily.      Marland Kitchen. aspirin EC 81 MG tablet Take 81 mg by mouth daily.      Marland Kitchen. atorvastatin (LIPITOR) 80 MG tablet Take 1 tablet (80 mg total) by mouth at bedtime.  30 tablet  5  . Canagliflozin (INVOKANA) 100 MG TABS Take 1 tablet (100 mg total) by mouth daily.  90 tablet  4  . chlorthalidone (HYGROTON) 25 MG tablet Take 25 mg by mouth daily.      . clopidogrel (PLAVIX) 75 MG tablet Take 1 tablet (75 mg total) by mouth daily with breakfast.  30 tablet  6  . gabapentin (NEURONTIN) 300 MG capsule Take 300 mg by mouth 3 (three) times daily as needed (pain).       . insulin aspart (NOVOLOG FLEXPEN) 100 UNIT/ML FlexPen Inject 38-42 Units into the skin 3 (three) times daily with meals. 42 units before breakfast, 38 units before lunch and dinner      . Insulin Glargine (LANTUS SOLOSTAR) 100 UNIT/ML Solostar Pen Inject 60 Units into the skin at bedtime.      .Marland Kitchen  losartan (COZAAR) 100 MG tablet Take 1 tablet (100 mg total) by mouth daily.  90 tablet  1  . metFORMIN (GLUCOPHAGE) 500 MG tablet Take 1,000 mg by mouth 2 (two) times daily with a meal.      . nitroGLYCERIN (NITROSTAT) 0.4 MG SL tablet Place 1 tablet (0.4 mg total) under the tongue every 5 (five) minutes x 3 doses as needed for chest pain.  30 tablet  12  . potassium chloride SA (K-DUR,KLOR-CON) 20 MEQ tablet Take 1 tablet (20 mEq total) by mouth daily.  30 tablet  5  . carvedilol (COREG) 25 MG tablet Take 1 tablet (25 mg  total) by mouth 2 (two) times daily.  60 tablet  6   No current facility-administered medications for this visit.    History   Social History  . Marital Status: Single    Spouse Name: N/A    Number of Children: N/A  . Years of Education: N/A   Occupational History  . Not on file.   Social History Main Topics  . Smoking status: Never Smoker   . Smokeless tobacco: Never Used  . Alcohol Use: No  . Drug Use: No  . Sexual Activity: Not on file   Other Topics Concern  . Not on file   Social History Narrative   Regular exercise: no   Caffeine use: ocassionally    History reviewed. No pertinent family history.  ROS is negative for fevers, chills or night sweats. She denies change in weight. She denies change in vision or hearing. She denies cough or increased sputum production. She is unaware lymphadenopathy. There is no presyncope or syncope. She denies palpitations. He denies recurrent chest tightness. There is no wheezing. She denies nausea vomiting or diarrhea. She is unaware of blood in stool or urine. She denies claudication. He denies myalgias. At times there is some mild leg swelling. 7 diabetes. There is no history of thyroid abnormalities. She is unaware of sleep disturbance. Other comprehensive 14 point system review is negative.  PE BP 148/82  Pulse 86  Ht 5\' 7"  (1.702 m)  Wt 225 lb 14.4 oz (102.468 kg)  BMI 35.37 kg/m2  General: Alert, oriented, no distress.  Skin: normal turgor, no rashes HEENT: Normocephalic, atraumatic. Pupils round and reactive; sclera anicteric;no lid lag. Extraocular muscles intact;; no xanthelasmas. Nose without nasal septal hypertrophy Mouth/Parynx benign; Mallinpatti scale 3 Neck: No JVD, no carotid bruits; normal carotid upstroke Lungs: clear to ausculatation and percussion; no wheezing or rales Chest wall: no tenderness to palpitation Heart: RRR, s1 s2 normal; 1/6 systolic murmur no diastolic murmur, rub thrills or heaves Abdomen:  soft, nontender; no hepatosplenomehaly, BS+; abdominal aorta nontender and not dilated by palpation. Back: no CVA tenderness Pulses 2+ Extremities: no clubbing cyanosis or edema, Homan's sign negative  Neurologic: grossly nonfocal; cranial nerves grossly normal. Psychologic: normal affect and mood.  ECG (independently read by me): Normal sinus rhythm at 86 beats per minute anterior Q waves with her MI. QTc interval 459 ms   LABS:  BMET    Component Value Date/Time   NA 140 03/27/2013 0359   K 3.1* 03/27/2013 0359   CL 98 03/27/2013 0359   CO2 26 03/27/2013 0359   GLUCOSE 87 03/27/2013 0359   GLUCOSE 112* 02/13/2006 1112   BUN 24* 03/27/2013 0359   CREATININE 1.06 03/27/2013 0359   CALCIUM 9.2 03/27/2013 0359   GFRNONAA 59* 03/27/2013 0359   GFRAA 69* 03/27/2013 0359     Hepatic Function  Panel     Component Value Date/Time   PROT 8.7* 03/23/2013 2150   ALBUMIN 4.3 03/23/2013 2150   AST 18 03/23/2013 2150   ALT 13 03/23/2013 2150   ALKPHOS 61 03/23/2013 2150   BILITOT <0.2* 03/23/2013 2150   BILIDIR 0.0 07/02/2010 1121     CBC    Component Value Date/Time   WBC 9.6 03/27/2013 0359   RBC 4.45 03/27/2013 0359   HGB 10.9* 03/27/2013 0359   HCT 33.5* 03/27/2013 0359   PLT 207 03/27/2013 0359   MCV 75.3* 03/27/2013 0359   MCH 24.5* 03/27/2013 0359   MCHC 32.5 03/27/2013 0359   RDW 16.2* 03/27/2013 0359   LYMPHSABS 2.5 03/23/2013 2150   MONOABS 0.9 03/23/2013 2150   EOSABS 0.1 03/23/2013 2150   BASOSABS 0.0 03/23/2013 2150     BNP No results found for this basename: probnp    Lipid Panel     Component Value Date/Time   CHOL 259* 03/25/2013 0321   TRIG 93 03/25/2013 0321   HDL 56 03/25/2013 0321   CHOLHDL 4.6 03/25/2013 0321   VLDL 19 03/25/2013 0321   LDLCALC 184* 03/25/2013 0321     RADIOLOGY: No results found.    ASSESSMENT AND PLAN: Lynn Hobbs suffered an ST segment elevation myocardial infarction and presented urgently to the catheterization laboratory in the morning  of 03/24/2013. She was found to have subtotal 99% LAD stenosis with TIMI one half flow initially. She now has been on a medical regimen and hopefully there will be some recovery of LV function. When I review her medical medications, apparently she has been taking labetalol 300 mg twice a day in addition to carvedilol 9.375 mg in the morning and 6.25 mg at night. I have recommended she discontinue her labetalol but will titrate her carvedilol to 12.5 mg twice a day for 2 weeks and then she will increase this to 25 mg twice a day. Presently, she's not having any signs of edema on her chlorthalidone. Her blood pressure today was 003-704 systolically and she is on losartan 100 mg in addition to amlodipine 10 mg and her beta blocker and diuretic regimen.. She now is on atorvastatin 80 mg for significant hyperlipidemia. She is tolerating aspirin and Plavix for dual antiplatelet therapy without bleeding. She is scheduled for a three-month followup echo Doppler study on 06/14/2013. I will see her back in the office one week later. Prior to that office visit repeat laboratory obtained consisting of a CMP, lipid panel, thyroid function studies, and hemoglobin A1c.     Troy Sine, MD, [facility]  05/17/2013 10:49 AM

## 2013-05-20 ENCOUNTER — Telehealth: Payer: Self-pay | Admitting: Cardiovascular Disease

## 2013-05-20 NOTE — Telephone Encounter (Signed)
She wants to know if she can go and get her lab [place]?

## 2013-05-20 NOTE — Telephone Encounter (Signed)
Returned call and pt verified x 2.  Pt informed message received and she can go to any [facility] lab to have labs drawn before her next appt.  Pt verbalized understanding and agreed w/ plan.

## 2013-05-25 DIAGNOSIS — R5381 Other malaise: Secondary | ICD-10-CM | POA: Diagnosis not present

## 2013-05-25 DIAGNOSIS — E119 Type 2 diabetes mellitus without complications: Secondary | ICD-10-CM | POA: Diagnosis not present

## 2013-05-25 DIAGNOSIS — Z79899 Other long term (current) drug therapy: Secondary | ICD-10-CM | POA: Diagnosis not present

## 2013-05-25 DIAGNOSIS — E782 Mixed hyperlipidemia: Secondary | ICD-10-CM | POA: Diagnosis not present

## 2013-05-25 LAB — CBC
HCT: 35.6 % — ABNORMAL LOW (ref 36.0–46.0)
Hemoglobin: 11.9 g/dL — ABNORMAL LOW (ref 12.0–15.0)
MCH: 24.4 pg — AB (ref 26.0–34.0)
MCHC: 33.4 g/dL (ref 30.0–36.0)
MCV: 73 fL — ABNORMAL LOW (ref 78.0–100.0)
PLATELETS: 222 10*3/uL (ref 150–400)
RBC: 4.88 MIL/uL (ref 3.87–5.11)
RDW: 16.5 % — ABNORMAL HIGH (ref 11.5–15.5)
WBC: 7.2 10*3/uL (ref 4.0–10.5)

## 2013-05-25 LAB — COMPREHENSIVE METABOLIC PANEL
ALBUMIN: 4.5 g/dL (ref 3.5–5.2)
ALT: 12 U/L (ref 0–35)
AST: 16 U/L (ref 0–37)
Alkaline Phosphatase: 57 U/L (ref 39–117)
BUN: 22 mg/dL (ref 6–23)
CO2: 32 meq/L (ref 19–32)
Calcium: 9.6 mg/dL (ref 8.4–10.5)
Chloride: 97 mEq/L (ref 96–112)
Creat: 1.23 mg/dL — ABNORMAL HIGH (ref 0.50–1.10)
GLUCOSE: 166 mg/dL — AB (ref 70–99)
Potassium: 4.4 mEq/L (ref 3.5–5.3)
Sodium: 138 mEq/L (ref 135–145)
Total Bilirubin: 0.5 mg/dL (ref 0.2–1.2)
Total Protein: 7.8 g/dL (ref 6.0–8.3)

## 2013-05-25 LAB — LIPID PANEL
Cholesterol: 150 mg/dL (ref 0–200)
HDL: 50 mg/dL (ref >39)
LDL Cholesterol: 86 mg/dL (ref 0–99)
Total CHOL/HDL Ratio: 3 Ratio
Triglycerides: 68 mg/dL (ref <150)
VLDL: 14 mg/dL (ref 0–40)

## 2013-05-25 LAB — TSH: TSH: 1.4 u[IU]/mL (ref 0.350–4.500)

## 2013-05-25 LAB — HEMOGLOBIN A1C
HEMOGLOBIN A1C: 8 % — AB (ref <5.7)
Mean Plasma Glucose: 183 mg/dL — ABNORMAL HIGH (ref <117)

## 2013-05-30 ENCOUNTER — Ambulatory Visit (INDEPENDENT_AMBULATORY_CARE_PROVIDER_SITE_OTHER): Payer: Medicare Other | Admitting: Internal Medicine

## 2013-05-30 ENCOUNTER — Encounter: Payer: Self-pay | Admitting: Internal Medicine

## 2013-05-30 VITALS — BP 130/80 | HR 81 | Temp 98.4°F | Resp 12 | Wt 221.0 lb

## 2013-05-30 DIAGNOSIS — I2510 Atherosclerotic heart disease of native coronary artery without angina pectoris: Secondary | ICD-10-CM | POA: Diagnosis not present

## 2013-05-30 DIAGNOSIS — E1149 Type 2 diabetes mellitus with other diabetic neurological complication: Secondary | ICD-10-CM

## 2013-05-30 NOTE — Patient Instructions (Signed)
-   Please continue Metformin 1000 mg 2x a day - Decrease Lantus to 40 units at night - Decrease Novolog to 35 in am and 30 before dinner - Invokana 100 mg daily   Please return in 3 months with your sugar log.

## 2013-05-30 NOTE — Progress Notes (Signed)
Patient ID: Lynn Hobbs, female   DOB: 06/29/1960, 53 y.o.   MRN: [id number]  HPI: Lynn Hobbs is a 53 y.o.-year-old female, returning for f/u of DM2, dx 2004, insulin-dependent, uncontrolled, with complications (?peripheral neuropathy, cerebrovascular ds, DR). She is here with her mother, who is also diabetic, and offers part of the history. Last visit 4 mo ago.  She had a STEMI  >> midLAD stent placed. She was started on Plavix and beta blocker. She feels well, but has a life vest and a defibrillator >> will have 2DEcho next month. Cardiologist: Dr Claiborne Billings.   Last hemoglobin A1c was: Lab Results  Component Value Date   HGBA1C 8.0* 05/25/2013   HGBA1C 8.8* 03/08/2013   HGBA1C 10.7* 12/02/2012   Pt is on a regimen of: - Metformin XR (Glumetza) 1000 mg bid - Lantus 60 << 80 units qhs - not missing doses - pen - Novolog 42 in am and 38 before dinner - not missing doses - pen - Invokana 100 mg daily (not 300 mg due to urinary frequency)  Pt checks her sugars 2-3x a day and they are improved: - am: 102-145 >> 64-146 (one 160) >> 57-128 (most 90-100) - 2h after b'fast: 91-200 (130-150) >> 101, 95 - before lunch: 43-140 >> n/c - before dinner: 139, 140, 229 ( 229 x 1, after a snack) >> 110, 128 - 2h after dinner: 96, 108 >> 140 - bedtime: 102, 113, last night 47 >> 49, 60, 62, 77, 101 - nighttime: 54 x 1 Lowest sugar was 49; she has hypoglycemia awareness at 60. Highest sugar was 128 in last month..  Pt improved her diet >> lost weight.   - mild CKD, last BUN/creatinine:  Lab Results  Component Value Date   BUN 22 05/25/2013   CREATININE 1.23* 05/25/2013  Last ACR high (12/02/2012): 53.8, previously 64.6. She is on Losartan (changed from Olmesartan). - last set of lipids: Lab Results  Component Value Date   CHOL 150 05/25/2013   HDL 50 05/25/2013   LDLCALC 86 05/25/2013   LDLDIRECT 214.4 12/02/2012   TRIG 68 05/25/2013   CHOLHDL 3.0 05/25/2013  she is on  Atorvastatin 80. - last  eye exam was in 12/20/2012 - Dr. Tempie Hoist. 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  - no numbness and tingling in her feet. She is on aspirin 81 mg.  She has a history of HTN, HL.   ROS: Constitutional: + weight loss, no fatigue, no subjective hyperthermia/hypothermia, + nocturia Eyes: no blurry vision, no xerophthalmia ENT: no sore throat, no nodules palpated in throat, no dysphagia/odynophagia, no hoarseness Cardiovascular: no CP/SOB/palpitations/leg swelling Respiratory: no cough/SOB Gastrointestinal: no N/V/D/C Musculoskeletal: no muscle/joint aches Skin: no rashes  I reviewed pt's medications, allergies, PMH, social hx, family hx and no changes required, except as mentioned above.  PE: BP 130/80  Pulse 81  Temp(Src) 98.4 F (36.9 C) (Oral)  Resp 12  Wt 221 lb (100.245 kg)  SpO2 98% Wt Readings from Last 3 Encounters:  05/30/13 221 lb (100.245 kg)  05/17/13 225 lb 14.4 oz (102.468 kg)  04/04/13 224 lb (101.606 kg)   Constitutional: overweight, in NAD Eyes: PERRLA, EOMI, no exophthalmos ENT: moist mucous membranes, no thyromegaly, no cervical lymphadenopathy Cardiovascular: RRR, No MRG Respiratory: CTA B Gastrointestinal: abdomen soft, NT, ND, BS+ Musculoskeletal: left arm and leg paresis Skin: moist, warm, no rashes Neurological: no tremor with outstretched hands, DTR normal  in all 4  ASSESSMENT: 1. DM2, insulin-dependent, uncontrolled, with complications - ? PN - on Neurontin - cerebrovascular ds. 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 Patient with long-standing, recently more uncontrolled diabetes, but with improved control since we adjusted the regimen at last visit and she started to improve her diet, and especially after her stent >> she started to have low CBGs. - I suggested to:    Patient Instructions  - Please continue Metformin 1000 mg 2x a day - Decrease Lantus  to 40 units at night - Decrease Novolog to 35 in am and 30 before dinner - Invokana 100 mg daily  Please return in 3 months with your sugar log.  - continue checking her sugars at different times of the day - check 3 times a day, rotating checks - given new sugar logs - advised for yearly eye exams, she is up to date  - will check HbA1c at next visit - Return to clinic in 3 months with sugar log - advised to let me know if sugars <80 or >180.

## 2013-06-14 ENCOUNTER — Ambulatory Visit (HOSPITAL_COMMUNITY)
Admission: RE | Admit: 2013-06-14 | Discharge: 2013-06-14 | Disposition: A | Discharge status: Home / Self Care | Payer: Medicare Other | Source: Ambulatory Visit | Attending: Internal Medicine | Admitting: Internal Medicine

## 2013-06-14 DIAGNOSIS — I517 Cardiomegaly: Secondary | ICD-10-CM | POA: Diagnosis not present

## 2013-06-14 DIAGNOSIS — I2589 Other forms of chronic ischemic heart disease: Secondary | ICD-10-CM | POA: Insufficient documentation

## 2013-06-14 DIAGNOSIS — I255 Ischemic cardiomyopathy: Secondary | ICD-10-CM

## 2013-06-14 NOTE — Progress Notes (Signed)
2D Echo Performed 06/14/2013    Jefte Carithers, RCS  

## 2013-06-23 ENCOUNTER — Other Ambulatory Visit: Payer: Self-pay | Admitting: Internal Medicine

## 2013-06-24 ENCOUNTER — Other Ambulatory Visit: Payer: Self-pay | Admitting: Internal Medicine

## 2013-06-27 ENCOUNTER — Telehealth: Payer: Self-pay | Admitting: *Deleted

## 2013-06-27 ENCOUNTER — Ambulatory Visit (INDEPENDENT_AMBULATORY_CARE_PROVIDER_SITE_OTHER): Payer: Medicare Other | Admitting: Ophthalmology

## 2013-06-27 DIAGNOSIS — I10 Essential (primary) hypertension: Secondary | ICD-10-CM | POA: Diagnosis not present

## 2013-06-27 DIAGNOSIS — H2510 Age-related nuclear cataract, unspecified eye: Secondary | ICD-10-CM

## 2013-06-27 DIAGNOSIS — E1165 Type 2 diabetes mellitus with hyperglycemia: Secondary | ICD-10-CM | POA: Diagnosis not present

## 2013-06-27 DIAGNOSIS — E11359 Type 2 diabetes mellitus with proliferative diabetic retinopathy without macular edema: Secondary | ICD-10-CM

## 2013-06-27 DIAGNOSIS — H35039 Hypertensive retinopathy, unspecified eye: Secondary | ICD-10-CM | POA: Diagnosis not present

## 2013-06-27 DIAGNOSIS — E1139 Type 2 diabetes mellitus with other diabetic ophthalmic complication: Secondary | ICD-10-CM

## 2013-06-27 NOTE — Telephone Encounter (Signed)
Left message on voicemail to call office. Pt needs to schedule follow up per Dr. Raliegh Ip.

## 2013-06-27 NOTE — Telephone Encounter (Signed)
Message copied by Marian Sorrow on Mon Jun 27, 2013  8:23 AM ------      Message from: Marletta Lor      Created: Fri Jun 24, 2013  5:23 PM       Please call patient to schedule office followup for further management.of her diabetes      ----- Message -----         From: Lauralee Evener, CMA         Sent: 06/24/2013   4:08 PM           To: Marletta Lor, MD            Dr. Claiborne Billings would like for you to review patient's abnormal blood sugar and CBC. He ordered these labs as routine for her cardiology appointment.            Thanks,             Bary Leriche, CMA; AAMA       ------

## 2013-06-27 NOTE — Telephone Encounter (Signed)
Spoke to pt told her Dr.K would like to see her for a follow up appointment this week or next if she would call back and schedule. Pt verbalized understanding.

## 2013-06-30 ENCOUNTER — Ambulatory Visit (INDEPENDENT_AMBULATORY_CARE_PROVIDER_SITE_OTHER): Payer: Medicare Other | Admitting: Cardiovascular Disease

## 2013-06-30 ENCOUNTER — Encounter: Payer: Self-pay | Admitting: Cardiovascular Disease

## 2013-06-30 VITALS — BP 162/90 | HR 79 | Ht 66.0 in | Wt 224.8 lb

## 2013-06-30 DIAGNOSIS — I10 Essential (primary) hypertension: Secondary | ICD-10-CM | POA: Diagnosis not present

## 2013-06-30 DIAGNOSIS — E785 Hyperlipidemia, unspecified: Secondary | ICD-10-CM

## 2013-06-30 DIAGNOSIS — I2510 Atherosclerotic heart disease of native coronary artery without angina pectoris: Secondary | ICD-10-CM | POA: Diagnosis not present

## 2013-06-30 DIAGNOSIS — I2109 ST elevation (STEMI) myocardial infarction involving other coronary artery of anterior wall: Secondary | ICD-10-CM | POA: Diagnosis not present

## 2013-06-30 DIAGNOSIS — I429 Cardiomyopathy, unspecified: Secondary | ICD-10-CM

## 2013-06-30 DIAGNOSIS — R079 Chest pain, unspecified: Secondary | ICD-10-CM

## 2013-06-30 DIAGNOSIS — E1149 Type 2 diabetes mellitus with other diabetic neurological complication: Secondary | ICD-10-CM

## 2013-06-30 MED ORDER — CARVEDILOL 25 MG PO TABS: ORAL_TABLET | ORAL | Status: DC

## 2013-06-30 NOTE — Progress Notes (Signed)
Patient ID: Lynn Hobbs, female   DOB: 01/22/1961, 53 y.o.   MRN: [id number]     HPI: Lynn Hobbs is a 53 y.o. female office for cardiology evaluation following her ST segment elevation myocardial infarction and recent echo Doppler study.  Lynn Hobbs is a 53 year old African American female who has a history of diabetes mellitus with neurologic complications, who has a history of prior CVA, hyperlipidemia, and peripheral neuropathy. She developed chest pain leading to her hospitalization on 03/23/2013.  Subsequent ECG after arrival to the hospital in the early morning showed ST elevation anteriorly. Troponin was positive for 0.86 and urgent catheterization was performed by me. Catheterization demonstrated a 99% stenosis in the LAD on the proximal supple diagonal vessel with initial TIMI one half low. She a diffusely diseased distal LAD with 50-60% stenosis, and she also had 40-50% narrowings in the first diagonal vessel. The circumflex vessel had mild irregularities. The RCA was diffusely diseased with 40% near ostial narrowing, diffuse 70-80% mid stenoses, 80% stenosis before the acute margin, and 60% distal stenosis. She was discharged on 03/27/2013. She was sent home with a life vest. She saw Ellen Henri in the office for her initial followup evaluation on 04/04/2013 and I saw her one month ago for my initial post hospital evaluation.  Of note, during the hospital her LDL cholesterol was 184. Hemoglobin A1c was 8.1. A 2-D echo Doppler study pre-discharge following her initial event showed an ejection fraction of 30-35% and for this reason a life vest was initially recommended with plans for a followup echo in 3 months.  I last saw her review of her medication records currently indicates that she has been taking both labetalol as well as carvedilol.  That time, I did recommend that she discontinue her labetalol and titrated her carvedilol to 25 mg twice a day.  She denies any palpitations on  this therapy.  She has been wearing a life facet hospital discharge where her ejection fraction initially was in the region of 35%.  An echo Doppler study was just completed on April 7.  This now showed improved LV function with ejection fraction increasing to 45-50%.  She did have left ventricular hypertrophy.  There was akinesis of the apical myocardium.  She did have followup blood work on May 25, 2013.  This showed markedly improved.  Lipid status on her atorvastatin 80 mg with her total cholesterol improving from 259 to150 and your LDL cholesterol from 184 to 86.  HDL was 50, triglycerides 68.  She presents for evaluation.  Past Medical History  Diagnosis Date  . BENIGN NEOPLASM OF SKIN SITE UNSPECIFIED 09/08/2008  . CEREBROVASCULAR DISEASE 12/11/2008  . DIABETES MELLITUS, TYPE II 10/12/2006  . HYPERLIPIDEMIA 10/12/2006  . HYPERTENSION 10/12/2006  . PERIPHERAL NEUROPATHY 10/21/2006  . Endometriosis   . Fibroids     Past Surgical History  Procedure Laterality Date  . Abdominal hysterectomy      No Known Allergies  Current Outpatient Prescriptions  Medication Sig Dispense Refill  . acetaminophen (TYLENOL) 650 MG CR tablet Take 1,300 mg by mouth daily as needed for pain.       Marland Kitchen amLODipine (NORVASC) 10 MG tablet Take 10 mg by mouth daily.      Marland Kitchen aspirin EC 81 MG tablet Take 81 mg by mouth daily.      Marland Kitchen atorvastatin (LIPITOR) 80 MG tablet Take 1 tablet (80 mg total) by mouth at bedtime.  30 tablet  5  . Canagliflozin [facility])  100 MG TABS Take 1 tablet (100 mg total) by mouth daily.  90 tablet  4  . chlorthalidone (HYGROTON) 25 MG tablet Take 25 mg by mouth daily.      . chlorthalidone (HYGROTON) 25 MG tablet TAKE ONE TABLET BY MOUTH ONCE DAILY  90 tablet  0  . chlorthalidone (HYGROTON) 25 MG tablet TAKE ONE TABLET BY MOUTH ONCE DAILY  90 tablet  0  . clopidogrel (PLAVIX) 75 MG tablet Take 1 tablet (75 mg total) by mouth daily with breakfast.  30 tablet  6  . gabapentin (NEURONTIN) 300 MG  capsule Take 300 mg by mouth 3 (three) times daily as needed (pain).       . insulin aspart (NOVOLOG FLEXPEN) 100 UNIT/ML FlexPen Inject 38-42 Units into the skin 3 (three) times daily with meals. 42 units before breakfast, 38 units before lunch and dinner      . Insulin Glargine (LANTUS SOLOSTAR) 100 UNIT/ML Solostar Pen Inject 60 Units into the skin at bedtime.      Marland Kitchen losartan (COZAAR) 100 MG tablet Take 1 tablet (100 mg total) by mouth daily.  90 tablet  1  . metFORMIN (GLUCOPHAGE) 500 MG tablet Take 1,000 mg by mouth 2 (two) times daily with a meal.      . nitroGLYCERIN (NITROSTAT) 0.4 MG SL tablet Place 1 tablet (0.4 mg total) under the tongue every 5 (five) minutes x 3 doses as needed for chest pain.  30 tablet  12  . potassium chloride SA (K-DUR,KLOR-CON) 20 MEQ tablet Take 1 tablet (20 mEq total) by mouth daily.  30 tablet  5   No current facility-administered medications for this visit.    History   Social History  . Marital Status: Single    Spouse Name: N/A    Number of Children: N/A  . Years of Education: N/A   Occupational History  . Not on file.   Social History Main Topics  . Smoking status: Never Smoker   . Smokeless tobacco: Never Used  . Alcohol Use: No  . Drug Use: No  . Sexual Activity: Not on file   Other Topics Concern  . Not on file   Social History Narrative   Regular exercise: no   Caffeine use: ocassionally    History reviewed. No pertinent family history.  ROS is negative for fevers, chills or night sweats. She denies change in weight. She denies change in vision or hearing. She denies cough or increased sputum production. She is unaware lymphadenopathy. There is no presyncope or syncope. She denies palpitations. He denies recurrent chest tightness. There is no wheezing. She denies nausea vomiting or diarrhea. She is unaware of blood in stool or urine. She denies claudication. He denies myalgias.  There is no leg swelling.. she is diabetic. There is  no history of thyroid abnormalities. She is unaware of sleep disturbance. Other comprehensive 14 point system review is negative.  PE BP 162/90  Pulse 79  Ht 5\' 6"  (1.676 m)  Wt 224 lb 12.8 oz (101.969 kg)  BMI 36.30 kg/m2  General: Alert, oriented, no distress.  Skin: normal turgor, no rashes HEENT: Normocephalic, atraumatic. Pupils round and reactive; sclera anicteric;no lid lag. Extraocular muscles intact;; no xanthelasmas. Nose without nasal septal hypertrophy Mouth/Parynx benign; Mallinpatti scale 3 Neck: No JVD, no carotid bruits; normal carotid upstroke Lungs: clear to ausculatation and percussion; no wheezing or rales Chest wall: no tenderness to palpitation Heart: RRR, s1 s2 normal; 1/6 systolic murmur; no S3 or  S4 gallop no diastolic murmur, rub thrills or heaves Abdomen: soft, nontender; no hepatosplenomehaly, BS+; abdominal aorta nontender and not dilated by palpation. Back: no CVA tenderness Pulses 2+ Extremities: no clubbing cyanosis or edema, Homan's sign negative  Neurologic: grossly nonfocal; cranial nerves grossly normal. Psychologic: normal affect and mood.  ECG today (independently read by me): Sinus rhythm at 79 beats per minute.  Poor with progression anteriorly.  ECG (independently read by me): Normal sinus rhythm at 86 beats per minute anterior Q waves with her MI. QTc interval 459 ms   LABS:  BMET    Component Value Date/Time   NA 138 05/25/2013 1020   K 4.4 05/25/2013 1020   CL 97 05/25/2013 1020   CO2 32 05/25/2013 1020   GLUCOSE 166* 05/25/2013 1020   GLUCOSE 112* 02/13/2006 1112   BUN 22 05/25/2013 1020   CREATININE 1.23* 05/25/2013 1020   CREATININE 1.06 03/27/2013 0359   CALCIUM 9.6 05/25/2013 1020   GFRNONAA 59* 03/27/2013 0359   GFRAA 69* 03/27/2013 0359     Hepatic Function Panel     Component Value Date/Time   PROT 7.8 05/25/2013 1020   ALBUMIN 4.5 05/25/2013 1020   AST 16 05/25/2013 1020   ALT 12 05/25/2013 1020   ALKPHOS 57 05/25/2013 1020    BILITOT 0.5 05/25/2013 1020   BILIDIR 0.0 07/02/2010 1121     CBC    Component Value Date/Time   WBC 7.2 05/25/2013 1020   RBC 4.88 05/25/2013 1020   HGB 11.9* 05/25/2013 1020   HCT 35.6* 05/25/2013 1020   PLT 222 05/25/2013 1020   MCV 73.0* 05/25/2013 1020   MCH 24.4* 05/25/2013 1020   MCHC 33.4 05/25/2013 1020   RDW 16.5* 05/25/2013 1020   LYMPHSABS 2.5 03/23/2013 2150   MONOABS 0.9 03/23/2013 2150   EOSABS 0.1 03/23/2013 2150   BASOSABS 0.0 03/23/2013 2150     BNP No results found for this basename: probnp    Lipid Panel     Component Value Date/Time   CHOL 150 05/25/2013 1020   TRIG 68 05/25/2013 1020   HDL 50 05/25/2013 1020   CHOLHDL 3.0 05/25/2013 1020   VLDL 14 05/25/2013 1020   LDLCALC 86 05/25/2013 1020     RADIOLOGY: No results found.    ASSESSMENT AND PLAN: Lynn Hobbs suffered an ST segment elevation myocardial infarction and presented urgently to the catheterization laboratory in the morning of 03/24/2013. She was found to have subtotal 99% LAD stenosis with TIMI one half flow initially.  She has been on a good medical regimen.  Following her myocardial infarction.  When I last saw her I discontinued her labetalol and further titrating her carvedilol to 25 mg twice a day.  Her blood pressure today is elevated and her resting pulse is approximately 80.  I suggested to further titrate the carvedilol to 37.5 mg twice a day.  She will continue her current dose of losartan 100 mg and she takes chlorthalidone 25 mg daily in addition to amlodipine 10 mg for blood pressure control.  I reviewed her laboratory with her.  Her lipid studies were significantly improved with the addition of atorvastatin 80 mg to her medical regimen.  She is on Intal, as well as insulin both Lantus and NovoLog Flex pen for diabetes.  At this point, with her ejection fraction showing improvement in ejection fraction of 45%.  I recommended that she can discontinue her lifevest.  She will monitor her heart  rate and  blood pressure.  I recommended increased exercise as tolerated.  I will see her in 4 months for cardiology reevaluation.   Troy Sine, MD, [facility]  06/30/2013 10:09 AM

## 2013-06-30 NOTE — Patient Instructions (Signed)
Your physician has recommended you make the following change in your medication: increase the carvedilol 25mg  - take 1 & 1/2 tablet twice daily. A new prescription for this dose change has been written.  You can discontinue the use of the life vest and return it to the company.  Your physician recommends that you schedule a follow-up appointment in: 4 months.

## 2013-07-04 ENCOUNTER — Encounter: Payer: Self-pay | Admitting: Internal Medicine

## 2013-07-04 ENCOUNTER — Ambulatory Visit (INDEPENDENT_AMBULATORY_CARE_PROVIDER_SITE_OTHER): Payer: Medicare Other | Admitting: Internal Medicine

## 2013-07-04 ENCOUNTER — Other Ambulatory Visit: Payer: Self-pay | Admitting: *Deleted

## 2013-07-04 VITALS — BP 156/90 | HR 78 | Temp 98.5°F | Resp 20 | Ht 66.0 in | Wt 225.0 lb

## 2013-07-04 DIAGNOSIS — G609 Hereditary and idiopathic neuropathy, unspecified: Secondary | ICD-10-CM

## 2013-07-04 DIAGNOSIS — I10 Essential (primary) hypertension: Secondary | ICD-10-CM

## 2013-07-04 DIAGNOSIS — D649 Anemia, unspecified: Secondary | ICD-10-CM

## 2013-07-04 DIAGNOSIS — E785 Hyperlipidemia, unspecified: Secondary | ICD-10-CM | POA: Diagnosis not present

## 2013-07-04 DIAGNOSIS — I2510 Atherosclerotic heart disease of native coronary artery without angina pectoris: Secondary | ICD-10-CM | POA: Diagnosis not present

## 2013-07-04 LAB — IRON: Iron: 60 ug/dL (ref 42–145)

## 2013-07-04 LAB — IBC PANEL
Iron: 60 ug/dL (ref 42–145)
Saturation Ratios: 19.1 % — ABNORMAL LOW (ref 20.0–50.0)
Transferrin: 224.5 mg/dL (ref 212.0–360.0)

## 2013-07-04 MED ORDER — "PEN NEEDLES 5/16"" 31G X 8 MM MISC": 1.0000 | Freq: Four times a day (QID) | Status: DC | PRN

## 2013-07-04 NOTE — Patient Instructions (Signed)
Please mail back  Stool samples to  check for hidden blood

## 2013-07-04 NOTE — Progress Notes (Signed)
Subjective:    Patient ID: Lynn Hobbs, female    DOB: 02-25-1961, 53 y.o.   MRN: [id number]  HPI  53 year old patient who is followed closely by cardiology due to coronary artery disease.  CBC has revealed mild microcytic anemia.  The patient has had a remote hysterectomy and and did have a colonoscopy performed by Dr. Deatra Ina at the [facility] office in the past.  Denies any abdominal pain, dyspepsia, or melena.  She does have mild renal insufficiency.  Past Medical History  Diagnosis Date  . BENIGN NEOPLASM OF SKIN SITE UNSPECIFIED 09/08/2008  . CEREBROVASCULAR DISEASE 12/11/2008  . DIABETES MELLITUS, TYPE II 10/12/2006  . HYPERLIPIDEMIA 10/12/2006  . HYPERTENSION 10/12/2006  . PERIPHERAL NEUROPATHY 10/21/2006  . Endometriosis   . Fibroids     History   Social History  . Marital Status: Single    Spouse Name: N/A    Number of Children: N/A  . Years of Education: N/A   Occupational History  . Not on file.   Social History Main Topics  . Smoking status: Never Smoker   . Smokeless tobacco: Never Used  . Alcohol Use: No  . Drug Use: No  . Sexual Activity: Not on file   Other Topics Concern  . Not on file   Social History Narrative   Regular exercise: no   Caffeine use: ocassionally    Past Surgical History  Procedure Laterality Date  . Abdominal hysterectomy      History reviewed. No pertinent family history.  No Known Allergies  Current Outpatient Prescriptions on File Prior to Visit  Medication Sig Dispense Refill  . acetaminophen (TYLENOL) 650 MG CR tablet Take 1,300 mg by mouth daily as needed for pain.       Marland Kitchen amLODipine (NORVASC) 10 MG tablet Take 10 mg by mouth daily.      Marland Kitchen aspirin EC 81 MG tablet Take 81 mg by mouth daily.      Marland Kitchen atorvastatin (LIPITOR) 80 MG tablet Take 1 tablet (80 mg total) by mouth at bedtime.  30 tablet  5  . Canagliflozin (INVOKANA) 100 MG TABS Take 1 tablet (100 mg total) by mouth daily.  90 tablet  4  . carvedilol (COREG) 25 MG tablet  Take 1.5 tablets twice a day  90 tablet  6  . chlorthalidone (HYGROTON) 25 MG tablet Take 25 mg by mouth daily.      . clopidogrel (PLAVIX) 75 MG tablet Take 1 tablet (75 mg total) by mouth daily with breakfast.  30 tablet  6  . gabapentin (NEURONTIN) 300 MG capsule Take 300 mg by mouth 3 (three) times daily as needed (pain).       . insulin aspart (NOVOLOG FLEXPEN) 100 UNIT/ML FlexPen Inject 38-42 Units into the skin 3 (three) times daily with meals. 42 units before breakfast, 38 units before lunch and dinner      . Insulin Glargine (LANTUS SOLOSTAR) 100 UNIT/ML Solostar Pen Inject 60 Units into the skin at bedtime.      Marland Kitchen losartan (COZAAR) 100 MG tablet Take 1 tablet (100 mg total) by mouth daily.  90 tablet  1  . metFORMIN (GLUCOPHAGE) 500 MG tablet Take 1,000 mg by mouth 2 (two) times daily with a meal.      . nitroGLYCERIN (NITROSTAT) 0.4 MG SL tablet Place 1 tablet (0.4 mg total) under the tongue every 5 (five) minutes x 3 doses as needed for chest pain.  30 tablet  12  . potassium  chloride SA (K-DUR,KLOR-CON) 20 MEQ tablet Take 1 tablet (20 mEq total) by mouth daily.  30 tablet  5   No current facility-administered medications on file prior to visit.    BP 156/90  Pulse 78  Temp(Src) 98.5 F (36.9 C) (Oral)  Resp 20  Ht 5\' 6"  (1.676 m)  Wt 225 lb (102.059 kg)  BMI 36.33 kg/m2  SpO2 99%       Review of Systems  Constitutional: Negative.   HENT: Negative for congestion, dental problem, hearing loss, rhinorrhea, sinus pressure, sore throat and tinnitus.   Eyes: Negative for pain, discharge and visual disturbance.  Respiratory: Negative for cough and shortness of breath.   Cardiovascular: Negative for chest pain, palpitations and leg swelling.  Gastrointestinal: Negative for nausea, vomiting, abdominal pain, diarrhea, constipation, blood in stool and abdominal distention.  Genitourinary: Negative for dysuria, urgency, frequency, hematuria, flank pain, vaginal bleeding, vaginal  discharge, difficulty urinating, vaginal pain and pelvic pain.  Musculoskeletal: Negative for arthralgias, gait problem and joint swelling.  Skin: Negative for rash.  Neurological: Negative for dizziness, syncope, speech difficulty, weakness, numbness and headaches.  Hematological: Negative for adenopathy.  Psychiatric/Behavioral: Negative for behavioral problems, dysphoric mood and agitation. The patient is not nervous/anxious.        Objective:   Physical Exam  Constitutional: She is oriented to person, place, and time. She appears well-developed and well-nourished.  HENT:  Head: Normocephalic.  Right Ear: External ear normal.  Left Ear: External ear normal.  Mouth/Throat: Oropharynx is clear and moist.  Eyes: Conjunctivae and EOM are normal. Pupils are equal, round, and reactive to light.  Neck: Normal range of motion. Neck supple. No thyromegaly present.  Cardiovascular: Normal rate, regular rhythm, normal heart sounds and intact distal pulses.   Pulmonary/Chest: Effort normal and breath sounds normal.  Abdominal: Soft. Bowel sounds are normal. She exhibits no mass. There is no tenderness.  Musculoskeletal: Normal range of motion.  Lymphadenopathy:    She has no cervical adenopathy.  Neurological: She is alert and oriented to person, place, and time.  Skin: Skin is warm and dry. No rash noted.  Psychiatric: She has a normal mood and affect. Her behavior is normal.          Assessment & Plan:   Microcytic anemia.  We'll check stool for occult blood and check some indices.  Iron deficiency anemia versus anemia of chronic disease Hypertension.  Blood pressure 130/70 Coronary artery disease Diabetes

## 2013-07-04 NOTE — Progress Notes (Signed)
Pre-visit discussion using our clinic review tool. No additional management support is needed unless otherwise documented below in the visit note.  

## 2013-07-05 LAB — FERRITIN: Ferritin: 50.2 ng/mL (ref 10.0–291.0)

## 2013-07-18 ENCOUNTER — Other Ambulatory Visit: Payer: Self-pay | Admitting: Internal Medicine

## 2013-07-22 ENCOUNTER — Other Ambulatory Visit: Payer: Self-pay | Admitting: Internal Medicine

## 2013-08-30 ENCOUNTER — Encounter: Payer: Self-pay | Admitting: Internal Medicine

## 2013-08-30 ENCOUNTER — Ambulatory Visit (INDEPENDENT_AMBULATORY_CARE_PROVIDER_SITE_OTHER): Payer: Medicare Other | Admitting: Internal Medicine

## 2013-08-30 VITALS — BP 134/84 | HR 86 | Temp 98.7°F | Ht 66.0 in | Wt 222.0 lb

## 2013-08-30 DIAGNOSIS — I2510 Atherosclerotic heart disease of native coronary artery without angina pectoris: Secondary | ICD-10-CM

## 2013-08-30 DIAGNOSIS — E1142 Type 2 diabetes mellitus with diabetic polyneuropathy: Secondary | ICD-10-CM

## 2013-08-30 DIAGNOSIS — E1149 Type 2 diabetes mellitus with other diabetic neurological complication: Secondary | ICD-10-CM

## 2013-08-30 LAB — HEMOGLOBIN A1C: Hgb A1c MFr Bld: 9.5 % — ABNORMAL HIGH (ref 4.6–6.5)

## 2013-08-30 MED ORDER — INSULIN GLARGINE 100 UNIT/ML SOLOSTAR PEN: 30.0000 [IU] | PEN_INJECTOR | Freq: Every day | SUBCUTANEOUS | Status: DC

## 2013-08-30 MED ORDER — INSULIN ASPART 100 UNIT/ML FLEXPEN: PEN_INJECTOR | SUBCUTANEOUS | Status: DC

## 2013-08-30 NOTE — Patient Instructions (Signed)
-   Please continue Metformin 1000 mg 2x a day - Decrease Lantus to 30 units at night - Decrease Novolog to 25 in am and 20 before dinner - Stop Invokana Please return in 3 months with your sugar log.  Please stop at the lab.

## 2013-08-30 NOTE — Progress Notes (Signed)
Patient ID: Lynn Hobbs, female   DOB: 02/23/1961, 53 y.o.   MRN: [id number]  HPI: Lynn Hobbs is a 53 y.o.-year-old female, returning for f/u of DM2, dx 2004, insulin-dependent, uncontrolled, with complications (?peripheral neuropathy, STEMI 03/2013, cerebrovascular ds, DR). She is here with her mother, who is also diabetic, and offers part of the history. Last visit 3 mo ago.   Last hemoglobin A1c was: Lab Results  Component Value Date   HGBA1C 8.0* 05/25/2013   HGBA1C 8.8* 03/08/2013   HGBA1C 10.7* 12/02/2012   Pt was on a regimen of: - Metformin XR (Glumetza) 1000 mg bid - Lantus 60 << 80 units qhs - not missing doses - pen - Novolog 42 in am and 38 before dinner - not missing doses - pen - Invokana 100 mg daily (not 300 mg due to urinary frequency)  At last visit, we decreased the insulin doses: - Metformin 1000 mg 2x a day - Lantus 40 units at night - Novolog to 35 in am and 30 before dinner - Invokana 100 mg daily >> urinates a lot at night >> impaired sleep  Pt checks her sugars 0-2x a day and they are improved: - am: 102-145 >> 64-146 (one 160) >> 57-128 (most 90-100) >> 49-124 - 2h after b'fast: 91-200 (130-150) >> 101, 95 >> 102-150 - before lunch: 43-140 >> n/c >> 125-130 - before dinner: 139, 140, 229 ( 229 x 1, after a snack) >> 110, 128 >> 125, 130 - 2h after dinner: 96, 108 >> 140 >> n/c - bedtime: 102, 113, last night 47 >> 49, 60, 62, 77, 101 >> n/c - nighttime: 54 x 1 >> n/c Lowest sugar was 49; she has hypoglycemia awareness at 60. Highest sugar was 150 in last month..  Pt improved her diet >> lost weight.   - mild CKD, last BUN/creatinine:  Lab Results  Component Value Date   BUN 22 05/25/2013   CREATININE 1.23* 05/25/2013  Last ACR high (12/02/2012): 53.8, previously 64.6. She is on Losartan (changed from Olmesartan). - last set of lipids: Lab Results  Component Value Date   CHOL 150 05/25/2013   HDL 50 05/25/2013   LDLCALC 86 05/25/2013   LDLDIRECT 214.4 12/02/2012   TRIG 68 05/25/2013   CHOLHDL 3.0 05/25/2013  she is on  Atorvastatin 80. - last eye exam was in 12/20/2012 - Dr. Tempie Hoist. 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  - no numbness and tingling in her feet. She is on aspirin 81 mg.  She has a history of HTN, HL. She had a STEMI  (03/2013) >> midLAD stent placed. She was started on Plavix and beta blocker. Cardiologist: Dr Claiborne Billings.  ROS: Constitutional: + weight loss, no fatigue, no subjective hyperthermia/hypothermia, + nocturia Eyes: no blurry vision, no xerophthalmia ENT: no sore throat, no nodules palpated in throat, no dysphagia/odynophagia, no hoarseness Cardiovascular: no CP/SOB/palpitations/leg swelling Respiratory: no cough/SOB Gastrointestinal: no N/V/D/C Musculoskeletal: no muscle/joint aches Skin: no rashes  I reviewed pt's medications, allergies, PMH, social hx, family hx and no changes required, except as mentioned above and also she increased her Carvedilol to 1.5 tabs 2x daily.  PE: BP 134/84  Pulse 86  Temp(Src) 98.7 F (37.1 C) (Oral)  Ht 5\' 6"  (1.676 m)  Wt 222 lb (100.699 kg)  BMI 35.85 kg/m2  SpO2 96% Wt Readings from Last 3 Encounters:  08/30/13 222 lb (100.699 kg)  07/04/13 225 lb (  102.059 kg)  06/30/13 224 lb 12.8 oz (101.969 kg)   Constitutional: overweight, in NAD, uses cane Eyes: PERRLA, EOMI, no exophthalmos ENT: moist mucous membranes, no thyromegaly, no cervical lymphadenopathy Cardiovascular: RRR, No MRG Respiratory: CTA B Gastrointestinal: abdomen soft, NT, ND, BS+ Musculoskeletal: left arm and leg paresis Skin: moist, warm, no rashes Neurological: no tremor with outstretched hands, DTR normal in all 4  ASSESSMENT: 1. DM2, insulin-dependent, uncontrolled, with complications - ? PN - on Neurontin - cerebrovascular ds. 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 Patient  with long-standing, recently more uncontrolled diabetes, but with improved control since we adjusted the basal-bolus insulin regimen and she started to improve her diet, and especially after her stent >> she started to have low CBGs, which persist despite decreasing the insulin doses at last visit. - she has frequent urination and nocturia 2/2 Invokana >> will try her off - I suggested to :  Patient Instructions  - Please continue Metformin 1000 mg 2x a day - Decrease Lantus to 30 units at night - Decrease Novolog to 25 in am and 20 before dinner - Stop Invokana Please return in 3 months with your sugar log.  Please stop at the lab. - continue checking her sugars at different times of the day - check 2 times a day, rotating checks - given new sugar logs - she is up to date with eye exams - will check HbA1c today - Return to clinic in 3 months with sugar log - advised to let me know if sugars <80 or >180.  Office Visit on 08/30/2013  Component Date Value Ref Range Status  . Hemoglobin A1C 08/30/2013 9.5* 4.6 - 6.5 % Final   Glycemic Control Guidelines for People with Diabetes:Non Diabetic:  <6%Goal of Therapy: <7%Additional Action Suggested:  >8%    Unexpected increase in HbA1c >> will advise pt to come back in 1 mo rather than 3, with her sugar log. For now, continue the above insulin doses.

## 2013-09-06 ENCOUNTER — Ambulatory Visit: Payer: Medicare Other | Admitting: Internal Medicine

## 2013-09-26 ENCOUNTER — Encounter: Payer: Self-pay | Admitting: Internal Medicine

## 2013-09-26 ENCOUNTER — Ambulatory Visit (INDEPENDENT_AMBULATORY_CARE_PROVIDER_SITE_OTHER): Payer: Medicare Other | Admitting: Internal Medicine

## 2013-09-26 VITALS — BP 136/80 | HR 74 | Temp 98.2°F | Resp 20 | Ht 66.0 in | Wt 225.0 lb

## 2013-09-26 DIAGNOSIS — I10 Essential (primary) hypertension: Secondary | ICD-10-CM | POA: Diagnosis not present

## 2013-09-26 DIAGNOSIS — E0942 Drug or chemical induced diabetes mellitus with neurological complications with diabetic polyneuropathy: Secondary | ICD-10-CM

## 2013-09-26 DIAGNOSIS — I679 Cerebrovascular disease, unspecified: Secondary | ICD-10-CM | POA: Diagnosis not present

## 2013-09-26 DIAGNOSIS — E785 Hyperlipidemia, unspecified: Secondary | ICD-10-CM | POA: Diagnosis not present

## 2013-09-26 DIAGNOSIS — E1349 Other specified diabetes mellitus with other diabetic neurological complication: Secondary | ICD-10-CM | POA: Diagnosis not present

## 2013-09-26 DIAGNOSIS — E1142 Type 2 diabetes mellitus with diabetic polyneuropathy: Secondary | ICD-10-CM

## 2013-09-26 DIAGNOSIS — I2510 Atherosclerotic heart disease of native coronary artery without angina pectoris: Secondary | ICD-10-CM | POA: Diagnosis not present

## 2013-09-26 MED ORDER — CHLORTHALIDONE 25 MG PO TABS: 25.0000 mg | ORAL_TABLET | Freq: Every day | ORAL | Status: DC

## 2013-09-26 MED ORDER — POTASSIUM CHLORIDE CRYS ER 20 MEQ PO TBCR: 20.0000 meq | EXTENDED_RELEASE_TABLET | Freq: Every day | ORAL | Status: DC

## 2013-09-26 MED ORDER — GLUCOSE BLOOD VI STRP: 1.0000 | ORAL_STRIP | Freq: Three times a day (TID) | Status: DC

## 2013-09-26 NOTE — Progress Notes (Signed)
Pre visit review using our clinic review tool, if applicable. No additional management support is needed unless otherwise documented below in the visit note. 

## 2013-09-26 NOTE — Progress Notes (Signed)
Subjective:    Patient ID: Lynn Hobbs, female    DOB: [birth year], 53 y.o.   MRN: [id number]  HPI 53 year old patient who is in today for followup.  She is followed closely by cardiology, as well as endocrinology.  Her last chemotherapy A1c was not well-controlled at 9 point 5.  She is scheduled for followup later this week She has treated hypertension and cerebrovascular disease.  No focal neurological symptoms.  Her cardiac status appears stable.  Past Medical History  Diagnosis Date  . BENIGN NEOPLASM OF SKIN SITE UNSPECIFIED 09/08/2008  . CEREBROVASCULAR DISEASE 12/11/2008  . DIABETES MELLITUS, TYPE II 10/12/2006  . HYPERLIPIDEMIA 10/12/2006  . HYPERTENSION 10/12/2006  . PERIPHERAL NEUROPATHY 10/21/2006  . Endometriosis   . Fibroids     History   Social History  . Marital Status: Single    Spouse Name: N/A    Number of Children: N/A  . Years of Education: N/A   Occupational History  . Not on file.   Social History Main Topics  . Smoking status: Never Smoker   . Smokeless tobacco: Never Used  . Alcohol Use: No  . Drug Use: No  . Sexual Activity: Not on file   Other Topics Concern  . Not on file   Social History Narrative   Regular exercise: no   Caffeine use: ocassionally    Past Surgical History  Procedure Laterality Date  . Abdominal hysterectomy      History reviewed. No pertinent family history.  No Known Allergies  Current Outpatient Prescriptions on File Prior to Visit  Medication Sig Dispense Refill  . acetaminophen (TYLENOL) 650 MG CR tablet Take 1,300 mg by mouth daily as needed for pain.       Marland Kitchen amLODipine (NORVASC) 10 MG tablet TAKE ONE TABLET BY MOUTH ONCE DAILY  90 tablet  1  . aspirin EC 81 MG tablet Take 81 mg by mouth daily.      Marland Kitchen atorvastatin (LIPITOR) 80 MG tablet Take 1 tablet (80 mg total) by mouth at bedtime.  30 tablet  5  . carvedilol (COREG) 25 MG tablet Take 1.5 tablets twice a day  90 tablet  6  . clopidogrel (PLAVIX) 75 MG tablet  Take 1 tablet (75 mg total) by mouth daily with breakfast.  30 tablet  6  . gabapentin (NEURONTIN) 300 MG capsule Take 300 mg by mouth 3 (three) times daily as needed (pain).       . insulin aspart (NOVOLOG FLEXPEN) 100 UNIT/ML FlexPen Inject under skin 25 units before brunch, 20 units before dinner  15 mL  3  . Insulin Glargine (LANTUS SOLOSTAR) 100 UNIT/ML Solostar Pen Inject 30 Units into the skin at bedtime.  15 mL  3  . Insulin Pen Needle (PEN NEEDLES 31GX5/16") 31G X 8 MM MISC 1 each by Does not apply route 4 (four) times daily as needed.  100 each  12  . losartan (COZAAR) 100 MG tablet TAKE ONE TABLET BY MOUTH ONCE DAILY  90 tablet  1  . metFORMIN (GLUCOPHAGE) 500 MG tablet Take 1,000 mg by mouth 2 (two) times daily with a meal.      . nitroGLYCERIN (NITROSTAT) 0.4 MG SL tablet Place 1 tablet (0.4 mg total) under the tongue every 5 (five) minutes x 3 doses as needed for chest pain.  30 tablet  12   No current facility-administered medications on file prior to visit.    BP 136/80  Pulse 74  Temp(Src) 98.2  F (36.8 C) (Oral)  Resp 20  Ht 5\' 6"  (1.676 m)  Wt 225 lb (102.059 kg)  BMI 36.33 kg/m2  SpO2 99%     Review of Systems  Constitutional: Positive for fatigue.  HENT: Negative for congestion, dental problem, hearing loss, rhinorrhea, sinus pressure, sore throat and tinnitus.   Eyes: Negative for pain, discharge and visual disturbance.  Respiratory: Negative for cough and shortness of breath.   Cardiovascular: Negative for chest pain, palpitations and leg swelling.  Gastrointestinal: Negative for nausea, vomiting, abdominal pain, diarrhea, constipation, blood in stool and abdominal distention.  Genitourinary: Negative for dysuria, urgency, frequency, hematuria, flank pain, vaginal bleeding, vaginal discharge, difficulty urinating, vaginal pain and pelvic pain.  Musculoskeletal: Positive for gait problem and myalgias. Negative for arthralgias and joint swelling.  Skin: Negative  for rash.  Neurological: Positive for weakness. Negative for dizziness, syncope, speech difficulty, numbness and headaches.  Hematological: Negative for adenopathy.  Psychiatric/Behavioral: Negative for behavioral problems, dysphoric mood and agitation. The patient is not nervous/anxious.        Objective:   Physical Exam  Constitutional: She is oriented to person, place, and time. She appears well-developed and well-nourished.  HENT:  Head: Normocephalic.  Right Ear: External ear normal.  Left Ear: External ear normal.  Mouth/Throat: Oropharynx is clear and moist.  Eyes: Conjunctivae and EOM are normal. Pupils are equal, round, and reactive to light.  Neck: Normal range of motion. Neck supple. No thyromegaly present.  Cardiovascular: Normal rate, regular rhythm, normal heart sounds and intact distal pulses.   Pulmonary/Chest: Effort normal and breath sounds normal.  Abdominal: Soft. Bowel sounds are normal. She exhibits no mass. There is no tenderness.  Musculoskeletal: Normal range of motion.  Lymphadenopathy:    She has no cervical adenopathy.  Neurological: She is alert and oriented to person, place, and time.  Skin: Skin is warm and dry. No rash noted.  Psychiatric: She has a normal mood and affect. Her behavior is normal.          Assessment & Plan:   Diabetes mellitus  Follow up in endocrinology this week  Coronary artery disease.  Follow cardiology  Cerebrovascular disease  Hypertension  Dyslipidemia.  Continue statin therapy   Recheck 6 months or as needed  Medications updated  No change in therapy

## 2013-09-26 NOTE — Patient Instructions (Signed)
Limit your sodium (Salt) intake   Please check your hemoglobin A1c every 3 months  Return in 6 months for follow-up  

## 2013-09-29 ENCOUNTER — Encounter: Payer: Self-pay | Admitting: Internal Medicine

## 2013-09-29 ENCOUNTER — Ambulatory Visit (INDEPENDENT_AMBULATORY_CARE_PROVIDER_SITE_OTHER): Payer: Medicare Other | Admitting: Internal Medicine

## 2013-09-29 VITALS — BP 118/70 | HR 80 | Temp 98.1°F | Resp 12 | Wt 225.0 lb

## 2013-09-29 DIAGNOSIS — E1349 Other specified diabetes mellitus with other diabetic neurological complication: Secondary | ICD-10-CM | POA: Diagnosis not present

## 2013-09-29 DIAGNOSIS — I2510 Atherosclerotic heart disease of native coronary artery without angina pectoris: Secondary | ICD-10-CM

## 2013-09-29 DIAGNOSIS — E1142 Type 2 diabetes mellitus with diabetic polyneuropathy: Secondary | ICD-10-CM | POA: Diagnosis not present

## 2013-09-29 DIAGNOSIS — E0942 Drug or chemical induced diabetes mellitus with neurological complications with diabetic polyneuropathy: Secondary | ICD-10-CM

## 2013-09-29 MED ORDER — INSULIN GLARGINE 100 UNIT/ML SOLOSTAR PEN: 35.0000 [IU] | PEN_INJECTOR | Freq: Every day | SUBCUTANEOUS | Status: DC

## 2013-09-29 NOTE — Progress Notes (Signed)
Patient ID: Lynn Hobbs, female   DOB: 1960-08-29, 53 y.o.   MRN: [id number]  HPI: Lynn Hobbs is a 53 y.o.-year-old female, returning for f/u of DM2, dx 2004, insulin-dependent, uncontrolled, with complications (?peripheral neuropathy, STEMI 03/2013, cerebrovascular ds, DR). She is here with her mother, who is also diabetic, and offers part of the history. Last visit 1 mo ago.   Last hemoglobin A1c was: Lab Results  Component Value Date   HGBA1C 9.5* 08/30/2013   HGBA1C 8.0* 05/25/2013   HGBA1C 8.8* 03/08/2013   She is on: - Metformin 1000 mg 2x a day - Lantus 30 units at night - Novolog 25 in am and 20 before dinner We stopped Invokana 100 mg daily >> urinating a lot at night >> impaired sleep.  Pt checks her sugars 3-4x a day and they are: - am: 102-145 >> 64-146 (one 160) >> 57-128 (most 90-100) >> 49-124 >> 114-185 - 2h after b'fast: 91-200 (130-150) >> 101, 95 >> 102-150 >> 49x1, 66-153 - before lunch: 43-140 >> n/c >> 125-130 >> 104-178 (sometimes eats popcorn before this meal) - 2h after lunch: 89-168 - before dinner: 139, 140, 229 ( 229 x 1, after a snack) >> 110, 128 >> 125, 130 >> 83-148, 175x1 - 2h after dinner: 96, 108 >> 140 >> n/c >> 84 - bedtime: 102, 113, last night 47 >> 49, 60, 62, 77, 101 >> n/c >> 75-139 - nighttime: 54 x 1 >> n/c Lowest sugar was 49 x1; she has hypoglycemia awareness at 60. Highest sugar was 204 in last month.  Meals: - b'fast ~ 10 am - dinner ~2:30-3 pm Afterthis, she eats very light, usually fruit.  - mild CKD, last BUN/creatinine:  Lab Results  Component Value Date   BUN 22 05/25/2013   CREATININE 1.23* 05/25/2013  Last ACR high (12/02/2012): 53.8, previously 64.6. She is on Losartan (changed from Olmesartan). - last set of lipids: Lab Results  Component Value Date   CHOL 150 05/25/2013   HDL 50 05/25/2013   LDLCALC 86 05/25/2013   LDLDIRECT 214.4 12/02/2012   TRIG 68 05/25/2013   CHOLHDL 3.0 05/25/2013  she is on  Atorvastatin  80. - last eye exam was in 06/27/2012 - Dr. Tempie Hoist. 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 Macular edema - per pt description >> new eye exam on 12/28/2013.  - no numbness and tingling in her feet. She is on aspirin 81 mg.  She has a history of HTN, HL. She had a STEMI  (03/2013) >> midLAD stent placed. She was started on Plavix and beta blocker. Cardiologist: Dr Claiborne Billings.  ROS: Constitutional: no weight loss/gain, no fatigue, no subjective hyperthermia/hypothermia Eyes: no blurry vision, no xerophthalmia ENT: no sore throat, no nodules palpated in throat, no dysphagia/odynophagia, no hoarseness Cardiovascular: no CP/SOB/palpitations/leg swelling Respiratory: no cough/SOB Gastrointestinal: no N/V/D/C Musculoskeletal: no muscle/joint aches Skin: no rashes  I reviewed pt's medications, allergies, PMH, social hx, family hx and no changes required, except as mentioned above and also she increased her Carvedilol to 1.5 tabs 2x daily.  PE: BP 118/70  Pulse 80  Temp(Src) 98.1 F (36.7 C) (Oral)  Resp 12  Wt 225 lb (102.059 kg)  SpO2 98% Wt Readings from Last 3 Encounters:  09/29/13 225 lb (102.059 kg)  09/26/13 225 lb (102.059 kg)  08/30/13 222 lb (100.699 kg)   Constitutional: overweight, in NAD, uses cane Eyes: PERRLA, EOMI, no exophthalmos ENT: moist mucous membranes, no thyromegaly, no cervical lymphadenopathy Cardiovascular: RRR, No  MRG Respiratory: CTA B Gastrointestinal: abdomen soft, NT, ND, BS+ Musculoskeletal: left arm and leg paresis Skin: moist, warm, no rashes Neurological: no tremor with outstretched hands, DTR normal in all 4  ASSESSMENT: 1. DM2, insulin-dependent, uncontrolled, with complications - ? PN - on Neurontin - cerebrovascular ds. 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 Patient with long-standing, recently more uncontrolled diabetes, but with improved control since we adjusted the basal-bolus insulin regimen and she  started to improve her diet, and especially after her cardiac stent. Her sugars do not match the elevated HbA1c!  - I suggested to :  Patient Instructions  - Please continue Metformin 1000 mg 2x a day - Increase Lantus to 35 units at night - Continue Novolog 25 in am and 20 before dinner. If sugars after breakfast continue to stay <100, please decrease the am NovoLog to 22 units. Please come back for a follow-up appointment in 2 months with your sugars log. - continue checking her sugars at different times of the day - check 2 times a day, rotating checks (she checks 3-4x a day) - given new sugar logs - she is up to date with eye exams - will check HbA1c at next visit (?check also fructosamine) - Return to clinic in 2 months with sugar log

## 2013-09-29 NOTE — Patient Instructions (Signed)
-   Please continue Metformin 1000 mg 2x a day - Increase Lantus to 35 units at night - Continue Novolog 25 in am and 20 before dinner. If sugars after breakfast continue to stay <100, please decrease the am NovoLog to 22 units.  Please come back for a follow-up appointment in 2 months with your sugars log.

## 2013-10-03 ENCOUNTER — Ambulatory Visit: Payer: Medicare Other | Admitting: Internal Medicine

## 2013-10-19 ENCOUNTER — Other Ambulatory Visit: Payer: Self-pay | Admitting: Adult Health

## 2013-10-19 NOTE — Telephone Encounter (Signed)
Rx refill sent to patient pharmacy   

## 2013-11-02 ENCOUNTER — Ambulatory Visit (INDEPENDENT_AMBULATORY_CARE_PROVIDER_SITE_OTHER): Payer: Medicare Other | Admitting: Cardiovascular Disease

## 2013-11-02 VITALS — BP 134/88 | HR 73 | Ht 66.0 in | Wt 223.0 lb

## 2013-11-02 DIAGNOSIS — E785 Hyperlipidemia, unspecified: Secondary | ICD-10-CM

## 2013-11-02 DIAGNOSIS — I10 Essential (primary) hypertension: Secondary | ICD-10-CM

## 2013-11-02 DIAGNOSIS — I2109 ST elevation (STEMI) myocardial infarction involving other coronary artery of anterior wall: Secondary | ICD-10-CM

## 2013-11-02 DIAGNOSIS — E1142 Type 2 diabetes mellitus with diabetic polyneuropathy: Secondary | ICD-10-CM

## 2013-11-02 DIAGNOSIS — I2510 Atherosclerotic heart disease of native coronary artery without angina pectoris: Secondary | ICD-10-CM

## 2013-11-02 DIAGNOSIS — I200 Unstable angina: Secondary | ICD-10-CM

## 2013-11-02 DIAGNOSIS — E1149 Type 2 diabetes mellitus with other diabetic neurological complication: Secondary | ICD-10-CM

## 2013-11-02 DIAGNOSIS — E669 Obesity, unspecified: Secondary | ICD-10-CM

## 2013-11-02 DIAGNOSIS — I249 Acute ischemic heart disease, unspecified: Secondary | ICD-10-CM

## 2013-11-02 NOTE — Patient Instructions (Signed)
Your physician wants you to follow-up in: 6 months. You will receive a reminder letter in the mail two months in advance. If you don't receive a letter, please call our office to schedule the follow-up appointment. No changes were made today in your therapy.  

## 2013-11-03 ENCOUNTER — Encounter: Payer: Self-pay | Admitting: Cardiovascular Disease

## 2013-11-03 DIAGNOSIS — E669 Obesity, unspecified: Secondary | ICD-10-CM | POA: Insufficient documentation

## 2013-11-03 NOTE — Progress Notes (Signed)
Patient ID: Lynn Hobbs, female   DOB: 1960-07-20, 53 y.o.   MRN: [id number]     HPI: Lynn Hobbs is a 53 y.o. female office for a 4 month cardiology evaluation following her ST segment elevation myocardial infarction.  Lynn Hobbs is a 53 year old African American female who has a history of diabetes mellitus with neurologic complications,a history of prior CVA, hyperlipidemia, and peripheral neuropathy. She developed chest pain leading to her hospitalization on 03/23/2013.  Subsequent ECG after arrival to the hospital in the early morning showed ST elevation anteriorly. Troponin was positive for 0.86 and urgent catheterization was performed by me. Catheterization demonstrated a 99% stenosis in the LAD on the proximal supple diagonal vessel with initial TIMI one half low. She a diffusely diseased distal LAD with 50-60% stenosis, and she also had 40-50% narrowings in the first diagonal vessel. The circumflex vessel had mild irregularities. The RCA was diffusely diseased with 40% near ostial narrowing, diffuse 70-80% mid stenoses, 80% stenosis before the acute margin, and 60% distal stenosis. She was discharged on 03/27/2013. She was sent home with a life vest. She saw Lynn Hobbs in the office for her initial followup evaluation on 04/04/2013 and I saw her one month ago for my initial post hospital evaluation.  Of note, during the hospital her LDL cholesterol was 184. Hemoglobin A1c was 8.1. A 2-D echo Doppler study pre-discharge following her initial event showed an ejection fraction of 30-35% and for this reason a life vest was initially recommended with plans for a followup echo in 3 months.  EKGs have been titrated as an outpatient  and a followup echo Doppler study on June 14, 2013 showed improved LV function with ejection fraction increasing to 45-50%.  She did have left ventricular hypertrophy.  There was akinesis of the apical myocardium.  When I last saw her her life as was discontinued  at that time.  She did have followup blood work on May 25, 2013.  This showed markedly improved.  Lipid status on her atorvastatin 80 mg with her total cholesterol improving from 259 to150 and your LDL cholesterol from 184 to 86.  HDL was 50, triglycerides 68.  Past 4 months, Lynn Hobbs has felt well.  She is now able to exercise.  She denies recurrent chest pain.  She is tolerating the titration of her carvedilol to 37.5 mg twice a day, chlorthalidone, 25 mg, Norvasc, 10 mg, losartan 100 mg daily, and her blood pressure has improved.  She presents for evaluation  Past Medical History  Diagnosis Date  . BENIGN NEOPLASM OF SKIN SITE UNSPECIFIED 09/08/2008  . CEREBROVASCULAR DISEASE 12/11/2008  . DIABETES MELLITUS, TYPE II 10/12/2006  . HYPERLIPIDEMIA 10/12/2006  . HYPERTENSION 10/12/2006  . PERIPHERAL NEUROPATHY 10/21/2006  . Endometriosis   . Fibroids     Past Surgical History  Procedure Laterality Date  . Abdominal hysterectomy      No Known Allergies  Current Outpatient Prescriptions  Medication Sig Dispense Refill  . acetaminophen (TYLENOL) 650 MG CR tablet Take 1,300 mg by mouth daily as needed for pain.       Marland Kitchen amLODipine (NORVASC) 10 MG tablet TAKE ONE TABLET BY MOUTH ONCE DAILY  90 tablet  1  . aspirin EC 81 MG tablet Take 81 mg by mouth daily.      Marland Kitchen atorvastatin (LIPITOR) 80 MG tablet Take 1 tablet (80 mg total) by mouth at bedtime.  30 tablet  5  . carvedilol (COREG) 25 MG tablet Take  1.5 tablets twice a day  90 tablet  6  . chlorthalidone (HYGROTON) 25 MG tablet Take 1 tablet (25 mg total) by mouth daily.  90 tablet  3  . clopidogrel (PLAVIX) 75 MG tablet TAKE ONE TABLET BY MOUTH ONCE DAILY WITH BREAKFAST  30 tablet  5  . gabapentin (NEURONTIN) 300 MG capsule Take 300 mg by mouth 3 (three) times daily as needed (pain).       Marland Kitchen glucose blood test strip 1 each by Other route 4 (four) times daily -  before meals and at bedtime.  100 each  12  . insulin aspart (NOVOLOG FLEXPEN) 100  UNIT/ML FlexPen Inject under skin 25 units before brunch, 20 units before dinner  15 mL  3  . Insulin Glargine (LANTUS SOLOSTAR) 100 UNIT/ML Solostar Pen Inject 35 Units into the skin at bedtime.  15 mL  3  . Insulin Pen Needle (PEN NEEDLES 31GX5/16") 31G X 8 MM MISC 1 each by Does not apply route 4 (four) times daily as needed.  100 each  12  . losartan (COZAAR) 100 MG tablet TAKE ONE TABLET BY MOUTH ONCE DAILY  90 tablet  1  . metFORMIN (GLUCOPHAGE) 500 MG tablet Take 1,000 mg by mouth 2 (two) times daily with a meal.      . nitroGLYCERIN (NITROSTAT) 0.4 MG SL tablet Place 1 tablet (0.4 mg total) under the tongue every 5 (five) minutes x 3 doses as needed for chest pain.  30 tablet  12  . potassium chloride SA (K-DUR,KLOR-CON) 20 MEQ tablet Take 1 tablet (20 mEq total) by mouth daily.  90 tablet  3   No current facility-administered medications for this visit.    History   Social History  . Marital Status: Single    Spouse Name: N/A    Number of Children: N/A  . Years of Education: N/A   Occupational History  . Not on file.   Social History Main Topics  . Smoking status: Never Smoker   . Smokeless tobacco: Never Used  . Alcohol Use: No  . Drug Use: No  . Sexual Activity: Not on file   Other Topics Concern  . Not on file   Social History Narrative   Regular exercise: no   Caffeine use: ocassionally    History reviewed. No pertinent family history.  ROS General: Negative; No fevers, chills, or night sweats;  HEENT: Negative; No changes in vision or hearing, sinus congestion, difficulty swallowing Pulmonary: Negative; No cough, wheezing, shortness of breath, hemoptysis Cardiovascular: See history of present illness; No chest pain, presyncope, syncope, palpitations GI: Negative; No nausea, vomiting, diarrhea, or abdominal pain GU: Negative; No dysuria, hematuria, or difficulty voiding Musculoskeletal: Negative; no myalgias, joint pain, or weakness Hematologic/Oncology:  Negative; no easy bruising, bleeding Endocrine: Positive for diabetes; no heat/cold intolerance; Neuro: Negative; no changes in balance, headaches Skin: Negative; No rashes or skin lesions Psychiatric: Negative; No behavioral problems, depression Sleep: Negative; No snoring, daytime sleepiness, hypersomnolence, bruxism, restless legs, hypnogognic hallucinations, no cataplexy Other comprehensive 14 point system review is negative.  PE BP 134/88  Pulse 73  Ht 5\' 6"  (1.676 m)  Wt 223 lb (101.152 kg)  BMI 36.01 kg/m2  General: Alert, oriented, no distress.  Skin: normal turgor, no rashes HEENT: Normocephalic, atraumatic. Pupils round and reactive; sclera anicteric;no lid lag. Extraocular muscles intact;; no xanthelasmas. Nose without nasal septal hypertrophy Mouth/Parynx benign; Mallinpatti scale 3 Neck: No JVD, no carotid bruits; normal carotid upstroke Lungs: clear to  ausculatation and percussion; no wheezing or rales Chest wall: no tenderness to palpitation Heart: RRR, s1 s2 normal; 1/6 systolic murmur; no S3 or S4 gallop no diastolic murmur, rub thrills or heaves Abdomen: soft, nontender; no hepatosplenomehaly, BS+; abdominal aorta nontender and not dilated by palpation. Back: no CVA tenderness Pulses 2+ Extremities: no clubbing cyanosis or edema, Homan's sign negative  Neurologic: grossly nonfocal; cranial nerves grossly normal. Psychologic: normal affect and mood.  ECG (independently read by me): Sinus rhythm at 73 beats per minute.  Probably a progression anteriorly, concordant with her prior MI. QTc interval 449 ms  06/30/2013 ECG today (independently read by me): Sinus rhythm at 79 beats per minute.  Poor with progression anteriorly.  Prior ECG (independently read by me): Normal sinus rhythm at 86 beats per minute anterior Q waves with her MI. QTc interval 459 ms   LABS:  BMET    Component Value Date/Time   NA 138 05/25/2013 1020   K 4.4 05/25/2013 1020   CL 97 05/25/2013  1020   CO2 32 05/25/2013 1020   GLUCOSE 166* 05/25/2013 1020   GLUCOSE 112* 02/13/2006 1112   BUN 22 05/25/2013 1020   CREATININE 1.23* 05/25/2013 1020   CREATININE 1.06 03/27/2013 0359   CALCIUM 9.6 05/25/2013 1020   GFRNONAA 59* 03/27/2013 0359   GFRAA 69* 03/27/2013 0359     Hepatic Function Panel     Component Value Date/Time   PROT 7.8 05/25/2013 1020   ALBUMIN 4.5 05/25/2013 1020   AST 16 05/25/2013 1020   ALT 12 05/25/2013 1020   ALKPHOS 57 05/25/2013 1020   BILITOT 0.5 05/25/2013 1020   BILIDIR 0.0 07/02/2010 1121     CBC    Component Value Date/Time   WBC 7.2 05/25/2013 1020   RBC 4.88 05/25/2013 1020   HGB 11.9* 05/25/2013 1020   HCT 35.6* 05/25/2013 1020   PLT 222 05/25/2013 1020   MCV 73.0* 05/25/2013 1020   MCH 24.4* 05/25/2013 1020   MCHC 33.4 05/25/2013 1020   RDW 16.5* 05/25/2013 1020   LYMPHSABS 2.5 03/23/2013 2150   MONOABS 0.9 03/23/2013 2150   EOSABS 0.1 03/23/2013 2150   BASOSABS 0.0 03/23/2013 2150     BNP No results found for this basename: probnp    Lipid Panel     Component Value Date/Time   CHOL 150 05/25/2013 1020   TRIG 68 05/25/2013 1020   HDL 50 05/25/2013 1020   CHOLHDL 3.0 05/25/2013 1020   VLDL 14 05/25/2013 1020   LDLCALC 86 05/25/2013 1020     RADIOLOGY: No results found.    ASSESSMENT AND PLAN: Lynn Hobbs suffered an ST segment elevation myocardial infarction and presented urgently to the catheterization laboratory in the morning of 03/24/2013. She was found to have subtotal 99% LAD stenosis with TIMI one half flow initially.  Over the past 8 months, her medications have been titrated to her current levels and her LV function has improved following revascularization, and maximal medical therapy.  Her blood pressure today is stable on amlodipine 10 mg, chlorthalidone, 25 mg, carvedilol, 37.5 mg twice a day and losartan 100 mg. Her lipid studies were significantly improved with the addition of atorvastatin 80 mg to her medical regimen.  She is  on insulin for diabetes.  She is unaware of any palpitations.  She is no longer wearing her life.  This.  She will continue current therapy.  I will see her in 6 months for followup evaluation or sooner if  problems arise.  Troy Sine, MD, [facility]  11/03/2013 5:07 PM

## 2013-11-29 ENCOUNTER — Other Ambulatory Visit: Payer: Self-pay | Admitting: Internal Medicine

## 2013-11-30 ENCOUNTER — Ambulatory Visit (INDEPENDENT_AMBULATORY_CARE_PROVIDER_SITE_OTHER): Payer: Medicare Other | Admitting: Internal Medicine

## 2013-11-30 ENCOUNTER — Encounter: Payer: Self-pay | Admitting: Internal Medicine

## 2013-11-30 ENCOUNTER — Ambulatory Visit: Payer: Medicare Other | Admitting: Internal Medicine

## 2013-11-30 VITALS — BP 118/68 | HR 72 | Temp 97.6°F | Resp 12 | Wt 227.0 lb

## 2013-11-30 DIAGNOSIS — E1142 Type 2 diabetes mellitus with diabetic polyneuropathy: Secondary | ICD-10-CM

## 2013-11-30 DIAGNOSIS — I2510 Atherosclerotic heart disease of native coronary artery without angina pectoris: Secondary | ICD-10-CM | POA: Diagnosis not present

## 2013-11-30 DIAGNOSIS — E1149 Type 2 diabetes mellitus with other diabetic neurological complication: Secondary | ICD-10-CM

## 2013-11-30 LAB — HEMOGLOBIN A1C: Hgb A1c MFr Bld: 10.4 % — ABNORMAL HIGH (ref 4.6–6.5)

## 2013-11-30 NOTE — Patient Instructions (Signed)
-   Please continue Metformin 1000 mg 2x a day - Continue Lantus to 35 units at night - Continue Novolog 25 in am and 20 before dinner - Add 7 units of NovoLog before popcorn. If you change your snack so that it contains <15 g of carbs >> no NovoLog needed.  Please come back in 3 mo.  Please stop at the lab.

## 2013-11-30 NOTE — Progress Notes (Signed)
Patient ID: Lynn Hobbs, female   DOB: [birth year], 53 y.o.   MRN: [id number]  HPI: Lynn Hobbs is a 53 y.o.-year-old female, returning for f/u of DM2, dx 2004, insulin-dependent, uncontrolled, with complications (?peripheral neuropathy, STEMI 03/2013, cerebrovascular ds, DR). She is here with her mother, who is also diabetic, and offers part of the history. Last visit 2 mo ago.   Last hemoglobin A1c was: Lab Results  Component Value Date   HGBA1C 9.5* 08/30/2013   HGBA1C 8.0* 05/25/2013   HGBA1C 8.8* 03/08/2013   She is on: - Metformin 1000 mg 2x a day - Lantus 30 units at night - Novolog 25 in am and 20 before dinner We stopped Invokana 100 mg daily >> urinating a lot at night >> impaired sleep.  Pt checks her sugars 7x a day and they are (per log) a little higher, especially midday, after her popcorn + apple snack (no insulin with it): - am: 102-145 >> 64-146 (one 160) >> 57-128 (most 90-100) >> 49-124 >> 114-185 >>106-142, 153, 180 - 2h after b'fast: 91-200 (130-150) >> 101, 95 >> 102-150 >> 49x1, 66-153 >> 111, 135-172, 206 - before lunch: 43-140 >> n/c >> 125-130 >> 104-178 (sometimes eats popcorn before this meal) >> 128-174 - 2h after lunch: 89-168 >> 145-192 - before dinner: 139, 140, 229 ( 229 x 1, after a snack) >> 110, 128 >> 125, 130 >> 83-148, 175x1 >> 127-167 - 2h after dinner: 96, 108 >> 140 >> n/c >> 84 >> 159-196 - bedtime: 102, 113, last night 47 >> 49, 60, 62, 77, 101 >> n/c >> 75-139 >> 87-125 - nighttime: 54 x 1 >> n/c Lowest sugar was 74; she has hypoglycemia awareness at 60. Highest sugar was 206 in last month.  Meals: - b'fast ~ 10 am - dinner ~2:30-3 pm Afterthis, she eats very light, usually fruit.  - mild CKD, last BUN/creatinine:  Lab Results  Component Value Date   BUN 22 05/25/2013   CREATININE 1.23* 05/25/2013  Last ACR high (12/02/2012): 53.8, previously 64.6. She is on Losartan (changed from Olmesartan). - last set of lipids: Lab Results   Component Value Date   CHOL 150 05/25/2013   HDL 50 05/25/2013   LDLCALC 86 05/25/2013   LDLDIRECT 214.4 12/02/2012   TRIG 68 05/25/2013   CHOLHDL 3.0 05/25/2013  she is on  Atorvastatin 80. - last eye exam was in 06/2013 - Dr. Tempie Hoist. 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 Macular edema - per pt description She goes for a new one in 12/28/2013.  - no numbness and tingling in her feet. She is on aspirin 81 mg.  She has a history of HTN, HL. She had a STEMI  (03/2013) >> midLAD stent placed. She was started on Plavix and beta blocker. Cardiologist: Dr Claiborne Billings.  ROS: Constitutional: no weight loss/gain, + fatigue, no subjective hyperthermia/hypothermia Eyes: no blurry vision, no xerophthalmia ENT: no sore throat, no nodules palpated in throat, no dysphagia/odynophagia, no hoarseness Cardiovascular: no CP/SOB/palpitations/leg swelling Respiratory: no cough/SOB Gastrointestinal: no N/V/D/C Musculoskeletal: no muscle/joint aches Skin: no rashes  I reviewed pt's medications, allergies, PMH, social hx, family hx and no changes required.  PE: BP 118/68  Pulse 72  Temp(Src) 97.6 F (36.4 C) (Oral)  Resp 12  Wt 227 lb (102.967 kg)  SpO2 99% Wt Readings from Last 3 Encounters:  11/30/13 227 lb (102.967 kg)  11/02/13 223 lb (101.152 kg)  09/29/13 225 lb (102.059 kg)   Constitutional: overweight, in  NAD, uses cane Eyes: PERRLA, EOMI, no exophthalmos ENT: moist mucous membranes, no thyromegaly, no cervical lymphadenopathy Cardiovascular: RRR, No MRG Respiratory: CTA B Gastrointestinal: abdomen soft, NT, ND, BS+ Musculoskeletal: left arm and leg paresis Skin: moist, warm, no rashes Neurological: no tremor with outstretched hands, DTR normal in all 4  ASSESSMENT: 1. DM2, insulin-dependent, uncontrolled, with complications - ? PN - on Neurontin - cerebrovascular ds. 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 Patient with long-standing, recently more  uncontrolled diabetes, but with improved control since we adjusted the basal-bolus insulin regimen and she started to improve her diet, and especially after her cardiac stent. She still has higher sugars after popcorn midday - I suggested to :  Patient Instructions  - Please continue Metformin 1000 mg 2x a day - Continue Lantus to 35 units at night - Continue Novolog 25 in am and 20 before dinner - Add 7 units of NovoLog before popcorn. If you change your snack so that it contains <15 g of carbs >> no NovoLog needed. Please come back in 3 mo. Please stop at the lab. - continue checking her sugars at different times of the day - check  3-4x a day - given new sugar logs - she is up to date with eye exams - will check HbA1c today - given examples of less sugary snacks - refuses flu vaccine >> makes her feel sick - Return to clinic in 3 months with sugar log  Sugars again contradicting the HbA1c >> need fructosamine at next check along with HbA1c. Will continue the plan as above.

## 2013-12-19 ENCOUNTER — Other Ambulatory Visit: Payer: Self-pay | Admitting: Internal Medicine

## 2013-12-28 ENCOUNTER — Ambulatory Visit (INDEPENDENT_AMBULATORY_CARE_PROVIDER_SITE_OTHER): Payer: Medicare Other | Admitting: Ophthalmology

## 2013-12-28 DIAGNOSIS — I10 Essential (primary) hypertension: Secondary | ICD-10-CM | POA: Diagnosis not present

## 2013-12-28 DIAGNOSIS — E10359 Type 1 diabetes mellitus with proliferative diabetic retinopathy without macular edema: Secondary | ICD-10-CM

## 2013-12-28 DIAGNOSIS — E10351 Type 1 diabetes mellitus with proliferative diabetic retinopathy with macular edema: Secondary | ICD-10-CM

## 2013-12-28 DIAGNOSIS — E10311 Type 1 diabetes mellitus with unspecified diabetic retinopathy with macular edema: Secondary | ICD-10-CM

## 2013-12-28 DIAGNOSIS — H2513 Age-related nuclear cataract, bilateral: Secondary | ICD-10-CM

## 2013-12-28 DIAGNOSIS — H35033 Hypertensive retinopathy, bilateral: Secondary | ICD-10-CM

## 2013-12-28 DIAGNOSIS — H43813 Vitreous degeneration, bilateral: Secondary | ICD-10-CM

## 2013-12-28 LAB — HM DIABETES EYE EXAM

## 2013-12-30 ENCOUNTER — Encounter: Payer: Self-pay | Admitting: Internal Medicine

## 2014-01-18 ENCOUNTER — Other Ambulatory Visit: Payer: Self-pay | Admitting: Internal Medicine

## 2014-02-16 ENCOUNTER — Encounter (HOSPITAL_COMMUNITY): Payer: Self-pay | Admitting: Cardiovascular Disease

## 2014-02-19 ENCOUNTER — Other Ambulatory Visit: Payer: Self-pay | Admitting: Cardiology

## 2014-02-22 ENCOUNTER — Other Ambulatory Visit: Payer: Self-pay | Admitting: Cardiovascular Disease

## 2014-02-22 ENCOUNTER — Telehealth: Payer: Self-pay | Admitting: Cardiovascular Disease

## 2014-02-22 NOTE — Telephone Encounter (Signed)
Patient at the pharmacy for a refill on her coreg.  Pharmacist requesting verbal OK.  Just e-scribed several minute before the call.  Pharmacist sees the authorization.  Refill will be taken care of.

## 2014-03-01 ENCOUNTER — Encounter: Payer: Self-pay | Admitting: Internal Medicine

## 2014-03-01 ENCOUNTER — Ambulatory Visit (INDEPENDENT_AMBULATORY_CARE_PROVIDER_SITE_OTHER): Payer: Medicare Other | Admitting: Internal Medicine

## 2014-03-01 VITALS — BP 122/72 | HR 76 | Temp 98.3°F | Resp 12 | Wt 229.0 lb

## 2014-03-01 DIAGNOSIS — E1142 Type 2 diabetes mellitus with diabetic polyneuropathy: Secondary | ICD-10-CM

## 2014-03-01 DIAGNOSIS — I2510 Atherosclerotic heart disease of native coronary artery without angina pectoris: Secondary | ICD-10-CM | POA: Diagnosis not present

## 2014-03-01 LAB — HEMOGLOBIN A1C: Hgb A1c MFr Bld: 11 % — ABNORMAL HIGH (ref 4.6–6.5)

## 2014-03-01 NOTE — Patient Instructions (Signed)
-   Please continue Metformin 1000 mg 2x a day - Increase Lantus to 35 units at night.  - Continue Novolog 25 in am and 20 before dinner - Continue adding 7 units of NovoLog before popcorn. If you change your snack so that it contains <15 g of carbs >> no NovoLog needed. Please come back in 3 mo. Please stop at the lab.

## 2014-03-01 NOTE — Progress Notes (Signed)
Patient ID: Lynn Hobbs, female   DOB: September 11, 1960, 53 y.o.   MRN: [id number]  HPI: Lynn Hobbs is a 53 y.o.-year-old female, returning for f/u of DM2, dx 2004, insulin-dependent, uncontrolled, with complications (?peripheral neuropathy, STEMI 03/2013, cerebrovascular ds, DR). She is here with her mother, who is also diabetic, and offers part of the history. Last visit 3 mo ago.   Last hemoglobin A1c was: Lab Results  Component Value Date   HGBA1C 10.4* 11/30/2013   HGBA1C 9.5* 08/30/2013   HGBA1C 8.0* 05/25/2013   She is on: - Metformin 1000 mg 2x a day - Lantus 30 units at night - Novolog 25 in am and 20 before dinner - 7 units of NovoLog with snacks >15 g of carbs We stopped Invokana 100 mg daily >> urinating a lot at night >> impaired sleep.  Pt checks her sugars 7x a day and they are (per log) a little higher, especially midday, after her popcorn + apple snack (no insulin with it): - am: 102-145 >> 64-146 (one 160) >> 57-128 (most 90-100) >> 49-124 >> 114-185 >>106-142, 153, 180 >> sleeps - 2h after b'fast: 91-200 (130-150) >> 101, 95 >> 102-150 >> 49x1, 66-153 >> 111, 135-172, 206 >> sleeps - before lunch: 43-140 >> n/c >> 125-130 >> 104-178 (sometimes eats popcorn before this meal) >> 128-174 >> 126-178, 199 - 2h after lunch: 89-168 >> 145-192 >> 87-181 - before dinner: 139, 140, 229 ( 229 x 1, after a snack) >> 110, 128 >> 125, 130 >> 83-148, 175x1 >> 127-167 >> 137-188, 200 - 2h after dinner: 96, 108 >> 140 >> n/c >> 84 >> 159-196 >> 203 - bedtime: 102, 113, last night 47 >> 49, 60, 62, 77, 101 >> n/c >> 75-139 >> 87-125 >> 87-137 - nighttime: 54 x 1 >> n/c Lowest sugar was 74; she has hypoglycemia awareness at 60. Highest sugar was 206 in last month.  Meals: - b'fast ~ 10 am - dinner ~2:30-3 pm Afterthis, she eats very light, usually fruit.  - mild CKD, last BUN/creatinine:  Lab Results  Component Value Date   BUN 22 05/25/2013   CREATININE 1.23* 05/25/2013   Last ACR high (12/02/2012): 53.8, previously 64.6. She is on Losartan (changed from Olmesartan). - last set of lipids: Lab Results  Component Value Date   CHOL 150 05/25/2013   HDL 50 05/25/2013   LDLCALC 86 05/25/2013   LDLDIRECT 214.4 12/02/2012   TRIG 68 05/25/2013   CHOLHDL 3.0 05/25/2013  she is on  Atorvastatin 80. - last eye exam was in 12/28/2013 - Dr. Tempie Hoist. 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 Macular edema - per pt description. She had Laser Sx. - no numbness and tingling in her feet. She is on aspirin 81 mg.  She has a history of HTN, HL. She had a STEMI  (03/2013) >> midLAD stent placed. She was started on Plavix and beta blocker. Cardiologist: Dr Claiborne Billings.  ROS: Constitutional: no weight loss/gain, no fatigue, no subjective hyperthermia/hypothermia Eyes: no blurry vision, no xerophthalmia ENT: no sore throat, no nodules palpated in throat, no dysphagia/odynophagia, no hoarseness Cardiovascular: no CP/SOB/palpitations/leg swelling Respiratory: no cough/SOB Gastrointestinal: no N/V/D/C Musculoskeletal: no muscle/joint aches Skin: no rashes  I reviewed pt's medications, allergies, PMH, social hx, family hx and no changes required.  PE: BP 122/72 mmHg  Pulse 76  Temp(Src) 98.3 F (36.8 C) (Oral)  Resp 12  Wt 229 lb (103.874 kg)  SpO2 98% Wt Readings from Last 3 Encounters:  03/01/14 229 lb (103.874 kg)  11/30/13 227 lb (102.967 kg)  11/02/13 223 lb (101.152 kg)   Constitutional: overweight, in NAD, uses cane Eyes: PERRLA, EOMI, no exophthalmos ENT: moist mucous membranes, no thyromegaly, no cervical lymphadenopathy Cardiovascular: RRR, No MRG Respiratory: CTA B Gastrointestinal: abdomen soft, NT, ND, BS+ Musculoskeletal: left arm and leg paresis Skin: moist, warm, no rashes Neurological: no tremor with outstretched hands, DTR normal in all 4  ASSESSMENT: 1. DM2, insulin-dependent, uncontrolled, with complications - ? PN - on Neurontin - cerebrovascular ds. 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 Patient with long-standing, recently more uncontrolled diabetes, with still high sugars in the first part of the day.  - I suggested to :  Patient Instructions  - Please continue Metformin 1000 mg 2x a day - Increase Lantus to 35 units at night.  - Continue Novolog 25 in am and 20 before dinner - Continue adding 7 units of NovoLog before popcorn. If you change your snack so that it contains <15 g of carbs >> no NovoLog needed. Please come back in 3 mo. Please stop at the lab.  - continue checking her sugars at different times of the day - check  3-4x a day - given new sugar logs - she is up to date with eye exams - will check HbA1c + fructosamine today - refuses flu vaccine >> makes her feel sick - Return to clinic in 3 months with sugar log  Office Visit on 03/01/2014  Component Date Value Ref Range Status  . Fructosamine 03/01/2014 456* 190 - 270 umol/L Final  . Hgb A1c MFr Bld 03/01/2014 11.0* 4.6 - 6.5 % Final   Glycemic Control Guidelines for People with Diabetes:Non Diabetic:  <6%Goal of Therapy: <7%Additional Action Suggested:  >8%     HbA1c high, but not as high as calculated from the fructosamine: 9.36%. See plan above.

## 2014-03-06 LAB — FRUCTOSAMINE: FRUCTOSAMINE: 456 umol/L — AB (ref 190–270)

## 2014-03-21 ENCOUNTER — Other Ambulatory Visit: Payer: Self-pay | Admitting: Internal Medicine

## 2014-03-29 ENCOUNTER — Ambulatory Visit (INDEPENDENT_AMBULATORY_CARE_PROVIDER_SITE_OTHER): Payer: Medicare Other | Admitting: Internal Medicine

## 2014-03-29 ENCOUNTER — Encounter: Payer: Self-pay | Admitting: Internal Medicine

## 2014-03-29 VITALS — BP 160/90 | HR 82 | Temp 98.0°F | Resp 20 | Ht 66.0 in | Wt 233.0 lb

## 2014-03-29 DIAGNOSIS — I10 Essential (primary) hypertension: Secondary | ICD-10-CM | POA: Diagnosis not present

## 2014-03-29 DIAGNOSIS — G609 Hereditary and idiopathic neuropathy, unspecified: Secondary | ICD-10-CM | POA: Diagnosis not present

## 2014-03-29 DIAGNOSIS — I679 Cerebrovascular disease, unspecified: Secondary | ICD-10-CM

## 2014-03-29 DIAGNOSIS — E0840 Diabetes mellitus due to underlying condition with diabetic neuropathy, unspecified: Secondary | ICD-10-CM | POA: Diagnosis not present

## 2014-03-29 NOTE — Patient Instructions (Signed)
Limit your sodium (Salt) intake   Please check your hemoglobin A1c every 3 months  You need to lose weight.  Consider a lower calorie diet and regular exercise. 

## 2014-03-29 NOTE — Progress Notes (Signed)
Subjective:    Patient ID: Lynn Hobbs, female    DOB: 1960-10-30, 54 y.o.   MRN: [id number]  HPI 54 year old patient who is seen today for follow-up.  She has poorly controlled diabetes and is followed by endocrine. She has essential hypertension but has not taken her medications today.  Blood pressure elevated at 160 over 80 She has cerebrovascular disease as well as peripheral diabetic neuropathy.  She complains of leg weakness, unsteadiness and has become more fearful of falling.  She now uses a cane much more regularly Her cardiac status has been stable  Past Medical History  Diagnosis Date  . BENIGN NEOPLASM OF SKIN SITE UNSPECIFIED 09/08/2008  . CEREBROVASCULAR DISEASE 12/11/2008  . DIABETES MELLITUS, TYPE II 10/12/2006  . HYPERLIPIDEMIA 10/12/2006  . HYPERTENSION 10/12/2006  . PERIPHERAL NEUROPATHY 10/21/2006  . Endometriosis   . Fibroids     History   Social History  . Marital Status: Single    Spouse Name: N/A    Number of Children: N/A  . Years of Education: N/A   Occupational History  . Not on file.   Social History Main Topics  . Smoking status: Never Smoker   . Smokeless tobacco: Never Used  . Alcohol Use: No  . Drug Use: No  . Sexual Activity: Not on file   Other Topics Concern  . Not on file   Social History Narrative   Regular exercise: no   Caffeine use: ocassionally    Past Surgical History  Procedure Laterality Date  . Abdominal hysterectomy    . Left heart catheterization with coronary angiogram N/A 03/24/2013    Procedure: LEFT HEART CATHETERIZATION WITH CORONARY ANGIOGRAM;  Surgeon: Troy Sine, MD;  Location: [facility] CATH LAB;  Service: Cardiovascular;  Laterality: N/A;    No family history on file.  No Known Allergies  Current Outpatient Prescriptions on File Prior to Visit  Medication Sig Dispense Refill  . acetaminophen (TYLENOL) 650 MG CR tablet Take 1,300 mg by mouth daily as needed for pain.     Marland Kitchen amLODipine (NORVASC) 10 MG tablet  TAKE ONE TABLET BY MOUTH ONCE DAILY 90 tablet 1  . aspirin EC 81 MG tablet Take 81 mg by mouth daily.    Marland Kitchen atorvastatin (LIPITOR) 80 MG tablet TAKE ONE TABLET BY MOUTH AT BEDTIME 30 tablet 3  . carvedilol (COREG) 25 MG tablet TAKE ONE & ONE-HALF TABLETS BY MOUTH TWICE DAILY 90 tablet 5  . chlorthalidone (HYGROTON) 25 MG tablet TAKE ONE TABLET BY MOUTH ONCE DAILY 90 tablet 1  . clopidogrel (PLAVIX) 75 MG tablet TAKE ONE TABLET BY MOUTH ONCE DAILY WITH BREAKFAST 30 tablet 5  . gabapentin (NEURONTIN) 300 MG capsule Take 300 mg by mouth 3 (three) times daily as needed (pain).     Marland Kitchen glucose blood test strip 1 each by Other route 4 (four) times daily -  before meals and at bedtime. 100 each 12  . insulin aspart (NOVOLOG FLEXPEN) 100 UNIT/ML FlexPen Inject under skin 25 units before brunch, 20 units before dinner 15 mL 3  . Insulin Glargine (LANTUS SOLOSTAR) 100 UNIT/ML Solostar Pen Inject 35 Units into the skin at bedtime. 15 mL 3  . Insulin Pen Needle (PEN NEEDLES 31GX5/16") 31G X 8 MM MISC 1 each by Does not apply route 4 (four) times daily as needed. 100 each 12  . losartan (COZAAR) 100 MG tablet TAKE ONE TABLET BY MOUTH ONCE DAILY 90 tablet 1  . metFORMIN (GLUCOPHAGE) 500 MG  tablet Take 1,000 mg by mouth 2 (two) times daily with a meal.    . nitroGLYCERIN (NITROSTAT) 0.4 MG SL tablet Place 1 tablet (0.4 mg total) under the tongue every 5 (five) minutes x 3 doses as needed for chest pain. 30 tablet 12  . NOVOLOG FLEXPEN 100 UNIT/ML FlexPen INJECT 30 UNITS SUBCUTANEOUSLY THREE TIMES DAILY PRIOR TO EACH MEAL. IF BLOOD SUGAR IS IN EXCESS OF 200 ADD AN ADDITIONAL 10 UNITS. 45 pen 1  . potassium chloride SA (K-DUR,KLOR-CON) 20 MEQ tablet Take 1 tablet (20 mEq total) by mouth daily. 90 tablet 3   No current facility-administered medications on file prior to visit.    BP 160/90 mmHg  Pulse 82  Temp(Src) 98 F (36.7 C) (Oral)  Resp 20  Ht 5\' 6"  (1.676 m)  Wt 233 lb (105.688 kg)  BMI 37.63 kg/m2   SpO2 99%      Review of Systems  HENT: Negative for congestion, dental problem, hearing loss, rhinorrhea, sinus pressure, sore throat and tinnitus.   Eyes: Negative for pain, discharge and visual disturbance.  Respiratory: Negative for cough and shortness of breath.   Cardiovascular: Negative for chest pain, palpitations and leg swelling.  Gastrointestinal: Negative for nausea, vomiting, abdominal pain, diarrhea, constipation, blood in stool and abdominal distention.  Genitourinary: Negative for dysuria, urgency, frequency, hematuria, flank pain, vaginal bleeding, vaginal discharge, difficulty urinating, vaginal pain and pelvic pain.  Musculoskeletal: Positive for gait problem. Negative for joint swelling and arthralgias.  Skin: Negative for rash.  Neurological: Positive for weakness. Negative for dizziness, syncope, speech difficulty, numbness and headaches.  Hematological: Negative for adenopathy.  Psychiatric/Behavioral: Negative for behavioral problems, dysphoric mood and agitation. The patient is not nervous/anxious.        Objective:   Physical Exam  Constitutional: She is oriented to person, place, and time. She appears well-developed and well-nourished.  Blood pressure 160/80 right arm  HENT:  Head: Normocephalic.  Right Ear: External ear normal.  Left Ear: External ear normal.  Mouth/Throat: Oropharynx is clear and moist.  Eyes: Conjunctivae and EOM are normal. Pupils are equal, round, and reactive to light.  Neck: Normal range of motion. Neck supple. No thyromegaly present.  Cardiovascular: Normal rate, regular rhythm, normal heart sounds and intact distal pulses.   Pulmonary/Chest: Effort normal and breath sounds normal.  Abdominal: Soft. Bowel sounds are normal. She exhibits no mass. There is no tenderness.  Musculoskeletal: Normal range of motion.  Lymphadenopathy:    She has no cervical adenopathy.  Neurological: She is alert and oriented to person, place, and  time.  Left-sided weakness  Walks with cane  Skin: Skin is warm and dry. No rash noted.  Psychiatric: She has a normal mood and affect. Her behavior is normal.          Assessment & Plan:   Hypertension.  Suboptimal control.  Patient has not taken her medications this a.m.  Compliance stressed Diabetic peripheral neuropathy Cerebrovascular disease Unsteady gait, multifactorial Coronary artery disease, stable   Follow-up.  Endocrine Recheck here 3 months

## 2014-03-29 NOTE — Progress Notes (Signed)
Pre visit review using our clinic review tool, if applicable. No additional management support is needed unless otherwise documented below in the visit note. 

## 2014-04-14 ENCOUNTER — Encounter (HOSPITAL_COMMUNITY): Payer: Self-pay | Admitting: Emergency Medicine

## 2014-04-14 ENCOUNTER — Telehealth: Payer: Self-pay | Admitting: Internal Medicine

## 2014-04-14 ENCOUNTER — Emergency Department (HOSPITAL_COMMUNITY): Payer: Medicare Other

## 2014-04-14 ENCOUNTER — Emergency Department (HOSPITAL_COMMUNITY)
Admission: EM | Admit: 2014-04-14 | Discharge: 2014-04-14 | Disposition: A | Discharge status: Home / Self Care | Payer: Medicare Other | Attending: Emergency Medicine | Admitting: Emergency Medicine

## 2014-04-14 DIAGNOSIS — Z9889 Other specified postprocedural states: Secondary | ICD-10-CM | POA: Diagnosis not present

## 2014-04-14 DIAGNOSIS — E785 Hyperlipidemia, unspecified: Secondary | ICD-10-CM | POA: Insufficient documentation

## 2014-04-14 DIAGNOSIS — Z7902 Long term (current) use of antithrombotics/antiplatelets: Secondary | ICD-10-CM | POA: Insufficient documentation

## 2014-04-14 DIAGNOSIS — Z8673 Personal history of transient ischemic attack (TIA), and cerebral infarction without residual deficits: Secondary | ICD-10-CM | POA: Diagnosis not present

## 2014-04-14 DIAGNOSIS — M6281 Muscle weakness (generalized): Secondary | ICD-10-CM | POA: Diagnosis not present

## 2014-04-14 DIAGNOSIS — Z8742 Personal history of other diseases of the female genital tract: Secondary | ICD-10-CM | POA: Diagnosis not present

## 2014-04-14 DIAGNOSIS — I10 Essential (primary) hypertension: Secondary | ICD-10-CM | POA: Insufficient documentation

## 2014-04-14 DIAGNOSIS — R531 Weakness: Secondary | ICD-10-CM | POA: Diagnosis not present

## 2014-04-14 DIAGNOSIS — R29898 Other symptoms and signs involving the musculoskeletal system: Secondary | ICD-10-CM

## 2014-04-14 DIAGNOSIS — Z794 Long term (current) use of insulin: Secondary | ICD-10-CM | POA: Diagnosis not present

## 2014-04-14 DIAGNOSIS — Z85828 Personal history of other malignant neoplasm of skin: Secondary | ICD-10-CM | POA: Insufficient documentation

## 2014-04-14 DIAGNOSIS — Z79899 Other long term (current) drug therapy: Secondary | ICD-10-CM | POA: Insufficient documentation

## 2014-04-14 DIAGNOSIS — Z8669 Personal history of other diseases of the nervous system and sense organs: Secondary | ICD-10-CM | POA: Diagnosis not present

## 2014-04-14 DIAGNOSIS — E119 Type 2 diabetes mellitus without complications: Secondary | ICD-10-CM | POA: Insufficient documentation

## 2014-04-14 DIAGNOSIS — Z7982 Long term (current) use of aspirin: Secondary | ICD-10-CM | POA: Insufficient documentation

## 2014-04-14 DIAGNOSIS — R200 Anesthesia of skin: Secondary | ICD-10-CM | POA: Diagnosis not present

## 2014-04-14 DIAGNOSIS — M79605 Pain in left leg: Secondary | ICD-10-CM | POA: Diagnosis not present

## 2014-04-14 LAB — I-STAT CHEM 8, ED
BUN: 27 mg/dL — AB (ref 6–23)
CALCIUM ION: 1.15 mmol/L (ref 1.12–1.23)
Chloride: 100 mmol/L (ref 96–112)
Creatinine, Ser: 1.2 mg/dL — ABNORMAL HIGH (ref 0.50–1.10)
Glucose, Bld: 215 mg/dL — ABNORMAL HIGH (ref 70–99)
HCT: 39 % (ref 36.0–46.0)
HEMOGLOBIN: 13.3 g/dL (ref 12.0–15.0)
Potassium: 4 mmol/L (ref 3.5–5.1)
Sodium: 139 mmol/L (ref 135–145)
TCO2: 24 mmol/L (ref 0–100)

## 2014-04-14 NOTE — ED Notes (Signed)
Pt arrived from home by [facility] with c/o left leg heaviness and numbness. Pt has hx of strokes in the past with left side weakness. Pt stated that the feeling in her left leg has been going on since the end of January but this morning pt noticed heaviness was worse. Pt also stated that she feels weak about 30 mins after she takes her carvedilol. Denies taking BP medication this morning.

## 2014-04-14 NOTE — Telephone Encounter (Signed)
appt scheduled for today

## 2014-04-14 NOTE — Discharge Instructions (Signed)
Take your Norvasc at bedtime rather than in the morning.

## 2014-04-14 NOTE — ED Provider Notes (Signed)
CSN: [id number]     Arrival date & time 04/14/14  1103 History   First MD Initiated Contact with Patient 04/14/14 1152     Chief Complaint  Patient presents with  . Extremity Weakness      HPI  Expand All Collapse All   Pt arrived from home by [facility] with c/o left leg heaviness and numbness. Pt has hx of strokes in the past with left side weakness. Pt stated that the feeling in her left leg has been going on since the end of January but this morning pt noticed heaviness was worse. Pt also stated that she feels weak about 30 mins after she takes her carvedilol. Denies taking BP medication this morning.      Past Medical History  Diagnosis Date  . BENIGN NEOPLASM OF SKIN SITE UNSPECIFIED 09/08/2008  . CEREBROVASCULAR DISEASE 12/11/2008  . DIABETES MELLITUS, TYPE II 10/12/2006  . HYPERLIPIDEMIA 10/12/2006  . HYPERTENSION 10/12/2006  . PERIPHERAL NEUROPATHY 10/21/2006  . Endometriosis   . Fibroids    Past Surgical History  Procedure Laterality Date  . Abdominal hysterectomy    . Left heart catheterization with coronary angiogram N/A 03/24/2013    Procedure: LEFT HEART CATHETERIZATION WITH CORONARY ANGIOGRAM;  Surgeon: Troy Sine, MD;  Location: [facility] CATH LAB;  Service: Cardiovascular;  Laterality: N/A;   No family history on file. History  Substance Use Topics  . Smoking status: Never Smoker   . Smokeless tobacco: Never Used  . Alcohol Use: No   OB History    No data available     Review of Systems  All other systems reviewed and are negative  Allergies  Review of patient's allergies indicates no known allergies.  Home Medications   Prior to Admission medications   Medication Sig Start Date End Date Taking? Authorizing Provider  acetaminophen (TYLENOL) 650 MG CR tablet Take 1,300 mg by mouth daily as needed for pain.    Yes Historical Provider, MD  amLODipine (NORVASC) 10 MG tablet TAKE ONE TABLET BY MOUTH ONCE DAILY 03/22/14  Yes Marletta Lor, MD  aspirin EC 81 MG  tablet Take 81 mg by mouth daily.   Yes Historical Provider, MD  atorvastatin (LIPITOR) 80 MG tablet TAKE ONE TABLET BY MOUTH AT BEDTIME 02/20/14  Yes Troy Sine, MD  carvedilol (COREG) 25 MG tablet TAKE ONE & ONE-HALF TABLETS BY MOUTH TWICE DAILY 02/22/14  Yes Troy Sine, MD  chlorthalidone (HYGROTON) 25 MG tablet TAKE ONE TABLET BY MOUTH ONCE DAILY 01/18/14  Yes Marletta Lor, MD  clopidogrel (PLAVIX) 75 MG tablet TAKE ONE TABLET BY MOUTH ONCE DAILY WITH BREAKFAST 10/19/13  Yes Troy Sine, MD  glucose blood test strip 1 each by Other route 4 (four) times daily -  before meals and at bedtime. 09/26/13  Yes Marletta Lor, MD  insulin aspart (NOVOLOG FLEXPEN) 100 UNIT/ML FlexPen Inject under skin 25 units before brunch, 20 units before dinner 08/30/13  Yes Philemon Kingdom, MD  Insulin Glargine (LANTUS SOLOSTAR) 100 UNIT/ML Solostar Pen Inject 35 Units into the skin at bedtime. 09/29/13  Yes Philemon Kingdom, MD  Insulin Pen Needle (PEN NEEDLES 31GX5/16") 31G X 8 MM MISC 1 each by Does not apply route 4 (four) times daily as needed. 07/04/13  Yes Marletta Lor, MD  losartan (COZAAR) 100 MG tablet TAKE ONE TABLET BY MOUTH ONCE DAILY 12/21/13  Yes Marletta Lor, MD  metFORMIN (GLUCOPHAGE) 500 MG tablet Take 1,000 mg by mouth  2 (two) times daily with a meal.   Yes Historical Provider, MD  nitroGLYCERIN (NITROSTAT) 0.4 MG SL tablet Place 1 tablet (0.4 mg total) under the tongue every 5 (five) minutes x 3 doses as needed for chest pain. 03/27/13  Yes Lendon Colonel, NP  potassium chloride SA (K-DUR,KLOR-CON) 20 MEQ tablet Take 1 tablet (20 mEq total) by mouth daily. 09/26/13  Yes Marletta Lor, MD   BP 134/68 mmHg  Pulse 74  Temp(Src) 98.2 F (36.8 C) (Oral)  Resp 14  SpO2 100% Physical Exam  Constitutional: She is oriented to person, place, and time. She appears well-developed and well-nourished. No distress.  HENT:  Head: Normocephalic and atraumatic.  Eyes:  Pupils are equal, round, and reactive to light.  Neck: Normal range of motion.  Cardiovascular: Normal rate and intact distal pulses.   Pulmonary/Chest: No respiratory distress.  Abdominal: Normal appearance. She exhibits no distension.  Musculoskeletal: Normal range of motion.  Neurological: She is alert and oriented to person, place, and time. No cranial nerve deficit. GCS eye subscore is 4. GCS verbal subscore is 5. GCS motor subscore is 6.  Patient has some left arm weakness that is from her old stroke and is nothing new.  No definite weakness noted to left leg.  Skin: Skin is warm and dry. No rash noted.  Psychiatric: She has a normal mood and affect. Her behavior is normal.  Nursing note and vitals reviewed.   ED Course  Procedures (including critical care time) Labs Review Labs Reviewed  I-STAT CHEM 8, ED - Abnormal; Notable for the following:    BUN 27 (*)    Creatinine, Ser 1.20 (*)    Glucose, Bld 215 (*)    All other components within normal limits    Imaging Review No results found.     MR Brain Wo Contrast (Final result) Result time: 04/14/14 16:34:31   Final result by Rad Results In Interface (04/14/14 16:34:31)   Narrative:   CLINICAL DATA: Left leg heaviness/weakness and numbness. Past history of strokes with left-sided weakness.  EXAM: MRI HEAD WITHOUT CONTRAST  TECHNIQUE: Multiplanar, multiecho pulse sequences of the brain and surrounding structures were obtained without intravenous contrast.  COMPARISON: Head CT 04/02/2012 and MRI 06/03/2005  FINDINGS: There is no evidence of acute infarct, mass, midline shift, or extra-axial fluid collection. Chronic hemorrhagic infarct is again seen in the right basal ganglia with associated ex vacuo dilatation of the right lateral ventricle. Chronic right thalamic infarct is also noted. There is associated wallerian degeneration along the right corticospinal tract.  There is a moderate-sized, chronic  infarct in the inferior left cerebellum, new from prior MRI. Gliosis in the right centrum semiovale has increased from the prior study. Periventricular white matter T2 hyperintensities in the left cerebral hemisphere have also slightly increased nad are suggestive of chronic small vessel ischemic disease. There is mild global cerebral atrophy.  Orbits are unremarkable. There is minimal bilateral ethmoid air cell mucosal thickening. Mastoid air cells are clear. Major intracranial vascular flow voids are preserved.  IMPRESSION: 1. No acute intracranial abnormality. 2. Chronic right basal ganglia, right thalamic, and left cerebellar infarcts. 3. Chronic small vessel ischemic disease and cerebral atrophy.   Electronically Signed By: Logan Bores On: 04/14/2014 16:34     Neurology consult.  Disposition as per neurology. MDM   Final diagnoses:  Left leg weakness        Dot Lanes, MD 04/21/14 1210

## 2014-04-14 NOTE — Telephone Encounter (Signed)
Correction:  Pt will go to ED.

## 2014-04-14 NOTE — Telephone Encounter (Signed)
Patient Name: Lynn Hobbs  DOB: 1960-10-22    Initial Comment Caller States daughter is having stiffness in her legs, when she stand up cant hardly move her legs. legs are feeling weak as well, also feels like there is band (tightness) around her head.    Nurse Assessment  Nurse: Wynetta Emery, RN, Baker Janus Date/Time Eilene Ghazi Time): 04/14/2014 10:19:22 AM  Confirm and document reason for call. If symptomatic, describe symptoms. ---[name] has been c/o legs being more stiff and she is having trouble moving her legs and c/o there is a tightness or band around her head. left leg heavy vision the same. 109 over 74  Has the patient traveled out of the country within the last 30 days? ---No  Does the patient require triage? ---Yes  Related visit to physician within the last 2 weeks? ---No  Does the PT have any chronic conditions? (i.e. diabetes, asthma, etc.) ---Yes  List chronic conditions. ---CVA x 2 Heart attack with stent in heart in left side  Did the patient indicate they were pregnant? ---No     Guidelines    Guideline Title Affirmed Question Affirmed Notes  Neurologic Deficit [1] Weakness (i.e., paralysis, loss of muscle strength) of the face, arm or leg on one side of the body AND [2] sudden onset AND [3] present now    Final Disposition User   Call EMS 911 Now Wynetta Emery, RN, Baker Janus    Comments  States she can hardly move the leg feels heavy and the band around her head hurts since has history of prior strokes instead of appt feels she needs to be seen in ED for eval of possible new stroke

## 2014-04-17 ENCOUNTER — Encounter: Payer: Self-pay | Admitting: Internal Medicine

## 2014-04-17 ENCOUNTER — Ambulatory Visit (INDEPENDENT_AMBULATORY_CARE_PROVIDER_SITE_OTHER): Payer: Medicare Other | Admitting: Internal Medicine

## 2014-04-17 VITALS — BP 138/80 | HR 75 | Temp 98.0°F | Resp 20 | Ht 66.0 in | Wt 225.0 lb

## 2014-04-17 DIAGNOSIS — I679 Cerebrovascular disease, unspecified: Secondary | ICD-10-CM | POA: Diagnosis not present

## 2014-04-17 DIAGNOSIS — E0840 Diabetes mellitus due to underlying condition with diabetic neuropathy, unspecified: Secondary | ICD-10-CM | POA: Diagnosis not present

## 2014-04-17 DIAGNOSIS — I10 Essential (primary) hypertension: Secondary | ICD-10-CM

## 2014-04-17 NOTE — Progress Notes (Signed)
Subjective:    Patient ID: Lynn Hobbs, female    DOB: 06/05/1960, 54 y.o.   MRN: [id number]  HPI  IMPRESSION: 1. No acute intracranial abnormality. 2. Chronic right basal ganglia, right thalamic, and left cerebellar infarcts. 3. Chronic small vessel ischemic disease and cerebral atrophy.   Electronically Signed  By: Logan Bores  On: 04/14/2014 56:49  54 year old patient who has diabetes with complications of peripheral neuropathy.  For the past few weeks she has had the leg cramps, left greater than right.  These cramps are precipitated by activity.  following prolonged activity.  She was seen in the ED 3 days ago, and electrolytes were unremarkable. She states that she has had much more difficulty walking and she appears to be fearful of the exercise inducing severe cramping.  Results of brain MRI noted above  She is scheduled to see endocrinology next month. She is scheduled to see cardiology and ophthalmology later this month  There is been some significant voluntary weight loss over the past 2 weeks. Past Medical History  Diagnosis Date  . BENIGN NEOPLASM OF SKIN SITE UNSPECIFIED 09/08/2008  . CEREBROVASCULAR DISEASE 12/11/2008  . DIABETES MELLITUS, TYPE II 10/12/2006  . HYPERLIPIDEMIA 10/12/2006  . HYPERTENSION 10/12/2006  . PERIPHERAL NEUROPATHY 10/21/2006  . Endometriosis   . Fibroids     History   Social History  . Marital Status: Single    Spouse Name: N/A    Number of Children: N/A  . Years of Education: N/A   Occupational History  . Not on file.   Social History Main Topics  . Smoking status: Never Smoker   . Smokeless tobacco: Never Used  . Alcohol Use: No  . Drug Use: No  . Sexual Activity: Not on file   Other Topics Concern  . Not on file   Social History Narrative   Regular exercise: no   Caffeine use: ocassionally    Past Surgical History  Procedure Laterality Date  . Abdominal hysterectomy    . Left heart catheterization with  coronary angiogram N/A 03/24/2013    Procedure: LEFT HEART CATHETERIZATION WITH CORONARY ANGIOGRAM;  Surgeon: Troy Sine, MD;  Location: [facility] CATH LAB;  Service: Cardiovascular;  Laterality: N/A;    No family history on file.  No Known Allergies  Current Outpatient Prescriptions on File Prior to Visit  Medication Sig Dispense Refill  . acetaminophen (TYLENOL) 650 MG CR tablet Take 1,300 mg by mouth daily as needed for pain.     Marland Kitchen amLODipine (NORVASC) 10 MG tablet TAKE ONE TABLET BY MOUTH ONCE DAILY 90 tablet 1  . aspirin EC 81 MG tablet Take 81 mg by mouth daily.    Marland Kitchen atorvastatin (LIPITOR) 80 MG tablet TAKE ONE TABLET BY MOUTH AT BEDTIME 30 tablet 3  . carvedilol (COREG) 25 MG tablet TAKE ONE & ONE-HALF TABLETS BY MOUTH TWICE DAILY 90 tablet 5  . chlorthalidone (HYGROTON) 25 MG tablet TAKE ONE TABLET BY MOUTH ONCE DAILY 90 tablet 1  . clopidogrel (PLAVIX) 75 MG tablet TAKE ONE TABLET BY MOUTH ONCE DAILY WITH BREAKFAST 30 tablet 5  . glucose blood test strip 1 each by Other route 4 (four) times daily -  before meals and at bedtime. 100 each 12  . insulin aspart (NOVOLOG FLEXPEN) 100 UNIT/ML FlexPen Inject under skin 25 units before brunch, 20 units before dinner 15 mL 3  . Insulin Glargine (LANTUS SOLOSTAR) 100 UNIT/ML Solostar Pen Inject 35 Units into the skin at bedtime. 15  mL 3  . Insulin Pen Needle (PEN NEEDLES 31GX5/16") 31G X 8 MM MISC 1 each by Does not apply route 4 (four) times daily as needed. 100 each 12  . losartan (COZAAR) 100 MG tablet TAKE ONE TABLET BY MOUTH ONCE DAILY 90 tablet 1  . metFORMIN (GLUCOPHAGE) 500 MG tablet Take 1,000 mg by mouth 2 (two) times daily with a meal.    . nitroGLYCERIN (NITROSTAT) 0.4 MG SL tablet Place 1 tablet (0.4 mg total) under the tongue every 5 (five) minutes x 3 doses as needed for chest pain. 30 tablet 12  . potassium chloride SA (K-DUR,KLOR-CON) 20 MEQ tablet Take 1 tablet (20 mEq total) by mouth daily. 90 tablet 3   No current  facility-administered medications on file prior to visit.    BP 138/80 mmHg  Pulse 75  Temp(Src) 98 F (36.7 C) (Oral)  Resp 20  Ht 5\' 6"  (1.676 m)  Wt 225 lb (102.059 kg)  BMI 36.33 kg/m2  SpO2 97%      Review of Systems  Constitutional: Negative.   HENT: Negative for congestion, dental problem, hearing loss, rhinorrhea, sinus pressure, sore throat and tinnitus.   Eyes: Negative for pain, discharge and visual disturbance.  Respiratory: Negative for cough and shortness of breath.   Cardiovascular: Negative for chest pain, palpitations and leg swelling.  Gastrointestinal: Negative for nausea, vomiting, abdominal pain, diarrhea, constipation, blood in stool and abdominal distention.  Genitourinary: Negative for dysuria, urgency, frequency, hematuria, flank pain, vaginal bleeding, vaginal discharge, difficulty urinating, vaginal pain and pelvic pain.  Musculoskeletal: Positive for myalgias and gait problem. Negative for joint swelling and arthralgias.  Skin: Negative for rash.  Neurological: Negative for dizziness, syncope, speech difficulty, weakness, numbness and headaches.  Hematological: Negative for adenopathy.  Psychiatric/Behavioral: Negative for behavioral problems, dysphoric mood and agitation. The patient is not nervous/anxious.        Objective:   Physical Exam  Constitutional: She is oriented to person, place, and time. She appears well-developed and well-nourished.  HENT:  Head: Normocephalic.  Right Ear: External ear normal.  Left Ear: External ear normal.  Mouth/Throat: Oropharynx is clear and moist.  Eyes: Conjunctivae and EOM are normal. Pupils are equal, round, and reactive to light.  Neck: Normal range of motion. Neck supple. No thyromegaly present.  Cardiovascular: Normal rate, regular rhythm, normal heart sounds and intact distal pulses.   Pulmonary/Chest: Effort normal and breath sounds normal.  Abdominal: Soft. Bowel sounds are normal. She exhibits no  mass. There is no tenderness.  Musculoskeletal: Normal range of motion.  Lymphadenopathy:    She has no cervical adenopathy.  Neurological: She is alert and oriented to person, place, and time.  Left arm weakness Bilateral hip flexion appeared normal Walks with a cane  Skin: Skin is warm and dry. No rash noted.  Psychiatric: She has a normal mood and affect. Her behavior is normal.          Assessment & Plan:  Leg cramps, left greater than right.  Patient was asked to liberalize her fluid intake.  Stretching exercises.  Encouraged.  Will set up for physical therapy due to unsteady gait Diabetes mellitus.  Follow-up endocrinology Essential hypertension, stable

## 2014-04-17 NOTE — Patient Instructions (Addendum)
Leg Cramps Leg cramps that occur during exercise can be caused by poor circulation or dehydration. However, muscle cramps that occur at rest or during the night are usually not due to any serious medical problem. Heat cramps may cause muscle spasms during hot weather.  CAUSES There is no clear cause for muscle cramps. However, dehydration may be a factor for those who do not drink enough fluids and those who exercise in the heat. Imbalances in the level of sodium, potassium, calcium or magnesium in the muscle tissue may also be a factor. Some medications, such as water pills (diuretics), may cause loss of chemicals that the body needs (like sodium and potassium) and cause muscle cramps. TREATMENT   Make sure your diet has enough fluids and essential minerals for the muscle to work normally.  Avoid strenuous exercise for several days if you have been having frequent leg cramps.  Stretch and massage the cramped muscle for several minutes.  Some medicines may be helpful in some patients with night cramps. Only take over-the-counter or prescription medicines as directed by your caregiver. SEEK IMMEDIATE MEDICAL CARE IF:   Your leg cramps become worse.  Your foot becomes cold, numb, or blue. Document Released: 04/03/2004 Document Revised: 05/19/2011 Document Reviewed: 03/21/2008 [facility] Patient Information 2015 [place], [place]. This information is not intended to replace advice given to you by your health care provider. Make sure you discuss any questions you have with your health care provider.   Physical therapy as discussed Increase fluid intake as discussed

## 2014-04-17 NOTE — Progress Notes (Signed)
   Subjective:    Patient ID: Lynn Hobbs, female    DOB: March 09, 1961, 54 y.o.   MRN: [id number]  HPI  Wt Readings from Last 3 Encounters:  04/17/14 225 lb (102.059 kg)  03/29/14 233 lb (105.688 kg)  03/01/14 229 lb (103.874 kg)    Review of Systems     Objective:   Physical Exam        Assessment & Plan:

## 2014-04-17 NOTE — Progress Notes (Signed)
Pre visit review using our clinic review tool, if applicable. No additional management support is needed unless otherwise documented below in the visit note. 

## 2014-04-25 ENCOUNTER — Other Ambulatory Visit: Payer: Self-pay | Admitting: *Deleted

## 2014-04-25 MED ORDER — CLOPIDOGREL BISULFATE 75 MG PO TABS: ORAL_TABLET | ORAL | Status: DC

## 2014-05-01 ENCOUNTER — Ambulatory Visit (INDEPENDENT_AMBULATORY_CARE_PROVIDER_SITE_OTHER): Payer: Medicare Other | Admitting: Ophthalmology

## 2014-05-02 ENCOUNTER — Encounter: Payer: Self-pay | Admitting: Internal Medicine

## 2014-05-02 ENCOUNTER — Ambulatory Visit (INDEPENDENT_AMBULATORY_CARE_PROVIDER_SITE_OTHER): Payer: Medicare Other | Admitting: Ophthalmology

## 2014-05-02 DIAGNOSIS — H35033 Hypertensive retinopathy, bilateral: Secondary | ICD-10-CM

## 2014-05-02 DIAGNOSIS — H43813 Vitreous degeneration, bilateral: Secondary | ICD-10-CM | POA: Diagnosis not present

## 2014-05-02 DIAGNOSIS — E11319 Type 2 diabetes mellitus with unspecified diabetic retinopathy without macular edema: Secondary | ICD-10-CM | POA: Diagnosis not present

## 2014-05-02 DIAGNOSIS — I10 Essential (primary) hypertension: Secondary | ICD-10-CM

## 2014-05-02 DIAGNOSIS — E11359 Type 2 diabetes mellitus with proliferative diabetic retinopathy without macular edema: Secondary | ICD-10-CM

## 2014-05-02 LAB — HM DIABETES EYE EXAM

## 2014-05-03 ENCOUNTER — Encounter: Payer: Self-pay | Admitting: Cardiovascular Disease

## 2014-05-03 ENCOUNTER — Ambulatory Visit (INDEPENDENT_AMBULATORY_CARE_PROVIDER_SITE_OTHER): Payer: Medicare Other | Admitting: Cardiovascular Disease

## 2014-05-03 VITALS — BP 144/80 | HR 79 | Ht 65.0 in | Wt 232.9 lb

## 2014-05-03 DIAGNOSIS — E785 Hyperlipidemia, unspecified: Secondary | ICD-10-CM

## 2014-05-03 DIAGNOSIS — I10 Essential (primary) hypertension: Secondary | ICD-10-CM | POA: Diagnosis not present

## 2014-05-03 MED ORDER — NITROGLYCERIN 0.4 MG SL SUBL: 0.4000 mg | SUBLINGUAL_TABLET | SUBLINGUAL | Status: DC | PRN

## 2014-05-03 NOTE — Patient Instructions (Signed)
Your physician recommends that you return for lab work fasting.  Your physician wants you to follow-up in: 6 months or sooner if needed with Dr. Kelly. You will receive a reminder letter in the mail two months in advance. If you don't receive a letter, please call our office to schedule the follow-up appointment. 

## 2014-05-04 DIAGNOSIS — I10 Essential (primary) hypertension: Secondary | ICD-10-CM | POA: Diagnosis not present

## 2014-05-04 DIAGNOSIS — E785 Hyperlipidemia, unspecified: Secondary | ICD-10-CM | POA: Diagnosis not present

## 2014-05-04 LAB — LIPID PANEL
Cholesterol: 203 mg/dL — ABNORMAL HIGH (ref 0–200)
HDL: 54 mg/dL (ref >=46)
LDL Cholesterol: 131 mg/dL — ABNORMAL HIGH (ref 0–99)
Total CHOL/HDL Ratio: 3.8 Ratio
Triglycerides: 89 mg/dL (ref <150)
VLDL: 18 mg/dL (ref 0–40)

## 2014-05-04 LAB — COMPREHENSIVE METABOLIC PANEL
ALBUMIN: 4.2 g/dL (ref 3.5–5.2)
ALK PHOS: 43 U/L (ref 39–117)
ALT: 10 U/L (ref 0–35)
AST: 15 U/L (ref 0–37)
BUN: 19 mg/dL (ref 6–23)
CO2: 26 mEq/L (ref 19–32)
CREATININE: 1.16 mg/dL — AB (ref 0.50–1.10)
Calcium: 9.4 mg/dL (ref 8.4–10.5)
Chloride: 103 mEq/L (ref 96–112)
Glucose, Bld: 117 mg/dL — ABNORMAL HIGH (ref 70–99)
POTASSIUM: 4.7 meq/L (ref 3.5–5.3)
SODIUM: 140 meq/L (ref 135–145)
Total Bilirubin: 0.5 mg/dL (ref 0.2–1.2)
Total Protein: 7.5 g/dL (ref 6.0–8.3)

## 2014-05-05 ENCOUNTER — Encounter: Payer: Self-pay | Admitting: Cardiovascular Disease

## 2014-05-05 NOTE — Progress Notes (Signed)
Patient ID: Lynn Hobbs, female   DOB: Mar 02, 1961, 54 y.o.   MRN: [id number]     HPI: Lynn Hobbs is a 53 y.o. female office for a 6 month cardiology evaluation.  Ms. Coppa has a history of diabetes mellitus with neurologic complications,a history of prior CVA, hyperlipidemia, and peripheral neuropathy. She developed chest pain leading to her hospitalization on 03/23/2013.  Subsequent ECG after arrival to the hospital in the early morning showed ST elevation anteriorly. Troponin was positive for 0.86 and urgent catheterization was performed by me. Catheterization demonstrated a 99% stenosis in the LAD on the proximal supple diagonal vessel with initial TIMI one half low. She a diffusely diseased distal LAD with 50-60% stenosis, and she also had 40-50% narrowings in the first diagonal vessel. The circumflex vessel had mild irregularities. The RCA was diffusely diseased with 40% near ostial narrowing, diffuse 70-80% mid stenoses, 80% stenosis before the acute margin, and 60% distal stenosis.  She underwent insertion of a 3.0 x18 mm Xience Alpine stent postdilated 3.25 mm meters in her mid LAD.  She was discharged on 03/27/2013. She was sent home with a life-vest.   Of note, during the hospital her LDL cholesterol was 184. Hemoglobin A1c was 8.1. A 2-D echo Doppler study pre-discharge following her initial event showed an ejection fraction of 30-35% and for this reason a life vest was initially recommended with plans for a followup echo in 3 months.  A followup echo Doppler study on June 14, 2013 showed improved LV function with ejection fraction increasing to 45-50%.  She did have left ventricular hypertrophy.  There was akinesis of the apical myocardium.  Her LifeVest was discontinued.  Followup blood work on May 25, 2013.  This showed markedly improved lipid status on her atorvastatin 80 mg with her total cholesterol improving from 259 to150 and your LDL cholesterol from 184 to 86.  HDL was 50,  triglycerides 68.  Inside last saw her, she denies recurrent episodes of chest pain or shortness of breath.  She has developed some occasional leg spasm.  She has seen Dr. Kennieth Rad and will be undergoing physical therapy.  Past Medical History  Diagnosis Date  . BENIGN NEOPLASM OF SKIN SITE UNSPECIFIED 09/08/2008  . CEREBROVASCULAR DISEASE 12/11/2008  . DIABETES MELLITUS, TYPE II 10/12/2006  . HYPERLIPIDEMIA 10/12/2006  . HYPERTENSION 10/12/2006  . PERIPHERAL NEUROPATHY 10/21/2006  . Endometriosis   . Fibroids     Past Surgical History  Procedure Laterality Date  . Abdominal hysterectomy    . Left heart catheterization with coronary angiogram N/A 03/24/2013    Procedure: LEFT HEART CATHETERIZATION WITH CORONARY ANGIOGRAM;  Surgeon: Troy Sine, MD;  Location: [facility] CATH LAB;  Service: Cardiovascular;  Laterality: N/A;    No Known Allergies  Current Outpatient Prescriptions  Medication Sig Dispense Refill  . acetaminophen (TYLENOL) 650 MG CR tablet Take 1,300 mg by mouth daily as needed for pain.     Marland Kitchen amLODipine (NORVASC) 10 MG tablet TAKE ONE TABLET BY MOUTH ONCE DAILY 90 tablet 1  . aspirin EC 81 MG tablet Take 81 mg by mouth daily.    Marland Kitchen atorvastatin (LIPITOR) 80 MG tablet TAKE ONE TABLET BY MOUTH AT BEDTIME 30 tablet 3  . carvedilol (COREG) 25 MG tablet TAKE ONE & ONE-HALF TABLETS BY MOUTH TWICE DAILY 90 tablet 5  . chlorthalidone (HYGROTON) 25 MG tablet TAKE ONE TABLET BY MOUTH ONCE DAILY 90 tablet 1  . clopidogrel (PLAVIX) 75 MG tablet TAKE ONE TABLET  BY MOUTH ONCE DAILY WITH BREAKFAST 30 tablet 5  . glucose blood test strip 1 each by Other route 4 (four) times daily -  before meals and at bedtime. 100 each 12  . insulin aspart (NOVOLOG FLEXPEN) 100 UNIT/ML FlexPen Inject under skin 25 units before brunch, 20 units before dinner 15 mL 3  . Insulin Glargine (LANTUS SOLOSTAR) 100 UNIT/ML Solostar Pen Inject 35 Units into the skin at bedtime. 15 mL 3  . Insulin Pen Needle (PEN NEEDLES  31GX5/16") 31G X 8 MM MISC 1 each by Does not apply route 4 (four) times daily as needed. 100 each 12  . losartan (COZAAR) 100 MG tablet TAKE ONE TABLET BY MOUTH ONCE DAILY 90 tablet 1  . metFORMIN (GLUCOPHAGE) 500 MG tablet Take 1,000 mg by mouth 2 (two) times daily with a meal.    . nitroGLYCERIN (NITROSTAT) 0.4 MG SL tablet Place 1 tablet (0.4 mg total) under the tongue every 5 (five) minutes x 3 doses as needed for chest pain. 15 tablet 12  . potassium chloride SA (K-DUR,KLOR-CON) 20 MEQ tablet Take 1 tablet (20 mEq total) by mouth daily. 90 tablet 3   No current facility-administered medications for this visit.    History   Social History  . Marital Status: Single    Spouse Name: N/A  . Number of Children: N/A  . Years of Education: N/A   Occupational History  . Not on file.   Social History Main Topics  . Smoking status: Never Smoker   . Smokeless tobacco: Never Used  . Alcohol Use: No  . Drug Use: No  . Sexual Activity: Not on file   Other Topics Concern  . Not on file   Social History Narrative   Regular exercise: no   Caffeine use: ocassionally    History reviewed. No pertinent family history.  ROS General: Negative; No fevers, chills, or night sweats;  HEENT: Negative; No changes in vision or hearing, sinus congestion, difficulty swallowing Pulmonary: Negative; No cough, wheezing, shortness of breath, hemoptysis Cardiovascular: See history of present illness; No chest pain, presyncope, syncope, palpitations GI: Negative; No nausea, vomiting, diarrhea, or abdominal pain GU: Negative; No dysuria, hematuria, or difficulty voiding Musculoskeletal: Positive for leg spasms. Hematologic/Oncology: Negative; no easy bruising, bleeding Endocrine: Positive for diabetes; no heat/cold intolerance; Neuro: Negative; no changes in balance, headaches Skin: Negative; No rashes or skin lesions Psychiatric: Negative; No behavioral problems, depression Sleep: Negative; No  snoring, daytime sleepiness, hypersomnolence, bruxism, restless legs, hypnogognic hallucinations, no cataplexy Other comprehensive 14 point system review is negative.  PE BP 144/80 mmHg  Pulse 79  Ht '5\' 5"'  (1.651 m)  Wt 232 lb 14.4 oz (105.643 kg)  BMI 38.76 kg/m2  General: Alert, oriented, no distress.  Skin: normal turgor, no rashes HEENT: Normocephalic, atraumatic. Pupils round and reactive; sclera anicteric;no lid lag. Extraocular muscles intact;; no xanthelasmas. Nose without nasal septal hypertrophy Mouth/Parynx benign; Mallinpatti scale 3 Neck: No JVD, no carotid bruits; normal carotid upstroke Lungs: clear to ausculatation and percussion; no wheezing or rales Chest wall: no tenderness to palpitation Heart: RRR, s1 s2 normal; 1/6 systolic murmur; no S3 or S4 gallop no diastolic murmur, rub thrills or heaves Abdomen: soft, nontender; no hepatosplenomehaly, BS+; abdominal aorta nontender and not dilated by palpation. Back: no CVA tenderness Pulses 2+ Extremities: no clubbing cyanosis or edema, Homan's sign negative  Neurologic: grossly nonfocal; cranial nerves grossly normal. Psychologic: normal affect and mood.  ECG (independently read by me): Normal sinus rhythm  at 79 bpm.  Inferior Q waves in lead III and F.  Poor anterior R-wave progression.  August 2015 ECG (independently read by me): Sinus rhythm at 73 beats per minute.  Probably a progression anteriorly, concordant with her prior MI. QTc interval 449 ms  06/30/2013 ECG today (independently read by me): Sinus rhythm at 79 beats per minute.  Poor with progression anteriorly.  Prior ECG (independently read by me): Normal sinus rhythm at 86 beats per minute anterior Q waves with her MI. QTc interval 459 ms   LABS:  BMET  BMP Latest Ref Rng 05/03/2014 04/14/2014 05/25/2013  Glucose 70 - 99 mg/dL 117(H) 215(H) 166(H)  BUN 6 - 23 mg/dL 19 27(H) 22  Creatinine 0.50 - 1.10 mg/dL 1.16(H) 1.20(H) 1.23(H)  Sodium 135 - 145 mEq/L  140 139 138  Potassium 3.5 - 5.3 mEq/L 4.7 4.0 4.4  Chloride 96 - 112 mEq/L 103 100 97  CO2 19 - 32 mEq/L 26 - 32  Calcium 8.4 - 10.5 mg/dL 9.4 - 9.6     Hepatic Function Panel   Hepatic Function Latest Ref Rng 05/03/2014 05/25/2013 03/23/2013  Total Protein 6.0 - 8.3 g/dL 7.5 7.8 8.7(H)  Albumin 3.5 - 5.2 g/dL 4.2 4.5 4.3  AST 0 - 37 U/L '15 16 18  ' ALT 0 - 35 U/L '10 12 13  ' Alk Phosphatase 39 - 117 U/L 43 57 61  Total Bilirubin 0.2 - 1.2 mg/dL 0.5 0.5 <0.2(L)  Bilirubin, Direct 0.0 - 0.3 mg/dL - - -    CBC  CBC Latest Ref Rng 04/14/2014 05/25/2013 03/27/2013  WBC 4.0 - 10.5 K/uL - 7.2 9.6  Hemoglobin 12.0 - 15.0 g/dL 13.3 11.9(L) 10.9(L)  Hematocrit 36.0 - 46.0 % 39.0 35.6(L) 33.5(L)  Platelets 150 - 400 K/uL - 222 207     BNP No results found for: PROBNP  Lipid Panel     Component Value Date/Time   CHOL 203* 05/03/2014 1012   TRIG 89 05/03/2014 1012   HDL 54 05/03/2014 1012   CHOLHDL 3.8 05/03/2014 1012   VLDL 18 05/03/2014 1012   LDLCALC 131* 05/03/2014 1012     RADIOLOGY: No results found.    ASSESSMENT AND PLAN: Ms. Minnie Legros suffered an ST segment elevation myocardial infarction and presented urgently to the catheterization laboratory in the morning of 03/24/2013. She was found to have subtotal 99% LAD stenosis which was successfully stented with a DES stent.  Over the past year, her medications have been titrated to her current levels and her LV function has improved following revascularization, and maximal medical therapy.  She is maintained on dual antiplatelet therapy with aspirin and Plavix.  Her blood pressure today is controlled on losartan 100 mg daily, Hygroton 25 mg, carvedilol 37.5 mg twice a day.  Amlodipine 10 mg.  She is on lipid lowering therapy with atorvastatin 80 mg.  She is diabetic on metformin and insulin.  She will undergo fasting laboratory and adjustments will be made to her medical regimen if needed.  She is obese.  We discussed weight loss  as well as increased exercise with ideal activity 5 days a week for at least 30 minutes..  I will see her in 6 months for reevaluation or sooner if problems arise.   Troy Sine, MD, [facility]  05/05/2014 10:37 PM

## 2014-05-09 ENCOUNTER — Ambulatory Visit: Payer: Medicare Other | Attending: Internal Medicine

## 2014-05-09 DIAGNOSIS — R269 Unspecified abnormalities of gait and mobility: Secondary | ICD-10-CM | POA: Diagnosis not present

## 2014-05-09 DIAGNOSIS — M62838 Other muscle spasm: Secondary | ICD-10-CM | POA: Insufficient documentation

## 2014-05-09 NOTE — Therapy (Signed)
[facility] [address] [place], [place], [address] Phone: [phone]   Fax:  [phone]  Physical Therapy Evaluation  Patient Details  Name: Lynn Hobbs MRN: [id number] Date of Birth: 1960/10/09 Referring Provider:  Marletta Lor, MD  Encounter Date: 05/09/2014      PT End of Session - 05/09/14 1232    Visit Number 1   Number of Visits 1  eval only   Date for PT Re-Evaluation 05/09/14   Authorization Type Medicare   PT Start Time 1147   PT Stop Time 1219   PT Time Calculation (min) 32 min   Activity Tolerance Patient tolerated treatment well   Behavior During Therapy [facility] for tasks assessed/performed      Past Medical History  Diagnosis Date  . BENIGN NEOPLASM OF SKIN SITE UNSPECIFIED 09/08/2008  . CEREBROVASCULAR DISEASE 12/11/2008  . DIABETES MELLITUS, TYPE II 10/12/2006  . HYPERLIPIDEMIA 10/12/2006  . HYPERTENSION 10/12/2006  . PERIPHERAL NEUROPATHY 10/21/2006  . Endometriosis   . Fibroids     Past Surgical History  Procedure Laterality Date  . Abdominal hysterectomy    . Left heart catheterization with coronary angiogram N/A 03/24/2013    Procedure: LEFT HEART CATHETERIZATION WITH CORONARY ANGIOGRAM;  Surgeon: Troy Sine, MD;  Location: [facility] CATH LAB;  Service: Cardiovascular;  Laterality: N/A;    There were no vitals taken for this visit.  Visit Diagnosis:  Abnormality of gait - Plan: PT plan of care cert/re-cert      Subjective Assessment - 05/09/14 1156    Symptoms B LE spasms/cramping, B LE weakness that started the beginning of 04/2014 but has since ceased.   Pertinent History CVA in 2005 and 2007, diabetes with peripheral neuropathy and MI in 2015 s/p stent   Patient Stated Goals None, as pt reported she is back to PLOF and does not feel she needs PT.   Currently in Pain? No/denies          [facility] PT Assessment - 05/09/14 1157    Assessment   Medical Diagnosis CVA   Onset Date  04/14/14   Prior Therapy CIR after CVAs   Precautions   Precautions None   Restrictions   Weight Bearing Restrictions No   Balance Screen   Has the patient fallen in the past 6 months No   Has the patient had a decrease in activity level because of a fear of falling?  No   Is the patient reluctant to leave their home because of a fear of falling?  No   Home Environment   Living Enviornment Private residence   Living Arrangements Parent  Mother-Anne   Available Help at Discharge Family   Type of [place] to enter   Entrance Stairs-Number of Steps 3   Entrance Stairs-Rails Can reach both   [place] One level   [place] - single point;Tub bench;Bedside commode   Prior Function   Level of Independence Requires assistive device for independence;Independent with basic ADLs;Independent with transfers   Vocation On disability   Leisure Watching TV, go to [employer]   Overall Cognitive Status Within Functional Limits for tasks assessed   Observation/Other Assessments   Focus on Therapeutic Outcomes (FOTO)  PHysical fear: FOTO: 47   Sensation   Light Touch Appears Intact   Additional Comments N/T in R hand at night, and L leg "feels heavier than R leg the stroke."   Coordination  Gross Motor Movements are Fluid and Coordinated Yes   Fine Motor Movements are Fluid and Coordinated No  L hand:incr. time for finger to thumb.   Posture/Postural Control   Posture/Postural Control Postural limitations   Postural Limitations Forward head   Tone   Assessment Location Right Lower Extremity;Left Lower Extremity   ROM / Strength   AROM / PROM / Strength AROM;Strength   AROM   Overall AROM  Within functional limits for tasks performed   Strength   Overall Strength Within functional limits for tasks performed   Overall Strength Comments B LE grossly: 5/5, except for L hip flexion and ankle dorsiflex: 4/5.   Transfers   Transfers Sit to  Stand;Stand to Sit   Sit to Stand 6: Modified independent (Device/Increase time);Without upper extremity assist;From chair/3-in-1   Stand to Sit 6: Modified independent (Device/Increase time);Without upper extremity assist;To chair/3-in-1   Ambulation/Gait   Ambulation/Gait Yes   Ambulation/Gait Assistance 6: Modified independent (Device/Increase time)   Ambulation Distance (Feet) 100 Feet   Assistive device Straight cane   Gait Pattern Decreased dorsiflexion - left;Step-through pattern;Decreased stride length   Ambulation Surface Level;Indoor   Gait velocity 2.59ft/sec.  with SPC   Gait Comments Pt ambulated over even terrain with no LOB, demonstrated safe technique.   Balance   Balance Assessed Yes   Static Standing Balance   Static Standing - Balance Support No upper extremity supported   Static Standing - Level of Assistance 5: Stand by assistance;4: Min assist   Static Standing - Comment/# of Minutes Pt performed feet together/apart for 30 seconds without LOB and B single leg stance for 3 seconds before requiring min A to maintain balance.    Dynamic Standing Balance   Dynamic Standing - Balance Support No upper extremity supported   Dynamic Standing - Level of Assistance 5: Stand by assistance   Dynamic Standing - Balance Activities Reaching for objects   Dynamic Standing - Comments Pt able to reach >10" outside BOS without LOB.   Standardized Balance Assessment   Standardized Balance Assessment Timed Up and Go Test   Timed Up and Go Test   TUG Normal TUG   Normal TUG (seconds) 13.55  with SPC   RLE Tone   RLE Tone Within Functional Limits   LLE Tone   LLE Tone Within Functional Limits                          PT Education - 05/09/14 1231    Education provided Yes   Education Details PT encouraged pt to perform prior PT exercises, strengthening 2-3x/week and walking every day.    Person(s) Educated Patient;Parent(s)   Methods Explanation    Comprehension Verbalized understanding          PT Short Term Goals - 05/09/14 1235    PT SHORT TERM GOAL #1   Title None, as pt does not require PT at this time. Eval only.                  Plan - 05/09/14 1153    Clinical Impression Statement Pt is a pleasant 54y/o female presenting to OPPT neuro with difficulty walking due to B LE cramping/spasms and weakness. Pt reported she had a couple of episode when she could not pick up her B LE due to the cramping, but it went away quickly.  Pt reported she feels like she is now back to prior level of function. MD  referred pt to PT the beginning of 04/2014 due to unsteady gait. Pt's gait speed indicates she is a limited community ambulator Pt's TUG time is [facility] indicating she is at the cut off for risk of falls as she performed TUG in 13.55 seconds and TUG <13.5 seconds indicates decr. Falls risk. Pt is also able to perform balance activites without LOB, except for single leg stance >3 seconds. However. pt is now back to PLOF and does not require PT at this time.    PT Frequency One time visit   PT Next Visit Plan None   Consulted and Agree with Plan of Care Patient;Family member/caregiver   Family Member Consulted Anne-pt's mom          G-Codes - 2014-06-07 1236    Functional Assessment Tool Used TUG: 13.55sec; gait speed with SPC: 2.62ft/sec.   Functional Limitation Mobility: Walking and moving around   Mobility: Walking and Moving Around Current Status [phone]) At least 1 percent but less than 20 percent impaired, limited or restricted   Mobility: Walking and Moving Around Goal Status [phone]) At least 1 percent but less than 20 percent impaired, limited or restricted   Mobility: Walking and Moving Around Discharge Status [phone]) At least 1 percent but less than 20 percent impaired, limited or restricted       Problem List Patient Active Problem List   Diagnosis Date Noted  . Obesity (BMI 30-39.9) 11/03/2013  . Secondary  cardiomyopathy, unspecified 03/26/2013  . Chest pain with high risk for cardiac etiology 03/24/2013  . ACS (acute coronary syndrome) 03/24/2013  . ST elevation myocardial infarction (STEMI) of anterior wall 03/24/2013  . Diabetes with neurologic complications 24/40/1027  . Cerebrovascular disease 12/11/2008  . BENIGN NEOPLASM OF SKIN SITE UNSPECIFIED 09/08/2008  . Hereditary and idiopathic peripheral neuropathy 10/21/2006  . HYPERLIPIDEMIA 10/12/2006  . Essential hypertension 10/12/2006    Miller,Jennifer L June 07, 2014, 12:40 PM  [facility] [address] [place], [place], [address] Phone: [phone]   Fax:  [phone]    Geoffry Paradise, PT,DPT 06-07-14 12:40 PM Phone: [phone] Fax: [phone]

## 2014-05-19 ENCOUNTER — Telehealth: Payer: Self-pay | Admitting: *Deleted

## 2014-05-19 NOTE — Telephone Encounter (Signed)
Called patient to give lab results and recommendations. No answer or message machine. I will try later.

## 2014-05-19 NOTE — Telephone Encounter (Signed)
-----   Message from Troy Sine, MD sent at 05/08/2014 11:48 AM EST ----- Labs better; add zetia 10 mg to atorvastatin 80 mg.

## 2014-05-31 ENCOUNTER — Encounter: Payer: Self-pay | Admitting: Internal Medicine

## 2014-05-31 ENCOUNTER — Other Ambulatory Visit: Payer: Self-pay | Admitting: *Deleted

## 2014-05-31 ENCOUNTER — Ambulatory Visit (INDEPENDENT_AMBULATORY_CARE_PROVIDER_SITE_OTHER): Payer: Medicare Other | Admitting: Internal Medicine

## 2014-05-31 DIAGNOSIS — E1140 Type 2 diabetes mellitus with diabetic neuropathy, unspecified: Secondary | ICD-10-CM

## 2014-05-31 DIAGNOSIS — E1142 Type 2 diabetes mellitus with diabetic polyneuropathy: Secondary | ICD-10-CM | POA: Diagnosis not present

## 2014-05-31 MED ORDER — INSULIN ASPART 100 UNIT/ML FLEXPEN: PEN_INJECTOR | SUBCUTANEOUS | Status: DC

## 2014-05-31 NOTE — Patient Instructions (Signed)
Please continue Metformin 1000 mg 2x a day Continue Lantus to 35 units at night.  Decrease Novolog to 25 in am and 15 before dinner Continue adding 7 units of NovoLog before popcorn.   Please come back in 3 mo.  Please stop at the lab.

## 2014-05-31 NOTE — Progress Notes (Signed)
Patient ID: Lynn Hobbs, female   DOB: 1960/11/10, 54 y.o.   MRN: [id number]  HPI: Lynn Hobbs is a 54 y.o.-year-old female, returning for f/u of DM2, dx 2004, insulin-dependent, uncontrolled, with complications (?peripheral neuropathy, STEMI 03/2013, cerebrovascular ds, DR). She is here with her mother, who is also diabetic, and offers part of the history. Last visit 3 mo ago.   Last hemoglobin A1c was: Fructosamine (03/01/2014) >> HbA1c 9.36%. Lab Results  Component Value Date   HGBA1C 11.0* 03/01/2014   HGBA1C 10.4* 11/30/2013   HGBA1C 9.5* 08/30/2013   She is on: - Metformin 1000 mg 2x a day - Lantus 30 >> 35 units at night - Novolog 25 in am and 20 before dinner - 7 units of NovoLog with snacks >15 g of carbs- but has not been snacking We stopped Invokana 100 mg daily >> urinating a lot at night >> impaired sleep.  Pt checks her sugars 5x a day and they are (per log) better (quit eating popcorn, now eats unsalted peanuts for snacks): - am:  57-128 (most 90-100) >> 49-124 >> 114-185 >>106-142, 153, 180 >> 79, 87-124 - 2h after b'fast: 101, 95 >> 102-150 >> 49x1, 66-153 >> 111, 135-172, 206 >> 121-144 - before lunch:104-178 (sometimes eats popcorn before this meal) >> 128-174 >> no lunch, has brunch - 2h after lunch: 89-168 >> 145-192 >> 87-181 >> >> no lunch, has brunch - before dinner: 110, 128 >> 125, 130 >> 83-148, 175x1 >> 127-167 >> 137-188, 200 >> 126-178, 199  - 2h after dinner: 96, 108 >> 140 >> n/c >> 84 >> 159-196 >> 203 >> 126-164 - bedtime: 102, 113, last night 47 >> 49, 60, 62, 77, 101 >> n/c >> 75-139 >> 87-125 >> 87-137 >> 71-100 - nighttime: 54 x 1 >> n/c Lowest sugar was 74 >> 71; she has hypoglycemia awareness at 60. Highest sugar was 201 in last month.  Meals: - b'fast ~ 10 am - dinner ~2:30-3 pm Afterthis, she eats very light, usually fruit.  - mild CKD, last BUN/creatinine:  Lab Results  Component Value Date   BUN 19 05/03/2014   CREATININE 1.16*  05/03/2014  Last ACR high (12/02/2012): 53.8, previously 64.6. She is on Losartan (changed from Olmesartan). - last set of lipids: Lab Results  Component Value Date   CHOL 203* 05/03/2014   HDL 54 05/03/2014   LDLCALC 131* 05/03/2014   LDLDIRECT 214.4 12/02/2012   TRIG 89 05/03/2014   CHOLHDL 3.8 05/03/2014  she is on  Atorvastatin 80. - last eye exam was in 05/02/2014 - Dr. Tempie Hoist. 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 She had Laser Sx in the last. Goes back in 10/2014.  - no numbness and tingling in her feet. She is on aspirin 81 mg.  She has a history of HTN, HL. She had a STEMI  (03/2013) >> midLAD stent placed. She was started on Plavix and beta blocker. Cardiologist: Dr Claiborne Billings.  ROS: Constitutional: no weight loss/gain, no fatigue, no subjective hyperthermia/hypothermia Eyes: no blurry vision, no xerophthalmia ENT: no sore throat, no nodules palpated in throat, no dysphagia/odynophagia, no hoarseness Cardiovascular: no CP/SOB/palpitations/leg swelling Respiratory: no cough/SOB Gastrointestinal: no N/V/D/C Musculoskeletal: no muscle/joint aches Skin: no rashes  I reviewed pt's medications, allergies, PMH, social hx, family hx, and changes were documented in the history of present illness. Otherwise, unchanged from my initial visit note.  PE: BP 118/68 mmHg  Pulse 78  Temp(Src) 97.5 F (36.4 C) (Oral)  Resp 12  Wt  228 lb (103.42 kg)  SpO2 98% Body mass index is 37.94 kg/(m^2).  Wt Readings from Last 3 Encounters:  05/31/14 228 lb (103.42 kg)  05/03/14 232 lb 14.4 oz (105.643 kg)  04/17/14 225 lb (102.059 kg)   Constitutional: overweight, in NAD, uses cane Eyes: PERRLA, EOMI, no exophthalmos ENT: moist mucous membranes, no thyromegaly, no cervical lymphadenopathy Cardiovascular: RRR, No MRG Respiratory: CTA B Gastrointestinal: abdomen soft, NT, ND, BS+ Musculoskeletal: left arm and leg paresis Skin: moist, warm, no rashes Neurological: no tremor with outstretched hands, DTR normal  in all 4  ASSESSMENT: 1. DM2, insulin-dependent, uncontrolled, with complications - ? PN - on Neurontin - cerebrovascular ds. 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 Patient with long-standing, uncontrolled diabetes, now with much better control after increasing Lantus and stopping popcorn snacks. We need to reduce the NovvoLog in pm as she gets lows at bedtime. - I suggested to :  Patient Instructions  Please continue Metformin 1000 mg 2x a day Continue Lantus to 35 units at night.  Decrease Novolog to 25 in am and 15 before dinner Continue adding 7 units of NovoLog before popcorn.   Please come back in 3 mo.  Please stop at the lab.  - continue checking her sugars at different times of the day - check  3-4x a day - given new sugar logs - she is up to date with eye exams - will check fructosamine today - Return to clinic in 3 months with sugar log  Office Visit on 05/31/2014  Component Date Value Ref Range Status  . Fructosamine 05/31/2014 347* 0 - 285 umol/L Final   Comment: Published reference interval for apparently healthy subjects between age 59 and 110 is 63 - 285 umol/L and in a poorly controlled diabetic population is 228 - 563 umol/L with a mean of 396 umol/L.    The HbA1c calculated from fructosamine has dramatically decreased to 7.5%!

## 2014-06-01 LAB — FRUCTOSAMINE: Fructosamine: 347 umol/L — ABNORMAL HIGH (ref 0–285)

## 2014-06-07 ENCOUNTER — Telehealth: Payer: Self-pay | Admitting: *Deleted

## 2014-06-07 DIAGNOSIS — E785 Hyperlipidemia, unspecified: Secondary | ICD-10-CM

## 2014-06-07 DIAGNOSIS — Z79899 Other long term (current) drug therapy: Secondary | ICD-10-CM

## 2014-06-07 MED ORDER — EZETIMIBE 10 MG PO TABS: 10.0000 mg | ORAL_TABLET | Freq: Every day | ORAL | Status: DC

## 2014-06-07 NOTE — Telephone Encounter (Signed)
-----   Message from Troy Sine, MD sent at 05/08/2014 11:48 AM EST ----- Labs better; add zetia 10 mg to atorvastatin 80 mg.

## 2014-06-07 NOTE — Telephone Encounter (Signed)
Notified patient of lab results and recommendations. zetia prescription sent to pharmacy. Lab slip to repeat lipid in 2 months mailed to patient.

## 2014-06-26 ENCOUNTER — Other Ambulatory Visit: Payer: Self-pay | Admitting: Internal Medicine

## 2014-07-04 ENCOUNTER — Ambulatory Visit: Payer: Medicare Other | Admitting: Internal Medicine

## 2014-07-13 ENCOUNTER — Ambulatory Visit (INDEPENDENT_AMBULATORY_CARE_PROVIDER_SITE_OTHER): Payer: Medicare Other | Admitting: Internal Medicine

## 2014-07-13 ENCOUNTER — Encounter: Payer: Self-pay | Admitting: Internal Medicine

## 2014-07-13 VITALS — BP 138/78 | HR 66 | Temp 98.0°F | Resp 20 | Ht 65.0 in | Wt 232.0 lb

## 2014-07-13 DIAGNOSIS — E0840 Diabetes mellitus due to underlying condition with diabetic neuropathy, unspecified: Secondary | ICD-10-CM | POA: Diagnosis not present

## 2014-07-13 DIAGNOSIS — I10 Essential (primary) hypertension: Secondary | ICD-10-CM

## 2014-07-13 DIAGNOSIS — I679 Cerebrovascular disease, unspecified: Secondary | ICD-10-CM | POA: Diagnosis not present

## 2014-07-13 DIAGNOSIS — G609 Hereditary and idiopathic neuropathy, unspecified: Secondary | ICD-10-CM

## 2014-07-13 MED ORDER — LOSARTAN POTASSIUM 100 MG PO TABS: 100.0000 mg | ORAL_TABLET | Freq: Every day | ORAL | Status: DC

## 2014-07-13 NOTE — Progress Notes (Signed)
Subjective:    Patient ID: Lynn Hobbs, female    DOB: 09/10/1960, 54 y.o.   MRN: [id number]  HPI 54 year old patient who is seen today for follow-up.  She is followed by endocrinology and there has been some recent improvement in her glycemic control She has cerebrovascular and coronary artery disease which has been stable. She has essential hypertension She has exogenous obesity Doing quite well today.  Denies any focal neurological symptoms or any active cardiopulmonary complaints.  She has had a recent eye exam  Past Medical History  Diagnosis Date  . BENIGN NEOPLASM OF SKIN SITE UNSPECIFIED 09/08/2008  . CEREBROVASCULAR DISEASE 12/11/2008  . DIABETES MELLITUS, TYPE II 10/12/2006  . HYPERLIPIDEMIA 10/12/2006  . HYPERTENSION 10/12/2006  . PERIPHERAL NEUROPATHY 10/21/2006  . Endometriosis   . Fibroids     History   Social History  . Marital Status: Single    Spouse Name: N/A  . Number of Children: N/A  . Years of Education: N/A   Occupational History  . Not on file.   Social History Main Topics  . Smoking status: Never Smoker   . Smokeless tobacco: Never Used  . Alcohol Use: No  . Drug Use: No  . Sexual Activity: Not on file   Other Topics Concern  . Not on file   Social History Narrative   Regular exercise: no   Caffeine use: ocassionally    Past Surgical History  Procedure Laterality Date  . Abdominal hysterectomy    . Left heart catheterization with coronary angiogram N/A 03/24/2013    Procedure: LEFT HEART CATHETERIZATION WITH CORONARY ANGIOGRAM;  Surgeon: Troy Sine, MD;  Location: [facility] CATH LAB;  Service: Cardiovascular;  Laterality: N/A;    No family history on file.  No Known Allergies  Current Outpatient Prescriptions on File Prior to Visit  Medication Sig Dispense Refill  . acetaminophen (TYLENOL) 650 MG CR tablet Take 1,300 mg by mouth daily as needed for pain.     Marland Kitchen amLODipine (NORVASC) 10 MG tablet TAKE ONE TABLET BY MOUTH ONCE DAILY 90  tablet 1  . aspirin EC 81 MG tablet Take 81 mg by mouth daily.    Marland Kitchen atorvastatin (LIPITOR) 80 MG tablet TAKE ONE TABLET BY MOUTH AT BEDTIME 30 tablet 3  . carvedilol (COREG) 25 MG tablet TAKE ONE & ONE-HALF TABLETS BY MOUTH TWICE DAILY 90 tablet 5  . chlorthalidone (HYGROTON) 25 MG tablet TAKE ONE TABLET BY MOUTH ONCE DAILY 90 tablet 1  . clopidogrel (PLAVIX) 75 MG tablet TAKE ONE TABLET BY MOUTH ONCE DAILY WITH BREAKFAST 30 tablet 5  . ezetimibe (ZETIA) 10 MG tablet Take 1 tablet (10 mg total) by mouth daily. 30 tablet 6  . glucose blood test strip 1 each by Other route 4 (four) times daily -  before meals and at bedtime. 100 each 12  . insulin aspart (NOVOLOG FLEXPEN) 100 UNIT/ML FlexPen Inject under skin 25 units before brunch, 15 units before dinner 15 mL 3  . Insulin Glargine (LANTUS SOLOSTAR) 100 UNIT/ML Solostar Pen Inject 35 Units into the skin at bedtime. 15 mL 3  . Insulin Pen Needle (PEN NEEDLES 31GX5/16") 31G X 8 MM MISC 1 each by Does not apply route 4 (four) times daily as needed. 100 each 12  . metFORMIN (GLUCOPHAGE) 500 MG tablet Take 1,000 mg by mouth 2 (two) times daily with a meal.    . nitroGLYCERIN (NITROSTAT) 0.4 MG SL tablet Place 1 tablet (0.4 mg total) under the  tongue every 5 (five) minutes x 3 doses as needed for chest pain. 15 tablet 12  . potassium chloride SA (K-DUR,KLOR-CON) 20 MEQ tablet Take 1 tablet (20 mEq total) by mouth daily. 90 tablet 3   No current facility-administered medications on file prior to visit.    BP 138/78 mmHg  Pulse 66  Temp(Src) 98 F (36.7 C) (Oral)  Resp 20  Ht 5\' 5"  (1.651 m)  Wt 232 lb (105.235 kg)  BMI 38.61 kg/m2  SpO2 97%      Review of Systems  Constitutional: Negative.   HENT: Negative for congestion, dental problem, hearing loss, rhinorrhea, sinus pressure, sore throat and tinnitus.   Eyes: Negative for pain, discharge and visual disturbance.  Respiratory: Negative for cough and shortness of breath.     Cardiovascular: Negative for chest pain, palpitations and leg swelling.  Gastrointestinal: Negative for nausea, vomiting, abdominal pain, diarrhea, constipation, blood in stool and abdominal distention.  Genitourinary: Negative for dysuria, urgency, frequency, hematuria, flank pain, vaginal bleeding, vaginal discharge, difficulty urinating, vaginal pain and pelvic pain.  Musculoskeletal: Positive for gait problem. Negative for joint swelling and arthralgias.  Skin: Negative for rash.  Neurological: Negative for dizziness, syncope, speech difficulty, weakness, numbness and headaches.  Hematological: Negative for adenopathy.  Psychiatric/Behavioral: Negative for behavioral problems, dysphoric mood and agitation. The patient is not nervous/anxious.        Objective:   Physical Exam  Constitutional: She is oriented to person, place, and time. She appears well-developed and well-nourished.  Repeat blood pressure 138/78 Weight 232  BP Readings from Last 3 Encounters: 07/13/14 : 138/78 05/31/14 : 118/68 05/03/14 : 144/80  HENT:  Head: Normocephalic.  Right Ear: External ear normal.  Left Ear: External ear normal.  Mouth/Throat: Oropharynx is clear and moist.  Eyes: Conjunctivae and EOM are normal. Pupils are equal, round, and reactive to light.  Neck: Normal range of motion. Neck supple. No thyromegaly present.  Cardiovascular: Normal rate, regular rhythm, normal heart sounds and intact distal pulses.   Pulmonary/Chest: Effort normal and breath sounds normal.  Abdominal: Soft. Bowel sounds are normal. She exhibits no mass. There is no tenderness.  Musculoskeletal: Normal range of motion.  Lymphadenopathy:    She has no cervical adenopathy.  Neurological: She is alert and oriented to person, place, and time.  Left hemiparesis  Skin: Skin is warm and dry. No rash noted.  Psychiatric: She has a normal mood and affect. Her behavior is normal.          Assessment & Plan:    Cerebrovascular disease.  Will continue home physical therapy Peripheral neuropathy Essential hypertension Diabetes mellitus.  Follow-up endocrinology Coronary artery disease, stable  Recheck here 6 months

## 2014-07-13 NOTE — Progress Notes (Signed)
Pre visit review using our clinic review tool, if applicable. No additional management support is needed unless otherwise documented below in the visit note. 

## 2014-07-13 NOTE — Patient Instructions (Signed)
Continue active home physical therapy  Limit your sodium (Salt) intake  Please check your blood pressure on a regular basis.  If it is consistently greater than 150/90, please make an office appointment.  Return in 6 months for follow-up

## 2014-07-31 ENCOUNTER — Telehealth: Payer: Self-pay

## 2014-07-31 NOTE — Telephone Encounter (Signed)
Called and spoke with pt and pt states she has never had a mammogram and she is not interested in getting one.  Postponed in chart.

## 2014-08-09 ENCOUNTER — Other Ambulatory Visit: Payer: Self-pay | Admitting: Internal Medicine

## 2014-08-22 DIAGNOSIS — E785 Hyperlipidemia, unspecified: Secondary | ICD-10-CM | POA: Diagnosis not present

## 2014-08-22 DIAGNOSIS — Z79899 Other long term (current) drug therapy: Secondary | ICD-10-CM | POA: Diagnosis not present

## 2014-08-23 LAB — COMPREHENSIVE METABOLIC PANEL
ALBUMIN: 4.1 g/dL (ref 3.5–5.2)
ALT: 12 U/L (ref 0–35)
AST: 13 U/L (ref 0–37)
Alkaline Phosphatase: 50 U/L (ref 39–117)
BILIRUBIN TOTAL: 0.3 mg/dL (ref 0.2–1.2)
BUN: 27 mg/dL — ABNORMAL HIGH (ref 6–23)
CALCIUM: 9.6 mg/dL (ref 8.4–10.5)
CHLORIDE: 100 meq/L (ref 96–112)
CO2: 29 meq/L (ref 19–32)
Creat: 1.36 mg/dL — ABNORMAL HIGH (ref 0.50–1.10)
GLUCOSE: 133 mg/dL — AB (ref 70–99)
POTASSIUM: 3.9 meq/L (ref 3.5–5.3)
Sodium: 138 mEq/L (ref 135–145)
Total Protein: 7.5 g/dL (ref 6.0–8.3)

## 2014-08-23 LAB — LIPID PANEL
Cholesterol: 147 mg/dL (ref 0–200)
HDL: 52 mg/dL (ref >=46)
LDL Cholesterol: 79 mg/dL (ref 0–99)
Total CHOL/HDL Ratio: 2.8 Ratio
Triglycerides: 78 mg/dL (ref <150)
VLDL: 16 mg/dL (ref 0–40)

## 2014-08-25 ENCOUNTER — Telehealth: Payer: Self-pay | Admitting: *Deleted

## 2014-08-25 MED ORDER — CHLORTHALIDONE 25 MG PO TABS: 12.5000 mg | ORAL_TABLET | Freq: Every day | ORAL | Status: DC

## 2014-08-25 NOTE — Telephone Encounter (Signed)
Informed patient of labs and recommendations. Patient voiced verbal understanding of instructions. No questions were asked by her to me.

## 2014-08-25 NOTE — Telephone Encounter (Signed)
-----   Message from Troy Sine, MD sent at 08/23/2014  5:53 PM EDT ----- Decrease chlothalidone to 1/2 with slightly inc BUN/CR; lipids good

## 2014-08-26 ENCOUNTER — Other Ambulatory Visit: Payer: Self-pay | Admitting: Cardiovascular Disease

## 2014-08-28 NOTE — Telephone Encounter (Signed)
Rx(s) sent to pharmacy electronically.  

## 2014-09-15 ENCOUNTER — Ambulatory Visit (INDEPENDENT_AMBULATORY_CARE_PROVIDER_SITE_OTHER): Payer: Medicare Other | Admitting: Internal Medicine

## 2014-09-15 VITALS — BP 124/78 | HR 77 | Temp 98.4°F | Resp 12 | Wt 233.0 lb

## 2014-09-15 DIAGNOSIS — E0840 Diabetes mellitus due to underlying condition with diabetic neuropathy, unspecified: Secondary | ICD-10-CM | POA: Diagnosis not present

## 2014-09-15 NOTE — Patient Instructions (Signed)
Please continue Metformin 1000 mg 2x a day Continue Lantus to 35 units at night.  Decrease Novolog to 25 in am and 15 before dinner Continue adding 7 units of NovoLog before popcorn.   Please come back in 3 mo.  Please stop at the lab.

## 2014-09-15 NOTE — Progress Notes (Signed)
Patient ID: Lynn Hobbs, female   DOB: [birth year], 54 y.o.   MRN: [id number]  HPI: Lynn Hobbs is a 54 y.o.-year-old female, returning for f/u of DM2, dx 2004, insulin-dependent, uncontrolled, with complications (?peripheral neuropathy, STEMI 03/2013, cerebrovascular ds, DR). She is here with her mother, who is also diabetic, and offers part of the history. Last visit 3 mo ago.   Last hemoglobin A1c was: Fructosamine (05/31/2014) >> HbA1c 7.5% Fructosamine (03/01/2014) >> HbA1c 9.36%. Lab Results  Component Value Date   HGBA1C 11.0* 03/01/2014   HGBA1C 10.4* 11/30/2013   HGBA1C 9.5* 08/30/2013   She is on: - Metformin 1000 mg 2x a day - Lantus 30 >> 35 units at night - Novolog 25 in am and 20 before dinner (did not decrease to 15 units as advised at last visit) - 7 units of NovoLog with snacks >15 g of carbs- but has not been snacking We stopped Invokana 100 mg daily >> urinating a lot at night >> impaired sleep.  Pt checks her sugars 5x a day and they are (per log): - am:  57-128 (most 90-100) >> 49-124 >> 114-185 >>106-142, 153, 180 >> 79, 87-124 >> 96-127 - 2h after b'fast: 101, 95 >> 102-150 >> 49x1, 66-153 >> 111, 135-172, 206 >> 121-144 >> 108-137 - before lunch:104-178 (sometimes eats popcorn before this meal) >> 128-174 >> no lunch, has brunch - 2h after lunch: 89-168 >> 145-192 >> 87-181 >> >> no lunch, has brunch - before dinner: 83-148, 175x1 >> 127-167 >> 137-188, 200 >> 126-178, 199 >> 125-150, 184 (may have a snack before dinner) - 2h after dinner: 96, 108 >> 140 >> n/c >> 84 >> 159-196 >> 203 >> 126-164 >> 125-170 - bedtime: 49, 60, 62, 77, 101 >> n/c >> 75-139 >> 87-125 >> 87-137 >> 71-100 >> 73-100 - nighttime: 54 x 1 >> n/c Lowest sugar was 74 >> 71; she has hypoglycemia awareness at 60. Highest sugar was 201 in last month.  Meals: - b'fast ~ 10 am - dinner ~2:30-3 pm Afterthis, she eats very light, usually fruit.  - mild CKD, last BUN/creatinine:  Lab  Results  Component Value Date   BUN 27* 08/22/2014   CREATININE 1.36* 08/22/2014  Last ACR high (12/02/2012): 53.8, previously 64.6. She is on Losartan. - last set of lipids: Lab Results  Component Value Date   CHOL 147 08/22/2014   HDL 52 08/22/2014   LDLCALC 79 08/22/2014   LDLDIRECT 214.4 12/02/2012   TRIG 78 08/22/2014   CHOLHDL 2.8 08/22/2014  She is on  Atorvastatin 80. - last eye exam was in 05/02/2014 - Dr. Tempie Hoist. 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 She had Laser Sx in the last. Goes back in 11/01/2014.  - no numbness and tingling in her feet. She is on aspirin 81 mg.  She has a history of HTN, HL. She had a STEMI  (03/2013) >> midLAD stent placed. She was started on Plavix and beta blocker. Cardiologist: Dr Claiborne Billings.  ROS: Constitutional: no weight loss/gain, no fatigue, no subjective hyperthermia/hypothermia Eyes: no blurry vision, no xerophthalmia ENT: no sore throat, no nodules palpated in throat, no dysphagia/odynophagia, no hoarseness Cardiovascular: no CP/SOB/palpitations/leg swelling Respiratory: no cough/SOB Gastrointestinal: no N/V/D/C Musculoskeletal: no muscle/joint aches Skin: no rashes  I reviewed pt's medications, allergies, PMH, social hx, family hx, and changes were documented in the history of present illness. Otherwise, unchanged from my initial visit note. Her Chlorthalidone dose was decreased to 12.5 mg daily.   PE: BP  124/78 mmHg  Pulse 77  Temp(Src) 98.4 F (36.9 C) (Oral)  Resp 12  Wt 233 lb (105.688 kg)  SpO2 97% Body mass index is 38.77 kg/(m^2).  Wt Readings from Last 3 Encounters:  09/15/14 233 lb (105.688 kg)  07/13/14 232 lb (105.235 kg)  05/31/14 228 lb (103.42 kg)   Constitutional: overweight, in NAD, uses cane Eyes: PERRLA, EOMI, no exophthalmos ENT: moist mucous membranes, no thyromegaly, no cervical lymphadenopathy Cardiovascular: RRR, No MRG Respiratory: CTA B Gastrointestinal: abdomen soft, NT, ND, BS+ Musculoskeletal: left arm and leg  paresis Skin: moist, warm, no rashes Neurological: no tremor with outstretched hands, DTR normal in all 4  ASSESSMENT: 1. DM2, insulin-dependent, uncontrolled, with complications - ? PN - on Neurontin - cerebrovascular ds. 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 Patient with long-standing, uncontrolled diabetes, now with much better control after increasing Lantus and stopping popcorn snacks. She did not reduce the NovoLog with dinner as advised at last visit (as her sugars are low at bedtime) >> advised to do this now. If sugars after dinner increase with this change >> may need to add a small snack at bedtime and go back on the dinnertime Novolog. - I suggested to :  Patient Instructions  Please continue Metformin 1000 mg 2x a day Continue Lantus to 35 units at night.  Decrease Novolog to 25 in am and 15 before dinner Continue adding 7 units of NovoLog before popcorn.   Please come back in 3 mo.  Please stop at the lab.  - continue checking her sugars at different times of the day - check  3 x a day - given new sugar logs - she is up to date with eye exams - will check fructosamine today - Return to clinic in 3 months with sugar log  Hemoglobin A1c calculated back from fructosamine is a little higher, at 8.05%

## 2014-09-18 ENCOUNTER — Encounter: Payer: Self-pay | Admitting: Internal Medicine

## 2014-09-18 LAB — FRUCTOSAMINE: FRUCTOSAMINE: 379 umol/L — AB (ref 190–270)

## 2014-09-20 ENCOUNTER — Other Ambulatory Visit: Payer: Self-pay | Admitting: *Deleted

## 2014-09-20 MED ORDER — GLUCOSE BLOOD VI STRP: ORAL_STRIP | Status: DC

## 2014-09-26 ENCOUNTER — Other Ambulatory Visit: Payer: Self-pay | Admitting: Internal Medicine

## 2014-10-09 ENCOUNTER — Other Ambulatory Visit: Payer: Self-pay | Admitting: Cardiovascular Disease

## 2014-10-09 NOTE — Telephone Encounter (Signed)
REFILL 

## 2014-11-01 ENCOUNTER — Ambulatory Visit (INDEPENDENT_AMBULATORY_CARE_PROVIDER_SITE_OTHER): Payer: Medicare Other | Admitting: Ophthalmology

## 2014-11-01 DIAGNOSIS — E11359 Type 2 diabetes mellitus with proliferative diabetic retinopathy without macular edema: Secondary | ICD-10-CM | POA: Diagnosis not present

## 2014-11-01 DIAGNOSIS — E11311 Type 2 diabetes mellitus with unspecified diabetic retinopathy with macular edema: Secondary | ICD-10-CM | POA: Diagnosis not present

## 2014-11-01 DIAGNOSIS — E11351 Type 2 diabetes mellitus with proliferative diabetic retinopathy with macular edema: Secondary | ICD-10-CM | POA: Diagnosis not present

## 2014-11-01 DIAGNOSIS — H43813 Vitreous degeneration, bilateral: Secondary | ICD-10-CM

## 2014-11-01 DIAGNOSIS — H35033 Hypertensive retinopathy, bilateral: Secondary | ICD-10-CM | POA: Diagnosis not present

## 2014-11-01 DIAGNOSIS — H2513 Age-related nuclear cataract, bilateral: Secondary | ICD-10-CM | POA: Diagnosis not present

## 2014-11-01 DIAGNOSIS — I10 Essential (primary) hypertension: Secondary | ICD-10-CM | POA: Diagnosis not present

## 2014-11-02 ENCOUNTER — Other Ambulatory Visit: Payer: Self-pay | Admitting: Cardiovascular Disease

## 2014-11-02 ENCOUNTER — Other Ambulatory Visit: Payer: Self-pay | Admitting: Internal Medicine

## 2014-11-02 NOTE — Telephone Encounter (Signed)
REFILL 

## 2014-11-08 ENCOUNTER — Telehealth: Payer: Self-pay | Admitting: Internal Medicine

## 2014-11-08 MED ORDER — INSULIN ASPART 100 UNIT/ML FLEXPEN: PEN_INJECTOR | SUBCUTANEOUS | Status: DC

## 2014-11-08 NOTE — Telephone Encounter (Signed)
Pt needs refill on novolog flex pen this has not been called in by [place] before. Call into [facility]

## 2014-11-16 ENCOUNTER — Ambulatory Visit (INDEPENDENT_AMBULATORY_CARE_PROVIDER_SITE_OTHER): Payer: Medicare Other | Admitting: Cardiovascular Disease

## 2014-11-16 ENCOUNTER — Encounter: Payer: Self-pay | Admitting: Cardiovascular Disease

## 2014-11-16 VITALS — BP 132/78 | HR 73 | Ht 67.5 in | Wt 233.2 lb

## 2014-11-16 DIAGNOSIS — E669 Obesity, unspecified: Secondary | ICD-10-CM

## 2014-11-16 DIAGNOSIS — E0849 Diabetes mellitus due to underlying condition with other diabetic neurological complication: Secondary | ICD-10-CM | POA: Diagnosis not present

## 2014-11-16 DIAGNOSIS — I25810 Atherosclerosis of coronary artery bypass graft(s) without angina pectoris: Secondary | ICD-10-CM | POA: Diagnosis not present

## 2014-11-16 DIAGNOSIS — I252 Old myocardial infarction: Secondary | ICD-10-CM | POA: Insufficient documentation

## 2014-11-16 DIAGNOSIS — E785 Hyperlipidemia, unspecified: Secondary | ICD-10-CM | POA: Diagnosis not present

## 2014-11-16 DIAGNOSIS — I10 Essential (primary) hypertension: Secondary | ICD-10-CM

## 2014-11-16 NOTE — Progress Notes (Signed)
Patient ID: Lynn Hobbs, female   DOB: 05/27/1960, 54 y.o.   MRN: [id number]     HPI: Lynn Hobbs is a 54 y.o. female who presents to the office office for a 6 month cardiology evaluation.  [name] has a history of diabetes mellitus with neurologic complications,a history of prior CVA, hyperlipidemia, and peripheral neuropathy. She developed chest pain leading to her hospitalization on 03/23/2013. ECG after arrival to the hospital in the early morning showed ST elevation anteriorly. Troponin was positive for 0.86 and urgent catheterization was performed by me. Catheterization demonstrated a 99% stenosis in the LAD on the proximal to the diagonal vessel with initial TIMI one half low. She a diffusely diseased distal LAD with 50-60% stenosis, and 40-50% narrowings in the first diagonal vessel. The circumflex vessel had mild irregularities. The RCA was diffusely diseased with 40% near ostial narrowing, diffuse 70-80% mid stenoses, 80% stenosis before the acute margin, and 60% distal stenosis.  She underwent insertion of a 3.0 x18 mm Xience Alpine stent postdilated 3.25 mm  in her mid LAD.  She was discharged on 03/27/2013. She was sent home with a life-vest.   Of note, during the hospital her LDL cholesterol was 184. Hemoglobin A1c was 8.1. A 2-D echo Doppler study pre-discharge following her initial event showed an ejection fraction of 30-35% and for this reason a life vest was initially recommended with plans for a followup echo in 3 months.  A followup echo Doppler study on June 14, 2013 showed improved LV function with ejection fraction increasing to 45-50%.  She did have left ventricular hypertrophy.  There was akinesis of the apical myocardium.  Her LifeVest was discontinued.  Followup blood work on May 25, 2013.  This showed markedly improved lipid status on her atorvastatin 80 mg with her total cholesterol improving from 259 to150 and your LDL cholesterol from 184 to 86.  HDL was 50,  triglycerides 68.  Since I last saw her 6 months ago, she has been without recurrent chest pain.  She denies palpitations.  She denies PND, orthopnea.  Subsequent blood work showed improvement in her cholesterol from 203 04/10/1945, and LDL from 1:30 03/11/1977.  On her increased dose of atorvastatin.  She has been on amlodipine 10 mg chlorthalidone 12.5 milligrams, losartan 100 mg and carvedilol 37.5 mg twice a day for blood pressure control.  She is diabetic on insulin.  She continues to be on dual antiplatelets therapy.   Past Medical History  Diagnosis Date  . BENIGN NEOPLASM OF SKIN SITE UNSPECIFIED 09/08/2008  . CEREBROVASCULAR DISEASE 12/11/2008  . DIABETES MELLITUS, TYPE II 10/12/2006  . HYPERLIPIDEMIA 10/12/2006  . HYPERTENSION 10/12/2006  . PERIPHERAL NEUROPATHY 10/21/2006  . Endometriosis   . Fibroids     Past Surgical History  Procedure Laterality Date  . Abdominal hysterectomy    . Left heart catheterization with coronary angiogram N/A 03/24/2013    Procedure: LEFT HEART CATHETERIZATION WITH CORONARY ANGIOGRAM;  Surgeon: Troy Sine, MD;  Location: [facility] CATH LAB;  Service: Cardiovascular;  Laterality: N/A;    No Known Allergies  Current Outpatient Prescriptions  Medication Sig Dispense Refill  . acetaminophen (TYLENOL) 650 MG CR tablet Take 1,300 mg by mouth daily as needed for pain.     Marland Kitchen amLODipine (NORVASC) 10 MG tablet TAKE ONE TABLET BY MOUTH ONCE DAILY 90 tablet 1  . aspirin EC 81 MG tablet Take 81 mg by mouth daily.    Marland Kitchen atorvastatin (LIPITOR) 80 MG tablet TAKE  ONE TABLET BY MOUTH AT BEDTIME 30 tablet 1  . carvedilol (COREG) 25 MG tablet TAKE ONE & ONE-HALF TABLETS BY MOUTH TWICE DAILY 90 tablet 8  . chlorthalidone (HYGROTON) 25 MG tablet Take 0.5 tablets (12.5 mg total) by mouth daily. 90 tablet 1  . clopidogrel (PLAVIX) 75 MG tablet TAKE ONE TABLET BY MOUTH ONCE DAILY WITH  BREAKFAST 30 tablet 1  . glucose blood (FREESTYLE TEST STRIPS) test strip Use to test blood sugar  3 times daily as instructed. Dx: E08.40 125 each 11  . glucose blood test strip 1 each by Other route 4 (four) times daily -  before meals and at bedtime. 100 each 12  . insulin aspart (NOVOLOG FLEXPEN) 100 UNIT/ML FlexPen Inject under skin 25 units before brunch, 15 units before dinner 15 mL 3  . Insulin Glargine (LANTUS SOLOSTAR) 100 UNIT/ML Solostar Pen Inject 35 Units into the skin at bedtime. 15 mL 3  . KLOR-CON M20 20 MEQ tablet TAKE ONE TABLET BY MOUTH ONCE DAILY 90 tablet 3  . losartan (COZAAR) 100 MG tablet Take 1 tablet (100 mg total) by mouth daily. 90 tablet 3  . metFORMIN (GLUCOPHAGE) 500 MG tablet Take 1,000 mg by mouth 2 (two) times daily with a meal.    . nitroGLYCERIN (NITROSTAT) 0.4 MG SL tablet Place 1 tablet (0.4 mg total) under the tongue every 5 (five) minutes x 3 doses as needed for chest pain. 15 tablet 12  . RELION SHORT PEN NEEDLES 31G X 8 MM MISC USE FOUR TIMES DAILY AS NEEDED 100 each 5   No current facility-administered medications for this visit.    Social History   Social History  . Marital Status: Single    Spouse Name: N/A  . Number of Children: N/A  . Years of Education: N/A   Occupational History  . Not on file.   Social History Main Topics  . Smoking status: Never Smoker   . Smokeless tobacco: Never Used  . Alcohol Use: No  . Drug Use: No  . Sexual Activity: Not on file   Other Topics Concern  . Not on file   Social History Narrative   Regular exercise: no   Caffeine use: ocassionally    History reviewed. No pertinent family history.  ROS General: Negative; No fevers, chills, or night sweats;  HEENT: Negative; No changes in vision or hearing, sinus congestion, difficulty swallowing Pulmonary: Negative; No cough, wheezing, shortness of breath, hemoptysis Cardiovascular: See history of present illness; No chest pain, presyncope, syncope, palpitations GI: Negative; No nausea, vomiting, diarrhea, or abdominal pain GU: Negative; No  dysuria, hematuria, or difficulty voiding Musculoskeletal: Positive for leg spasms. Hematologic/Oncology: Negative; no easy bruising, bleeding Endocrine: Positive for diabetes; no heat/cold intolerance; Neuro: Negative; no changes in balance, headaches Skin: Negative; No rashes or skin lesions Psychiatric: Negative; No behavioral problems, depression Sleep: Negative; No snoring, daytime sleepiness, hypersomnolence, bruxism, restless legs, hypnogognic hallucinations, no cataplexy Other comprehensive 14 point system review is negative.  PE BP 132/78 mmHg  Pulse 73  Ht 5' 7.5" (1.715 m)  Wt 233 lb 3.2 oz (105.779 kg)  BMI 35.96 kg/m2  Wt Readings from Last 3 Encounters:  11/16/14 233 lb 3.2 oz (105.779 kg)  09/15/14 233 lb (105.688 kg)  07/13/14 232 lb (105.235 kg)   General: Alert, oriented, no distress.  Skin: normal turgor, no rashes HEENT: Normocephalic, atraumatic. Pupils round and reactive; sclera anicteric;no lid lag. Extraocular muscles intact;; no xanthelasmas. Nose without nasal septal hypertrophy Mouth/Parynx  benign; Mallinpatti scale 3 Neck: No JVD, no carotid bruits; normal carotid upstroke Lungs: clear to ausculatation and percussion; no wheezing or rales Chest wall: no tenderness to palpitation Heart: RRR, s1 s2 normal; 1/6 systolic murmur; no S3 or S4 gallop no diastolic murmur, rub thrills or heaves Abdomen: soft, nontender; no hepatosplenomehaly, BS+; abdominal aorta nontender and not dilated by palpation. Back: no CVA tenderness Pulses 2+ Extremities: no clubbing cyanosis or edema, Homan's sign negative  Neurologic: grossly nonfocal; cranial nerves grossly normal. Psychologic: normal affect and mood.  ECG (independently read by me): Normal sinus rhythm at 73 bpm.  Evidence for prior anterolateral MI.  Left axis deviation.  Inferior Q waves in 3 and aVF.  February 2016 ECG (independently read by me): Normal sinus rhythm at 79 bpm.  Inferior Q waves in lead III  and F.  Poor anterior R-wave progression.  August 2015 ECG (independently read by me): Sinus rhythm at 73 beats per minute.  Probably a progression anteriorly, concordant with her prior MI. QTc interval 449 ms  06/30/2013 ECG today (independently read by me): Sinus rhythm at 79 beats per minute.  Poor with progression anteriorly.  Prior ECG (independently read by me): Normal sinus rhythm at 86 beats per minute anterior Q waves with her MI. QTc interval 459 ms   LABS:  BMET  BMP Latest Ref Rng 08/22/2014 05/03/2014 04/14/2014  Glucose 70 - 99 mg/dL 133(H) 117(H) 215(H)  BUN 6 - 23 mg/dL 27(H) 19 27(H)  Creatinine 0.50 - 1.10 mg/dL 1.36(H) 1.16(H) 1.20(H)  Sodium 135 - 145 mEq/L 138 140 139  Potassium 3.5 - 5.3 mEq/L 3.9 4.7 4.0  Chloride 96 - 112 mEq/L 100 103 100  CO2 19 - 32 mEq/L 29 26 -  Calcium 8.4 - 10.5 mg/dL 9.6 9.4 -     Hepatic Function Panel   Hepatic Function Latest Ref Rng 08/22/2014 05/03/2014 05/25/2013  Total Protein 6.0 - 8.3 g/dL 7.5 7.5 7.8  Albumin 3.5 - 5.2 g/dL 4.1 4.2 4.5  AST 0 - 37 U/L '13 15 16  ' ALT 0 - 35 U/L '12 10 12  ' Alk Phosphatase 39 - 117 U/L 50 43 57  Total Bilirubin 0.2 - 1.2 mg/dL 0.3 0.5 0.5  Bilirubin, Direct 0.0 - 0.3 mg/dL - - -    CBC  CBC Latest Ref Rng 04/14/2014 05/25/2013 03/27/2013  WBC 4.0 - 10.5 K/uL - 7.2 9.6  Hemoglobin 12.0 - 15.0 g/dL 13.3 11.9(L) 10.9(L)  Hematocrit 36.0 - 46.0 % 39.0 35.6(L) 33.5(L)  Platelets 150 - 400 K/uL - 222 207     BNP No results found for: PROBNP  Lipid Panel     Component Value Date/Time   CHOL 147 08/22/2014 0912   TRIG 78 08/22/2014 0912   HDL 52 08/22/2014 0912   CHOLHDL 2.8 08/22/2014 0912   VLDL 16 08/22/2014 0912   LDLCALC 79 08/22/2014 0912     RADIOLOGY: No results found.    ASSESSMENT AND PLAN: Ms. Tamelia Michalowski is a 54 year old African-American female who suffered an ST segment elevation myocardial infarction and presented urgently to the catheterization laboratory in the  morning of 03/24/2013. She was found to have subtotal 99% LAD stenosis which was successfully stented with a DES stent.  Over the past year, her medications have been titrated to her current levels and her LV function has improved following revascularization, and maximal medical therapy.  She continues to be stable without recurrent anginal symptomatology or signs of CHF.  Her heart  function has improved leading to discontinuance of her LifeVest.  She is maintained on dual antiplatelet therapy with aspirin and Plavix.  Her blood pressure today is controlled on losartan 100 mg daily, Hygroton 12.5 mg, carvedilol 37.5 mg twice a day.  Amlodipine 10 mg.  She is on lipid lowering therapy with atorvastatin 80 mg.  She is diabetic on metformin and insulin.  Her weight is fairly constant at 233 and she is moderately obese with a body mass index of 35.6 kg/m.  Weight loss was recommended.  I reviewed her recent laboratory.  She denies any palpitations.  She denies difficulty with sleep.  As long as she remains stable, I'll see her in 6 months for reevaluation.  Time spent: 25 minutes  Troy Sine, MD, [facility]  11/16/2014 11:37 AM

## 2014-11-16 NOTE — Patient Instructions (Signed)
Your physician wants you to follow-up in: 6 months or sooner if needed. You will receive a reminder letter in the mail two months in advance. If you don't receive a letter, please call our office to schedule the follow-up appointment. 

## 2014-12-07 ENCOUNTER — Other Ambulatory Visit: Payer: Self-pay | Admitting: Internal Medicine

## 2014-12-18 ENCOUNTER — Ambulatory Visit: Payer: Medicare Other | Admitting: Internal Medicine

## 2015-01-02 ENCOUNTER — Other Ambulatory Visit: Payer: Self-pay | Admitting: *Deleted

## 2015-01-02 ENCOUNTER — Encounter: Payer: Self-pay | Admitting: Internal Medicine

## 2015-01-02 ENCOUNTER — Telehealth: Payer: Self-pay | Admitting: Internal Medicine

## 2015-01-02 ENCOUNTER — Ambulatory Visit (INDEPENDENT_AMBULATORY_CARE_PROVIDER_SITE_OTHER): Payer: Medicare Other | Admitting: Internal Medicine

## 2015-01-02 VITALS — BP 118/68 | HR 73 | Temp 97.9°F | Resp 12 | Wt 231.0 lb

## 2015-01-02 DIAGNOSIS — E0849 Diabetes mellitus due to underlying condition with other diabetic neurological complication: Secondary | ICD-10-CM

## 2015-01-02 DIAGNOSIS — I25810 Atherosclerosis of coronary artery bypass graft(s) without angina pectoris: Secondary | ICD-10-CM | POA: Diagnosis not present

## 2015-01-02 DIAGNOSIS — Z794 Long term (current) use of insulin: Secondary | ICD-10-CM

## 2015-01-02 MED ORDER — INSULIN GLARGINE 100 UNIT/ML SOLOSTAR PEN: 35.0000 [IU] | PEN_INJECTOR | Freq: Every day | SUBCUTANEOUS | Status: DC

## 2015-01-02 MED ORDER — INSULIN DETEMIR 100 UNIT/ML FLEXPEN: 35.0000 [IU] | PEN_INJECTOR | Freq: Every day | SUBCUTANEOUS | Status: DC

## 2015-01-02 NOTE — Progress Notes (Addendum)
Patient ID: Lynn Hobbs, female   DOB: 12/04/1960, 54 y.o.   MRN: [id number]  HPI: Lynn Hobbs is a 54 y.o.-year-old female, returning for f/u of DM2, dx 2004, insulin-dependent, uncontrolled, with complications (?peripheral neuropathy, STEMI 03/2013, cerebrovascular ds, DR). She is here with her mother, who is also diabetic, and offers part of the history. Last visit 3 mo ago.   Last hemoglobin A1c was: Fructosamine (09/15/2014) >> HbA1c 8.05% Fructosamine (05/31/2014) >> HbA1c 7.5% Fructosamine (03/01/2014) >> HbA1c 9.36%. Lab Results  Component Value Date   HGBA1C 11.0* 03/01/2014   HGBA1C 10.4* 11/30/2013   HGBA1C 9.5* 08/30/2013   She is on: - Metformin 1000 mg 2x a day - Lantus 30 >> 35 units at night - Novolog 25 in am and 20 >> 15 before dinner  - 7 units of NovoLog with snacks >15 g of carbs- but has not been snacking We stopped Invokana 100 mg daily >> urinating a lot at night >> impaired sleep.  Pt checks her sugars 5x a day and they are (per log): - am:  57-128 (most 90-100) >> 49-124 >> 114-185 >>106-142, 153, 180 >> 79, 87-124 >> 96-127 >> 79, 101-140 - 2h after b'fast: 101, 95 >> 102-150 >> 49x1, 66-153 >> 111, 135-172, 206 >> 121-144 >> 108-137 >> 97-160, 170 - before lunch:104-178 (sometimes eats popcorn before this meal) >> 128-174 >> no lunch, has brunch - 2h after lunch: 89-168 >> 145-192 >> 87-181 >> >> no lunch, has brunch - before dinner: 83-148, 175x1 >> 127-167 >> 137-188, 200 >> 126-178, 199 >> 125-150, 184 >> 128-187 - 2h after dinner: 96, 108 >> 140 >> n/c >> 84 >> 159-196 >> 203 >> 126-164 >> 125-170 >> 140-202 - bedtime: 49, 60, 62, 77, 101 >> n/c >> 75-139 >> 87-125 >> 87-137 >> 71-100 >> 73-100 >> 69-107, 137 - nighttime: 54 x 1 >> n/c Lowest sugar was 74 >> 71 >> 63; she has hypoglycemia awareness at 60. Highest sugar was 202 in last month.  Meals: - b'fast ~ 10 am - dinner ~2:30-3 pm Afterthis, she eats very light, usually fruit.  - mild  CKD, last BUN/creatinine:  Lab Results  Component Value Date   BUN 27* 08/22/2014   CREATININE 1.36* 08/22/2014  Last ACR high (12/02/2012): 53.8, previously 64.6. She is on Losartan. - last set of lipids: Lab Results  Component Value Date   CHOL 147 08/22/2014   HDL 52 08/22/2014   LDLCALC 79 08/22/2014   LDLDIRECT 214.4 12/02/2012   TRIG 78 08/22/2014   CHOLHDL 2.8 08/22/2014  She is on  Atorvastatin 80. - last eye exam was in 11/01/2014 - Dr. Tempie Hoist. 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 She had Laser Sx in the past. - no numbness and tingling in her feet. She is on aspirin 81 mg.  She has a history of HTN, HL. She had a STEMI  (03/2013) >> midLAD stent placed. She is on Plavix and beta blocker. Cardiologist: Dr Claiborne Billings.  ROS: Constitutional: no weight loss/gain, + fatigue, no subjective hyperthermia/hypothermia Eyes: no blurry vision, no xerophthalmia ENT: no sore throat, no nodules palpated in throat, no dysphagia/odynophagia, no hoarseness Cardiovascular: no CP/SOB/palpitations/leg swelling Respiratory: no cough/SOB Gastrointestinal: no N/V/D/C Musculoskeletal: no muscle/joint aches Skin: no rashes  I reviewed pt's medications, allergies, PMH, social hx, family hx, and changes were documented in the history of present illness. Otherwise, unchanged from my initial visit note.   PE: BP 118/68 mmHg  Pulse 73  Temp(Src) 97.9 F (  36.6 C) (Oral)  Resp 12  Wt 231 lb (104.781 kg)  SpO2 99% Body mass index is 35.62 kg/(m^2).  Wt Readings from Last 3 Encounters:  01/02/15 231 lb (104.781 kg)  11/16/14 233 lb 3.2 oz (105.779 kg)  09/15/14 233 lb (105.688 kg)   Constitutional: overweight, in NAD, uses cane Eyes: PERRLA, EOMI, no exophthalmos ENT: moist mucous membranes, no thyromegaly, no cervical lymphadenopathy Cardiovascular: RRR, No MRG Respiratory: CTA B Gastrointestinal: abdomen soft, NT, ND, BS+ Musculoskeletal: left arm and leg paresis Skin: moist, warm, no rashes Neurological: no  tremor with outstretched hands, DTR normal in all 4  ASSESSMENT: 1. DM2, insulin-dependent, uncontrolled, with complications - ? PN - on Neurontin - cerebrovascular ds. 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 Patient with long-standing, uncontrolled diabetes, with slightly higher HbA1c at last visit. Sugars are higher before dinner >> advised her to pay attention to snacks in the pm. She also has higher sugars 2h after dinner and lower sugars at bedtime >> I advised her to move Novolog before dinner earlier, but I will not change the doses for now. - I suggested to :  Patient Instructions  Please continue: - Metformin 1000 mg 2x a day - Lantus to 35 units at night.  - Novolog 25 in am and 15 before dinner - Please move the Novolog to 20-30 min before dinner. Continue adding 7 units of NovoLog before popcorn.   Please come back in 3 months  Please stop at the lab.  - continue checking her sugars at different times of the day - check  3 x a day - given new sugar logs - refuses flu shot today - she is up to date with eye exams - will check fructosamine today - Return to clinic in 3 months with sugar log  Office Visit on 01/02/2015  Component Date Value Ref Range Status  . Fructosamine 01/02/2015 372* 190 - 270 umol/L Final   HbA1c calculated from fructosamine: 7.9% Will advise her to bolus 5-7 units of NovoLog before any snack with more than 15 g of carbs that she might have before dinner.

## 2015-01-02 NOTE — Patient Instructions (Signed)
Please continue: - Metformin 1000 mg 2x a day - Lantus to 35 units at night.  - Novolog to 25 in am and 15 before dinner - Please move the Novolog to 20-30 min before dinner. Continue adding 7 units of NovoLog before popcorn.   Please come back in 3 months  Please stop at the lab.

## 2015-01-02 NOTE — Telephone Encounter (Signed)
Patient stated that her lantus solostar  ins will not cover but will cover levemir please advise. [facility] [place]

## 2015-01-03 NOTE — Telephone Encounter (Signed)
Called pt and advised her that Dr Cruzita Lederer switched to the Levemir and we sent an rx to [place] [facility]. She voiced understanding.

## 2015-01-07 ENCOUNTER — Other Ambulatory Visit: Payer: Self-pay | Admitting: Cardiovascular Disease

## 2015-01-08 LAB — FRUCTOSAMINE: FRUCTOSAMINE: 372 umol/L — AB (ref 190–270)

## 2015-01-17 ENCOUNTER — Ambulatory Visit: Payer: Medicare Other | Admitting: Internal Medicine

## 2015-01-23 ENCOUNTER — Encounter: Payer: Self-pay | Admitting: Cardiovascular Disease

## 2015-02-13 ENCOUNTER — Encounter: Payer: Self-pay | Admitting: Internal Medicine

## 2015-02-13 ENCOUNTER — Ambulatory Visit (INDEPENDENT_AMBULATORY_CARE_PROVIDER_SITE_OTHER): Payer: Medicare Other | Admitting: Internal Medicine

## 2015-02-13 VITALS — BP 110/64 | HR 73 | Temp 98.4°F | Resp 20 | Ht 67.5 in | Wt 233.0 lb

## 2015-02-13 DIAGNOSIS — I10 Essential (primary) hypertension: Secondary | ICD-10-CM | POA: Diagnosis not present

## 2015-02-13 DIAGNOSIS — E785 Hyperlipidemia, unspecified: Secondary | ICD-10-CM | POA: Diagnosis not present

## 2015-02-13 DIAGNOSIS — E0840 Diabetes mellitus due to underlying condition with diabetic neuropathy, unspecified: Secondary | ICD-10-CM

## 2015-02-13 DIAGNOSIS — I25810 Atherosclerosis of coronary artery bypass graft(s) without angina pectoris: Secondary | ICD-10-CM

## 2015-02-13 DIAGNOSIS — I252 Old myocardial infarction: Secondary | ICD-10-CM

## 2015-02-13 LAB — MICROALBUMIN / CREATININE URINE RATIO
Creatinine,U: 174.5 mg/dL
MICROALB UR: 4.7 mg/dL — AB (ref 0.0–1.9)
Microalb Creat Ratio: 2.7 mg/g (ref 0.0–30.0)

## 2015-02-13 LAB — HEMOGLOBIN A1C: HEMOGLOBIN A1C: 8.4 % — AB (ref 4.6–6.5)

## 2015-02-13 MED ORDER — CHLORTHALIDONE 25 MG PO TABS: 12.5000 mg | ORAL_TABLET | Freq: Every day | ORAL | Status: DC

## 2015-02-13 MED ORDER — DIPHENOXYLATE-ATROPINE 2.5-0.025 MG PO TABS: 1.0000 | ORAL_TABLET | Freq: Four times a day (QID) | ORAL | Status: DC | PRN

## 2015-02-13 NOTE — Progress Notes (Signed)
Subjective:    Patient ID: Lynn Hobbs, female    DOB: [birth year], 54 y.o.   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  She is followed by endocrinology.  States her glycemic control has done well. She is followed by cardiology.  Cardiac status has been stable.  Recent fructosamine level 372   Past Medical History  Diagnosis Date  . BENIGN NEOPLASM OF SKIN SITE UNSPECIFIED 09/08/2008  . CEREBROVASCULAR DISEASE 12/11/2008  . DIABETES MELLITUS, TYPE II 10/12/2006  . HYPERLIPIDEMIA 10/12/2006  . HYPERTENSION 10/12/2006  . PERIPHERAL NEUROPATHY 10/21/2006  . Endometriosis   . Fibroids     Social History   Social History  . Marital Status: Single    Spouse Name: N/A  . Number of Children: N/A  . Years of Education: N/A   Occupational History  . Not on file.   Social History Main Topics  . Smoking status: Never Smoker   . Smokeless tobacco: Never Used  . Alcohol Use: No  . Drug Use: No  . Sexual Activity: Not on file   Other Topics Concern  . Not on file   Social History Narrative   Regular exercise: no   Caffeine use: ocassionally    Past Surgical History  Procedure Laterality Date  . Abdominal hysterectomy    . Left heart catheterization with coronary angiogram N/A 03/24/2013    Procedure: LEFT HEART CATHETERIZATION WITH CORONARY ANGIOGRAM;  Surgeon: Troy Sine, MD;  Location: [facility] CATH LAB;  Service: Cardiovascular;  Laterality: N/A;    No family history on file.  No Known Allergies  Current Outpatient Prescriptions on File Prior to Visit  Medication Sig Dispense Refill  . acetaminophen (TYLENOL) 650 MG CR tablet Take 1,300 mg by mouth daily as needed for pain.     Marland Kitchen amLODipine (NORVASC) 10 MG tablet TAKE ONE TABLET BY MOUTH ONCE DAILY 90 tablet 1  . aspirin EC 81 MG tablet Take 81 mg by mouth daily.    Marland Kitchen  atorvastatin (LIPITOR) 80 MG tablet TAKE ONE TABLET BY MOUTH AT BEDTIME 30 tablet 9  . carvedilol (COREG) 25 MG tablet TAKE ONE & ONE-HALF TABLETS BY MOUTH TWICE DAILY 90 tablet 8  . chlorthalidone (HYGROTON) 25 MG tablet Take 0.5 tablets (12.5 mg total) by mouth daily. 90 tablet 1  . clopidogrel (PLAVIX) 75 MG tablet TAKE ONE TABLET BY MOUTH ONCE DAILY WITH BREAKFAST 30 tablet 9  . glucose blood (FREESTYLE TEST STRIPS) test strip Use to test blood sugar 3 times daily as instructed. Dx: E08.40 125 each 11  . glucose blood test strip 1 each by Other route 4 (four) times daily -  before meals and at bedtime. 100 each 12  . insulin aspart (NOVOLOG FLEXPEN) 100 UNIT/ML FlexPen Inject under skin 25 units before brunch, 15 units before dinner 15 mL 3  . Insulin Detemir (LEVEMIR FLEXTOUCH) 100 UNIT/ML Pen Inject 35 Units into the skin at bedtime. 15 mL 2  . KLOR-CON M20 20 MEQ tablet TAKE ONE TABLET BY MOUTH ONCE DAILY 90 tablet 3  . losartan (COZAAR) 100 MG tablet Take 1 tablet (100 mg total) by mouth daily. 90 tablet 3  . metFORMIN (GLUCOPHAGE) 500 MG tablet Take 1,000 mg by mouth 2 (two) times daily with a meal.    . nitroGLYCERIN (NITROSTAT) 0.4 MG SL tablet Place 1 tablet (  0.4 mg total) under the tongue every 5 (five) minutes x 3 doses as needed for chest pain. 15 tablet 12  . RELION SHORT PEN NEEDLES 31G X 8 MM MISC USE FOUR TIMES DAILY AS NEEDED 100 each 5   No current facility-administered medications on file prior to visit.    BP 110/64 mmHg  Pulse 73  Temp(Src) 98.4 F (36.9 C) (Oral)  Resp 20  Ht 5' 7.5" (1.715 m)  Wt 233 lb (105.688 kg)  BMI 35.93 kg/m2  SpO2 99%     Review of Systems  Constitutional: Negative.   HENT: Negative for congestion, dental problem, hearing loss, rhinorrhea, sinus pressure, sore throat and tinnitus.   Eyes: Negative for pain, discharge and visual disturbance.  Respiratory: Negative for cough and shortness of breath.   Cardiovascular: Negative for  chest pain, palpitations and leg swelling.  Gastrointestinal: Positive for diarrhea. Negative for nausea, vomiting, abdominal pain, constipation, blood in stool and abdominal distention.  Genitourinary: Negative for dysuria, urgency, frequency, hematuria, flank pain, vaginal bleeding, vaginal discharge, difficulty urinating, vaginal pain and pelvic pain.  Musculoskeletal: Positive for gait problem. Negative for joint swelling and arthralgias.  Skin: Negative for rash.  Neurological: Negative for dizziness, syncope, speech difficulty, weakness, numbness and headaches.  Hematological: Negative for adenopathy.  Psychiatric/Behavioral: Negative for behavioral problems, dysphoric mood and agitation. The patient is not nervous/anxious.        Objective:   Physical Exam  Constitutional: She is oriented to person, place, and time. She appears well-developed and well-nourished.  Walks with a cane Blood pressure low normal  HENT:  Head: Normocephalic.  Right Ear: External ear normal.  Left Ear: External ear normal.  Mouth/Throat: Oropharynx is clear and moist.  Eyes: Conjunctivae and EOM are normal. Pupils are equal, round, and reactive to light.  Neck: Normal range of motion. Neck supple. No thyromegaly present.  Cardiovascular: Normal rate, regular rhythm, normal heart sounds and intact distal pulses.   Pulmonary/Chest: Effort normal and breath sounds normal.  Abdominal: Soft. Bowel sounds are normal. She exhibits no mass. There is no tenderness.  Musculoskeletal: Normal range of motion.  Lymphadenopathy:    She has no cervical adenopathy.  Neurological: She is alert and oriented to person, place, and time.  Skin: Skin is warm and dry. No rash noted.  Psychiatric: She has a normal mood and affect. Her behavior is normal.          Assessment & Plan:     Diabetes mellitus.  Will check a hemoglobin A1c and urine for microalbumin Coronary artery disease, stable Essential hypertension,  stable Dyslipidemia.  Continue statin therapy  Follow-up cardiology and endocrine  Recheck here 6 months or as needed

## 2015-02-13 NOTE — Progress Notes (Signed)
Pre visit review using our clinic review tool, if applicable. No additional management support is needed unless otherwise documented below in the visit note. 

## 2015-02-13 NOTE — Patient Instructions (Signed)
Limit your sodium (Salt) intake  Return in 6 months for follow-up  Endocrinology and cardiology follow-up as scheduled

## 2015-03-05 ENCOUNTER — Other Ambulatory Visit: Payer: Self-pay | Admitting: Internal Medicine

## 2015-04-04 ENCOUNTER — Ambulatory Visit (INDEPENDENT_AMBULATORY_CARE_PROVIDER_SITE_OTHER): Payer: Medicare Other | Admitting: Internal Medicine

## 2015-04-04 ENCOUNTER — Encounter: Payer: Self-pay | Admitting: Internal Medicine

## 2015-04-04 VITALS — BP 130/82 | HR 76 | Temp 97.6°F | Resp 12 | Wt 235.0 lb

## 2015-04-04 DIAGNOSIS — Z794 Long term (current) use of insulin: Secondary | ICD-10-CM | POA: Diagnosis not present

## 2015-04-04 DIAGNOSIS — E1140 Type 2 diabetes mellitus with diabetic neuropathy, unspecified: Secondary | ICD-10-CM

## 2015-04-04 MED ORDER — INSULIN DETEMIR 100 UNIT/ML FLEXPEN: 32.0000 [IU] | PEN_INJECTOR | Freq: Every day | SUBCUTANEOUS | Status: DC

## 2015-04-04 MED ORDER — INSULIN ASPART 100 UNIT/ML FLEXPEN: PEN_INJECTOR | SUBCUTANEOUS | Status: DC

## 2015-04-04 NOTE — Progress Notes (Signed)
Patient ID: Lynn Hobbs, female   DOB: 07/09/1960, 55 y.o.   MRN: [id number]  HPI: Lynn Hobbs is a 55 y.o.-year-old female, returning for f/u of DM2, dx 2004, insulin-dependent, uncontrolled, with complications (?peripheral neuropathy, STEMI 03/2013, cerebrovascular ds, DR). She is here with her mother, who is also diabetic, and offers part of the history. Last visit 3 mo ago.   She changed her diet since last visit: cut down sweets, no bread, no popcorn! Sugars almost all at goal!  Last hemoglobin A1c was: Fructosamine (01/02/2015) >> HbA1c 7.9% Fructosamine (09/15/2014) >> HbA1c 8.05% Fructosamine (05/31/2014) >> HbA1c 7.5% Fructosamine (03/01/2014) >> HbA1c 9.36%. Lab Results  Component Value Date   HGBA1C 8.4* 02/13/2015   HGBA1C 11.0* 03/01/2014   HGBA1C 10.4* 11/30/2013   She is on: - Metformin 1000 mg 2x a day - Levemir 35 units at night - Novolog 25 in am and 15 before dinner  - 7 units of NovoLog with snacks >15 g of carbs- but has not been snacking We stopped Invokana 100 mg daily >> urinating a lot at night >> impaired sleep.  Pt checks her sugars 5x a day and they are (per log): - am:  57-128 (most 90-100) >> 49-124 >> 114-185 >>106-142, 153, 180 >> 79, 87-124 >> 96-127 >> 79, 101-140 >> 70-101 - 2h after b'fast: 101, 95 >> 102-150 >> 49x1, 66-153 >> 111, 135-172, 206 >> 121-144 >> 108-137 >> 97-160, 170 >> 101-138 - before lunch:104-178 (sometimes eats popcorn before this meal) >> 128-174 >> no lunch, has brunch - 2h after lunch: 89-168 >> 145-192 >> 87-181 >> >> no lunch, has brunch - before dinner: 83-148, 175x1 >> 127-167 >> 137-188, 200 >> 126-178, 199 >> 125-150, 184 >> 128-187 >> 88-139 - 2h after dinner: 96, 108 >> 140 >> n/c >> 84 >> 159-196 >> 203 >> 126-164 >> 125-170 >> 140-202 >> 122-150 - bedtime: 49, 60, 62, 77, 101 >> n/c >> 75-139 >> 87-125 >> 87-137 >> 71-100 >> 73-100 >> 69-107, 137 >> 74-104 - nighttime: 54 x 1 >> n/c >> 100 Lowest sugar was 74  >> 71 >> 63; she has hypoglycemia awareness at 60 >> 70. Highest sugar was 202 >> 150in last month.  Meals: - b'fast ~ 10 am - dinner ~2:30-3 pm Afterthis, she eats very light, usually fruit.  - mild CKD, last BUN/creatinine:  Lab Results  Component Value Date   BUN 27* 08/22/2014   CREATININE 1.36* 08/22/2014  Last ACR normal: Microalb, Ur 02/13/2015 4.7* 0.0 - 1.9 mg/dL  Creatinine,U 02/13/2015 174.5    Microalb Creat Ratio 02/13/2015 2.7  0.0 - 30.0 mg/g  She is on Losartan. - last set of lipids: Lab Results  Component Value Date   CHOL 147 08/22/2014   HDL 52 08/22/2014   LDLCALC 79 08/22/2014   LDLDIRECT 214.4 12/02/2012   TRIG 78 08/22/2014   CHOLHDL 2.8 08/22/2014  She is on  Atorvastatin 80. - last eye exam was in 11/01/2014 - Dr. Tempie Hoist. 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 She had Laser Sx in the past. - no numbness and tingling in her feet. She is on aspirin 81 mg.  She has a history of HTN, HL. She had a STEMI  (03/2013) >> midLAD stent placed. She is on Plavix and beta blocker. Cardiologist: Dr Claiborne Billings.  ROS: Constitutional: no weight loss/gain, no fatigue, no subjective hyperthermia/hypothermia Eyes: no blurry vision, no xerophthalmia ENT: no sore throat, no nodules palpated in throat, no dysphagia/odynophagia, no hoarseness  Cardiovascular: no CP/SOB/palpitations/leg swelling Respiratory: no cough/SOB Gastrointestinal: no N/V/D/C Musculoskeletal: no muscle/joint aches Skin: no rashes  I reviewed pt's medications, allergies, PMH, social hx, family hx, and changes were documented in the history of present illness. Otherwise, unchanged from my initial visit note.   PE: BP 130/82 mmHg  Pulse 76  Temp(Src) 97.6 F (36.4 C) (Oral)  Resp 12  Wt 235 lb (106.595 kg)  SpO2 99% Body mass index is 36.24 kg/(m^2).  Wt Readings from Last 3 Encounters:  04/04/15 235 lb (106.595 kg)  02/13/15 233 lb (105.688 kg)  01/02/15 231 lb (104.781 kg)   Constitutional: overweight, in NAD,  uses cane Eyes: PERRLA, EOMI, no exophthalmos ENT: moist mucous membranes, no thyromegaly, no cervical lymphadenopathy Cardiovascular: RRR, No MRG Respiratory: CTA B Gastrointestinal: abdomen soft, NT, ND, BS+ Musculoskeletal: left arm and leg paresis Skin: moist, warm, no rashes Neurological: no tremor with outstretched hands, DTR normal in all 4  ASSESSMENT: 1. DM2, insulin-dependent, uncontrolled, with complications - ? PN - on Neurontin - cerebrovascular ds. 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 Patient with long-standing, uncontrolled diabetes, with greatly improved sugars since last visit. Almost all sugars at goal after improving diet. She has low sugars at bedtime and in am >> will reduce all insulin doses. I congratulated her for her success! - I suggested to:  Patient Instructions  Please continue: - Metformin 1000 mg 2x a day  Decrease: - Levemir to 32 units at night.  - Novolog to 22 in am and 10 before dinner   Please come back in 3-4 months  Please stop at the lab.  KEEP UP THE GREAT WORK!  - continue checking her sugars at different times of the day - check  3 x a day - given new sugar logs - refused flu shot  - she is up to date with eye exams - will check fructosamine today - Return to clinic in 3 months with sugar log  Office Visit on 04/04/2015  Component Date Value Ref Range Status  . Fructosamine 04/04/2015 336* 190 - 270 umol/L Final   Hemoglobin A1c calculated from the fructosamine is 7.3%, which is much better than the 8.4% there was directly checked, and much better than the 7.9% we obtained from last fructosamine check.

## 2015-04-04 NOTE — Patient Instructions (Signed)
Please continue: - Metformin 1000 mg 2x a day  Decrease: - Levemir to 32 units at night.  - Novolog to 22 in am and 10 before dinner   Please come back in 3-4 months  Please stop at the lab.  KEEP UP THE GREAT WORK!

## 2015-04-09 LAB — FRUCTOSAMINE: FRUCTOSAMINE: 336 umol/L — AB (ref 190–270)

## 2015-05-07 ENCOUNTER — Ambulatory Visit (INDEPENDENT_AMBULATORY_CARE_PROVIDER_SITE_OTHER): Payer: Medicare Other | Admitting: Ophthalmology

## 2015-05-07 DIAGNOSIS — I10 Essential (primary) hypertension: Secondary | ICD-10-CM | POA: Diagnosis not present

## 2015-05-07 DIAGNOSIS — H35033 Hypertensive retinopathy, bilateral: Secondary | ICD-10-CM

## 2015-05-07 DIAGNOSIS — H43813 Vitreous degeneration, bilateral: Secondary | ICD-10-CM | POA: Diagnosis not present

## 2015-05-07 DIAGNOSIS — E11319 Type 2 diabetes mellitus with unspecified diabetic retinopathy without macular edema: Secondary | ICD-10-CM

## 2015-05-07 DIAGNOSIS — E113593 Type 2 diabetes mellitus with proliferative diabetic retinopathy without macular edema, bilateral: Secondary | ICD-10-CM | POA: Diagnosis not present

## 2015-05-07 DIAGNOSIS — H2513 Age-related nuclear cataract, bilateral: Secondary | ICD-10-CM | POA: Diagnosis not present

## 2015-05-07 LAB — HM DIABETES EYE EXAM

## 2015-05-10 ENCOUNTER — Encounter: Payer: Self-pay | Admitting: Internal Medicine

## 2015-06-10 ENCOUNTER — Other Ambulatory Visit: Payer: Self-pay | Admitting: Internal Medicine

## 2015-06-10 ENCOUNTER — Other Ambulatory Visit: Payer: Self-pay | Admitting: Cardiovascular Disease

## 2015-06-19 ENCOUNTER — Other Ambulatory Visit: Payer: Self-pay | Admitting: *Deleted

## 2015-06-19 ENCOUNTER — Telehealth: Payer: Self-pay | Admitting: Internal Medicine

## 2015-06-19 MED ORDER — INSULIN ASPART 100 UNIT/ML FLEXPEN: PEN_INJECTOR | SUBCUTANEOUS | Status: DC

## 2015-06-19 NOTE — Telephone Encounter (Signed)
Pt needs refill on novolog rx called to [facility] in [place]

## 2015-06-19 NOTE — Telephone Encounter (Signed)
Refill sent into pt's pharmacy 

## 2015-07-04 ENCOUNTER — Encounter: Payer: Self-pay | Admitting: Internal Medicine

## 2015-07-04 ENCOUNTER — Ambulatory Visit (INDEPENDENT_AMBULATORY_CARE_PROVIDER_SITE_OTHER): Payer: Medicare Other | Admitting: Internal Medicine

## 2015-07-04 VITALS — BP 122/80 | HR 83 | Temp 98.4°F | Resp 12 | Wt 233.0 lb

## 2015-07-04 DIAGNOSIS — Z794 Long term (current) use of insulin: Secondary | ICD-10-CM | POA: Diagnosis not present

## 2015-07-04 DIAGNOSIS — R5383 Other fatigue: Secondary | ICD-10-CM | POA: Diagnosis not present

## 2015-07-04 DIAGNOSIS — E1140 Type 2 diabetes mellitus with diabetic neuropathy, unspecified: Secondary | ICD-10-CM

## 2015-07-04 LAB — T3, FREE: T3 FREE: 2.4 pg/mL (ref 2.3–4.2)

## 2015-07-04 LAB — VITAMIN B12: VITAMIN B 12: 416 pg/mL (ref 211–911)

## 2015-07-04 LAB — TSH: TSH: 1.61 u[IU]/mL (ref 0.35–4.50)

## 2015-07-04 LAB — CBC
HCT: 33.8 % — ABNORMAL LOW (ref 36.0–46.0)
Hemoglobin: 11.2 g/dL — ABNORMAL LOW (ref 12.0–15.0)
MCHC: 33 g/dL (ref 30.0–36.0)
MCV: 74 fl — AB (ref 78.0–100.0)
Platelets: 200 10*3/uL (ref 150.0–400.0)
RBC: 4.57 Mil/uL (ref 3.87–5.11)
RDW: 16.5 % — ABNORMAL HIGH (ref 11.5–15.5)
WBC: 7.5 10*3/uL (ref 4.0–10.5)

## 2015-07-04 LAB — T4, FREE: FREE T4: 0.89 ng/dL (ref 0.60–1.60)

## 2015-07-04 LAB — VITAMIN D 25 HYDROXY (VIT D DEFICIENCY, FRACTURES): VITD: 6.61 ng/mL — ABNORMAL LOW (ref 30.00–100.00)

## 2015-07-04 MED ORDER — INSULIN ASPART 100 UNIT/ML FLEXPEN: PEN_INJECTOR | SUBCUTANEOUS | Status: DC

## 2015-07-04 MED ORDER — INSULIN DETEMIR 100 UNIT/ML FLEXPEN: 36.0000 [IU] | PEN_INJECTOR | Freq: Every day | SUBCUTANEOUS | Status: DC

## 2015-07-04 NOTE — Patient Instructions (Addendum)
Please continue: - Metformin 1000 mg 2x a day  Please increase: - Levemir to 36 units at night - Novolog 22 units  in am and 12 before dinner   Please come back in 3 months.  Please let me know if the sugars are consistently <80 or >200.  Please stop at the lab.

## 2015-07-04 NOTE — Progress Notes (Signed)
Patient ID: Lynn Hobbs, female   DOB: 16-Jun-1960, 55 y.o.   MRN: [id number]  HPI: Lynn Hobbs is a 55 y.o.-year-old female, returning for f/u of DM2, dx 2004, insulin-dependent, uncontrolled, with complications (?peripheral neuropathy, STEMI 03/2013, cerebrovascular ds, DR). She is here with her mother, who is also diabetic, and offers part of the history. Last visit 3 mo ago.   She changed her diet before since last visit: cut down sweets, no bread, no popcorn! Sugars improved >> HbA1c better. However, at this visit, sugars have increased and she mentions that this is probably because she started to be very fatigued. She can barely finish a shower without being very tired. No shortness of breath, chest pain, or claudication. Sleeps fairly well.  Last hemoglobin A1c was: Fructosamine (04/04/2015) >> HbA1c 7.3% Fructosamine (01/02/2015) >> HbA1c 7.9% Fructosamine (09/15/2014) >> HbA1c 8.05% Fructosamine (05/31/2014) >> HbA1c 7.5% Fructosamine (03/01/2014) >> HbA1c 9.36%. Lab Results  Component Value Date   HGBA1C 8.4* 02/13/2015   HGBA1C 11.0* 03/01/2014   HGBA1C 10.4* 11/30/2013   She is on: - Metformin 1000 mg 2x a day - Levemir 32 units at night - Novolog 22 in am and 10 before dinner  We stopped Invokana 100 mg daily >> urinating a lot at night >> impaired sleep.  Pt checks her sugars 5x a day and they are (per log): - am:  49-124 >> 114-185 >>106-142, 153, 180 >> 79, 87-124 >> 96-127 >> 79, 101-140 >> 70-101 >> 127-147 - 2h after b'fast:49x1, 66-153 >> 111, 135-172, 206 >> 121-144 >> 108-137 >> 97-160, 170 >> 101-138 >> 128-161 - before lunch:104-178 (sometimes eats popcorn before this meal) >> 128-174 >> no lunch, has brunch  - 2h after lunch: 89-168 >> 145-192 >> 87-181 >> >> no lunch, has brunch - before dinner:127-167 >> 137-188, 200 >> 126-178, 199 >> 125-150, 184 >> 128-187 >> 88-139 >> 150-182 - 2h after dinner: 84 >> 159-196 >> 203 >> 126-164 >> 125-170 >> 140-202 >>  122-150 >> 156-200 - bedtime: 75-139 >> 87-125 >> 87-137 >> 71-100 >> 73-100 >> 69-107, 137 >> 74-104 >> 135-145 - nighttime: 54 x 1 >> n/c >> 100 Lowest sugar was 74 >> 71 >> 63; she has hypoglycemia awareness at 60 >> 70 >> 127. Highest sugar was 202 >> 150 >> 202  Meals: - b'fast ~ 10 am - dinner ~2:30-3 pm Afterthis, she eats very light, usually fruit.  - mild CKD, last BUN/creatinine:  Lab Results  Component Value Date   BUN 27* 08/22/2014   CREATININE 1.36* 08/22/2014  Last ACR normal: Microalb, Ur 02/13/2015 4.7* 0.0 - 1.9 mg/dL  Creatinine,U 02/13/2015 174.5    Microalb Creat Ratio 02/13/2015 2.7  0.0 - 30.0 mg/g  She is on Losartan. - last set of lipids: Lab Results  Component Value Date   CHOL 147 08/22/2014   HDL 52 08/22/2014   LDLCALC 79 08/22/2014   LDLDIRECT 214.4 12/02/2012   TRIG 78 08/22/2014   CHOLHDL 2.8 08/22/2014  She is on  Atorvastatin 80. - last eye exam was (Dr. Tempie Hoist. + Left DR):  Abstract on 05/10/2015  Component Date Value Ref Range Status  . HM Diabetic Eye Exam 05/07/2015 Retinopathy* No Retinopathy Final  She had Laser Sx in the past. - no numbness and tingling in her feet. She is on aspirin 81 mg.  She has a history of HTN, HL. She had a STEMI  (03/2013) >> midLAD stent placed. She is on Plavix  and beta blocker. Cardiologist: Dr Claiborne Billings.  ROS: Constitutional: no weight loss/gain, + fatigue, no subjective hyperthermia/hypothermia Eyes: no blurry vision, no xerophthalmia ENT: no sore throat, no nodules palpated in throat, no dysphagia/odynophagia, no hoarseness Cardiovascular: no CP/SOB/palpitations/leg swelling Respiratory: no cough/SOB Gastrointestinal: no N/V/D/C Musculoskeletal: no muscle/joint aches Skin: no rashes  I reviewed pt's medications, allergies, PMH, social hx, family hx, and changes were documented in the history of present illness. Otherwise, unchanged from my initial visit note.   PE: BP 122/80 mmHg  Pulse 83   Temp(Src) 98.4 F (36.9 C) (Oral)  Resp 12  Wt 233 lb (105.688 kg)  SpO2 96% Body mass index is 35.93 kg/(m^2).  Wt Readings from Last 3 Encounters:  07/04/15 233 lb (105.688 kg)  04/04/15 235 lb (106.595 kg)  02/13/15 233 lb (105.688 kg)   Constitutional: overweight, in NAD, uses cane Eyes: PERRLA, EOMI, no exophthalmos ENT: moist mucous membranes, no thyromegaly, no cervical lymphadenopathy Cardiovascular: RRR, No MRG Respiratory: CTA B Gastrointestinal: abdomen soft, NT, ND, BS+ Musculoskeletal: left arm and leg paresis Skin: moist, warm, no rashes Neurological: no tremor with outstretched hands, DTR normal in all 4  ASSESSMENT: 1. DM2, insulin-dependent, uncontrolled, with complications - ? PN - on Neurontin - cerebrovascular ds. 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 Patient with long-standing, uncontrolled diabetes, with worsening sugars since last visit. She tells me this is b/c starting to be very fatigued. Will increase the insulin for now and check poss. reasons for the fatigue. - I suggested to:  Patient Instructions  Please continue: - Metformin 1000 mg 2x a day  Please increase: - Levemir to 36 units at night - Novolog 22 units  in am and 12 before dinner   Please come back in 3 months.  Please let me know if the sugars are consistently <80 or >200.  Please stop at the lab.  - continue checking her sugars at different times of the day - check  3 x a day - given new sugar logs - refused flu shot  - she is up to date with eye exams - will check fructosamine today - Return to clinic in 3 months with sugar log   2. Fatigue - new, since last visit - no SOB/CP but feels "in slow motion".  - will check: Orders Placed This Encounter  Procedures  . T4, free  . T3, free  . TSH  . VITAMIN D 25 Hydroxy (Vit-D Deficiency, Fractures)  . Vitamin B12  . Fructosamine  . CBC    Office Visit on 07/04/2015   Component Date Value Ref Range Status  . Free T4 07/04/2015 0.89  0.60 - 1.60 ng/dL Final  . T3, Free 07/04/2015 2.4  2.3 - 4.2 pg/mL Final  . TSH 07/04/2015 1.61  0.35 - 4.50 uIU/mL Final  . VITD 07/04/2015 6.61* 30.00 - 100.00 ng/mL Final  . Vitamin B-12 07/04/2015 416  211 - 911 pg/mL Final  . WBC 07/04/2015 7.5  4.0 - 10.5 K/uL Final  . RBC 07/04/2015 4.57  3.87 - 5.11 Mil/uL Final  . Platelets 07/04/2015 200.0  150.0 - 400.0 K/uL Final  . Hemoglobin 07/04/2015 11.2* 12.0 - 15.0 g/dL Final  . HCT 07/04/2015 33.8* 36.0 - 46.0 % Final  . MCV 07/04/2015 74.0* 78.0 - 100.0 fl Final  . MCHC 07/04/2015 33.0  30.0 - 36.0 g/dL Final  . RDW 07/04/2015 16.5* 11.5 -  15.5 % Final  . Fructosamine 07/04/2015 446* 0 - 285 umol/L Final   Comment: Published reference interval for apparently healthy subjects between age 13 and 65 is 45 - 285 umol/L and in a poorly controlled diabetic population is 228 - 563 umol/L with a mean of 396 umol/L.    TFTs are normal. She has mild anemia, which is not new. Her vitamin B12 is normal. Her vitamin D is extremely low!!! - Which could explain her fatigue. We'll start ergocalciferol 50,000 units twice a week for the next 12 weeks, then continue with 5000 units daily. I will repeat her vitamin D level at next visit. Hemoglobin A1c calculated from fructosamine is much higher, at 9.2%!

## 2015-07-05 ENCOUNTER — Other Ambulatory Visit: Payer: Self-pay | Admitting: *Deleted

## 2015-07-05 LAB — FRUCTOSAMINE: Fructosamine: 446 umol/L — ABNORMAL HIGH (ref 0–285)

## 2015-07-05 MED ORDER — VITAMIN D (ERGOCALCIFEROL) 1.25 MG (50000 UNIT) PO CAPS: 50000.0000 [IU] | ORAL_CAPSULE | ORAL | Status: DC

## 2015-07-05 NOTE — Telephone Encounter (Signed)
Opened encounter in error  

## 2015-08-06 ENCOUNTER — Other Ambulatory Visit: Payer: Self-pay | Admitting: Internal Medicine

## 2015-08-12 ENCOUNTER — Other Ambulatory Visit: Payer: Self-pay | Admitting: Cardiovascular Disease

## 2015-08-14 ENCOUNTER — Ambulatory Visit (INDEPENDENT_AMBULATORY_CARE_PROVIDER_SITE_OTHER): Payer: Medicare Other | Admitting: Internal Medicine

## 2015-08-14 ENCOUNTER — Encounter: Payer: Self-pay | Admitting: Internal Medicine

## 2015-08-14 VITALS — BP 150/86 | HR 71 | Temp 98.2°F | Resp 20 | Ht 67.5 in | Wt 233.0 lb

## 2015-08-14 DIAGNOSIS — E1140 Type 2 diabetes mellitus with diabetic neuropathy, unspecified: Secondary | ICD-10-CM

## 2015-08-14 DIAGNOSIS — I252 Old myocardial infarction: Secondary | ICD-10-CM

## 2015-08-14 DIAGNOSIS — I10 Essential (primary) hypertension: Secondary | ICD-10-CM

## 2015-08-14 DIAGNOSIS — Z794 Long term (current) use of insulin: Secondary | ICD-10-CM

## 2015-08-14 DIAGNOSIS — E785 Hyperlipidemia, unspecified: Secondary | ICD-10-CM

## 2015-08-14 LAB — BASIC METABOLIC PANEL
BUN: 36 mg/dL — AB (ref 6–23)
CHLORIDE: 101 meq/L (ref 96–112)
CO2: 29 meq/L (ref 19–32)
CREATININE: 1.37 mg/dL — AB (ref 0.40–1.20)
Calcium: 10.1 mg/dL (ref 8.4–10.5)
GFR: 51.48 mL/min — ABNORMAL LOW (ref >60.00)
GLUCOSE: 204 mg/dL — AB (ref 70–99)
POTASSIUM: 4.1 meq/L (ref 3.5–5.1)
Sodium: 138 mEq/L (ref 135–145)

## 2015-08-14 MED ORDER — CHLORTHALIDONE 25 MG PO TABS: 12.5000 mg | ORAL_TABLET | Freq: Every day | ORAL | Status: DC

## 2015-08-14 NOTE — Progress Notes (Signed)
Subjective:    Patient ID: Lynn Hobbs, female    DOB: 12-05-1960, 55 y.o.   MRN: [id number]  HPI  Lab Results  Component Value Date   HGBA1C 8.4* 02/13/2015    BP Readings from Last 3 Encounters:  08/14/15 150/86  07/04/15 122/80  04/04/15 130/82    Wt Readings from Last 3 Encounters:  08/14/15 233 lb (105.688 kg)  07/04/15 233 lb (105.688 kg)  04/04/15 235 lb (106.30 kg)    55 year old patient who is seen for her six-month follow-up.  She is followed by endocrinology for diabetes.  She is also seen by cardiology at 6 month intervals but has not been evaluated since the fall.  Her cardiac status has been stable.  She has dyslipidemia and remains on high intensity statin therapy.  Weight is unchanged.  She generally feels well today. She is scheduled for an eye examination with Dr. Zigmund Daniel in August.  Past Medical History  Diagnosis Date  . BENIGN NEOPLASM OF SKIN SITE UNSPECIFIED 09/08/2008  . CEREBROVASCULAR DISEASE 12/11/2008  . DIABETES MELLITUS, TYPE II 10/12/2006  . HYPERLIPIDEMIA 10/12/2006  . HYPERTENSION 10/12/2006  . PERIPHERAL NEUROPATHY 10/21/2006  . Endometriosis   . Fibroids      Social History   Social History  . Marital Status: Single    Spouse Name: N/A  . Number of Children: N/A  . Years of Education: N/A   Occupational History  . Not on file.   Social History Main Topics  . Smoking status: Never Smoker   . Smokeless tobacco: Never Used  . Alcohol Use: No  . Drug Use: No  . Sexual Activity: Not on file   Other Topics Concern  . Not on file   Social History Narrative   Regular exercise: no   Caffeine use: ocassionally    Past Surgical History  Procedure Laterality Date  . Abdominal hysterectomy    . Left heart catheterization with coronary angiogram N/A 03/24/2013    Procedure: LEFT HEART CATHETERIZATION WITH CORONARY ANGIOGRAM;  Surgeon: Troy Sine, MD;  Location[facility] CATH LAB;  Service: Cardiovascular;  Laterality: N/A;    No  family history on file.  No Known Allergies  Current Outpatient Prescriptions on File Prior to Visit  Medication Sig Dispense Refill  . acetaminophen (TYLENOL) 650 MG CR tablet Take 1,300 mg by mouth daily as needed for pain.     Marland Kitchen amLODipine (NORVASC) 10 MG tablet TAKE ONE TABLET BY MOUTH ONCE DAILY 90 tablet 1  . aspirin EC 81 MG tablet Take 81 mg by mouth daily.    Marland Kitchen atorvastatin (LIPITOR) 80 MG tablet TAKE ONE TABLET BY MOUTH AT BEDTIME 30 tablet 9  . carvedilol (COREG) 25 MG tablet TAKE ONE & ONE-HALF TABLETS BY MOUTH TWICE DAILY 90 tablet 0  . chlorthalidone (HYGROTON) 25 MG tablet Take 0.5 tablets (12.5 mg total) by mouth daily. 90 tablet 1  . clopidogrel (PLAVIX) 75 MG tablet TAKE ONE TABLET BY MOUTH ONCE DAILY WITH BREAKFAST 30 tablet 9  . diphenoxylate-atropine (LOMOTIL) 2.5-0.025 MG tablet Take 1 tablet by mouth 4 (four) times daily as needed for diarrhea or loose stools. 30 tablet 0  . glucose blood (FREESTYLE TEST STRIPS) test strip Use to test blood sugar 3 times daily as instructed. Dx: E08.40 125 each 11  . insulin aspart (NOVOLOG FLEXPEN) 100 UNIT/ML FlexPen Inject under skin 22 units before brunch, 12 units before dinner 45 mL 2  . Insulin Detemir (LEVEMIR FLEXTOUCH) 100 UNIT/ML  Pen Inject 36 Units into the skin at bedtime. 45 mL 2  . KLOR-CON M20 20 MEQ tablet TAKE ONE TABLET BY MOUTH ONCE DAILY 90 tablet 3  . losartan (COZAAR) 100 MG tablet Take 1 tablet (100 mg total) by mouth daily. 90 tablet 3  . metFORMIN (GLUCOPHAGE) 500 MG tablet Take 1,000 mg by mouth 2 (two) times daily with a meal.    . nitroGLYCERIN (NITROSTAT) 0.4 MG SL tablet Place 1 tablet (0.4 mg total) under the tongue every 5 (five) minutes x 3 doses as needed for chest pain. 15 tablet 12  . RELION SHORT PEN NEEDLES 31G X 8 MM MISC USE 4 TIMES DAILY AS NEEDED 100 each 4  . Vitamin D, Ergocalciferol, (DRISDOL) 50000 units CAPS capsule Take 1 capsule (50,000 Units total) by mouth 2 (two) times a week. 24  capsule 0   No current facility-administered medications on file prior to visit.    BP 150/86 mmHg  Pulse 71  Temp(Src) 98.2 F (36.8 C) (Oral)  Resp 20  Ht 5' 7.5" (1.715 m)  Wt 233 lb (105.688 kg)  BMI 35.93 kg/m2  SpO2 97%      .  Review of Systems  Constitutional: Negative.   HENT: Negative for congestion, dental problem, hearing loss, rhinorrhea, sinus pressure, sore throat and tinnitus.   Eyes: Negative for pain, discharge and visual disturbance.  Respiratory: Negative for cough and shortness of breath.   Cardiovascular: Negative for chest pain, palpitations and leg swelling.  Gastrointestinal: Negative for nausea, vomiting, abdominal pain, diarrhea, constipation, blood in stool and abdominal distention.  Genitourinary: Negative for dysuria, urgency, frequency, hematuria, flank pain, vaginal bleeding, vaginal discharge, difficulty urinating, vaginal pain and pelvic pain.  Musculoskeletal: Positive for gait problem. Negative for joint swelling and arthralgias.  Skin: Negative for rash.  Neurological: Negative for dizziness, syncope, speech difficulty, weakness, numbness and headaches.  Hematological: Negative for adenopathy.  Psychiatric/Behavioral: Negative for behavioral problems, dysphoric mood and agitation. The patient is not nervous/anxious.        Objective:   Physical Exam  Constitutional: She is oriented to person, place, and time. She appears well-developed and well-nourished.  Repeat blood pressure 132/70  HENT:  Head: Normocephalic.  Right Ear: External ear normal.  Left Ear: External ear normal.  Mouth/Throat: Oropharynx is clear and moist.  Eyes: Conjunctivae and EOM are normal. Pupils are equal, round, and reactive to light.  Neck: Normal range of motion. Neck supple. No thyromegaly present.  Cardiovascular: Normal rate, regular rhythm, normal heart sounds and intact distal pulses.   Pulmonary/Chest: Effort normal and breath sounds normal.    Abdominal: Soft. Bowel sounds are normal. She exhibits no mass. There is no tenderness.  Musculoskeletal: Normal range of motion. She exhibits no edema.  Lymphadenopathy:    She has no cervical adenopathy.  Neurological: She is alert and oriented to person, place, and time.  Skin: Skin is warm and dry. No rash noted.  Psychiatric: She has a normal mood and affect. Her behavior is normal.          Assessment & Plan:  Hypertension.  Repeat blood pressure well controlled Diabetes.  Insulin regimen recently adjusted.  Follow-up endocrinology Coronary artery disease.  Will schedule follow-up with cardiology Obesity.  Weight loss encouraged  Follow-up 6 months or as needed   Nyoka Cowden, MD

## 2015-08-14 NOTE — Progress Notes (Signed)
Pre visit review using our clinic review tool, if applicable. No additional management support is needed unless otherwise documented below in the visit note. 

## 2015-08-14 NOTE — Patient Instructions (Signed)
Limit your sodium (Salt) intake  Please check your blood pressure on a regular basis.  If it is consistently greater than 150/90, please make an office appointment.   Please check your hemoglobin A1c every 3 months  

## 2015-08-17 ENCOUNTER — Other Ambulatory Visit: Payer: Self-pay | Admitting: Internal Medicine

## 2015-08-17 DIAGNOSIS — Z794 Long term (current) use of insulin: Principal | ICD-10-CM

## 2015-08-17 DIAGNOSIS — E1140 Type 2 diabetes mellitus with diabetic neuropathy, unspecified: Secondary | ICD-10-CM

## 2015-08-20 ENCOUNTER — Ambulatory Visit (INDEPENDENT_AMBULATORY_CARE_PROVIDER_SITE_OTHER): Payer: Medicare Other | Admitting: Cardiovascular Disease

## 2015-08-20 ENCOUNTER — Encounter: Payer: Self-pay | Admitting: Cardiovascular Disease

## 2015-08-20 VITALS — BP 126/80 | HR 77 | Ht 67.0 in | Wt 234.0 lb

## 2015-08-20 DIAGNOSIS — E785 Hyperlipidemia, unspecified: Secondary | ICD-10-CM

## 2015-08-20 DIAGNOSIS — I2510 Atherosclerotic heart disease of native coronary artery without angina pectoris: Secondary | ICD-10-CM | POA: Diagnosis not present

## 2015-08-20 DIAGNOSIS — E1140 Type 2 diabetes mellitus with diabetic neuropathy, unspecified: Secondary | ICD-10-CM

## 2015-08-20 DIAGNOSIS — I10 Essential (primary) hypertension: Secondary | ICD-10-CM

## 2015-08-20 DIAGNOSIS — E669 Obesity, unspecified: Secondary | ICD-10-CM

## 2015-08-20 DIAGNOSIS — N183 Chronic kidney disease, stage 3 (moderate): Secondary | ICD-10-CM

## 2015-08-20 DIAGNOSIS — Z794 Long term (current) use of insulin: Secondary | ICD-10-CM

## 2015-08-20 MED ORDER — CHLORTHALIDONE 25 MG PO TABS: 25.0000 mg | ORAL_TABLET | ORAL | Status: DC

## 2015-08-20 NOTE — Patient Instructions (Addendum)
Your physician recommends that you return for lab work in: 3 weeks.  Your physician has requested that you have a lexiscan myoview and Office Visit in February.  Your physician has recommended you make the following change in your medication: the chlorthalidone has been changed to 1 tablet every other day.

## 2015-08-22 ENCOUNTER — Encounter: Payer: Self-pay | Admitting: Cardiovascular Disease

## 2015-08-22 DIAGNOSIS — N189 Chronic kidney disease, unspecified: Secondary | ICD-10-CM | POA: Insufficient documentation

## 2015-08-22 NOTE — Progress Notes (Addendum)
Patient ID: Lynn Hobbs, female   DOB: 09/16/1960, 55 y.o.   MRN: [id number]     Primary M.D. : Dr. Bluford Kaufmann  HPI: Lynn Hobbs is a 55 y.o. female who presents to the office office for a 9 month cardiology evaluation.  Ms. Nofsinger has a history of diabetes mellitus with neurologic complications,a history of prior CVA, hyperlipidemia, and peripheral neuropathy. She developed chest pain leading to her hospitalization on 03/23/2013. ECG after arrival to the hospital in the early morning showed ST elevation anteriorly. Troponin was positive for 0.86 and urgent catheterization  performed by me demonstrated a 99% stenosis in the LAD  proximal to the diagonal vessel with initial TIMI 1/2 flow. She had a diffusely diseased distal LAD with 50-60% stenosis, and 40-50% narrowings in the first diagonal vessel. The circumflex vessel had mild irregularities. The RCA was diffusely diseased with 40% near ostial narrowing, diffuse 70-80% mid stenoses, 80% stenosis before the acute margin, and 60% distal stenosis.  She underwent insertion of a 3.0 x18 mm Xience Alpine stent postdilated 3.25 mm  in her mid LAD.  She was discharged on 03/27/2013. She was sent home with a life-vest.   During the hospital her LDL cholesterol was 184. Hemoglobin A1c was 8.1. A 2-D echo Doppler study pre-discharge following her initial event showed an ejection fraction of 30-35% and for this reason a life vest was initially recommended with plans for a followup echo in 3 months.  A followup echo Doppler study on June 14, 2013 showed improved LV function with ejection fraction increasing to 45-50%.  She did have left ventricular hypertrophy.  There was akinesis of the apical myocardium.  Her LifeVest was discontinued.  Followup blood work on May 25, 2013 showed markedly improved lipid status on her atorvastatin 80 mg with her total cholesterol improving from 259 to150 and your LDL cholesterol from 184 to 86.  HDL was 50,  triglycerides 68.  Since I last saw her  She denies any episodes of chest pain or shortness of breath. She has been on losartan 100 mg, chlorthalidone 12.5 mg daily,  Amlodipine 10 mgand carvedilol 37.5 mg twice a day  4.  Hypertension. She continues to be on dual antiplatelet therapy with aspirin and Plavix. She is on hyperal and she statin with atorvastatin 80 mg daily. She also is on vitamin D replacement therapy.  She presents for reevaluation.  Past Medical History  Diagnosis Date  . BENIGN NEOPLASM OF SKIN SITE UNSPECIFIED 09/08/2008  . CEREBROVASCULAR DISEASE 12/11/2008  . DIABETES MELLITUS, TYPE II 10/12/2006  . HYPERLIPIDEMIA 10/12/2006  . HYPERTENSION 10/12/2006  . PERIPHERAL NEUROPATHY 10/21/2006  . Endometriosis   . Fibroids     Past Surgical History  Procedure Laterality Date  . Abdominal hysterectomy    . Left heart catheterization with coronary angiogram N/A 03/24/2013    Procedure: LEFT HEART CATHETERIZATION WITH CORONARY ANGIOGRAM;  Surgeon: Troy Sine, MD;  Location: [facility] CATH LAB;  Service: Cardiovascular;  Laterality: N/A;    No Known Allergies  Current Outpatient Prescriptions  Medication Sig Dispense Refill  . acetaminophen (TYLENOL) 650 MG CR tablet Take 1,300 mg by mouth daily as needed for pain.     Marland Kitchen amLODipine (NORVASC) 10 MG tablet TAKE ONE TABLET BY MOUTH ONCE DAILY 90 tablet 1  . aspirin EC 81 MG tablet Take 81 mg by mouth daily.    Marland Kitchen atorvastatin (LIPITOR) 80 MG tablet TAKE ONE TABLET BY MOUTH AT BEDTIME 30 tablet 9  .  carvedilol (COREG) 25 MG tablet TAKE ONE & ONE-HALF TABLETS BY MOUTH TWICE DAILY 90 tablet 0  . chlorthalidone (HYGROTON) 25 MG tablet Take 1 tablet (25 mg total) by mouth every other day. 90 tablet 1  . clopidogrel (PLAVIX) 75 MG tablet TAKE ONE TABLET BY MOUTH ONCE DAILY WITH BREAKFAST 30 tablet 9  . diphenoxylate-atropine (LOMOTIL) 2.5-0.025 MG tablet Take 1 tablet by mouth 4 (four) times daily as needed for diarrhea or loose stools. 30 tablet  0  . glucose blood (FREESTYLE TEST STRIPS) test strip Use to test blood sugar 3 times daily as instructed. Dx: E08.40 125 each 11  . insulin aspart (NOVOLOG FLEXPEN) 100 UNIT/ML FlexPen Inject under skin 22 units before brunch, 12 units before dinner 45 mL 2  . Insulin Detemir (LEVEMIR FLEXTOUCH) 100 UNIT/ML Pen Inject 36 Units into the skin at bedtime. 45 mL 2  . KLOR-CON M20 20 MEQ tablet TAKE ONE TABLET BY MOUTH ONCE DAILY 90 tablet 3  . losartan (COZAAR) 100 MG tablet Take 1 tablet (100 mg total) by mouth daily. 90 tablet 3  . metFORMIN (GLUCOPHAGE) 500 MG tablet Take 1,000 mg by mouth 2 (two) times daily with a meal.    . nitroGLYCERIN (NITROSTAT) 0.4 MG SL tablet Place 1 tablet (0.4 mg total) under the tongue every 5 (five) minutes x 3 doses as needed for chest pain. 15 tablet 12  . RELION SHORT PEN NEEDLES 31G X 8 MM MISC USE 4 TIMES DAILY AS NEEDED 100 each 4  . Vitamin D, Ergocalciferol, (DRISDOL) 50000 units CAPS capsule Take 1 capsule (50,000 Units total) by mouth 2 (two) times a week. 24 capsule 0   No current facility-administered medications for this visit.    Social History   Social History  . Marital Status: Single    Spouse Name: N/A  . Number of Children: N/A  . Years of Education: N/A   Occupational History  . Not on file.   Social History Main Topics  . Smoking status: Never Smoker   . Smokeless tobacco: Never Used  . Alcohol Use: No  . Drug Use: No  . Sexual Activity: Not on file   Other Topics Concern  . Not on file   Social History Narrative   Regular exercise: no   Caffeine use: ocassionally    History reviewed. No pertinent family history.  ROS General: Negative; No fevers, chills, or night sweats;  HEENT: Negative; No changes in vision or hearing, sinus congestion, difficulty swallowing Pulmonary: Negative; No cough, wheezing, shortness of breath, hemoptysis Cardiovascular: See history of present illness; No chest pain, presyncope, syncope,  palpitations GI: Negative; No nausea, vomiting, diarrhea, or abdominal pain GU: Negative; No dysuria, hematuria, or difficulty voiding Musculoskeletal: Positive for leg spasms. Hematologic/Oncology: Negative; no easy bruising, bleeding Endocrine: Positive for diabetes; no heat/cold intolerance; Neuro: Negative; no changes in balance, headaches Skin: Negative; No rashes or skin lesions Psychiatric: Negative; No behavioral problems, depression Sleep: Negative; No snoring, daytime sleepiness, hypersomnolence, bruxism, restless legs, hypnogognic hallucinations, no cataplexy Other comprehensive 14 point system review is negative.  PE BP 126/80 mmHg  Pulse 77  Ht 5' 7" (1.702 m)  Wt 234 lb (106.142 kg)  BMI 36.64 kg/m2   Wt Readings from Last 3 Encounters:  08/20/15 234 lb (106.142 kg)  08/14/15 233 lb (105.688 kg)  07/04/15 233 lb (105.688 kg)   General: Alert, oriented, no distress.  Skin: normal turgor, no rashes HEENT: Normocephalic, atraumatic. Pupils round and reactive; sclera anicteric;no  lid lag. Extraocular muscles intact;; no xanthelasmas. Nose without nasal septal hypertrophy Mouth/Parynx benign; Mallinpatti scale 3 Neck: No JVD, no carotid bruits; normal carotid upstroke Lungs: clear to ausculatation and percussion; no wheezing or rales Chest wall: no tenderness to palpitation Heart: RRR, s1 s2 normal; 1/6 systolic murmur; no S3 or S4 gallop no diastolic murmur, rub thrills or heaves Abdomen: soft, nontender; no hepatosplenomehaly, BS+; abdominal aorta nontender and not dilated by palpation. Back: no CVA tenderness Pulses 2+ Extremities: no clubbing cyanosis or edema, Homan's sign negative  Neurologic: grossly nonfocal; cranial nerves grossly normal. Psychologic: normal affect and mood.  ECG (independently read by me):  Normal sinus rhythm at 77 bpm. Q waves in lead 3 and aVF.  Q waves V3 through V6 concordant with anterolateral infarct.  No ST segment changes.   Intervals normal.  ECG (independently read by me): Normal sinus rhythm at 73 bpm.  Evidence for prior anterolateral MI.  Left axis deviation.  Inferior Q waves in 3 and aVF.  February 2016 ECG (independently read by me): Normal sinus rhythm at 79 bpm.  Inferior Q waves in lead III and F.  Poor anterior R-wave progression.  August 2015 ECG (independently read by me): Sinus rhythm at 73 beats per minute.  Probably a progression anteriorly, concordant with her prior MI. QTc interval 449 ms  06/30/2013 ECG today (independently read by me): Sinus rhythm at 79 beats per minute.  Poor with progression anteriorly.  Prior ECG (independently read by me): Normal sinus rhythm at 86 beats per minute anterior Q waves with her MI. QTc interval 459 ms   LABS:   BMP Latest Ref Rng 08/14/2015 08/22/2014 05/03/2014  Glucose 70 - 99 mg/dL 204(H) 133(H) 117(H)  BUN 6 - 23 mg/dL 36(H) 27(H) 19  Creatinine 0.40 - 1.20 mg/dL 1.37(H) 1.36(H) 1.16(H)  Sodium 135 - 145 mEq/L 138 138 140  Potassium 3.5 - 5.1 mEq/L 4.1 3.9 4.7  Chloride 96 - 112 mEq/L 101 100 103  CO2 19 - 32 mEq/L _0 Calcium 8.4 - 10.5 mg/dL 10.1 9.6 9.4     Hepatic Function Latest Ref Rng 08/22/2014 05/03/2014 05/25/2013  Total Protein 6.0 - 8.3 g/dL 7.5 7.5 7.8  Albumin 3.5 - 5.2 g/dL 4.1 4.2 4.5  AST 0 - 37 U/L _1 ALT 0 - 35 U/L _2 Alk Phosphatase 39 - 117 U/L 50 43 57  Total Bilirubin 0.2 - 1.2 mg/dL 0.3 0.5 0.5     CBC Latest Ref Rng 07/04/2015 04/14/2014 05/25/2013  WBC 4.0 - 10.5 K/uL 7.5 - 7.2  Hemoglobin 12.0 - 15.0 g/dL 11.2(L) 13.3 11.9(L)  Hematocrit 36.0 - 46.0 % 33.8(L) 39.0 35.6(L)  Platelets 150.0 - 400.0 K/uL 200.0 - 222    No results found for: PROBNP  Lipid Panel     Component Value Date/Time   CHOL 147 08/22/2014 0912   TRIG 78 08/22/2014 0912   HDL 52 08/22/2014 0912   CHOLHDL 2.8 08/22/2014 0912   VLDL 16 08/22/2014 0912   LDLCALC 79 08/22/2014 0912     RADIOLOGY: No results  found.    ASSESSMENT AND PLAN: Ms. Cindy Fullman is a 55 year old African-American female who suffered an ST segment elevation myocardial infarction and presented urgently to the catheterization laboratory in the morning of 03/24/2013. She was found to have subtotal 99% LAD stenosis which was successfully stented with a DES stent.   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  This was subsequently discontinued.  She has continued to be without anginal symptomatology. Blood pressure today was stable on her current regimen consisting of amlodipine 10 mg, carvedilol 37.5 g twice a day, chlorthalidone 12.5 milligrams daily in addition to losartan 100 mg. She has chlorthalidone in a 25 mg pill and has been breaking this in half to take 12.5 mg but she states that the pill disintegrates and is not getting effective dosing.  There is no edema. I have suggested that she take the whole 25 mg pill and take this every other day rather than daily less swelling develops, or blood pressure increases.  I reviewed her recent blood work  From 2 weeks ago from her primary physician.  She has stage III chronic kidney disease and renal function remains stable from one year previously.  LFTs were normal. She is diabetic on insulin and metformin recent fasting glucose was elevated. Marland Kitchen  She was noted to be anemic on prior lab in April 2017.  In the fasting state, she will have a lipid panel, repeat CMET  With her medication adjustment, and CBC. I will see her in February 2018 for follow-up evaluation and prior to that office visit.  She will undergo a Lexiscan Myoview study, which will be 3 years following her acute coronary syndrome.  Time spent: 25 minutes  Troy Sine, MD, [facility]  08/22/2015 12:50 PM

## 2015-09-11 ENCOUNTER — Other Ambulatory Visit: Payer: Self-pay | Admitting: Internal Medicine

## 2015-09-11 ENCOUNTER — Other Ambulatory Visit: Payer: Self-pay | Admitting: Cardiovascular Disease

## 2015-09-12 DIAGNOSIS — E785 Hyperlipidemia, unspecified: Secondary | ICD-10-CM | POA: Diagnosis not present

## 2015-09-12 DIAGNOSIS — I2510 Atherosclerotic heart disease of native coronary artery without angina pectoris: Secondary | ICD-10-CM | POA: Diagnosis not present

## 2015-09-12 LAB — CBC
HEMATOCRIT: 34.9 % — AB (ref 35.0–45.0)
Hemoglobin: 10.9 g/dL — ABNORMAL LOW (ref 11.7–15.5)
MCH: 24 pg — ABNORMAL LOW (ref 27.0–33.0)
MCHC: 31.2 g/dL — AB (ref 32.0–36.0)
MCV: 76.9 fL — ABNORMAL LOW (ref 80.0–100.0)
PLATELETS: 228 10*3/uL (ref 140–400)
RBC: 4.54 MIL/uL (ref 3.80–5.10)
RDW: 18.7 % — AB (ref 11.0–15.0)
WBC: 7.6 10*3/uL (ref 3.8–10.8)

## 2015-09-13 ENCOUNTER — Other Ambulatory Visit: Payer: Self-pay | Admitting: Cardiovascular Disease

## 2015-09-13 LAB — LIPID PANEL
Cholesterol: 148 mg/dL (ref 125–200)
HDL: 58 mg/dL (ref >=46)
LDL CALC: 69 mg/dL (ref <130)
Total CHOL/HDL Ratio: 2.6 Ratio (ref <=5.0)
Triglycerides: 103 mg/dL (ref <150)
VLDL: 21 mg/dL (ref <30)

## 2015-09-13 LAB — COMPREHENSIVE METABOLIC PANEL
ALK PHOS: 52 U/L (ref 33–130)
ALT: 16 U/L (ref 6–29)
AST: 14 U/L (ref 10–35)
Albumin: 4.4 g/dL (ref 3.6–5.1)
BILIRUBIN TOTAL: 0.5 mg/dL (ref 0.2–1.2)
BUN: 29 mg/dL — AB (ref 7–25)
CO2: 25 mmol/L (ref 20–31)
CREATININE: 1.46 mg/dL — AB (ref 0.50–1.05)
Calcium: 9.7 mg/dL (ref 8.6–10.4)
Chloride: 100 mmol/L (ref 98–110)
GLUCOSE: 257 mg/dL — AB (ref 65–99)
Potassium: 4.5 mmol/L (ref 3.5–5.3)
Sodium: 141 mmol/L (ref 135–146)
Total Protein: 7.7 g/dL (ref 6.1–8.1)

## 2015-10-04 ENCOUNTER — Ambulatory Visit: Payer: Medicare Other | Admitting: Internal Medicine

## 2015-10-11 ENCOUNTER — Other Ambulatory Visit: Payer: Self-pay | Admitting: Cardiovascular Disease

## 2015-10-12 NOTE — Telephone Encounter (Signed)
Rx(s) sent to pharmacy electronically.  

## 2015-10-14 ENCOUNTER — Other Ambulatory Visit: Payer: Self-pay | Admitting: Internal Medicine

## 2015-10-14 ENCOUNTER — Other Ambulatory Visit: Payer: Self-pay | Admitting: Cardiovascular Disease

## 2015-10-15 NOTE — Telephone Encounter (Signed)
Rx(s) sent to pharmacy electronically.  

## 2015-10-25 ENCOUNTER — Encounter: Payer: Self-pay | Admitting: Cardiovascular Disease

## 2015-11-05 ENCOUNTER — Ambulatory Visit (INDEPENDENT_AMBULATORY_CARE_PROVIDER_SITE_OTHER): Payer: Medicare Other | Admitting: Ophthalmology

## 2015-11-05 DIAGNOSIS — H43813 Vitreous degeneration, bilateral: Secondary | ICD-10-CM

## 2015-11-05 DIAGNOSIS — E113593 Type 2 diabetes mellitus with proliferative diabetic retinopathy without macular edema, bilateral: Secondary | ICD-10-CM | POA: Diagnosis not present

## 2015-11-05 DIAGNOSIS — E11319 Type 2 diabetes mellitus with unspecified diabetic retinopathy without macular edema: Secondary | ICD-10-CM

## 2015-11-05 DIAGNOSIS — I10 Essential (primary) hypertension: Secondary | ICD-10-CM | POA: Diagnosis not present

## 2015-11-05 DIAGNOSIS — H35033 Hypertensive retinopathy, bilateral: Secondary | ICD-10-CM

## 2015-11-05 DIAGNOSIS — H2513 Age-related nuclear cataract, bilateral: Secondary | ICD-10-CM | POA: Diagnosis not present

## 2015-11-18 ENCOUNTER — Other Ambulatory Visit: Payer: Self-pay | Admitting: Cardiovascular Disease

## 2015-11-19 NOTE — Telephone Encounter (Signed)
REFILL 

## 2015-12-02 ENCOUNTER — Other Ambulatory Visit: Payer: Self-pay | Admitting: Internal Medicine

## 2015-12-16 ENCOUNTER — Other Ambulatory Visit: Payer: Self-pay | Admitting: Internal Medicine

## 2016-01-10 ENCOUNTER — Other Ambulatory Visit: Payer: Self-pay | Admitting: *Deleted

## 2016-01-10 ENCOUNTER — Telehealth: Payer: Self-pay | Admitting: *Deleted

## 2016-01-10 DIAGNOSIS — I10 Essential (primary) hypertension: Secondary | ICD-10-CM

## 2016-01-10 DIAGNOSIS — E1140 Type 2 diabetes mellitus with diabetic neuropathy, unspecified: Secondary | ICD-10-CM

## 2016-01-10 DIAGNOSIS — Z794 Long term (current) use of insulin: Secondary | ICD-10-CM

## 2016-01-10 NOTE — Telephone Encounter (Signed)
Informed patient time to have labs checked. Orders will be placed into the computer. Patient voiced understanding. Not toy eat or drink prior to appointment.

## 2016-01-18 DIAGNOSIS — E1140 Type 2 diabetes mellitus with diabetic neuropathy, unspecified: Secondary | ICD-10-CM | POA: Diagnosis not present

## 2016-01-18 DIAGNOSIS — Z794 Long term (current) use of insulin: Secondary | ICD-10-CM | POA: Diagnosis not present

## 2016-01-18 DIAGNOSIS — I10 Essential (primary) hypertension: Secondary | ICD-10-CM | POA: Diagnosis not present

## 2016-01-18 LAB — CBC
HEMATOCRIT: 35.4 % (ref 35.0–45.0)
HEMOGLOBIN: 11.2 g/dL — AB (ref 11.7–15.5)
MCH: 24.8 pg — AB (ref 27.0–33.0)
MCHC: 31.6 g/dL — AB (ref 32.0–36.0)
MCV: 78.3 fL — ABNORMAL LOW (ref 80.0–100.0)
Platelets: 227 10*3/uL (ref 140–400)
RBC: 4.52 MIL/uL (ref 3.80–5.10)
RDW: 17.8 % — ABNORMAL HIGH (ref 11.0–15.0)
WBC: 6.9 10*3/uL (ref 3.8–10.8)

## 2016-01-19 LAB — COMPREHENSIVE METABOLIC PANEL
ALBUMIN: 4.3 g/dL (ref 3.6–5.1)
ALK PHOS: 52 U/L (ref 33–130)
ALT: 12 U/L (ref 6–29)
AST: 16 U/L (ref 10–35)
BILIRUBIN TOTAL: 0.5 mg/dL (ref 0.2–1.2)
BUN: 24 mg/dL (ref 7–25)
CALCIUM: 9.8 mg/dL (ref 8.6–10.4)
CO2: 27 mmol/L (ref 20–31)
Chloride: 101 mmol/L (ref 98–110)
Creat: 1.13 mg/dL — ABNORMAL HIGH (ref 0.50–1.05)
GLUCOSE: 219 mg/dL — AB (ref 65–99)
POTASSIUM: 4.6 mmol/L (ref 3.5–5.3)
Sodium: 139 mmol/L (ref 135–146)
TOTAL PROTEIN: 8 g/dL (ref 6.1–8.1)

## 2016-01-19 LAB — HEMOGLOBIN A1C
Hgb A1c MFr Bld: 8.3 % — ABNORMAL HIGH (ref <5.7)
Mean Plasma Glucose: 192 mg/dL

## 2016-01-23 ENCOUNTER — Other Ambulatory Visit: Payer: Self-pay | Admitting: Internal Medicine

## 2016-02-01 ENCOUNTER — Other Ambulatory Visit: Payer: Self-pay | Admitting: Cardiovascular Disease

## 2016-02-04 NOTE — Telephone Encounter (Signed)
Rx request sent to pharmacy.  

## 2016-02-13 ENCOUNTER — Ambulatory Visit (INDEPENDENT_AMBULATORY_CARE_PROVIDER_SITE_OTHER): Payer: Medicare Other | Admitting: Internal Medicine

## 2016-02-13 ENCOUNTER — Encounter: Payer: Self-pay | Admitting: Internal Medicine

## 2016-02-13 VITALS — BP 120/72 | HR 58 | Temp 98.1°F | Ht 67.0 in | Wt 225.2 lb

## 2016-02-13 DIAGNOSIS — I10 Essential (primary) hypertension: Secondary | ICD-10-CM

## 2016-02-13 DIAGNOSIS — N183 Chronic kidney disease, stage 3 unspecified: Secondary | ICD-10-CM

## 2016-02-13 DIAGNOSIS — E1140 Type 2 diabetes mellitus with diabetic neuropathy, unspecified: Secondary | ICD-10-CM

## 2016-02-13 DIAGNOSIS — I679 Cerebrovascular disease, unspecified: Secondary | ICD-10-CM

## 2016-02-13 DIAGNOSIS — I2510 Atherosclerotic heart disease of native coronary artery without angina pectoris: Secondary | ICD-10-CM

## 2016-02-13 DIAGNOSIS — Z794 Long term (current) use of insulin: Secondary | ICD-10-CM

## 2016-02-13 DIAGNOSIS — I252 Old myocardial infarction: Secondary | ICD-10-CM | POA: Diagnosis not present

## 2016-02-13 DIAGNOSIS — E785 Hyperlipidemia, unspecified: Secondary | ICD-10-CM

## 2016-02-13 MED ORDER — INSULIN ASPART 100 UNIT/ML FLEXPEN: PEN_INJECTOR | SUBCUTANEOUS | 2 refills | Status: DC

## 2016-02-13 NOTE — Progress Notes (Signed)
Pre visit review using our clinic review tool, if applicable. No additional management support is needed unless otherwise documented below in the visit note. 

## 2016-02-13 NOTE — Patient Instructions (Signed)
Please follow-up with your endocrinologist within the next 2 months  Limit your sodium (Salt) intake  Please see your eye doctor yearly to check for diabetic eye damage  Return in 6 months for follow-up

## 2016-02-13 NOTE — Progress Notes (Signed)
Subjective:    Patient ID: Lynn Hobbs, female    DOB: 10/02/1960, 55 y.o.   MRN: [id number]  HPI Lab Results  Component Value Date   HGBA1C 8.3 (H) 01/18/2016    Wt Readings from Last 3 Encounters:  02/13/16 225 lb 4 oz (102.2 kg)  08/20/15 234 lb (106.1 kg)  08/14/15 233 lb (46.32 kg)   55 year old patient who is seen today for her six-month follow-up.  She has not been followed by endocrinology recently. She remains on basal insulin and states fasting blood sugars generally run between 109 and 115.  Her lowest fasting blood sugar 80, and she states her highest recently has been 1:30 She also states her blood sugars are reasonably well-controlled throughout the day with a high of 145. She is on NovoLog 22 units prior to brunch and 12 units prior to her evening meal, which is her largest meal.  There is been no hypoglycemia She has cerebrovascular disease and requires disability form completion.  Today she has essential hypertension and coronary artery disease.  Cardiac status has been stable.  Past Medical History:  Diagnosis Date  . BENIGN NEOPLASM OF SKIN SITE UNSPECIFIED 09/08/2008  . CEREBROVASCULAR DISEASE 12/11/2008  . DIABETES MELLITUS, TYPE II 10/12/2006  . Endometriosis   . Fibroids   . HYPERLIPIDEMIA 10/12/2006  . HYPERTENSION 10/12/2006  . PERIPHERAL NEUROPATHY 10/21/2006     Social History   Social History  . Marital status: Single    Spouse name: N/A  . Number of children: N/A  . Years of education: N/A   Occupational History  . Not on file.   Social History Main Topics  . Smoking status: Never Smoker  . Smokeless tobacco: Never Used  . Alcohol use No  . Drug use: No  . Sexual activity: Not on file   Other Topics Concern  . Not on file   Social History Narrative   Regular exercise: no   Caffeine use: ocassionally    Past Surgical History:  Procedure Laterality Date  . ABDOMINAL HYSTERECTOMY    . LEFT HEART CATHETERIZATION WITH CORONARY  ANGIOGRAM N/A 03/24/2013   Procedure: LEFT HEART CATHETERIZATION WITH CORONARY ANGIOGRAM;  Surgeon: Troy Sine, MD;  Location: [facility] CATH LAB;  Service: Cardiovascular;  Laterality: N/A;    No family history on file.  No Known Allergies  Current Outpatient Prescriptions on File Prior to Visit  Medication Sig Dispense Refill  . acetaminophen (TYLENOL) 650 MG CR tablet Take 1,300 mg by mouth daily as needed for pain.     Marland Kitchen amLODipine (NORVASC) 10 MG tablet TAKE ONE TABLET BY MOUTH ONCE DAILY 90 tablet 1  . aspirin EC 81 MG tablet Take 81 mg by mouth daily.    Marland Kitchen atorvastatin (LIPITOR) 80 MG tablet TAKE ONE TABLET BY MOUTH AT BEDTIME 30 tablet 9  . carvedilol (COREG) 25 MG tablet Take 1.5 tablets (37.5 mg total) by mouth 2 (two) times daily. Please schedule appointment for refills. 90 tablet 0  . chlorthalidone (HYGROTON) 25 MG tablet Take 1 tablet (25 mg total) by mouth every other day. 90 tablet 1  . clopidogrel (PLAVIX) 75 MG tablet Take 1 tablet (75 mg total) by mouth daily. With breakfast. Please schedule a follow-up appointment. 30 tablet 3  . diphenoxylate-atropine (LOMOTIL) 2.5-0.025 MG tablet Take 1 tablet by mouth 4 (four) times daily as needed for diarrhea or loose stools. 30 tablet 0  . glucose blood (FREESTYLE TEST STRIPS) test strip Use to test  blood sugar 3 times daily as instructed. Dx: E08.40 125 each 11  . Insulin Detemir (LEVEMIR FLEXTOUCH) 100 UNIT/ML Pen Inject 36 Units into the skin at bedtime. 45 mL 2  . KLOR-CON M20 20 MEQ tablet TAKE ONE TABLET BY MOUTH ONCE DAILY 90 tablet 3  . losartan (COZAAR) 100 MG tablet TAKE ONE TABLET BY MOUTH DAILY 90 tablet 1  . metFORMIN (GLUCOPHAGE) 500 MG tablet Take 1,000 mg by mouth 2 (two) times daily with a meal.    . NITROSTAT 0.4 MG SL tablet PLACE ONE TABLET UNDER THE TONGUE EVERY 5 MINUTES FOR 3 DOSES AS NEEDED FOR CHEST PAIN 20 tablet 2  . RELION SHORT PEN NEEDLES 31G X 8 MM MISC USE 4 TIMES DAILY AS NEEDED 100 each 4  . Vitamin D,  Ergocalciferol, (DRISDOL) 50000 units CAPS capsule Take 1 capsule (50,000 Units total) by mouth 2 (two) times a week. 24 capsule 0   No current facility-administered medications on file prior to visit.     BP 120/72 (BP Location: Left Arm, Patient Position: Sitting, Cuff Size: Large)   Pulse (!) 58   Temp 98.1 F (36.7 C) (Oral)   Ht 5\' 7"  (1.702 m)   Wt 225 lb 4 oz (102.2 kg)   SpO2 98%   BMI 35.28 kg/m     Review of Systems  Constitutional: Positive for fatigue.  HENT: Negative for congestion, dental problem, hearing loss, rhinorrhea, sinus pressure, sore throat and tinnitus.   Eyes: Negative for pain, discharge and visual disturbance.  Respiratory: Negative for cough and shortness of breath.   Cardiovascular: Negative for chest pain, palpitations and leg swelling.  Gastrointestinal: Negative for abdominal distention, abdominal pain, blood in stool, constipation, diarrhea, nausea and vomiting.  Genitourinary: Negative for difficulty urinating, dysuria, flank pain, frequency, hematuria, pelvic pain, urgency, vaginal bleeding, vaginal discharge and vaginal pain.  Musculoskeletal: Negative for arthralgias, gait problem and joint swelling.  Skin: Negative for rash.  Neurological: Positive for weakness and numbness. Negative for dizziness, syncope, speech difficulty and headaches.  Hematological: Negative for adenopathy.  Psychiatric/Behavioral: Negative for agitation, behavioral problems and dysphoric mood. The patient is not nervous/anxious.        Objective:   Physical Exam  Constitutional: She is oriented to person, place, and time. She appears well-developed and well-nourished.  HENT:  Head: Normocephalic.  Right Ear: External ear normal.  Left Ear: External ear normal.  Mouth/Throat: Oropharynx is clear and moist.  Eyes: Conjunctivae and EOM are normal. Pupils are equal, round, and reactive to light.  Neck: Normal range of motion. Neck supple. No thyromegaly present.    Cardiovascular: Normal rate, regular rhythm, normal heart sounds and intact distal pulses.   Pulmonary/Chest: Effort normal and breath sounds normal.  Abdominal: Soft. Bowel sounds are normal. She exhibits no mass. There is no tenderness.  Musculoskeletal: Normal range of motion.  Lymphadenopathy:    She has no cervical adenopathy.  Neurological: She is alert and oriented to person, place, and time.  Left arm paresis Decreased vibratory sensation and monofilament testing left foot  Skin: Skin is warm and dry. No rash noted.  Psychiatric: She has a normal mood and affect. Her behavior is normal.          Assessment & Plan:   Diabetes mellitus.  Will increase mealtime insulin 22 units prior to brunch and her evening meal.  Patient was encouraged to follow for endocrinology within the next 2 months Hypertension, stable Coronary artery disease, stable Chronic kidney  disease.  Renal indices improved Cerebrovascular disease.  Disability forms completed  Follow-up 6 months  Syriana Croslin Pilar Plate

## 2016-03-13 ENCOUNTER — Other Ambulatory Visit: Payer: Self-pay | Admitting: Cardiovascular Disease

## 2016-03-13 ENCOUNTER — Other Ambulatory Visit: Payer: Self-pay | Admitting: Internal Medicine

## 2016-03-14 ENCOUNTER — Telehealth: Payer: Self-pay | Admitting: Cardiovascular Disease

## 2016-03-14 MED ORDER — CARVEDILOL 25 MG PO TABS: ORAL_TABLET | ORAL | 3 refills | Status: DC

## 2016-03-14 MED ORDER — ATORVASTATIN CALCIUM 80 MG PO TABS: 80.0000 mg | ORAL_TABLET | Freq: Every day | ORAL | 3 refills | Status: DC

## 2016-03-14 NOTE — Telephone Encounter (Signed)
Returned call to patient-patient states she needs rx refills for Lipitor and Coreg.  Also needs f/u appointment with Dr. Claiborne Billings after her [facility].    Rx sent to pharmacy and f/u appt made for 3/5 at 10:30 with Dr. Claiborne Billings at [facility] location.    Pt verbalized understanding.  Advised to call with further questions or concerns.

## 2016-03-14 NOTE — Telephone Encounter (Signed)
New Message    Pt was advised to have Myocardial perfusion test done , pt is calling now to schedule test we will contact [facility] for test.  Patient is out of medication and refills were to be based on her followup appointment after the test.

## 2016-04-17 ENCOUNTER — Telehealth (HOSPITAL_COMMUNITY): Payer: Self-pay

## 2016-04-17 NOTE — Telephone Encounter (Signed)
Encounter complete. 

## 2016-04-22 ENCOUNTER — Ambulatory Visit (HOSPITAL_COMMUNITY)
Admission: RE | Admit: 2016-04-22 | Discharge: 2016-04-22 | Disposition: A | Discharge status: Home / Self Care | Payer: Medicare Other | Source: Ambulatory Visit | Attending: Cardiovascular Disease | Admitting: Cardiovascular Disease

## 2016-04-22 DIAGNOSIS — I2510 Atherosclerotic heart disease of native coronary artery without angina pectoris: Secondary | ICD-10-CM

## 2016-04-22 LAB — MYOCARDIAL PERFUSION IMAGING
CHL CUP NUCLEAR SRS: 12
CHL CUP NUCLEAR SSS: 35
LVDIAVOL: 104 mL (ref 46–106)
LVSYSVOL: 47 mL
Peak HR: 100 {beats}/min
Rest HR: 78 {beats}/min
SDS: 23
TID: 1

## 2016-04-22 MED ORDER — REGADENOSON 0.4 MG/5ML IV SOLN
0.4000 mg | Freq: Once | INTRAVENOUS | Status: AC
  Administered 2016-04-22: 0.4 mg via INTRAVENOUS

## 2016-04-22 MED ORDER — TECHNETIUM TC 99M TETROFOSMIN IV KIT
10.2000 | PACK | Freq: Once | INTRAVENOUS | Status: AC | PRN
  Administered 2016-04-22: 10.2 via INTRAVENOUS
  Filled 2016-04-22: qty 11

## 2016-04-22 MED ORDER — TECHNETIUM TC 99M TETROFOSMIN IV KIT
29.1000 | PACK | Freq: Once | INTRAVENOUS | Status: AC | PRN
  Administered 2016-04-22: 29.1 via INTRAVENOUS
  Filled 2016-04-22: qty 30

## 2016-05-07 ENCOUNTER — Ambulatory Visit (INDEPENDENT_AMBULATORY_CARE_PROVIDER_SITE_OTHER): Payer: Medicare Other | Admitting: Ophthalmology

## 2016-05-07 DIAGNOSIS — H43813 Vitreous degeneration, bilateral: Secondary | ICD-10-CM | POA: Diagnosis not present

## 2016-05-07 DIAGNOSIS — H35033 Hypertensive retinopathy, bilateral: Secondary | ICD-10-CM | POA: Diagnosis not present

## 2016-05-07 DIAGNOSIS — I10 Essential (primary) hypertension: Secondary | ICD-10-CM | POA: Diagnosis not present

## 2016-05-07 DIAGNOSIS — E11319 Type 2 diabetes mellitus with unspecified diabetic retinopathy without macular edema: Secondary | ICD-10-CM | POA: Diagnosis not present

## 2016-05-07 DIAGNOSIS — E113593 Type 2 diabetes mellitus with proliferative diabetic retinopathy without macular edema, bilateral: Secondary | ICD-10-CM

## 2016-05-07 DIAGNOSIS — H2513 Age-related nuclear cataract, bilateral: Secondary | ICD-10-CM | POA: Diagnosis not present

## 2016-05-07 LAB — HM DIABETES EYE EXAM

## 2016-05-12 ENCOUNTER — Encounter: Payer: Self-pay | Admitting: Cardiovascular Disease

## 2016-05-12 ENCOUNTER — Ambulatory Visit (INDEPENDENT_AMBULATORY_CARE_PROVIDER_SITE_OTHER): Payer: Medicare Other | Admitting: Cardiovascular Disease

## 2016-05-12 VITALS — BP 138/84 | HR 68 | Ht 67.0 in | Wt 226.0 lb

## 2016-05-12 DIAGNOSIS — I252 Old myocardial infarction: Secondary | ICD-10-CM

## 2016-05-12 DIAGNOSIS — I10 Essential (primary) hypertension: Secondary | ICD-10-CM | POA: Diagnosis not present

## 2016-05-12 DIAGNOSIS — I2510 Atherosclerotic heart disease of native coronary artery without angina pectoris: Secondary | ICD-10-CM

## 2016-05-12 DIAGNOSIS — N183 Chronic kidney disease, stage 3 unspecified: Secondary | ICD-10-CM

## 2016-05-12 DIAGNOSIS — E1140 Type 2 diabetes mellitus with diabetic neuropathy, unspecified: Secondary | ICD-10-CM

## 2016-05-12 DIAGNOSIS — E785 Hyperlipidemia, unspecified: Secondary | ICD-10-CM

## 2016-05-12 DIAGNOSIS — E668 Other obesity: Secondary | ICD-10-CM | POA: Diagnosis not present

## 2016-05-12 DIAGNOSIS — Z794 Long term (current) use of insulin: Secondary | ICD-10-CM | POA: Diagnosis not present

## 2016-05-12 MED ORDER — CLOPIDOGREL BISULFATE 75 MG PO TABS: 75.0000 mg | ORAL_TABLET | Freq: Every day | ORAL | 3 refills | Status: DC

## 2016-05-12 NOTE — Progress Notes (Signed)
Patient ID: Lynn Hobbs, female   DOB: 03/14/1960, 56 y.o.   MRN: [id number]     Primary M.D. : Dr. Bluford Kaufmann  HPI: Lynn Hobbs is a 56 y.o. female who presents to the office office for a 9 month cardiology evaluation.  Lynn Hobbs has a history of diabetes mellitus with neurologic complications,a history of prior CVA, hyperlipidemia, and peripheral neuropathy. She developed chest pain leading to her hospitalization on 03/23/2013. ECG after arrival to the hospital in the early morning showed ST elevation anteriorly. Troponin was positive for 0.86 and urgent catheterization  performed by me demonstrated a 99% stenosis in the LAD  proximal to the diagonal vessel with initial TIMI 1/2 flow. She had a diffusely diseased distal LAD with 50-60% stenosis, and 40-50% narrowings in the first diagonal vessel. The circumflex vessel had mild irregularities. The RCA was diffusely diseased with 40% near ostial narrowing, diffuse 70-80% mid stenoses, 80% stenosis before the acute margin, and 60% distal stenosis.  She underwent insertion of a 3.0 x18 mm Xience Alpine stent postdilated 3.25 mm  in her mid LAD.  She was discharged on 03/27/2013. She was sent home with a life-vest.   During the hospital her LDL cholesterol was 184. Hemoglobin A1c was 8.1. A 2-D echo Doppler study pre-discharge following her initial event showed an ejection fraction of 30-35% and for this reason a life vest was initially recommended with plans for a followup echo in 3 months.  A followup echo Doppler study on June 14, 2013 showed improved LV function with ejection fraction increasing to 45-50%.  She did have left ventricular hypertrophy.  There was akinesis of the apical myocardium.  Her LifeVest was discontinued.  Followup blood work on May 25, 2013 showed markedly improved lipid status on her atorvastatin 80 mg with her total cholesterol improving from 259 to150 and your LDL cholesterol from 184 to 86.  HDL was 50,  triglycerides 68.  Since I last saw her  She denies any episodes of chest pain or shortness of breath. She has been on losartan 100 mg, chlorthalidone 25 mg daily,  amlodipine 10 mg and carvedilol 37.5 mg twice a day for hypertension. She continues to be on dual antiplatelet therapy with aspirin and Plavix and is unaware of any bleeding.  She is on high potency statin with atorvastatin 80 mg daily. She also is on vitamin D replacement therapy.  She underwent a nuclear perfusion study 04/22/2016.  This showed an ejection fraction of 55%.  Was a eyes defect that was nonreversible and consistent with prior infarct in the anteroseptal mid inferior, apical anterior, apical septal, apical inferior and apical lateral wall.  There was no ischemia.  She remains asymptomatic without chest pain or shortness of breath.  She presents for reevaluation.  Past Medical History:  Diagnosis Date  . BENIGN NEOPLASM OF SKIN SITE UNSPECIFIED 09/08/2008  . CEREBROVASCULAR DISEASE 12/11/2008  . DIABETES MELLITUS, TYPE II 10/12/2006  . Endometriosis   . Fibroids   . HYPERLIPIDEMIA 10/12/2006  . HYPERTENSION 10/12/2006  . PERIPHERAL NEUROPATHY 10/21/2006    Past Surgical History:  Procedure Laterality Date  . ABDOMINAL HYSTERECTOMY    . LEFT HEART CATHETERIZATION WITH CORONARY ANGIOGRAM N/A 03/24/2013   Procedure: LEFT HEART CATHETERIZATION WITH CORONARY ANGIOGRAM;  Surgeon: Troy Sine, MD;  Location: [facility] CATH LAB;  Service: Cardiovascular;  Laterality: N/A;    Allergies  Allergen Reactions  . Tape Rash    Current Outpatient Prescriptions  Medication Sig Dispense  Refill  . acetaminophen (TYLENOL) 650 MG CR tablet Take 1,300 mg by mouth daily as needed for pain.     Marland Kitchen amLODipine (NORVASC) 10 MG tablet TAKE ONE TABLET BY MOUTH ONCE DAILY 90 tablet 1  . aspirin EC 81 MG tablet Take 81 mg by mouth daily.    Marland Kitchen atorvastatin (LIPITOR) 80 MG tablet Take 1 tablet (80 mg total) by mouth at bedtime. 90 tablet 3  . carvedilol  (COREG) 25 MG tablet TAKE ONE & ONE-HALF TABLETS BY MOUTH TWICE DAILY. 135 tablet 3  . chlorthalidone (HYGROTON) 25 MG tablet Take 1 tablet (25 mg total) by mouth every other day. 90 tablet 1  . Cholecalciferol (VITAMIN D-3) 5000 units TABS Take 1 tablet by mouth daily.    . clopidogrel (PLAVIX) 75 MG tablet Take 1 tablet (75 mg total) by mouth daily. 90 tablet 3  . diphenoxylate-atropine (LOMOTIL) 2.5-0.025 MG tablet Take 1 tablet by mouth 4 (four) times daily as needed for diarrhea or loose stools. 30 tablet 0  . glucose blood (FREESTYLE TEST STRIPS) test strip Use to test blood sugar 3 times daily as instructed. Dx: E08.40 125 each 11  . insulin aspart (NOVOLOG FLEXPEN) 100 UNIT/ML FlexPen Inject under skin 22 units before brunch, 22 units before dinner 45 mL 2  . Insulin Detemir (LEVEMIR FLEXTOUCH) 100 UNIT/ML Pen Inject 36 Units into the skin at bedtime. 45 mL 2  . KLOR-CON M20 20 MEQ tablet TAKE ONE TABLET BY MOUTH ONCE DAILY 90 tablet 3  . losartan (COZAAR) 100 MG tablet TAKE ONE TABLET BY MOUTH  DAILY 90 tablet 1  . metFORMIN (GLUCOPHAGE) 500 MG tablet Take 1,000 mg by mouth 2 (two) times daily with a meal.    . NITROSTAT 0.4 MG SL tablet PLACE ONE TABLET UNDER THE TONGUE EVERY 5 MINUTES FOR 3 DOSES AS NEEDED FOR CHEST PAIN 20 tablet 2  . RELION SHORT PEN NEEDLES 31G X 8 MM MISC USE 4 TIMES DAILY AS NEEDED 100 each 4   No current facility-administered medications for this visit.     Social History   Social History  . Marital status: Single    Spouse name: N/A  . Number of children: N/A  . Years of education: N/A   Occupational History  . Not on file.   Social History Main Topics  . Smoking status: Never Smoker  . Smokeless tobacco: Never Used  . Alcohol use No  . Drug use: No  . Sexual activity: Not on file   Other Topics Concern  . Not on file   Social History Narrative   Regular exercise: no   Caffeine use: ocassionally    No family history on  file.  ROS General: Negative; No fevers, chills, or night sweats;  HEENT: Negative; No changes in vision or hearing, sinus congestion, difficulty swallowing Pulmonary: Negative; No cough, wheezing, shortness of breath, hemoptysis Cardiovascular: See history of present illness; No chest pain, presyncope, syncope, palpitations GI: Negative; No nausea, vomiting, diarrhea, or abdominal pain GU: Negative; No dysuria, hematuria, or difficulty voiding Musculoskeletal: Positive for leg spasms. Hematologic/Oncology: Negative; no easy bruising, bleeding Endocrine: Positive for diabetes; no heat/cold intolerance; Neuro: Negative; no changes in balance, headaches Skin: Negative; No rashes or skin lesions Psychiatric: Negative; No behavioral problems, depression Sleep: Negative; No snoring, daytime sleepiness, hypersomnolence, bruxism, restless legs, hypnogognic hallucinations, no cataplexy Other comprehensive 14 point system review is negative.  PE BP 138/84   Pulse 68   Ht '5\' 7"'  (1.702  m)   Wt 226 lb (102.5 kg)   BMI 35.40 kg/m    Wt Readings from Last 3 Encounters:  05/12/16 226 lb (102.5 kg)  04/22/16 225 lb (102.1 kg)  02/13/16 225 lb 4 oz (102.2 kg)   General: Alert, oriented, no distress.  Skin: normal turgor, no rashes HEENT: Normocephalic, atraumatic. Pupils round and reactive; sclera anicteric;no lid lag. Extraocular muscles intact;; no xanthelasmas. Nose without nasal septal hypertrophy Mouth/Parynx benign; Mallinpatti scale 3 Neck: No JVD, no carotid bruits; normal carotid upstroke Lungs: clear to ausculatation and percussion; no wheezing or rales Chest wall: no tenderness to palpitation Heart: RRR, s1 s2 normal; 1/6 systolic murmur; no S3 or S4 gallop no diastolic murmur, rub thrills or heaves Abdomen: soft, nontender; no hepatosplenomehaly, BS+; abdominal aorta nontender and not dilated by palpation. Back: no CVA tenderness Pulses 2+ Extremities: no clubbing cyanosis or  edema, Homan's sign negative  Neurologic: grossly nonfocal; cranial nerves grossly normal. Psychologic: normal affect and mood.  ECG (independently read by me): Not done today since the patient had had a recent Myoview study.  June 2017 ECG (independently read by me):  Normal sinus rhythm at 77 bpm. Q waves in lead 3 and aVF.  Q waves V3 through V6 concordant with anterolateral infarct.  No ST segment changes.  Intervals normal.  ECG (independently read by me): Normal sinus rhythm at 73 bpm.  Evidence for prior anterolateral MI.  Left axis deviation.  Inferior Q waves in 3 and aVF.  February 2016 ECG (independently read by me): Normal sinus rhythm at 79 bpm.  Inferior Q waves in lead III and F.  Poor anterior R-wave progression.  August 2015 ECG (independently read by me): Sinus rhythm at 73 beats per minute.  Probably a progression anteriorly, concordant with her prior MI. QTc interval 449 ms  06/30/2013 ECG today (independently read by me): Sinus rhythm at 79 beats per minute.  Poor with progression anteriorly.  Prior ECG (independently read by me): Normal sinus rhythm at 86 beats per minute anterior Q waves with her MI. QTc interval 459 ms   LABS:   BMP Latest Ref Rng & Units 01/18/2016 09/12/2015 08/14/2015  Glucose 65 - 99 mg/dL 219(H) 257(H) 204(H)  BUN 7 - 25 mg/dL 24 29(H) 36(H)  Creatinine 0.50 - 1.05 mg/dL 1.13(H) 1.46(H) 1.37(H)  Sodium 135 - 146 mmol/L 139 141 138  Potassium 3.5 - 5.3 mmol/L 4.6 4.5 4.1  Chloride 98 - 110 mmol/L 101 100 101  CO2 20 - 31 mmol/L '27 25 29  ' Calcium 8.6 - 10.4 mg/dL 9.8 9.7 10.1     Hepatic Function Latest Ref Rng & Units 01/18/2016 09/12/2015 08/22/2014  Total Protein 6.1 - 8.1 g/dL 8.0 7.7 7.5  Albumin 3.6 - 5.1 g/dL 4.3 4.4 4.1  AST 10 - 35 U/L '16 14 13  ' ALT 6 - 29 U/L '12 16 12  ' Alk Phosphatase 33 - 130 U/L 52 52 50  Total Bilirubin 0.2 - 1.2 mg/dL 0.5 0.5 0.3  Bilirubin, Direct 0.0 - 0.3 mg/dL - - -     CBC Latest Ref Rng & Units  01/18/2016 09/12/2015 07/04/2015  WBC 3.8 - 10.8 K/uL 6.9 7.6 7.5  Hemoglobin 11.7 - 15.5 g/dL 11.2(L) 10.9(L) 11.2(L)  Hematocrit 35.0 - 45.0 % 35.4 34.9(L) 33.8(L)  Platelets 140 - 400 K/uL 227 228 200.0    No results found for: PROBNP  Lipid Panel     Component Value Date/Time   CHOL 148 09/12/2015 0944  TRIG 103 09/12/2015 0944   HDL 58 09/12/2015 0944   CHOLHDL 2.6 09/12/2015 0944   VLDL 21 09/12/2015 0944   LDLCALC 69 09/12/2015 0944     RADIOLOGY: No results found.  IMPRESSION:  1. CAD in native artery   2. Old MI (myocardial infarction)   3. Hyperlipidemia LDL goal <70   4. Essential hypertension   5. Type 2 diabetes mellitus with diabetic neuropathy, with long-term current use of insulin ([place])   6. Moderate obesity   7. CKD (chronic kidney disease) stage 3, GFR 30-59 ml/min     ASSESSMENT AND PLAN: Lynn Hobbs is a 56 year old African-American female who suffered an ST segment elevation myocardial infarction and presented urgently to the catheterization laboratory in the morning of 03/24/2013. She was found to have subtotal 99% LAD stenosis which was successfully stented with a DES stent.   She initially had an ischemic cardiomyopathy and required an initial LifeVest, but with significant improvement in LV function.  This was subsequently discontinued.  She has continued to be without anginal symptomatology. Blood pressure today was stable on her current regimen consisting of amlodipine 10 mg, carvedilol 37.5 g twice a day, chlorthalidone 25 milligrams daily in addition to losartan 100 mg.  in July 2017, creatinine had increased to 1.46 and was consistent with stage III chronic kidney disease.  Subsequent blood work in November 2017 showed improvement with her creatinine at 1.13.  I reviewed her most recent [place] study which shows findings suggestive of her prior infarct, however, she had normal LV function.  I suspect some of this defect may be related to her  significant large body habitus and breast.  There was no evidence for ischemia.  She continues to be on insulin and metformin for her diabetes mellitus.  She will be having a follow-up evaluation with her primary physician in June.  I will see her in December 2018 for cardiology follow-up evaluation.  Time spent: 25 minutes  Troy Sine, MD, [facility]  05/13/2016 1:51 PM

## 2016-05-12 NOTE — Patient Instructions (Signed)
Your physician recommends that you schedule a follow-up appointment in: December 2018.

## 2016-05-13 ENCOUNTER — Encounter: Payer: Self-pay | Admitting: Internal Medicine

## 2016-07-28 ENCOUNTER — Other Ambulatory Visit: Payer: Self-pay | Admitting: Internal Medicine

## 2016-07-28 NOTE — Telephone Encounter (Signed)
OK to refill

## 2016-07-28 NOTE — Telephone Encounter (Signed)
Last seen 06/2015. Okay to refill?

## 2016-07-28 NOTE — Telephone Encounter (Signed)
Submitted

## 2016-08-13 ENCOUNTER — Ambulatory Visit: Payer: Medicare Other | Admitting: Internal Medicine

## 2016-08-18 ENCOUNTER — Ambulatory Visit (INDEPENDENT_AMBULATORY_CARE_PROVIDER_SITE_OTHER): Payer: Medicare Other | Admitting: Internal Medicine

## 2016-08-18 ENCOUNTER — Encounter: Payer: Self-pay | Admitting: Internal Medicine

## 2016-08-18 VITALS — BP 158/80 | HR 86 | Temp 98.3°F | Ht 67.0 in | Wt 227.0 lb

## 2016-08-18 DIAGNOSIS — Z794 Long term (current) use of insulin: Secondary | ICD-10-CM | POA: Diagnosis not present

## 2016-08-18 DIAGNOSIS — E1140 Type 2 diabetes mellitus with diabetic neuropathy, unspecified: Secondary | ICD-10-CM | POA: Diagnosis not present

## 2016-08-18 DIAGNOSIS — I679 Cerebrovascular disease, unspecified: Secondary | ICD-10-CM | POA: Diagnosis not present

## 2016-08-18 DIAGNOSIS — I10 Essential (primary) hypertension: Secondary | ICD-10-CM

## 2016-08-18 DIAGNOSIS — I2510 Atherosclerotic heart disease of native coronary artery without angina pectoris: Secondary | ICD-10-CM | POA: Diagnosis not present

## 2016-08-18 DIAGNOSIS — E785 Hyperlipidemia, unspecified: Secondary | ICD-10-CM

## 2016-08-18 LAB — MICROALBUMIN / CREATININE URINE RATIO
CREATININE, U: 225.8 mg/dL
MICROALB UR: 17.4 mg/dL — AB (ref 0.0–1.9)
MICROALB/CREAT RATIO: 7.7 mg/g (ref 0.0–30.0)

## 2016-08-18 LAB — HEMOGLOBIN A1C: Hgb A1c MFr Bld: 9.1 % — ABNORMAL HIGH (ref 4.6–6.5)

## 2016-08-18 MED ORDER — CHLORTHALIDONE 25 MG PO TABS: 25.0000 mg | ORAL_TABLET | ORAL | 1 refills | Status: DC

## 2016-08-18 MED ORDER — AMLODIPINE BESYLATE 10 MG PO TABS: 10.0000 mg | ORAL_TABLET | Freq: Every day | ORAL | 2 refills | Status: DC

## 2016-08-18 NOTE — Patient Instructions (Addendum)
WE NOW OFFER   [facility] FAST TRACK!!!  SAME DAY Appointments for ACUTE CARE  Such as: Sprains, Injuries, cuts, abrasions, rashes, muscle pain, joint pain, back pain Colds, flu, sore throats, headache, allergies, cough, fever  Ear pain, sinus and eye infections Abdominal pain, nausea, vomiting, diarrhea, upset stomach Animal/insect bites  3 Easy Ways to Schedule: Walk-In Scheduling Call in scheduling Mychart Sign-up: [email or web address]   Limit your sodium (Salt) intake  Please check your blood pressure on a regular basis.  If it is consistently greater than 150/90, please make an office appointment.   Please check your hemoglobin A1c every 3 months

## 2016-08-18 NOTE — Progress Notes (Signed)
Subjective:    Patient ID: Lynn Hobbs, female    DOB: 10/22/1960, 56 y.o.   MRN: [id number]  HPI  Lab Results  Component Value Date   HGBA1C 8.3 (H) 01/18/2016   56 year old who is seen today for follow-up of diabetes.  She is also followed by endocrinology, but no recent office visits. She states fasting blood sugars are generally well controlled and 104 earlier this morning.  Her lowest fasting blood sugar 92 and highest approximately 155. She states prandial sugars generally run between 160 and 200 Her cardiopulmonary status has been stable. Denies any chest pain or shortness of breath. She has chronic kidney disease and renal indices where, stable.  6 months ago.  She is followed by nephrology as well  Past Medical History:  Diagnosis Date  . BENIGN NEOPLASM OF SKIN SITE UNSPECIFIED 09/08/2008  . CEREBROVASCULAR DISEASE 12/11/2008  . DIABETES MELLITUS, TYPE II 10/12/2006  . Endometriosis   . Fibroids   . HYPERLIPIDEMIA 10/12/2006  . HYPERTENSION 10/12/2006  . PERIPHERAL NEUROPATHY 10/21/2006     Social History   Social History  . Marital status: Single    Spouse name: N/A  . Number of children: N/A  . Years of education: N/A   Occupational History  . Not on file.   Social History Main Topics  . Smoking status: Never Smoker  . Smokeless tobacco: Never Used  . Alcohol use No  . Drug use: No  . Sexual activity: Not on file   Other Topics Concern  . Not on file   Social History Narrative   Regular exercise: no   Caffeine use: ocassionally    Past Surgical History:  Procedure Laterality Date  . ABDOMINAL HYSTERECTOMY    . LEFT HEART CATHETERIZATION WITH CORONARY ANGIOGRAM N/A 03/24/2013   Procedure: LEFT HEART CATHETERIZATION WITH CORONARY ANGIOGRAM;  Surgeon: Troy Sine, MD;  Location: [facility] CATH LAB;  Service: Cardiovascular;  Laterality: N/A;    No family history on file.  Allergies  Allergen Reactions  . Tape Rash    Current Outpatient  Prescriptions on File Prior to Visit  Medication Sig Dispense Refill  . acetaminophen (TYLENOL) 650 MG CR tablet Take 1,300 mg by mouth daily as needed for pain.     Marland Kitchen aspirin EC 81 MG tablet Take 81 mg by mouth daily.    Marland Kitchen atorvastatin (LIPITOR) 80 MG tablet Take 1 tablet (80 mg total) by mouth at bedtime. 90 tablet 3  . carvedilol (COREG) 25 MG tablet TAKE ONE & ONE-HALF TABLETS BY MOUTH TWICE DAILY. 135 tablet 3  . Cholecalciferol (VITAMIN D-3) 5000 units TABS Take 1 tablet by mouth daily.    . clopidogrel (PLAVIX) 75 MG tablet Take 1 tablet (75 mg total) by mouth daily. 90 tablet 3  . diphenoxylate-atropine (LOMOTIL) 2.5-0.025 MG tablet Take 1 tablet by mouth 4 (four) times daily as needed for diarrhea or loose stools. 30 tablet 0  . glucose blood (FREESTYLE TEST STRIPS) test strip Use to test blood sugar 3 times daily as instructed. Dx: E08.40 125 each 11  . insulin aspart (NOVOLOG FLEXPEN) 100 UNIT/ML FlexPen Inject under skin 22 units before brunch, 22 units before dinner 45 mL 2  . Insulin Detemir (LEVEMIR FLEXTOUCH) 100 UNIT/ML Pen Inject 36 Units into the skin at bedtime. 45 mL 2  . KLOR-CON M20 20 MEQ tablet TAKE ONE TABLET BY MOUTH ONCE DAILY 90 tablet 3  . losartan (COZAAR) 100 MG tablet TAKE ONE TABLET BY MOUTH  DAILY 90 tablet 1  . metFORMIN (GLUCOPHAGE) 500 MG tablet Take 1,000 mg by mouth 2 (two) times daily with a meal.    . NITROSTAT 0.4 MG SL tablet PLACE ONE TABLET UNDER THE TONGUE EVERY 5 MINUTES FOR 3 DOSES AS NEEDED FOR CHEST PAIN 20 tablet 2  . NOVOLOG FLEXPEN 100 UNIT/ML FlexPen INJECT 22 UNITS UNDER THE SKIN BEFORE BRUNCH, AND 10 UNITS BEFORE DINNER 15 pen 2  . RELION SHORT PEN NEEDLES 31G X 8 MM MISC USE 4 TIMES DAILY AS NEEDED 100 each 4   No current facility-administered medications on file prior to visit.     BP (!) 158/80 (BP Location: Right Arm, Patient Position: Sitting, Cuff Size: Normal)   Pulse 86   Temp 98.3 F (36.8 C) (Oral)   Ht 5\' 7"  (1.702 m)   Wt  227 lb (103 kg)   SpO2 99%   BMI 35.55 kg/m      Review of Systems  Constitutional: Negative.  Negative for appetite change, fatigue, fever and unexpected weight change.  HENT: Negative for congestion, dental problem, ear pain, hearing loss, mouth sores, nosebleeds, rhinorrhea, sinus pressure, sore throat, tinnitus, trouble swallowing and voice change.   Eyes: Negative for photophobia, pain, discharge, redness and visual disturbance.  Respiratory: Negative for cough, chest tightness and shortness of breath.   Cardiovascular: Negative for chest pain, palpitations and leg swelling.  Gastrointestinal: Negative for abdominal distention, abdominal pain, blood in stool, constipation, diarrhea, nausea, rectal pain and vomiting.  Genitourinary: Negative for difficulty urinating, dysuria, flank pain, frequency, genital sores, hematuria, menstrual problem, pelvic pain, urgency, vaginal bleeding, vaginal discharge and vaginal pain.  Musculoskeletal: Negative for arthralgias, back pain, gait problem, joint swelling and neck stiffness.       Dupuytren's contracture, right fourth finger  Skin: Negative for rash.  Neurological: Negative for dizziness, syncope, speech difficulty, weakness, light-headedness, numbness and headaches.  Hematological: Negative for adenopathy. Does not bruise/bleed easily.  Psychiatric/Behavioral: Negative for agitation, behavioral problems, dysphoric mood, self-injury and suicidal ideas. The patient is not nervous/anxious.        Objective:   Physical Exam  Constitutional: She is oriented to person, place, and time. She appears well-developed and well-nourished.  Blood pressure 140/82  HENT:  Head: Normocephalic.  Right Ear: External ear normal.  Left Ear: External ear normal.  Mouth/Throat: Oropharynx is clear and moist.  Eyes: Conjunctivae and EOM are normal. Pupils are equal, round, and reactive to light.  Neck: Normal range of motion. Neck supple. No thyromegaly  present.  Cardiovascular: Normal rate, regular rhythm, normal heart sounds and intact distal pulses.   Pulmonary/Chest: Effort normal and breath sounds normal.  Abdominal: Soft. Bowel sounds are normal. She exhibits no mass. There is no tenderness.  Musculoskeletal: Normal range of motion.  Lymphadenopathy:    She has no cervical adenopathy.  Neurological: She is alert and oriented to person, place, and time.  Skin: Skin is warm and dry. No rash noted.  Psychiatric: She has a normal mood and affect. Her behavior is normal.          Assessment & Plan:   Diabetes.  Will review a hemoglobin A1c.  Continue basal bolus insulin Essential hypertension.  Fair control Coronary artery disease, stable Chronic kidney disease, stable  No change in medical regimen Reviewed hemoglobin A1c Continue biannual eye examinations Endocrine follow-up Return here in 6 months  [id number],PETER Pilar Plate

## 2016-08-19 LAB — HEPATITIS C ANTIBODY: HCV Ab: NEGATIVE

## 2016-08-19 MED ORDER — INSULIN ASPART 100 UNIT/ML FLEXPEN: 26.0000 [IU] | PEN_INJECTOR | Freq: Two times a day (BID) | SUBCUTANEOUS | 11 refills | Status: DC

## 2016-08-19 NOTE — Addendum Note (Signed)
Addended by: Abelardo Diesel on: 08/19/2016 09:34 AM   Modules accepted: Orders

## 2016-09-25 ENCOUNTER — Other Ambulatory Visit: Payer: Self-pay | Admitting: Internal Medicine

## 2016-11-12 ENCOUNTER — Ambulatory Visit (INDEPENDENT_AMBULATORY_CARE_PROVIDER_SITE_OTHER): Payer: Medicare Other | Admitting: Ophthalmology

## 2016-11-12 DIAGNOSIS — E103512 Type 1 diabetes mellitus with proliferative diabetic retinopathy with macular edema, left eye: Secondary | ICD-10-CM

## 2016-11-12 DIAGNOSIS — E10311 Type 1 diabetes mellitus with unspecified diabetic retinopathy with macular edema: Secondary | ICD-10-CM | POA: Diagnosis not present

## 2016-11-12 DIAGNOSIS — H43813 Vitreous degeneration, bilateral: Secondary | ICD-10-CM | POA: Diagnosis not present

## 2016-11-12 DIAGNOSIS — H35033 Hypertensive retinopathy, bilateral: Secondary | ICD-10-CM | POA: Diagnosis not present

## 2016-11-12 DIAGNOSIS — H2513 Age-related nuclear cataract, bilateral: Secondary | ICD-10-CM | POA: Diagnosis not present

## 2016-11-12 DIAGNOSIS — I10 Essential (primary) hypertension: Secondary | ICD-10-CM

## 2016-11-12 DIAGNOSIS — E103591 Type 1 diabetes mellitus with proliferative diabetic retinopathy without macular edema, right eye: Secondary | ICD-10-CM | POA: Diagnosis not present

## 2016-11-13 ENCOUNTER — Emergency Department (HOSPITAL_COMMUNITY)
Admission: EM | Admit: 2016-11-13 | Discharge: 2016-11-13 | Disposition: A | Discharge status: Home / Self Care | Payer: Medicare Other | Attending: Emergency Medicine | Admitting: Emergency Medicine

## 2016-11-13 ENCOUNTER — Emergency Department (HOSPITAL_COMMUNITY): Payer: Medicare Other

## 2016-11-13 ENCOUNTER — Encounter (HOSPITAL_COMMUNITY): Payer: Self-pay | Admitting: Emergency Medicine

## 2016-11-13 DIAGNOSIS — R42 Dizziness and giddiness: Secondary | ICD-10-CM | POA: Diagnosis not present

## 2016-11-13 DIAGNOSIS — N390 Urinary tract infection, site not specified: Secondary | ICD-10-CM | POA: Diagnosis not present

## 2016-11-13 DIAGNOSIS — E785 Hyperlipidemia, unspecified: Secondary | ICD-10-CM | POA: Diagnosis not present

## 2016-11-13 DIAGNOSIS — Z794 Long term (current) use of insulin: Secondary | ICD-10-CM | POA: Diagnosis not present

## 2016-11-13 DIAGNOSIS — Z7982 Long term (current) use of aspirin: Secondary | ICD-10-CM | POA: Insufficient documentation

## 2016-11-13 DIAGNOSIS — E119 Type 2 diabetes mellitus without complications: Secondary | ICD-10-CM | POA: Insufficient documentation

## 2016-11-13 DIAGNOSIS — N189 Chronic kidney disease, unspecified: Secondary | ICD-10-CM | POA: Insufficient documentation

## 2016-11-13 DIAGNOSIS — I129 Hypertensive chronic kidney disease with stage 1 through stage 4 chronic kidney disease, or unspecified chronic kidney disease: Secondary | ICD-10-CM | POA: Diagnosis not present

## 2016-11-13 DIAGNOSIS — Z79899 Other long term (current) drug therapy: Secondary | ICD-10-CM | POA: Diagnosis not present

## 2016-11-13 DIAGNOSIS — Z8673 Personal history of transient ischemic attack (TIA), and cerebral infarction without residual deficits: Secondary | ICD-10-CM | POA: Diagnosis not present

## 2016-11-13 DIAGNOSIS — R9431 Abnormal electrocardiogram [ECG] [EKG]: Secondary | ICD-10-CM | POA: Diagnosis not present

## 2016-11-13 DIAGNOSIS — R29818 Other symptoms and signs involving the nervous system: Secondary | ICD-10-CM | POA: Diagnosis not present

## 2016-11-13 HISTORY — DX: Cerebral infarction, unspecified: I63.9

## 2016-11-13 LAB — I-STAT CHEM 8, ED
BUN: 16 mg/dL (ref 6–20)
CALCIUM ION: 1.11 mmol/L — AB (ref 1.15–1.40)
CHLORIDE: 105 mmol/L (ref 101–111)
Creatinine, Ser: 0.8 mg/dL (ref 0.44–1.00)
GLUCOSE: 197 mg/dL — AB (ref 65–99)
HCT: 37 % (ref 36.0–46.0)
Hemoglobin: 12.6 g/dL (ref 12.0–15.0)
POTASSIUM: 3.7 mmol/L (ref 3.5–5.1)
SODIUM: 141 mmol/L (ref 135–145)
TCO2: 24 mmol/L (ref 22–32)

## 2016-11-13 LAB — URINALYSIS, ROUTINE W REFLEX MICROSCOPIC
BILIRUBIN URINE: NEGATIVE
GLUCOSE, UA: 50 mg/dL — AB
HGB URINE DIPSTICK: NEGATIVE
Ketones, ur: NEGATIVE mg/dL
Leukocytes, UA: NEGATIVE
NITRITE: POSITIVE — AB
Protein, ur: 100 mg/dL — AB
SPECIFIC GRAVITY, URINE: 1.013 (ref 1.005–1.030)
pH: 7 (ref 5.0–8.0)

## 2016-11-13 LAB — COMPREHENSIVE METABOLIC PANEL
ALBUMIN: 4 g/dL (ref 3.5–5.0)
ALK PHOS: 41 U/L (ref 38–126)
ALT: 33 U/L (ref 14–54)
AST: 23 U/L (ref 15–41)
Anion gap: 11 (ref 5–15)
BUN: 14 mg/dL (ref 6–20)
CALCIUM: 9.4 mg/dL (ref 8.9–10.3)
CHLORIDE: 106 mmol/L (ref 101–111)
CO2: 22 mmol/L (ref 22–32)
CREATININE: 0.92 mg/dL (ref 0.44–1.00)
GFR calc non Af Amer: >60 mL/min (ref >60)
GLUCOSE: 191 mg/dL — AB (ref 65–99)
Potassium: 3.7 mmol/L (ref 3.5–5.1)
SODIUM: 139 mmol/L (ref 135–145)
Total Bilirubin: 0.8 mg/dL (ref 0.3–1.2)
Total Protein: 8 g/dL (ref 6.5–8.1)

## 2016-11-13 LAB — DIFFERENTIAL
BASOS ABS: 0 10*3/uL (ref 0.0–0.1)
BASOS PCT: 0 %
Eosinophils Absolute: 0.2 10*3/uL (ref 0.0–0.7)
Eosinophils Relative: 2 %
LYMPHS PCT: 30 %
Lymphs Abs: 2.2 10*3/uL (ref 0.7–4.0)
Monocytes Absolute: 0.5 10*3/uL (ref 0.1–1.0)
Monocytes Relative: 7 %
NEUTROS ABS: 4.4 10*3/uL (ref 1.7–7.7)
NEUTROS PCT: 61 %

## 2016-11-13 LAB — APTT: aPTT: 29 seconds (ref 24–36)

## 2016-11-13 LAB — CBC
HCT: 36 % (ref 36.0–46.0)
HEMOGLOBIN: 11.6 g/dL — AB (ref 12.0–15.0)
MCH: 24.8 pg — AB (ref 26.0–34.0)
MCHC: 32.2 g/dL (ref 30.0–36.0)
MCV: 76.9 fL — AB (ref 78.0–100.0)
PLATELETS: 190 10*3/uL (ref 150–400)
RBC: 4.68 MIL/uL (ref 3.87–5.11)
RDW: 16.5 % — AB (ref 11.5–15.5)
WBC: 7.3 10*3/uL (ref 4.0–10.5)

## 2016-11-13 LAB — PROTIME-INR
INR: 1.03
Prothrombin Time: 13.4 seconds (ref 11.4–15.2)

## 2016-11-13 LAB — I-STAT TROPONIN, ED: Troponin i, poc: 0 ng/mL (ref 0.00–0.08)

## 2016-11-13 MED ORDER — DEXTROSE 5 % IV SOLN
1.0000 g | Freq: Once | INTRAVENOUS | Status: AC
  Administered 2016-11-13: 1 g via INTRAVENOUS
  Filled 2016-11-13: qty 10

## 2016-11-13 MED ORDER — CEPHALEXIN 500 MG PO CAPS: 500.0000 mg | ORAL_CAPSULE | Freq: Four times a day (QID) | ORAL | 0 refills | Status: DC

## 2016-11-13 NOTE — ED Notes (Signed)
LKW 0000, pt woke up this AM approx 0300 and was having difficulty walking d/t dizziness. Called EMS, pt was hypertensive (210/110) upon their arrival did not have BP meds yesterday d/t have procedure to L eye. Weakness noted on L side but this is her "normal" d/t previous stroke, no new neuro deficits. BP WNL here.

## 2016-11-13 NOTE — ED Provider Notes (Signed)
[place] DEPT Provider Note   CSN: [id number] Arrival date & time: 11/13/16  4098     History   Chief Complaint Chief Complaint  Patient presents with  . Dizziness    HPI Lynn Hobbs is a 56 y.o. female.  The history is provided by the patient and a parent. No language interpreter was used.  Dizziness   Lynn Hobbs is a 56 y.o. female who presents to the Emergency Department complaining of dizziness.  She was last seen normal at midnight last night. When she awoke at 3:00 this morning she had dizziness described as room spinning sensation as well as an unsteady gait and increased weakness on the left side. She had eye surgery yesterday. She does take daily Plavix but did not need to stop it for the surgery. She has a history of hemorrhagic stroke in 2005 with TIA in 2007. She denies any numbness. She does have chronic left sided weakness and this is slightly worse today than her baseline. She denies any additional complaints. Past Medical History:  Diagnosis Date  . BENIGN NEOPLASM OF SKIN SITE UNSPECIFIED 09/08/2008  . CEREBROVASCULAR DISEASE 12/11/2008  . DIABETES MELLITUS, TYPE II 10/12/2006  . Endometriosis   . Fibroids   . HYPERLIPIDEMIA 10/12/2006  . HYPERTENSION 10/12/2006  . PERIPHERAL NEUROPATHY 10/21/2006  . Stroke [facility])     Patient Active Problem List   Diagnosis Date Noted  . Chronic kidney disease (CKD) 08/22/2015  . Type 2 diabetes mellitus with diabetic neuropathy, with long-term current use of insulin ([place]) 04/04/2015  . Hyperlipidemia LDL goal <70 11/16/2014  . Old MI (myocardial infarction) 11/16/2014  . Obesity (BMI 30-39.9) 11/03/2013  . Secondary cardiomyopathy, unspecified 03/26/2013  . ACS (acute coronary syndrome) ([place]) 03/24/2013  . ST elevation myocardial infarction (STEMI) of anterior wall ([place]) 03/24/2013  . Cerebrovascular disease 12/11/2008  . BENIGN NEOPLASM OF SKIN SITE UNSPECIFIED 09/08/2008  . Dyslipidemia 10/12/2006  . Essential  hypertension 10/12/2006    Past Surgical History:  Procedure Laterality Date  . ABDOMINAL HYSTERECTOMY    . LEFT HEART CATHETERIZATION WITH CORONARY ANGIOGRAM N/A 03/24/2013   Procedure: LEFT HEART CATHETERIZATION WITH CORONARY ANGIOGRAM;  Surgeon: Troy Sine, MD;  Location: [facility] CATH LAB;  Service: Cardiovascular;  Laterality: N/A;    OB History    No data available       Home Medications    Prior to Admission medications   Medication Sig Start Date End Date Taking? Authorizing Provider  acetaminophen (TYLENOL) 650 MG CR tablet Take 1,300 mg by mouth daily as needed for pain.    Yes [provider]  amLODipine (NORVASC) 10 MG tablet Take 1 tablet (10 mg total) by mouth daily. 08/18/16  Yes Marletta Lor, MD  aspirin EC 81 MG tablet Take 81 mg by mouth daily.   Yes [provider]  atorvastatin (LIPITOR) 80 MG tablet Take 1 tablet (80 mg total) by mouth at bedtime. 03/14/16  Yes Troy Sine, MD  carvedilol (COREG) 25 MG tablet TAKE ONE & ONE-HALF TABLETS BY MOUTH TWICE DAILY. 03/14/16  Yes Troy Sine, MD  chlorthalidone (HYGROTON) 25 MG tablet Take 1 tablet (25 mg total) by mouth every other day. 08/18/16  Yes Marletta Lor, MD  clopidogrel (PLAVIX) 75 MG tablet Take 1 tablet (75 mg total) by mouth daily. 05/12/16  Yes Troy Sine, MD  diphenoxylate-atropine (LOMOTIL) 2.5-0.025 MG tablet Take 1 tablet by mouth 4 (four) times daily as needed for diarrhea  or loose stools. 02/13/15  Yes Marletta Lor, MD  insulin aspart (NOVOLOG) 100 UNIT/ML FlexPen Inject 26 Units into the skin 2 (two) times daily before a meal. (lunch and dinner) 08/19/16  Yes Marletta Lor, MD  Insulin Detemir (LEVEMIR FLEXTOUCH) 100 UNIT/ML Pen Inject 36 Units into the skin at bedtime. 08/03/15  Yes Philemon Kingdom, MD  KLOR-CON M20 20 MEQ tablet TAKE ONE TABLET BY MOUTH ONCE DAILY 12/17/15  Yes Marletta Lor, MD  losartan (COZAAR) 100 MG tablet TAKE ONE TABLET  BY MOUTH ONCE DAILY 09/25/16  Yes Marletta Lor, MD  Menthol, Topical Analgesic, (BLUE-EMU MAXIMUM STRENGTH) 2.5 % LIQD Apply 1 spray topically as needed (pain).   Yes [provider]  prednisoLONE acetate (PRED FORTE) 1 % ophthalmic suspension Place 1 drop into the left eye 4 (four) times daily. 11/12/16  Yes [provider]  cephALEXin (KEFLEX) 500 MG capsule Take 1 capsule (500 mg total) by mouth 4 (four) times daily. 11/13/16   Quintella Reichert, MD  Cholecalciferol (VITAMIN D-3) 5000 units TABS Take 1 tablet by mouth daily.    [provider]  NITROSTAT 0.4 MG SL tablet PLACE ONE TABLET UNDER THE TONGUE EVERY 5 MINUTES FOR 3 DOSES AS NEEDED FOR CHEST PAIN 10/12/15   Troy Sine, MD    Family History No family history on file.  Social History Social History  Substance Use Topics  . Smoking status: Never Smoker  . Smokeless tobacco: Never Used  . Alcohol use No     Allergies   Tape   Review of Systems Review of Systems  Neurological: Positive for dizziness.  All other systems reviewed and are negative.    Physical Exam Updated Vital Signs BP 136/73   Pulse 74   Temp 98.2 F (36.8 C) (Oral)   Resp 16   Ht 5\' 6"  (1.676 m)   Wt 103.4 kg (228 lb)   SpO2 99%   BMI 36.80 kg/m   Physical Exam  Constitutional: She is oriented to person, place, and time. She appears well-developed and well-nourished.  HENT:  Head: Normocephalic and atraumatic.  Cardiovascular: Normal rate and regular rhythm.   No murmur heard. Pulmonary/Chest: Effort normal and breath sounds normal. No respiratory distress.  Abdominal: Soft. There is no tenderness. There is no rebound and no guarding.  Musculoskeletal: She exhibits no edema or tenderness.  Neurological: She is alert and oriented to person, place, and time.  Mild left lower facial droop.  Mild LUE/LLE weakness.  Visual fields grossly intact.    Skin: Skin is warm and dry.  Psychiatric: She has a normal  mood and affect. Her behavior is normal.  Nursing note and vitals reviewed.    ED Treatments / Results  Labs (all labs ordered are listed, but only abnormal results are displayed) Labs Reviewed  CBC - Abnormal; Notable for the following:       Result Value   Hemoglobin 11.6 (*)    MCV 76.9 (*)    MCH 24.8 (*)    RDW 16.5 (*)    All other components within normal limits  COMPREHENSIVE METABOLIC PANEL - Abnormal; Notable for the following:    Glucose, Bld 191 (*)    All other components within normal limits  URINALYSIS, ROUTINE W REFLEX MICROSCOPIC - Abnormal; Notable for the following:    Color, Urine AMBER (*)    APPearance HAZY (*)    Glucose, UA 50 (*)    Protein, ur 100 (*)  Nitrite POSITIVE (*)    Bacteria, UA MANY (*)    Squamous Epithelial / LPF 0-5 (*)    All other components within normal limits  I-STAT CHEM 8, ED - Abnormal; Notable for the following:    Glucose, Bld 197 (*)    Calcium, Ion 1.11 (*)    All other components within normal limits  URINE CULTURE  PROTIME-INR  APTT  DIFFERENTIAL  I-STAT TROPONIN, ED    EKG  EKG Interpretation  Date/Time:  Thursday November 13 2016 05:18:54 EDT Ventricular Rate:  88 PR Interval:  162 QRS Duration: 82 QT Interval:  374 QTC Calculation: 452 R Axis:   -42 Text Interpretation:  Normal sinus rhythm Left axis deviation Inferior infarct , age undetermined Anteroseptal infarct , age undetermined Abnormal ECG Confirmed by Quintella Reichert [id number]) on 11/13/2016 8:43:39 AM       Radiology Ct Head Wo Contrast  Result Date: 11/13/2016 CLINICAL DATA:  Pt had procedure to left eye yesterday and did not take blood pressure medicine, woke up at 3am with unsteady gait and dizziness blood pressure 210/110 with arrival to hospital, history 2 strokes. Focal neurological deficits greater than 6 hours. EXAM: CT HEAD WITHOUT CONTRAST TECHNIQUE: Contiguous axial images were obtained from the base of the skull through the vertex  without intravenous contrast. COMPARISON:  None. FINDINGS: Brain: Areas of low-attenuation and encephalomalacia demonstrated in the right centrum semiovale, right basal ganglia, and extending into the right temporal lobe centrally. This is consistent with old infarct. The area of abnormality appears to be larger than on the previous study, suggesting possible acute progression. Consider MR for further evaluation. Patchy low-attenuation changes in the deep white matter bilaterally consistent with chronic small vessel ischemia. Mild asymmetric ventricular dilatation on the right consistent with volume loss from old stroke. Old left cerebellar infarct inferiorly. No abnormal extra-axial fluid collections. Gray-white matter junctions are distinct. Basal cisterns are not effaced. No acute intracranial hemorrhage. Vascular: No hyperdense vessel or unexpected calcification. Skull: Normal. Negative for fracture or focal lesion. Sinuses/Orbits: No acute finding. Other: None. IMPRESSION: Old infarcts in the right cerebral hemisphere and left inferior cerebellum. Patchy small vessel ischemic changes in the deep white matter. Possible acute progression of ischemia on the right. Consider MRI for further evaluation. No acute intracranial hemorrhage. Electronically Signed   By: Lucienne Capers M.D.   On: 11/13/2016 05:56   Mr Brain Wo Contrast (neuro Protocol)  Result Date: 11/13/2016 CLINICAL DATA:  Focal neuro deficit less than 6 hours, stroke suspected EXAM: MRI HEAD WITHOUT CONTRAST TECHNIQUE: Multiplanar, multiecho pulse sequences of the brain and surrounding structures were obtained without intravenous contrast. COMPARISON:  CT 11/13/2016, MRI 04/14/2014 FINDINGS: Brain: Negative for acute infarct. Chronic hemorrhagic infarction in the right lateral basal ganglia unchanged. Hyperintensity in the deep white matter right greater than left unchanged. Chronic infarct left PICA territory unchanged. Negative for mass or  hydrocephalus Vascular: Normal arterial flow voids Skull and upper cervical spine: Negative Sinuses/Orbits: Mild mucosal edema paranasal sinuses. Negative orbit Other: None IMPRESSION: No acute intracranial abnormality. Chronic hemorrhagic infarction right basal ganglia unchanged. Electronically Signed   By: Franchot Gallo M.D.   On: 11/13/2016 09:29    Procedures Procedures (including critical care time)  Medications Ordered in ED Medications  cefTRIAXone (ROCEPHIN) 1 g in dextrose 5 % 50 mL IVPB (0 g Intravenous Stopped 11/13/16 1156)     Initial Impression / Assessment and Plan / ED Course  I have reviewed the triage vital signs and  the nursing notes.  Pertinent labs & imaging results that were available during my care of the patient were reviewed by me and considered in my medical decision making (see chart for details).     Patient with history of prior CVA here for evaluation of dizziness and feeling unsteady. She does have left-sided weakness on examination and this is in the region of her prior stroke but she feels it is slightly worse than previously. The CT scan is concerning for progressive ischemia. Discussed with neurologist who recommends MRI to further evaluate. MRI is negative for acute ischemia. UA is consistent with UTI. Discussed with patient findings of UTI, will treat with antibiotics. She is able to ambulate and she is nontoxic on examination. Plan to DC home on antibiotics with close outpatient follow-up and return precautions.  Final Clinical Impressions(s) / ED Diagnoses   Final diagnoses:  Dizziness  Acute UTI    New Prescriptions Discharge Medication List as of 11/13/2016 11:52 AM    START taking these medications   Details  cephALEXin (KEFLEX) 500 MG capsule Take 1 capsule (500 mg total) by mouth 4 (four) times daily., Starting Thu 11/13/2016, Print         Quintella Reichert, MD 11/14/16 [phone]

## 2016-11-13 NOTE — ED Notes (Signed)
Patient transported to MRI 

## 2016-11-14 DIAGNOSIS — R404 Transient alteration of awareness: Secondary | ICD-10-CM | POA: Diagnosis not present

## 2016-11-14 DIAGNOSIS — R42 Dizziness and giddiness: Secondary | ICD-10-CM | POA: Diagnosis not present

## 2016-11-15 LAB — URINE CULTURE: Culture: >=100000 — AB

## 2016-11-16 ENCOUNTER — Telehealth: Payer: Self-pay | Admitting: *Deleted

## 2016-11-16 NOTE — Telephone Encounter (Signed)
Post ED Visit - Positive Culture Follow-up  Culture report reviewed by antimicrobial stewardship pharmacist:  []  Elenor Quinones, Pharm.D. []  Heide Guile, Pharm.D., BCPS AQ-ID []  Parks Neptune, Pharm.D., BCPS []  Alycia Rossetti, Pharm.D., BCPS []  [place], Pharm.D., BCPS, AAHIVP []  Legrand Como, Pharm.D., BCPS, AAHIVP [x]  Salome Arnt, PharmD, BCPS []  Dimitri Ped, PharmD, BCPS []  Vincenza Hews, PharmD, BCPS  Positive urine culture Treated with Cephalexin, organism sensitive to the same and no further patient follow-up is required at this time.  Harlon Flor [facility] 11/16/2016, 11:15 AM

## 2016-11-19 ENCOUNTER — Encounter (INDEPENDENT_AMBULATORY_CARE_PROVIDER_SITE_OTHER): Payer: Medicare Other | Admitting: Ophthalmology

## 2016-11-19 ENCOUNTER — Ambulatory Visit (INDEPENDENT_AMBULATORY_CARE_PROVIDER_SITE_OTHER): Payer: Medicare Other | Admitting: Internal Medicine

## 2016-11-19 ENCOUNTER — Encounter: Payer: Self-pay | Admitting: Internal Medicine

## 2016-11-19 VITALS — BP 152/72 | HR 90 | Temp 98.5°F | Ht 66.0 in | Wt 223.8 lb

## 2016-11-19 DIAGNOSIS — I679 Cerebrovascular disease, unspecified: Secondary | ICD-10-CM

## 2016-11-19 DIAGNOSIS — Z794 Long term (current) use of insulin: Secondary | ICD-10-CM | POA: Diagnosis not present

## 2016-11-19 DIAGNOSIS — I2510 Atherosclerotic heart disease of native coronary artery without angina pectoris: Secondary | ICD-10-CM

## 2016-11-19 DIAGNOSIS — E785 Hyperlipidemia, unspecified: Secondary | ICD-10-CM

## 2016-11-19 DIAGNOSIS — I10 Essential (primary) hypertension: Secondary | ICD-10-CM

## 2016-11-19 DIAGNOSIS — E1140 Type 2 diabetes mellitus with diabetic neuropathy, unspecified: Secondary | ICD-10-CM | POA: Diagnosis not present

## 2016-11-19 MED ORDER — LOSARTAN POTASSIUM 100 MG PO TABS: 100.0000 mg | ORAL_TABLET | Freq: Every day | ORAL | 3 refills | Status: DC

## 2016-11-19 MED ORDER — INSULIN DETEMIR 100 UNIT/ML FLEXPEN: 36.0000 [IU] | PEN_INJECTOR | Freq: Every day | SUBCUTANEOUS | 2 refills | Status: DC

## 2016-11-19 MED ORDER — CHLORTHALIDONE 25 MG PO TABS: 25.0000 mg | ORAL_TABLET | ORAL | 3 refills | Status: DC

## 2016-11-19 MED ORDER — POTASSIUM CHLORIDE CRYS ER 20 MEQ PO TBCR: 20.0000 meq | EXTENDED_RELEASE_TABLET | Freq: Every day | ORAL | 3 refills | Status: DC

## 2016-11-19 MED ORDER — DIPHENOXYLATE-ATROPINE 2.5-0.025 MG PO TABS: 1.0000 | ORAL_TABLET | Freq: Four times a day (QID) | ORAL | 0 refills | Status: DC | PRN

## 2016-11-19 MED ORDER — PEN NEEDLES 32G X 4 MM MISC: 1.0000 | Freq: Four times a day (QID) | 6 refills | Status: DC

## 2016-11-19 NOTE — Patient Instructions (Signed)
Return as scheduled for your usual follow-up  Medicare wellness visit.  December as scheduled  Call or return to clinic prn if these symptoms worsen or fail to improve as anticipated.

## 2016-11-19 NOTE — Progress Notes (Signed)
Subjective:    Patient ID: Lynn Hobbs, female    DOB: 10/01/1960, 56 y.o.   MRN: [id number]  HPI 56 year old patient who is seen today following a recent ED evaluation. She presented 6 days ago after an episode of vertigo while ambulating from the bathroom.  The vertigo lasted 1 or 2 minutes.  She has had at least one prior episode of vertigo in the past.  There is also concerns about increasing left-sided weakness. ED evaluation included a brain MRI that revealed her old right basal ganglia infarct but no evidence of acute stroke.  She has had no recurrent vertigo She was treated in the ED for a UTI and is completing antibiotic therapy.  Urine culture revealed Escherichia coli which was pansensitive. Her only other complaint today is right knee pain.  This has been present for several months.  She has been using Tylenol and some topical agents.  No history of effusion or recent trauma  Past Medical History:  Diagnosis Date  . BENIGN NEOPLASM OF SKIN SITE UNSPECIFIED 09/08/2008  . CEREBROVASCULAR DISEASE 12/11/2008  . DIABETES MELLITUS, TYPE II 10/12/2006  . Endometriosis   . Fibroids   . HYPERLIPIDEMIA 10/12/2006  . HYPERTENSION 10/12/2006  . PERIPHERAL NEUROPATHY 10/21/2006  . Stroke [facility])      Social History   Social History  . Marital status: Single    Spouse name: N/A  . Number of children: N/A  . Years of education: N/A   Occupational History  . Not on file.   Social History Main Topics  . Smoking status: Never Smoker  . Smokeless tobacco: Never Used  . Alcohol use No  . Drug use: No  . Sexual activity: Not on file   Other Topics Concern  . Not on file   Social History Narrative   Regular exercise: no   Caffeine use: ocassionally    Past Surgical History:  Procedure Laterality Date  . ABDOMINAL HYSTERECTOMY    . LEFT HEART CATHETERIZATION WITH CORONARY ANGIOGRAM N/A 03/24/2013   Procedure: LEFT HEART CATHETERIZATION WITH CORONARY ANGIOGRAM;  Surgeon: Troy Sine, MD;  Location: [facility] CATH LAB;  Service: Cardiovascular;  Laterality: N/A;    No family history on file.  Allergies  Allergen Reactions  . Tape Rash    Current Outpatient Prescriptions on File Prior to Visit  Medication Sig Dispense Refill  . acetaminophen (TYLENOL) 650 MG CR tablet Take 1,300 mg by mouth daily as needed for pain.     Marland Kitchen amLODipine (NORVASC) 10 MG tablet Take 1 tablet (10 mg total) by mouth daily. 90 tablet 2  . aspirin EC 81 MG tablet Take 81 mg by mouth daily.    Marland Kitchen atorvastatin (LIPITOR) 80 MG tablet Take 1 tablet (80 mg total) by mouth at bedtime. 90 tablet 3  . carvedilol (COREG) 25 MG tablet TAKE ONE & ONE-HALF TABLETS BY MOUTH TWICE DAILY. 135 tablet 3  . cephALEXin (KEFLEX) 500 MG capsule Take 1 capsule (500 mg total) by mouth 4 (four) times daily. 20 capsule 0  . Cholecalciferol (VITAMIN D-3) 5000 units TABS Take 1 tablet by mouth daily.    . clopidogrel (PLAVIX) 75 MG tablet Take 1 tablet (75 mg total) by mouth daily. 90 tablet 3  . insulin aspart (NOVOLOG) 100 UNIT/ML FlexPen Inject 26 Units into the skin 2 (two) times daily before a meal. (lunch and dinner) 4 pen 11  . Menthol, Topical Analgesic, (BLUE-EMU MAXIMUM STRENGTH) 2.5 % LIQD Apply 1 spray  topically as needed (pain).    Marland Kitchen NITROSTAT 0.4 MG SL tablet PLACE ONE TABLET UNDER THE TONGUE EVERY 5 MINUTES FOR 3 DOSES AS NEEDED FOR CHEST PAIN 20 tablet 2  . prednisoLONE acetate (PRED FORTE) 1 % ophthalmic suspension Place 1 drop into the left eye 4 (four) times daily.     No current facility-administered medications on file prior to visit.     BP (!) 152/72 (BP Location: Left Arm, Patient Position: Sitting, Cuff Size: Normal)   Pulse 90   Temp 98.5 F (36.9 C) (Oral)   Ht 5\' 6"  (1.676 m)   Wt 223 lb 12.8 oz (101.5 kg)   SpO2 99%   BMI 36.12 kg/m      Review of Systems  Constitutional: Negative.   HENT: Negative for congestion, dental problem, hearing loss, rhinorrhea, sinus pressure, sore  throat and tinnitus.   Eyes: Negative for pain, discharge and visual disturbance.  Respiratory: Negative for cough and shortness of breath.   Cardiovascular: Negative for chest pain, palpitations and leg swelling.  Gastrointestinal: Negative for abdominal distention, abdominal pain, blood in stool, constipation, diarrhea, nausea and vomiting.  Genitourinary: Negative for difficulty urinating, dysuria, flank pain, frequency, hematuria, pelvic pain, urgency, vaginal bleeding, vaginal discharge and vaginal pain.  Musculoskeletal: Positive for arthralgias and gait problem. Negative for joint swelling.  Skin: Negative for rash.  Neurological: Positive for dizziness. Negative for syncope, speech difficulty, weakness, numbness and headaches.  Hematological: Negative for adenopathy.  Psychiatric/Behavioral: Negative for agitation, behavioral problems and dysphoric mood. The patient is not nervous/anxious.        Objective:   Physical Exam  Constitutional: She is oriented to person, place, and time. She appears well-developed and well-nourished.  HENT:  Head: Normocephalic.  Right Ear: External ear normal.  Left Ear: External ear normal.  Mouth/Throat: Oropharynx is clear and moist.  Eyes: Pupils are equal, round, and reactive to light. Conjunctivae and EOM are normal.  Neck: Normal range of motion. Neck supple. No thyromegaly present.  Cardiovascular: Normal rate, regular rhythm, normal heart sounds and intact distal pulses.   Pulmonary/Chest: Effort normal and breath sounds normal.  Abdominal: Soft. Bowel sounds are normal. She exhibits no mass. There is no tenderness.  Musculoskeletal: Normal range of motion. She exhibits no edema or tenderness.  Chronic left-sided weakness  Right knee unremarkable  Lymphadenopathy:    She has no cervical adenopathy.  Neurological: She is alert and oriented to person, place, and time.  Skin: Skin is warm and dry. No rash noted.  Psychiatric: She has a  normal mood and affect. Her behavior is normal.          Assessment & Plan:   Status post benign positional vertigo Cervical vascular disease Right knee pain.  Pain is tolerable and relieved by Tylenol.  Will observe at this time.  We'll consider further evaluation if situation worsens Essential hypertension, stable Diabetes mellitus.  Follow-up as scheduled  Nyoka Cowden

## 2016-11-23 ENCOUNTER — Other Ambulatory Visit: Payer: Self-pay | Admitting: Cardiovascular Disease

## 2016-11-24 NOTE — Telephone Encounter (Signed)
Rx(s) sent to pharmacy electronically.  

## 2016-11-27 ENCOUNTER — Encounter: Payer: Self-pay | Admitting: Internal Medicine

## 2016-12-01 ENCOUNTER — Encounter (INDEPENDENT_AMBULATORY_CARE_PROVIDER_SITE_OTHER): Payer: Medicare Other | Admitting: Ophthalmology

## 2016-12-01 DIAGNOSIS — E11311 Type 2 diabetes mellitus with unspecified diabetic retinopathy with macular edema: Secondary | ICD-10-CM

## 2016-12-01 DIAGNOSIS — E113591 Type 2 diabetes mellitus with proliferative diabetic retinopathy without macular edema, right eye: Secondary | ICD-10-CM | POA: Diagnosis not present

## 2016-12-18 ENCOUNTER — Encounter (INDEPENDENT_AMBULATORY_CARE_PROVIDER_SITE_OTHER): Payer: Medicare Other | Admitting: Ophthalmology

## 2016-12-18 DIAGNOSIS — E11311 Type 2 diabetes mellitus with unspecified diabetic retinopathy with macular edema: Secondary | ICD-10-CM | POA: Diagnosis not present

## 2016-12-18 DIAGNOSIS — E113512 Type 2 diabetes mellitus with proliferative diabetic retinopathy with macular edema, left eye: Secondary | ICD-10-CM

## 2016-12-29 ENCOUNTER — Other Ambulatory Visit: Payer: Self-pay | Admitting: Cardiovascular Disease

## 2016-12-31 MED ORDER — CARVEDILOL 25 MG PO TABS: 37.5000 mg | ORAL_TABLET | Freq: Two times a day (BID) | ORAL | 0 refills | Status: DC

## 2017-02-03 ENCOUNTER — Encounter: Payer: Self-pay | Admitting: Cardiovascular Disease

## 2017-02-03 ENCOUNTER — Ambulatory Visit (INDEPENDENT_AMBULATORY_CARE_PROVIDER_SITE_OTHER): Payer: Medicare Other | Admitting: Cardiovascular Disease

## 2017-02-03 VITALS — BP 166/80 | HR 77 | Ht 66.0 in | Wt 227.0 lb

## 2017-02-03 DIAGNOSIS — I252 Old myocardial infarction: Secondary | ICD-10-CM | POA: Diagnosis not present

## 2017-02-03 DIAGNOSIS — I10 Essential (primary) hypertension: Secondary | ICD-10-CM

## 2017-02-03 DIAGNOSIS — I2510 Atherosclerotic heart disease of native coronary artery without angina pectoris: Secondary | ICD-10-CM | POA: Diagnosis not present

## 2017-02-03 DIAGNOSIS — R5383 Other fatigue: Secondary | ICD-10-CM | POA: Diagnosis not present

## 2017-02-03 DIAGNOSIS — E669 Obesity, unspecified: Secondary | ICD-10-CM

## 2017-02-03 DIAGNOSIS — Z794 Long term (current) use of insulin: Secondary | ICD-10-CM

## 2017-02-03 DIAGNOSIS — E785 Hyperlipidemia, unspecified: Secondary | ICD-10-CM | POA: Diagnosis not present

## 2017-02-03 DIAGNOSIS — E1140 Type 2 diabetes mellitus with diabetic neuropathy, unspecified: Secondary | ICD-10-CM | POA: Diagnosis not present

## 2017-02-03 NOTE — Progress Notes (Signed)
Patient ID: Lynn Hobbs, female   DOB: 1960-05-18, 56 y.o.   MRN: [id number]     Primary M.D. : Dr. Bluford Kaufmann  HPI: Lynn Hobbs is a 56 y.o. female who presents to the office office for a 8 month cardiology evaluation.  Lynn Hobbs has a history of diabetes mellitus with neurologic complications,a history of prior CVA, hyperlipidemia, and peripheral neuropathy. She developed chest pain leading to her hospitalization on 03/23/2013. ECG after arrival to the hospital in the early morning showed ST elevation anteriorly. Troponin was positive for 0.86 and urgent catheterization  performed by me demonstrated a 99% stenosis in the LAD  proximal to the diagonal vessel with initial TIMI 1/2 flow. She had a diffusely diseased distal LAD with 50-60% stenosis, and 40-50% narrowings in the first diagonal vessel. The circumflex vessel had mild irregularities. The RCA was diffusely diseased with 40% near ostial narrowing, diffuse 70-80% mid stenoses, 80% stenosis before the acute margin, and 60% distal stenosis.  She underwent insertion of a 3.0 x18 mm Xience Alpine stent postdilated 3.25 mm  in her mid LAD.  She was discharged on 03/27/2013. She was sent home with a life-vest.   During the hospital her LDL cholesterol was 184. Hemoglobin A1c was 8.1. A 2-D echo Doppler study pre-discharge following her initial event showed an ejection fraction of 30-35% and for this reason a life vest was initially recommended with plans for a followup echo in 3 months.  A followup echo Doppler study on June 14, 2013 showed improved LV function with ejection fraction increasing to 45-50%.  She did have left ventricular hypertrophy.  There was akinesis of the apical myocardium.  Her LifeVest was discontinued.  Followup blood work on May 25, 2013 showed markedly improved lipid status on her atorvastatin 80 mg with her total cholesterol improving from 259 to150 and your LDL cholesterol from 184 to 86.  HDL was 50,  triglycerides 68.  She underwent a nuclear perfusion study 04/22/2016.  This showed an ejection fraction of 55%.  There was a defect that was nonreversible and consistent with prior infarct in the anteroseptal mid inferior, apical anterior, apical septal, apical inferior and apical lateral wall.  There was no ischemia.    Since I last saw her in March 2018.  She has continued to do well and denies any recurrent chest pain, shortness of breath, or palpitations.  She has been on amlodipine 10 mg, carvedilol 37.5 mg twice a day, chlorthalidone 25 mg every other day, and losartan 100 mg daily for hypertension.  She is on insulin for diabetes mellitus.  She continues to be on dual antiplatelet therapy with aspirin and Plavix.  She also takes over-the-counter vitamin D supplementation.  She presents for follow-up evaluation.    Past Medical History:  Diagnosis Date  . BENIGN NEOPLASM OF SKIN SITE UNSPECIFIED 09/08/2008  . CEREBROVASCULAR DISEASE 12/11/2008  . DIABETES MELLITUS, TYPE II 10/12/2006  . Endometriosis   . Fibroids   . HYPERLIPIDEMIA 10/12/2006  . HYPERTENSION 10/12/2006  . PERIPHERAL NEUROPATHY 10/21/2006  . Stroke [facility])     Past Surgical History:  Procedure Laterality Date  . ABDOMINAL HYSTERECTOMY    . LEFT HEART CATHETERIZATION WITH CORONARY ANGIOGRAM N/A 03/24/2013   Procedure: LEFT HEART CATHETERIZATION WITH CORONARY ANGIOGRAM;  Surgeon: Troy Sine, MD;  Location: [facility] CATH LAB;  Service: Cardiovascular;  Laterality: N/A;    Allergies  Allergen Reactions  . Tape Rash    Current Outpatient Medications  Medication  Sig Dispense Refill  . acetaminophen (TYLENOL) 650 MG CR tablet Take 1,300 mg by mouth daily as needed for pain.     Marland Kitchen amLODipine (NORVASC) 10 MG tablet Take 1 tablet (10 mg total) by mouth daily. 90 tablet 2  . aspirin EC 81 MG tablet Take 81 mg by mouth daily.    Marland Kitchen atorvastatin (LIPITOR) 80 MG tablet Take 1 tablet (80 mg total) by mouth at bedtime. 90 tablet 3  .  carvedilol (COREG) 25 MG tablet Take 1.5 tablets (37.5 mg total) by mouth 2 (two) times daily. 270 tablet 0  . chlorthalidone (HYGROTON) 25 MG tablet Take 1 tablet (25 mg total) by mouth every other day. 90 tablet 3  . Cholecalciferol (VITAMIN D-3) 5000 units TABS Take 1 tablet by mouth daily.    . clopidogrel (PLAVIX) 75 MG tablet Take 1 tablet (75 mg total) by mouth daily. 90 tablet 3  . diphenoxylate-atropine (LOMOTIL) 2.5-0.025 MG tablet Take 1 tablet by mouth 4 (four) times daily as needed for diarrhea or loose stools. 30 tablet 0  . insulin aspart (NOVOLOG) 100 UNIT/ML FlexPen Inject 26 Units into the skin 2 (two) times daily before a meal. (lunch and dinner) 4 pen 11  . Insulin Detemir (LEVEMIR FLEXTOUCH) 100 UNIT/ML Pen Inject 36 Units into the skin at bedtime. 45 mL 2  . Insulin Pen Needle (PEN NEEDLES) 32G X 4 MM MISC 1 applicator by Does not apply route 4 (four) times daily. Dx E11.9  ([place] Pen needles) 100 each 6  . losartan (COZAAR) 100 MG tablet Take 1 tablet (100 mg total) by mouth daily. 90 tablet 3  . Menthol, Topical Analgesic, (BLUE-EMU MAXIMUM STRENGTH) 2.5 % LIQD Apply 1 spray topically as needed (pain).    Marland Kitchen NITROSTAT 0.4 MG SL tablet PLACE ONE TABLET UNDER THE TONGUE EVERY 5 MINUTES FOR 3 DOSES AS NEEDED FOR CHEST PAIN 20 tablet 2  . potassium chloride SA (KLOR-CON M20) 20 MEQ tablet Take 1 tablet (20 mEq total) by mouth daily. 90 tablet 3  . prednisoLONE acetate (PRED FORTE) 1 % ophthalmic suspension Place 1 drop into the left eye 4 (four) times daily.     No current facility-administered medications for this visit.     Social History   Socioeconomic History  . Marital status: Single    Spouse name: Not on file  . Number of children: Not on file  . Years of education: Not on file  . Highest education level: Not on file  Social Needs  . Financial resource strain: Not on file  . Food insecurity - worry: Not on file  . Food insecurity - inability: Not on file  .  Transportation needs - medical: Not on file  . Transportation needs - non-medical: Not on file  Occupational History  . Not on file  Tobacco Use  . Smoking status: Never Smoker  . Smokeless tobacco: Never Used  Substance and Sexual Activity  . Alcohol use: No  . Drug use: No  . Sexual activity: Not on file  Other Topics Concern  . Not on file  Social History Narrative   Regular exercise: no   Caffeine use: ocassionally    No family history on file.  ROS General: Negative; No fevers, chills, or night sweats;  HEENT: Negative; No changes in vision or hearing, sinus congestion, difficulty swallowing Pulmonary: Negative; No cough, wheezing, shortness of breath, hemoptysis Cardiovascular: See history of present illness; No chest pain, presyncope, syncope, palpitations GI: Negative; No  nausea, vomiting, diarrhea, or abdominal pain GU: Negative; No dysuria, hematuria, or difficulty voiding Musculoskeletal: Positive for leg spasms. Hematologic/Oncology: Negative; no easy bruising, bleeding Endocrine: Positive for diabetes; no heat/cold intolerance; Neuro: Negative; no changes in balance, headaches Skin: Negative; No rashes or skin lesions Psychiatric: Negative; No behavioral problems, depression Sleep: Negative; No snoring, daytime sleepiness, hypersomnolence, bruxism, restless legs, hypnogognic hallucinations, no cataplexy Other comprehensive 14 point system review is negative.  PE BP (!) 166/80   Pulse 77   Ht '5\' 6"'  (1.676 m)   Wt 227 lb (103 kg)   BMI 36.64 kg/m    Repeat by me was 154/82  Wt Readings from Last 3 Encounters:  02/03/17 227 lb (103 kg)  11/19/16 223 lb 12.8 oz (101.5 kg)  11/13/16 228 lb (103.4 kg)   General: Alert, oriented, no distress.  Skin: normal turgor, no rashes, warm and dry HEENT: Normocephalic, atraumatic. Pupils equal round and reactive to light; sclera anicteric; extraocular muscles intact;  Nose without nasal septal  hypertrophy Mouth/Parynx benign; Mallinpatti scale 3 Neck: No JVD, no carotid bruits; normal carotid upstroke Lungs: clear to ausculatation and percussion; no wheezing or rales Chest wall: without tenderness to palpitation Heart: PMI not displaced, RRR, s1 s2 normal, 1/6 systolic murmur, no diastolic murmur, no rubs, gallops, thrills, or heaves Abdomen: soft, nontender; no hepatosplenomehaly, BS+; abdominal aorta nontender and not dilated by palpation. Back: no CVA tenderness Pulses 2+ Musculoskeletal: full range of motion, normal strength, no joint deformities Extremities: no clubbing cyanosis or edema, Homan's sign negative  Neurologic: grossly nonfocal; Cranial nerves grossly wnl Psychologic: Normal mood and affect   ECG (independently read by me): Normal sinus rhythm at 77 bpm.  Small inferior Q waves.  Q waves V3 through V6 concordant with old anterolateral infarct.  June 2017 ECG (independently read by me):  Normal sinus rhythm at 77 bpm. Q waves in lead 3 and aVF.  Q waves V3 through V6 concordant with anterolateral infarct.  No ST segment changes.  Intervals normal.  ECG (independently read by me): Normal sinus rhythm at 73 bpm.  Evidence for prior anterolateral MI.  Left axis deviation.  Inferior Q waves in 3 and aVF.  February 2016 ECG (independently read by me): Normal sinus rhythm at 79 bpm.  Inferior Q waves in lead III and F.  Poor anterior R-wave progression.  August 2015 ECG (independently read by me): Sinus rhythm at 73 beats per minute.  Probably a progression anteriorly, concordant with her prior MI. QTc interval 449 ms  06/30/2013 ECG today (independently read by me): Sinus rhythm at 79 beats per minute.  Poor with progression anteriorly.  Prior ECG (independently read by me): Normal sinus rhythm at 86 beats per minute anterior Q waves with her MI. QTc interval 459 ms   LABS:  BMP Latest Ref Rng & Units 11/13/2016 11/13/2016 01/18/2016  Glucose 65 - 99 mg/dL 197(H)  191(H) 219(H)  BUN 6 - 20 mg/dL '16 14 24  ' Creatinine 0.44 - 1.00 mg/dL 0.80 0.92 1.13(H)  Sodium 135 - 145 mmol/L 141 139 139  Potassium 3.5 - 5.1 mmol/L 3.7 3.7 4.6  Chloride 101 - 111 mmol/L 105 106 101  CO2 22 - 32 mmol/L - 22 27  Calcium 8.9 - 10.3 mg/dL - 9.4 9.8    Hepatic Function Latest Ref Rng & Units 11/13/2016 01/18/2016 09/12/2015  Total Protein 6.5 - 8.1 g/dL 8.0 8.0 7.7  Albumin 3.5 - 5.0 g/dL 4.0 4.3 4.4  AST 15 -  41 U/L '23 16 14  ' ALT 14 - 54 U/L 33 12 16  Alk Phosphatase 38 - 126 U/L 41 52 52  Total Bilirubin 0.3 - 1.2 mg/dL 0.8 0.5 0.5  Bilirubin, Direct 0.0 - 0.3 mg/dL - - -    CBC Latest Ref Rng & Units 11/13/2016 11/13/2016 01/18/2016  WBC 4.0 - 10.5 K/uL - 7.3 6.9  Hemoglobin 12.0 - 15.0 g/dL 12.6 11.6(L) 11.2(L)  Hematocrit 36.0 - 46.0 % 37.0 36.0 35.4  Platelets 150 - 400 K/uL - 190 227    No results found for: PROBNP  Lipid Panel     Component Value Date/Time   CHOL 148 09/12/2015 0944   TRIG 103 09/12/2015 0944   HDL 58 09/12/2015 0944   CHOLHDL 2.6 09/12/2015 0944   VLDL 21 09/12/2015 0944   LDLCALC 69 09/12/2015 0944     RADIOLOGY: No results found.  IMPRESSION:  1. Hyperlipidemia with target LDL less than 70   2. CAD in native artery   3. Old MI (myocardial infarction); anterior STEMI 03/24/2013   4. Essential hypertension   5. Other fatigue   6. Obesity (BMI 30-39.9)   7. Type 2 diabetes mellitus with diabetic neuropathy, with long-term current use of insulin ([place])     ASSESSMENT AND PLAN: Lynn Hobbs is a 56year-old African-American female who suffered an ST segment elevation myocardial infarction and presented urgently to the catheterization laboratory in the morning of 03/24/2013. She was found to have subtotal 99% LAD stenosis which was successfully stented with a DES stent.   She initially had an ischemic cardiomyopathy and required an initial LifeVest, but with significant improvement in LV function .  The LifeVest was  subsequently discontinued.  She has continued to be stable without recurrent anginal symptoms.  She denies any change in shortness of breath.  She denies any palpitations or arrhythmia.  Her blood pressure today is elevated on her current regimen consisting of amlodipine 10 mg, carvedilol 37.5 mg twice a day, losartan 100 mg daily, and she has been taking chlorthalidone 25 mg every other day.  In the past, she was found to have  mild creatinine increase to maximum of 1.46 consistent with stage III chronic kidney disease.  Her dose was reduced.  Renal function September 2018 was 0.80.  She continues to be on atorvastatin 80 mg for hyperlipidemia with target LDL less than 70.  She has continued to be on dual antiplatelet therapy and has been without bleeding.  She is on insulin for diabetes mellitus.  With her blood pressure elevation, I have suggested that she try alternating chlorthalidone and take 25 mg alternating with 12.5 mg every other day.  However, if she has difficulty breaking the pill.  She will take chlorthalidone 25 mg 2 out of every 3 days.  She is already on 37.5 mg twice a day of carvedilol and at present will not increase this dose.  I have recommended follow-up laboratory be obtained in 6-8 weeks.  BMI today is increased at 36.64.  Weight reduction and increased exercise was recommended.  I will contact her regarding the results and adjustments to her medical regimen will be made if necessary.  As long as she remains stable, I will see her in 6 months for reevaluation.    Time spent: 25 minutes  Troy Sine, MD, [facility]  02/03/2017 1:41 PM

## 2017-02-03 NOTE — Patient Instructions (Addendum)
Medication Instructions:  DECREASE- Chlorthalidone 25 mg(1 tablet) alternating 12.5 mg (1/2 tablets) If medication can not be cut in half take medication 2 out of three days.  If you need a refill on your cardiac medications before your next appointment, please call your pharmacy.  Labwork: CBC, BMP, TSH, Fasting Lipids in 2 months  Testing/Procedures: None Ordered  Follow-Up: Your physician wants you to follow-up in: 6 Months. You should receive a reminder letter in the mail two months in advance. If you do not receive a letter, please call our office [phone].    Thank you for choosing [facility]!!

## 2017-02-10 ENCOUNTER — Other Ambulatory Visit: Payer: Self-pay | Admitting: Internal Medicine

## 2017-02-16 ENCOUNTER — Ambulatory Visit: Payer: Medicare Other | Admitting: Internal Medicine

## 2017-03-16 ENCOUNTER — Ambulatory Visit (INDEPENDENT_AMBULATORY_CARE_PROVIDER_SITE_OTHER): Payer: Medicare Other | Admitting: Internal Medicine

## 2017-03-16 ENCOUNTER — Encounter: Payer: Self-pay | Admitting: Internal Medicine

## 2017-03-16 VITALS — BP 162/70 | HR 79 | Temp 98.0°F | Ht 66.0 in | Wt 226.2 lb

## 2017-03-16 DIAGNOSIS — I10 Essential (primary) hypertension: Secondary | ICD-10-CM

## 2017-03-16 DIAGNOSIS — N183 Chronic kidney disease, stage 3 unspecified: Secondary | ICD-10-CM

## 2017-03-16 DIAGNOSIS — E119 Type 2 diabetes mellitus without complications: Secondary | ICD-10-CM

## 2017-03-16 DIAGNOSIS — E1140 Type 2 diabetes mellitus with diabetic neuropathy, unspecified: Secondary | ICD-10-CM

## 2017-03-16 DIAGNOSIS — Z794 Long term (current) use of insulin: Secondary | ICD-10-CM

## 2017-03-16 DIAGNOSIS — E785 Hyperlipidemia, unspecified: Secondary | ICD-10-CM | POA: Diagnosis not present

## 2017-03-16 MED ORDER — CARVEDILOL 25 MG PO TABS: 50.0000 mg | ORAL_TABLET | Freq: Two times a day (BID) | ORAL | 0 refills | Status: DC

## 2017-03-16 MED ORDER — INSULIN DETEMIR 100 UNIT/ML FLEXPEN: 40.0000 [IU] | PEN_INJECTOR | Freq: Every day | SUBCUTANEOUS | 2 refills | Status: DC

## 2017-03-16 NOTE — Patient Instructions (Signed)
Follow-up endocrinology Cardiology follow-up as scheduled  Limit your sodium (Salt) intake  Increase Levemir to 40 units at bedtime

## 2017-03-16 NOTE — Progress Notes (Signed)
Subjective:    Patient ID: Lynn Hobbs, female    DOB: 1960/09/26, 57 y.o.   MRN: [id number]  HPI Lab Results  Component Value Date   HGBA1C 9.1 (H) 08/18/2016   57 year old patient who has a history of cerebrovascular and coronary artery disease. She is followed by cardiology. She has multi-drug-resistant hypertension. She has been followed by endocrinology in the past but not recently.  Fasting blood sugars are generally in the 120s.  She states her highest fasting blood sugar was 150.  Blood sugars were a bit more labile throughout the day and have been as low as 80. She has been compliant with her medications.  No cardiopulmonary complaints.  Her main complaint today is right knee pain.  Past Medical History:  Diagnosis Date  . BENIGN NEOPLASM OF SKIN SITE UNSPECIFIED 09/08/2008  . CEREBROVASCULAR DISEASE 12/11/2008  . DIABETES MELLITUS, TYPE II 10/12/2006  . Endometriosis   . Fibroids   . HYPERLIPIDEMIA 10/12/2006  . HYPERTENSION 10/12/2006  . PERIPHERAL NEUROPATHY 10/21/2006  . Stroke [facility]      Social History   Socioeconomic History  . Marital status: Single    Spouse name: Not on file  . Number of children: Not on file  . Years of education: Not on file  . Highest education level: Not on file  Social Needs  . Financial resource strain: Not on file  . Food insecurity - worry: Not on file  . Food insecurity - inability: Not on file  . Transportation needs - medical: Not on file  . Transportation needs - non-medical: Not on file  Occupational History  . Not on file  Tobacco Use  . Smoking status: Never Smoker  . Smokeless tobacco: Never Used  Substance and Sexual Activity  . Alcohol use: No  . Drug use: No  . Sexual activity: Not on file  Other Topics Concern  . Not on file  Social History Narrative   Regular exercise: no   Caffeine use: ocassionally    Past Surgical History:  Procedure Laterality Date  . ABDOMINAL HYSTERECTOMY    . LEFT HEART  CATHETERIZATION WITH CORONARY ANGIOGRAM N/A 03/24/2013   Procedure: LEFT HEART CATHETERIZATION WITH CORONARY ANGIOGRAM;  Surgeon: Troy Sine, MD;  Location: [facility] CATH LAB;  Service: Cardiovascular;  Laterality: N/A;    History reviewed. No pertinent family history.  Allergies  Allergen Reactions  . Tape Rash    Current Outpatient Medications on File Prior to Visit  Medication Sig Dispense Refill  . acetaminophen (TYLENOL) 650 MG CR tablet Take 1,300 mg by mouth daily as needed for pain.     Marland Kitchen amLODipine (NORVASC) 10 MG tablet Take 1 tablet (10 mg total) by mouth daily. 90 tablet 2  . aspirin EC 81 MG tablet Take 81 mg by mouth daily.    Marland Kitchen atorvastatin (LIPITOR) 80 MG tablet Take 1 tablet (80 mg total) by mouth at bedtime. 90 tablet 3  . carvedilol (COREG) 25 MG tablet Take 1.5 tablets (37.5 mg total) by mouth 2 (two) times daily. 270 tablet 0  . chlorthalidone (HYGROTON) 25 MG tablet Take 1 tablet (25 mg total) by mouth every other day. 90 tablet 3  . chlorthalidone (HYGROTON) 25 MG tablet TAKE ONE TABLET BY MOUTH ONCE DAILY 90 tablet 1  . Cholecalciferol (VITAMIN D-3) 5000 units TABS Take 1 tablet by mouth daily.    . clopidogrel (PLAVIX) 75 MG tablet Take 1 tablet (75 mg total) by mouth daily. 90 tablet  3  . diphenoxylate-atropine (LOMOTIL) 2.5-0.025 MG tablet Take 1 tablet by mouth 4 (four) times daily as needed for diarrhea or loose stools. 30 tablet 0  . insulin aspart (NOVOLOG) 100 UNIT/ML FlexPen Inject 26 Units into the skin 2 (two) times daily before a meal. (lunch and dinner) 4 pen 11  . Insulin Detemir (LEVEMIR FLEXTOUCH) 100 UNIT/ML Pen Inject 36 Units into the skin at bedtime. 45 mL 2  . Insulin Pen Needle (PEN NEEDLES) 32G X 4 MM MISC 1 applicator by Does not apply route 4 (four) times daily. Dx E11.9  ([place] Pen needles) 100 each 6  . losartan (COZAAR) 100 MG tablet Take 1 tablet (100 mg total) by mouth daily. 90 tablet 3  . Menthol, Topical Analgesic, (BLUE-EMU MAXIMUM  STRENGTH) 2.5 % LIQD Apply 1 spray topically as needed (pain).    Marland Kitchen NITROSTAT 0.4 MG SL tablet PLACE ONE TABLET UNDER THE TONGUE EVERY 5 MINUTES FOR 3 DOSES AS NEEDED FOR CHEST PAIN 20 tablet 2  . potassium chloride SA (KLOR-CON M20) 20 MEQ tablet Take 1 tablet (20 mEq total) by mouth daily. 90 tablet 3  . prednisoLONE acetate (PRED FORTE) 1 % ophthalmic suspension Place 1 drop into the left eye 4 (four) times daily.     No current facility-administered medications on file prior to visit.     BP (!) 162/70 (BP Location: Left Arm, Patient Position: Sitting, Cuff Size: Normal)   Pulse 79   Temp 98 F (36.7 C) (Oral)   Ht 5\' 6"  (1.676 m)   Wt 226 lb 3.2 oz (102.6 kg)   SpO2 98%   BMI 36.51 kg/m     Review of Systems  Constitutional: Negative.  Negative for appetite change, fatigue, fever and unexpected weight change.  HENT: Negative for congestion, dental problem, ear pain, hearing loss, mouth sores, nosebleeds, rhinorrhea, sinus pressure, sore throat, tinnitus, trouble swallowing and voice change.   Eyes: Negative for photophobia, pain, discharge, redness and visual disturbance.  Respiratory: Negative for cough, chest tightness and shortness of breath.   Cardiovascular: Negative for chest pain, palpitations and leg swelling.  Gastrointestinal: Negative for abdominal distention, abdominal pain, blood in stool, constipation, diarrhea, nausea, rectal pain and vomiting.  Genitourinary: Negative for difficulty urinating, dysuria, flank pain, frequency, genital sores, hematuria, menstrual problem, pelvic pain, urgency, vaginal bleeding, vaginal discharge and vaginal pain.  Musculoskeletal: Positive for arthralgias and gait problem. Negative for back pain, joint swelling and neck stiffness.  Skin: Negative for rash.  Neurological: Negative for dizziness, syncope, speech difficulty, weakness, light-headedness, numbness and headaches.  Hematological: Negative for adenopathy. Does not bruise/bleed  easily.  Psychiatric/Behavioral: Negative for agitation, behavioral problems, dysphoric mood, self-injury and suicidal ideas. The patient is not nervous/anxious.        Objective:   Physical Exam  Constitutional: She is oriented to person, place, and time. She appears well-developed and well-nourished.  Blood pressure 160/80 Pulse 80-90  HENT:  Head: Normocephalic.  Right Ear: External ear normal.  Left Ear: External ear normal.  Mouth/Throat: Oropharynx is clear and moist.  Eyes: Conjunctivae and EOM are normal. Pupils are equal, round, and reactive to light.  Neck: Normal range of motion. Neck supple. No thyromegaly present.  Cardiovascular: Normal rate, regular rhythm, normal heart sounds and intact distal pulses.  Pulmonary/Chest: Effort normal and breath sounds normal.  Abdominal: Soft. Bowel sounds are normal. She exhibits no mass. There is no tenderness.  Musculoskeletal: Normal range of motion.  Lymphadenopathy:    She  has no cervical adenopathy.  Neurological: She is alert and oriented to person, place, and time.  Left-sided weakness  Skin: Skin is warm and dry. No rash noted.  Psychiatric: She has a normal mood and affect. Her behavior is normal.          Assessment & Plan:   Diabetes mellitus.  Hemoglobin A1c today 8.0.  Will increase basal insulin to 40 units.  A follow-up endocrinology Essential hypertension.  Will increase Coreg to 50 mg 3 times daily. Cerebrovascular disease Coronary artery disease History of chronic kidney disease.  Most recent renal indices were normal  Follow-up 3 months  Roberto Hlavaty Pilar Plate

## 2017-04-06 DIAGNOSIS — I10 Essential (primary) hypertension: Secondary | ICD-10-CM | POA: Diagnosis not present

## 2017-04-06 DIAGNOSIS — E785 Hyperlipidemia, unspecified: Secondary | ICD-10-CM | POA: Diagnosis not present

## 2017-04-06 DIAGNOSIS — R5383 Other fatigue: Secondary | ICD-10-CM | POA: Diagnosis not present

## 2017-04-07 LAB — BASIC METABOLIC PANEL
BUN / CREAT RATIO: 23 (ref 9–23)
BUN: 27 mg/dL — AB (ref 6–24)
CHLORIDE: 99 mmol/L (ref 96–106)
CO2: 24 mmol/L (ref 20–29)
Calcium: 10.1 mg/dL (ref 8.7–10.2)
Creatinine, Ser: 1.2 mg/dL — ABNORMAL HIGH (ref 0.57–1.00)
GFR calc Af Amer: 58 mL/min/{1.73_m2} — ABNORMAL LOW (ref >59)
GFR calc non Af Amer: 51 mL/min/{1.73_m2} — ABNORMAL LOW (ref >59)
Glucose: 170 mg/dL — ABNORMAL HIGH (ref 65–99)
Potassium: 4.1 mmol/L (ref 3.5–5.2)
Sodium: 143 mmol/L (ref 134–144)

## 2017-04-07 LAB — CBC
Hematocrit: 35.1 % (ref 34.0–46.6)
Hemoglobin: 11.7 g/dL (ref 11.1–15.9)
MCH: 25.2 pg — AB (ref 26.6–33.0)
MCHC: 33.3 g/dL (ref 31.5–35.7)
MCV: 76 fL — ABNORMAL LOW (ref 79–97)
PLATELETS: 221 10*3/uL (ref 150–379)
RBC: 4.65 x10E6/uL (ref 3.77–5.28)
RDW: 15.3 % (ref 12.3–15.4)
WBC: 7.6 10*3/uL (ref 3.4–10.8)

## 2017-04-07 LAB — LIPID PANEL
CHOL/HDL RATIO: 3.6 ratio (ref 0.0–4.4)
Cholesterol, Total: 231 mg/dL — ABNORMAL HIGH (ref 100–199)
HDL: 65 mg/dL (ref >39)
LDL Calculated: 147 mg/dL — ABNORMAL HIGH (ref 0–99)
Triglycerides: 93 mg/dL (ref 0–149)
VLDL CHOLESTEROL CAL: 19 mg/dL (ref 5–40)

## 2017-04-07 LAB — TSH: TSH: 2.65 u[IU]/mL (ref 0.450–4.500)

## 2017-04-17 ENCOUNTER — Telehealth: Payer: Self-pay | Admitting: *Deleted

## 2017-04-17 ENCOUNTER — Other Ambulatory Visit: Payer: Self-pay | Admitting: *Deleted

## 2017-04-17 DIAGNOSIS — R718 Other abnormality of red blood cells: Secondary | ICD-10-CM

## 2017-04-17 DIAGNOSIS — Z79899 Other long term (current) drug therapy: Secondary | ICD-10-CM

## 2017-04-17 DIAGNOSIS — E785 Hyperlipidemia, unspecified: Secondary | ICD-10-CM

## 2017-04-17 NOTE — Telephone Encounter (Signed)
-----   Message from Troy Sine, MD sent at 04/07/2017  6:08 PM EST ----- Microcytic indices, check iron studies/ferritin; , glucose increased.  Creatinine slightly increased from 4 months ago, but improved from previously. ? Is patient still taking lipid lowering therapy with atorvastatin 80 mg.  Lipid studies are significant increased from one year ago. If not taking, consider a trial of rosuvastatin 20 mg.

## 2017-04-17 NOTE — Telephone Encounter (Signed)
Spoke to patient, patient aware and verbalized understanding.   Lab order placed.   Patient reports she is currently taking atorvastatin 80mg  daily.  Will route to Dr. Claiborne Billings to advise on further medication changes/recommendations.

## 2017-04-20 ENCOUNTER — Other Ambulatory Visit: Payer: Self-pay | Admitting: *Deleted

## 2017-04-20 DIAGNOSIS — R718 Other abnormality of red blood cells: Secondary | ICD-10-CM

## 2017-04-20 DIAGNOSIS — Z862 Personal history of diseases of the blood and blood-forming organs and certain disorders involving the immune mechanism: Secondary | ICD-10-CM

## 2017-04-20 LAB — IRON,TIBC AND FERRITIN PANEL
FERRITIN: 91 ng/mL (ref 15–150)
Iron Saturation: 21 % (ref 15–55)
Iron: 66 ug/dL (ref 27–159)
TIBC: 315 ug/dL (ref 250–450)
UIBC: 249 ug/dL (ref 131–425)

## 2017-04-20 NOTE — Telephone Encounter (Signed)
Will add Zetia 10 mg to current dose of statin; recheck lipids in 2 months

## 2017-04-21 MED ORDER — EZETIMIBE 10 MG PO TABS: 10.0000 mg | ORAL_TABLET | Freq: Every day | ORAL | 3 refills | Status: DC

## 2017-04-21 NOTE — Telephone Encounter (Signed)
Patient aware and verbalized understanding.   rx sent to pharmacy.  Lab orders mailed to patient.

## 2017-04-22 ENCOUNTER — Encounter (INDEPENDENT_AMBULATORY_CARE_PROVIDER_SITE_OTHER): Payer: Medicare Other | Admitting: Ophthalmology

## 2017-04-22 ENCOUNTER — Encounter: Payer: Self-pay | Admitting: Internal Medicine

## 2017-04-22 DIAGNOSIS — H43813 Vitreous degeneration, bilateral: Secondary | ICD-10-CM

## 2017-04-22 DIAGNOSIS — E113591 Type 2 diabetes mellitus with proliferative diabetic retinopathy without macular edema, right eye: Secondary | ICD-10-CM

## 2017-04-22 DIAGNOSIS — E113512 Type 2 diabetes mellitus with proliferative diabetic retinopathy with macular edema, left eye: Secondary | ICD-10-CM

## 2017-04-22 DIAGNOSIS — E11311 Type 2 diabetes mellitus with unspecified diabetic retinopathy with macular edema: Secondary | ICD-10-CM | POA: Diagnosis not present

## 2017-04-22 DIAGNOSIS — H35033 Hypertensive retinopathy, bilateral: Secondary | ICD-10-CM | POA: Diagnosis not present

## 2017-04-22 DIAGNOSIS — I10 Essential (primary) hypertension: Secondary | ICD-10-CM | POA: Diagnosis not present

## 2017-04-22 LAB — HM DIABETES EYE EXAM

## 2017-06-15 ENCOUNTER — Ambulatory Visit (INDEPENDENT_AMBULATORY_CARE_PROVIDER_SITE_OTHER): Payer: Medicare Other | Admitting: Internal Medicine

## 2017-06-15 ENCOUNTER — Encounter: Payer: Self-pay | Admitting: Internal Medicine

## 2017-06-15 VITALS — BP 106/68 | HR 72 | Temp 98.5°F | Wt 219.0 lb

## 2017-06-15 DIAGNOSIS — E785 Hyperlipidemia, unspecified: Secondary | ICD-10-CM | POA: Diagnosis not present

## 2017-06-15 DIAGNOSIS — N182 Chronic kidney disease, stage 2 (mild): Secondary | ICD-10-CM | POA: Diagnosis not present

## 2017-06-15 DIAGNOSIS — E1140 Type 2 diabetes mellitus with diabetic neuropathy, unspecified: Secondary | ICD-10-CM

## 2017-06-15 DIAGNOSIS — Z794 Long term (current) use of insulin: Secondary | ICD-10-CM | POA: Diagnosis not present

## 2017-06-15 DIAGNOSIS — I10 Essential (primary) hypertension: Secondary | ICD-10-CM | POA: Diagnosis not present

## 2017-06-15 LAB — POCT GLYCOSYLATED HEMOGLOBIN (HGB A1C): Hemoglobin A1C: 9

## 2017-06-15 MED ORDER — INSULIN ASPART 100 UNIT/ML FLEXPEN: 30.0000 [IU] | PEN_INJECTOR | Freq: Two times a day (BID) | SUBCUTANEOUS | 11 refills | Status: DC

## 2017-06-15 MED ORDER — INSULIN DETEMIR 100 UNIT/ML ~~LOC~~ SOLN: 46.0000 [IU] | Freq: Every day | SUBCUTANEOUS | 11 refills | Status: DC

## 2017-06-15 NOTE — Progress Notes (Signed)
Subjective:    Patient ID: Lynn Hobbs, female    DOB: 03-01-1961, 57 y.o.   MRN: [id number]  HPI  Lab Results  Component Value Date   HGBA1C 9.1 (H) 08/18/2016   57 year old patient who is seen today for follow-up.  She has diabetes and is followed closely by ophthalmology.  She has chronic kidney disease which is mild and also coronary artery disease.  She has cerebrovascular disease. She remains on Levemir 40 units at bedtime and 26 units of NovoLog prior to each meal. Last hemoglobin A1c 8.0 Denies any cardiopulmonary complaints or any focal neurological symptoms   Past Medical History:  Diagnosis Date  . BENIGN NEOPLASM OF SKIN SITE UNSPECIFIED 09/08/2008  . CEREBROVASCULAR DISEASE 12/11/2008  . DIABETES MELLITUS, TYPE II 10/12/2006  . Endometriosis   . Fibroids   . HYPERLIPIDEMIA 10/12/2006  . HYPERTENSION 10/12/2006  . PERIPHERAL NEUROPATHY 10/21/2006  . Stroke [facility])      Social History   Socioeconomic History  . Marital status: Single    Spouse name: Not on file  . Number of children: Not on file  . Years of education: Not on file  . Highest education level: Not on file  Occupational History  . Not on file  Social Needs  . Financial resource strain: Not on file  . Food insecurity:    Worry: Not on file    Inability: Not on file  . Transportation needs:    Medical: Not on file    Non-medical: Not on file  Tobacco Use  . Smoking status: Never Smoker  . Smokeless tobacco: Never Used  Substance and Sexual Activity  . Alcohol use: No  . Drug use: No  . Sexual activity: Not on file  Lifestyle  . Physical activity:    Days per week: Not on file    Minutes per session: Not on file  . Stress: Not on file  Relationships  . Social connections:    Talks on phone: Not on file    Gets together: Not on file    Attends religious service: Not on file    Active member of club or organization: Not on file    Attends meetings of clubs or organizations: Not on file   Relationship status: Not on file  . Intimate partner violence:    Fear of current or ex partner: Not on file    Emotionally abused: Not on file    Physically abused: Not on file    Forced sexual activity: Not on file  Other Topics Concern  . Not on file  Social History Narrative   Regular exercise: no   Caffeine use: ocassionally    Past Surgical History:  Procedure Laterality Date  . ABDOMINAL HYSTERECTOMY    . LEFT HEART CATHETERIZATION WITH CORONARY ANGIOGRAM N/A 03/24/2013   Procedure: LEFT HEART CATHETERIZATION WITH CORONARY ANGIOGRAM;  Surgeon: Troy Sine, MD;  Location: [facility] CATH LAB;  Service: Cardiovascular;  Laterality: N/A;    History reviewed. No pertinent family history.  Allergies  Allergen Reactions  . Tape Rash    Current Outpatient Medications on File Prior to Visit  Medication Sig Dispense Refill  . acetaminophen (TYLENOL) 650 MG CR tablet Take 1,300 mg by mouth daily as needed for pain.     Marland Kitchen amLODipine (NORVASC) 10 MG tablet Take 1 tablet (10 mg total) by mouth daily. 90 tablet 2  . aspirin EC 81 MG tablet Take 81 mg by mouth daily.    Marland Kitchen  atorvastatin (LIPITOR) 80 MG tablet Take 1 tablet (80 mg total) by mouth at bedtime. 90 tablet 3  . chlorthalidone (HYGROTON) 25 MG tablet TAKE ONE TABLET BY MOUTH ONCE DAILY (Patient taking differently: TAKE 1/2 TABLET BY MOUTH ONCE DAILY) 90 tablet 1  . Cholecalciferol (VITAMIN D-3) 5000 units TABS Take 1 tablet by mouth daily.    . clopidogrel (PLAVIX) 75 MG tablet Take 1 tablet (75 mg total) by mouth daily. 90 tablet 3  . diphenoxylate-atropine (LOMOTIL) 2.5-0.025 MG tablet Take 1 tablet by mouth 4 (four) times daily as needed for diarrhea or loose stools. 30 tablet 0  . ezetimibe (ZETIA) 10 MG tablet Take 1 tablet (10 mg total) by mouth daily. 90 tablet 3  . insulin aspart (NOVOLOG) 100 UNIT/ML FlexPen Inject 26 Units into the skin 2 (two) times daily before a meal. (lunch and dinner) 4 pen 11  . Insulin Pen Needle  (PEN NEEDLES) 32G X 4 MM MISC 1 applicator by Does not apply route 4 (four) times daily. Dx E11.9  ([place] Pen needles) 100 each 6  . losartan (COZAAR) 100 MG tablet Take 1 tablet (100 mg total) by mouth daily. 90 tablet 3  . Menthol, Topical Analgesic, (BLUE-EMU MAXIMUM STRENGTH) 2.5 % LIQD Apply 1 spray topically as needed (pain).    Marland Kitchen NITROSTAT 0.4 MG SL tablet PLACE ONE TABLET UNDER THE TONGUE EVERY 5 MINUTES FOR 3 DOSES AS NEEDED FOR CHEST PAIN 20 tablet 2  . potassium chloride SA (KLOR-CON M20) 20 MEQ tablet Take 1 tablet (20 mEq total) by mouth daily. 90 tablet 3  . prednisoLONE acetate (PRED FORTE) 1 % ophthalmic suspension Place 1 drop into the left eye 4 (four) times daily.     No current facility-administered medications on file prior to visit.     BP 106/68 (BP Location: Right Arm, Patient Position: Sitting, Cuff Size: Large)   Pulse 72   Temp 98.5 F (36.9 C) (Oral)   Wt 219 lb (99.3 kg)   SpO2 100%   BMI 35.35 kg/m      Review of Systems  Constitutional: Negative.   HENT: Negative for congestion, dental problem, hearing loss, rhinorrhea, sinus pressure, sore throat and tinnitus.   Eyes: Negative for pain, discharge and visual disturbance.  Respiratory: Positive for cough. Negative for shortness of breath and wheezing.   Cardiovascular: Negative for chest pain, palpitations and leg swelling.  Gastrointestinal: Negative for abdominal distention, abdominal pain, blood in stool, constipation, diarrhea, nausea and vomiting.  Genitourinary: Negative for difficulty urinating, dysuria, flank pain, frequency, hematuria, pelvic pain, urgency, vaginal bleeding, vaginal discharge and vaginal pain.  Musculoskeletal: Negative for arthralgias, gait problem and joint swelling.  Skin: Negative for rash.  Neurological: Positive for weakness. Negative for dizziness, syncope, speech difficulty, numbness and headaches.  Hematological: Negative for adenopathy.  Psychiatric/Behavioral:  Negative for agitation, behavioral problems and dysphoric mood. The patient is not nervous/anxious.        Objective:   Physical Exam  Constitutional: She is oriented to person, place, and time. She appears well-developed and well-nourished.  Blood pressure 110/70  HENT:  Head: Normocephalic.  Right Ear: External ear normal.  Left Ear: External ear normal.  Mouth/Throat: Oropharynx is clear and moist.  Eyes: Pupils are equal, round, and reactive to light. Conjunctivae and EOM are normal.  Neck: Normal range of motion. Neck supple. No thyromegaly present.  Cardiovascular: Normal rate, regular rhythm, normal heart sounds and intact distal pulses.  Pulmonary/Chest: Effort normal and breath sounds  normal. No respiratory distress. She has no wheezes. She has no rales.  Abdominal: Soft. Bowel sounds are normal. She exhibits no mass. There is no tenderness.  Musculoskeletal: Normal range of motion.  Lymphadenopathy:    She has no cervical adenopathy.  Neurological: She is alert and oriented to person, place, and time.  Skin: Skin is warm and dry. No rash noted.  Psychiatric: She has a normal mood and affect. Her behavior is normal.          Assessment & Plan:   Diabetes mellitus.  Hemoglobin A1c today 9.0.  continue basal bolus insulin therapy will uptitrate. Hypertension improved.  No change in therapy Dyslipidemia.  Continue Zetia  viral URI.  Will treat symptomatically   Follow-up 3 months  [id number],PETER Pilar Plate

## 2017-06-15 NOTE — Patient Instructions (Addendum)
Limit your sodium (Salt) intake  Acute bronchitis symptoms for less than 10 days are generally not helped by antibiotics.  Take over-the-counter expectorants and cough medications such as  Mucinex DM.  Call if there is no improvement in 5 to 7 days or if  you develop worsening cough, fever, or new symptoms, such as shortness of breath or chest pain.  Hydrate and Humidify  Drink enough water to keep your urine clear or pale yellow. Staying hydrated will help to thin your mucus.  Use a cool mist humidifier to keep the humidity level in your home above 50%.  Inhale steam for 10-15 minutes, 3-4 times a day or as told by your health care provider. You can do this in the bathroom while a hot shower is running.  Limit your exposure to cool or dry air. Rest  Rest as much as possible.      Please check your hemoglobin A1c every 3 months

## 2017-07-06 DIAGNOSIS — E785 Hyperlipidemia, unspecified: Secondary | ICD-10-CM | POA: Diagnosis not present

## 2017-07-06 DIAGNOSIS — Z79899 Other long term (current) drug therapy: Secondary | ICD-10-CM | POA: Diagnosis not present

## 2017-07-07 LAB — COMPREHENSIVE METABOLIC PANEL
A/G RATIO: 1.3 (ref 1.2–2.2)
ALBUMIN: 4.5 g/dL (ref 3.5–5.5)
ALT: 22 IU/L (ref 0–32)
AST: 17 IU/L (ref 0–40)
Alkaline Phosphatase: 62 IU/L (ref 39–117)
BILIRUBIN TOTAL: 0.4 mg/dL (ref 0.0–1.2)
BUN / CREAT RATIO: 19 (ref 9–23)
BUN: 25 mg/dL — AB (ref 6–24)
CHLORIDE: 101 mmol/L (ref 96–106)
CO2: 24 mmol/L (ref 20–29)
Calcium: 9.8 mg/dL (ref 8.7–10.2)
Creatinine, Ser: 1.34 mg/dL — ABNORMAL HIGH (ref 0.57–1.00)
GFR calc Af Amer: 51 mL/min/{1.73_m2} — ABNORMAL LOW (ref >59)
GFR calc non Af Amer: 44 mL/min/{1.73_m2} — ABNORMAL LOW (ref >59)
GLUCOSE: 246 mg/dL — AB (ref 65–99)
Globulin, Total: 3.5 g/dL (ref 1.5–4.5)
POTASSIUM: 4.4 mmol/L (ref 3.5–5.2)
Sodium: 140 mmol/L (ref 134–144)
Total Protein: 8 g/dL (ref 6.0–8.5)

## 2017-07-07 LAB — LIPID PANEL
Chol/HDL Ratio: 3.5 ratio (ref 0.0–4.4)
Cholesterol, Total: 200 mg/dL — ABNORMAL HIGH (ref 100–199)
HDL: 57 mg/dL (ref >39)
LDL Calculated: 122 mg/dL — ABNORMAL HIGH (ref 0–99)
Triglycerides: 105 mg/dL (ref 0–149)
VLDL CHOLESTEROL CAL: 21 mg/dL (ref 5–40)

## 2017-07-13 ENCOUNTER — Other Ambulatory Visit: Payer: Self-pay | Admitting: Cardiovascular Disease

## 2017-07-13 NOTE — Telephone Encounter (Signed)
REFILL 

## 2017-07-21 ENCOUNTER — Ambulatory Visit (INDEPENDENT_AMBULATORY_CARE_PROVIDER_SITE_OTHER): Payer: Medicare Other | Admitting: Cardiovascular Disease

## 2017-07-21 ENCOUNTER — Encounter: Payer: Self-pay | Admitting: Cardiovascular Disease

## 2017-07-21 VITALS — BP 132/82 | HR 76 | Ht 66.0 in | Wt 222.8 lb

## 2017-07-21 DIAGNOSIS — E785 Hyperlipidemia, unspecified: Secondary | ICD-10-CM | POA: Diagnosis not present

## 2017-07-21 DIAGNOSIS — Z6835 Body mass index (BMI) 35.0-35.9, adult: Secondary | ICD-10-CM | POA: Diagnosis not present

## 2017-07-21 DIAGNOSIS — I10 Essential (primary) hypertension: Secondary | ICD-10-CM | POA: Diagnosis not present

## 2017-07-21 DIAGNOSIS — Z8673 Personal history of transient ischemic attack (TIA), and cerebral infarction without residual deficits: Secondary | ICD-10-CM | POA: Diagnosis not present

## 2017-07-21 DIAGNOSIS — Z79899 Other long term (current) drug therapy: Secondary | ICD-10-CM

## 2017-07-21 DIAGNOSIS — I2510 Atherosclerotic heart disease of native coronary artery without angina pectoris: Secondary | ICD-10-CM | POA: Diagnosis not present

## 2017-07-21 MED ORDER — CLOPIDOGREL BISULFATE 75 MG PO TABS: 75.0000 mg | ORAL_TABLET | Freq: Every day | ORAL | 3 refills | Status: DC

## 2017-07-21 MED ORDER — NITROGLYCERIN 0.4 MG SL SUBL: SUBLINGUAL_TABLET | SUBLINGUAL | 2 refills | Status: DC

## 2017-07-21 NOTE — Progress Notes (Signed)
Patient ID: Lynn Hobbs, female   DOB: 11/23/1960, 57 y.o.   MRN: [id number]     Primary M.D. : Dr. Bluford Kaufmann  HPI: Lynn Hobbs is a 57 y.o. female who presents to the office office for a 6 month cardiology evaluation.  Lynn Hobbs has a history of diabetes mellitus with neurologic complications,a history of prior CVA, hyperlipidemia, and peripheral neuropathy. She developed chest pain leading to her hospitalization on 03/23/2013. ECG after arrival to the hospital in the early morning showed ST elevation anteriorly. Troponin was positive for 0.86 and urgent catheterization  performed by me demonstrated a 99% stenosis in the LAD  proximal to the diagonal vessel with initial TIMI 1/2 flow. She had a diffusely diseased distal LAD with 50-60% stenosis, and 40-50% narrowings in the first diagonal vessel. The circumflex vessel had mild irregularities. The RCA was diffusely diseased with 40% near ostial narrowing, diffuse 70-80% mid stenoses, 80% stenosis before the acute margin, and 60% distal stenosis.  She underwent insertion of a 3.0 x18 mm Xience Alpine stent postdilated 3.25 mm  in her mid LAD.  She was discharged on 03/27/2013. She was sent home with a life-vest.   During the hospital her LDL cholesterol was 184. Hemoglobin A1c was 8.1. A 2-D echo Doppler study pre-discharge following her initial event showed an ejection fraction of 30-35% and for this reason a life vest was initially recommended with plans for a followup echo in 3 months.  A followup echo Doppler study on June 14, 2013 showed improved LV function with ejection fraction increasing to 45-50%.  She did have left ventricular hypertrophy.  There was akinesis of the apical myocardium.  Her LifeVest was discontinued.  Followup blood work on May 25, 2013 showed markedly improved lipid status on her atorvastatin 80 mg with her total cholesterol improving from 259 to150 and your LDL cholesterol from 184 to 86.  HDL was 50,  triglycerides 68.  She underwent a nuclear perfusion study 04/22/2016.  This showed an ejection fraction of 55%.  There was a defect that was nonreversible and consistent with prior infarct in the anteroseptal mid inferior, apical anterior, apical septal, apical inferior and apical lateral wall.  There was no ischemia.    I last saw her in November 2018.  At that time her blood pressure was elevated and I suggested that she try alternating chlorthalidone 25 mg with 12.5 mg every other day.  She was on amlodipine 10 mg, carvedilol 37.5 mg twice a day, losartan 100 mg daily.  Over the past 6 months, she denies any episodes of chest pain or palpitations.  Denies PND orthopnea.  Lab work on July 06, 2017 showed increased glucose of 246.  Creatinine was 1.34.  Her cholesterol was slightly improved with with the addition of Zetia 10 mg to atorvastatin 80 mg which was started in February 2019after her LDL cholesterol was 147.  She presents for reevaluation.   Past Medical History:  Diagnosis Date  . BENIGN NEOPLASM OF SKIN SITE UNSPECIFIED 09/08/2008  . CEREBROVASCULAR DISEASE 12/11/2008  . DIABETES MELLITUS, TYPE II 10/12/2006  . Endometriosis   . Fibroids   . HYPERLIPIDEMIA 10/12/2006  . HYPERTENSION 10/12/2006  . PERIPHERAL NEUROPATHY 10/21/2006  . Stroke [facility])     Past Surgical History:  Procedure Laterality Date  . ABDOMINAL HYSTERECTOMY    . LEFT HEART CATHETERIZATION WITH CORONARY ANGIOGRAM N/A 03/24/2013   Procedure: LEFT HEART CATHETERIZATION WITH CORONARY ANGIOGRAM;  Surgeon: Troy Sine, MD;  Location: [facility] CATH LAB;  Service: Cardiovascular;  Laterality: N/A;    Allergies  Allergen Reactions  . Tape Rash    Current Outpatient Medications  Medication Sig Dispense Refill  . acetaminophen (TYLENOL) 650 MG CR tablet Take 1,300 mg by mouth daily as needed for pain.     . amLODipine (NORVASC) 10 MG tablet Take 1 tablet (10 mg total) by mouth daily. 90 tablet 2  . aspirin EC 81 MG tablet  Take 81 mg by mouth daily.    . atorvastatin (LIPITOR) 80 MG tablet TAKE ONE TABLET BY MOUTH AT BEDTIME 90 tablet 0  . carvedilol (COREG) 25 MG tablet Take 50 mg by mouth 2 (two) times daily.    . chlorthalidone (HYGROTON) 25 MG tablet Take 12.5 mg by mouth daily.    . Cholecalciferol (VITAMIN D-3) 5000 units TABS Take 1 tablet by mouth daily.    . clopidogrel (PLAVIX) 75 MG tablet Take 1 tablet (75 mg total) by mouth daily. 90 tablet 3  . diphenoxylate-atropine (LOMOTIL) 2.5-0.025 MG tablet Take 1 tablet by mouth 4 (four) times daily as needed for diarrhea or loose stools. 30 tablet 0  . ezetimibe (ZETIA) 10 MG tablet Take 1 tablet (10 mg total) by mouth daily. 90 tablet 3  . insulin aspart (NOVOLOG) 100 UNIT/ML FlexPen Inject 30 Units into the skin 2 (two) times daily before a meal. (lunch and dinner) 4 pen 11  . insulin detemir (LEVEMIR) 100 UNIT/ML injection Inject 0.46 mLs (46 Units total) into the skin at bedtime. 10 mL 11  . Insulin Pen Needle (PEN NEEDLES) 32G X 4 MM MISC 1 applicator by Does not apply route 4 (four) times daily. Dx E11.9  ([place] Pen needles) 100 each 6  . losartan (COZAAR) 100 MG tablet Take 1 tablet (100 mg total) by mouth daily. 90 tablet 3  . Menthol, Topical Analgesic, (BLUE-EMU MAXIMUM STRENGTH) 2.5 % LIQD Apply 1 spray topically as needed (pain).    . nitroGLYCERIN (NITROSTAT) 0.4 MG SL tablet PLACE ONE TABLET UNDER THE TONGUE EVERY 5 MINUTES FOR 3 DOSES AS NEEDED FOR CHEST PAIN 25 tablet 2  . potassium chloride SA (KLOR-CON M20) 20 MEQ tablet Take 1 tablet (20 mEq total) by mouth daily. 90 tablet 3   No current facility-administered medications for this visit.     Social History   Socioeconomic History  . Marital status: Single    Spouse name: Not on file  . Number of children: Not on file  . Years of education: Not on file  . Highest education level: Not on file  Occupational History  . Not on file  Social Needs  . Financial resource strain: Not on  file  . Food insecurity:    Worry: Not on file    Inability: Not on file  . Transportation needs:    Medical: Not on file    Non-medical: Not on file  Tobacco Use  . Smoking status: Never Smoker  . Smokeless tobacco: Never Used  Substance and Sexual Activity  . Alcohol use: No  . Drug use: No  . Sexual activity: Not on file  Lifestyle  . Physical activity:    Days per week: Not on file    Minutes per session: Not on file  . Stress: Not on file  Relationships  . Social connections:    Talks on phone: Not on file    Gets together: Not on file    Attends religious service: Not on file      Active member of club or organization: Not on file    Attends meetings of clubs or organizations: Not on file    Relationship status: Not on file  . Intimate partner violence:    Fear of current or ex partner: Not on file    Emotionally abused: Not on file    Physically abused: Not on file    Forced sexual activity: Not on file  Other Topics Concern  . Not on file  Social History Narrative   Regular exercise: no   Caffeine use: ocassionally    No family history on file.  ROS General: Negative; No fevers, chills, or night sweats;  HEENT: Negative; No changes in vision or hearing, sinus congestion, difficulty swallowing Pulmonary: Negative; No cough, wheezing, shortness of breath, hemoptysis Cardiovascular: See history of present illness; No chest pain, presyncope, syncope, palpitations GI: Negative; No nausea, vomiting, diarrhea, or abdominal pain GU: Negative; No dysuria, hematuria, or difficulty voiding Musculoskeletal: Positive for leg spasms. Hematologic/Oncology: Negative; no easy bruising, bleeding Endocrine: Positive for diabetes; no heat/cold intolerance; Neuro: Negative; no changes in balance, headaches Skin: Negative; No rashes or skin lesions Psychiatric: Negative; No behavioral problems, depression Sleep: Negative; No snoring, daytime sleepiness, hypersomnolence, bruxism,  restless legs, hypnogognic hallucinations, no cataplexy Other comprehensive 14 point system review is negative.  PE BP 132/82   Pulse 76   Ht 5' 6" (1.676 m)   Wt 222 lb 12.8 oz (101.1 kg)   BMI 35.96 kg/m   Repeat blood pressure today was 138/82.  Wt Readings from Last 3 Encounters:  07/21/17 222 lb 12.8 oz (101.1 kg)  06/15/17 219 lb (99.3 kg)  03/16/17 226 lb 3.2 oz (102.6 kg)   General: Alert, oriented, no distress.  Skin: normal turgor, no rashes, warm and dry HEENT: Normocephalic, atraumatic. Pupils equal round and reactive to light; sclera anicteric; extraocular muscles intact;  Nose without nasal septal hypertrophy Mouth/Parynx benign; Mallinpatti scale 3 Neck: No JVD, no carotid bruits; normal carotid upstroke Lungs: clear to ausculatation and percussion; no wheezing or rales Chest wall: without tenderness to palpitation Heart: PMI not displaced, RRR, s1 s2 normal, 1/6 systolic murmur, no diastolic murmur, no rubs, gallops, thrills, or heaves Abdomen: soft, nontender; no hepatosplenomehaly, BS+; abdominal aorta nontender and not dilated by palpation. Back: no CVA tenderness Pulses 2+ Musculoskeletal: full range of motion, normal strength, no joint deformities Extremities: no clubbing cyanosis or edema, Homan's sign negative  Neurologic: Cranial nerves grossly wnl Psychologic: Normal mood and affect  ECG (independently read by me): Normal sinus rhythm at 76 bpm.  Small inferior Q waves.  Poor anterior R wave progression.  QTc interval 468 ms.  November 2018 ECG (independently read by me): Normal sinus rhythm at 77 bpm.  Small inferior Q waves.  Q waves V3 through V6 concordant with old anterolateral infarct.  June 2017 ECG (independently read by me):  Normal sinus rhythm at 77 bpm. Q waves in lead 3 and aVF.  Q waves V3 through V6 concordant with anterolateral infarct.  No ST segment changes.  Intervals normal.  ECG (independently read by me): Normal sinus rhythm at 73  bpm.  Evidence for prior anterolateral MI.  Left axis deviation.  Inferior Q waves in 3 and aVF.  February 2016 ECG (independently read by me): Normal sinus rhythm at 79 bpm.  Inferior Q waves in lead III and F.  Poor anterior R-wave progression.  August 2015 ECG (independently read by me): Sinus rhythm at 73 beats per minute.  Probably a progression anteriorly, concordant with her prior MI. QTc interval 449 ms  06/30/2013 ECG today (independently read by me): Sinus rhythm at 79 beats per minute.  Poor with progression anteriorly.  Prior ECG (independently read by me): Normal sinus rhythm at 86 beats per minute anterior Q waves with her MI. QTc interval 459 ms   LABS:  BMP Latest Ref Rng & Units 07/06/2017 04/06/2017 11/13/2016  Glucose 65 - 99 mg/dL 246(H) 170(H) 197(H)  BUN 6 - 24 mg/dL 25(H) 27(H) 16  Creatinine 0.57 - 1.00 mg/dL 1.34(H) 1.20(H) 0.80  BUN/Creat Ratio 9 - _0 -  Sodium 134 - 144 mmol/L 140 143 141  Potassium 3.5 - 5.2 mmol/L 4.4 4.1 3.7  Chloride 96 - 106 mmol/L 101 99 105  CO2 20 - 29 mmol/L 24 24 -  Calcium 8.7 - 10.2 mg/dL 9.8 10.1 -    Hepatic Function Latest Ref Rng & Units 07/06/2017 11/13/2016 01/18/2016  Total Protein 6.0 - 8.5 g/dL 8.0 8.0 8.0  Albumin 3.5 - 5.5 g/dL 4.5 4.0 4.3  AST 0 - 40 IU/L _1 ALT 0 - 32 IU/L 22 33 12  Alk Phosphatase 39 - 117 IU/L 62 41 52  Total Bilirubin 0.0 - 1.2 mg/dL 0.4 0.8 0.5  Bilirubin, Direct 0.0 - 0.3 mg/dL - - -    CBC Latest Ref Rng & Units 04/06/2017 11/13/2016 11/13/2016  WBC 3.4 - 10.8 x10E3/uL 7.6 - 7.3  Hemoglobin 11.1 - 15.9 g/dL 11.7 12.6 11.6(L)  Hematocrit 34.0 - 46.6 % 35.1 37.0 36.0  Platelets 150 - 379 x10E3/uL 221 - 190    No results found for: PROBNP  Lipid Panel     Component Value Date/Time   CHOL 200 (H) 07/06/2017 1006   TRIG 105 07/06/2017 1006   HDL 57 07/06/2017 1006   CHOLHDL 3.5 07/06/2017 1006   CHOLHDL 2.6 09/12/2015 0944   VLDL 21 09/12/2015 0944   LDLCALC 122 (H) 07/06/2017  1006     RADIOLOGY: No results found.  IMPRESSION:  1. Hyperlipidemia with target LDL less than 70   2. Essential hypertension   3. CAD in native artery   4. Class 2 severe obesity due to excess calories with serious comorbidity and body mass index (BMI) of 35.0 to 35.9 in adult ([place])   5. Medication management   6. History of stroke     ASSESSMENT AND PLAN: Lynn Hobbs is a 57  year-old African-American female who suffered an ST segment elevation myocardial infarction and presented urgently to the catheterization laboratory in the morning of 03/24/2013. She was found to have subtotal 99% LAD stenosis which was successfully stented with a DES stent.   She initially had an ischemic cardiomyopathy and required an initial LifeVest, but with significant improvement in LV function .  The LifeVest was subsequently discontinued.  Since her acute coronary event she has been stable without recurrent anginal symptoms.  She has been treated for hypertension and her blood pressure today is improved at 132/82 on her regimen now consisting of amlodipine 10 mg, carvedilol 50 mg twice a day, chlorthalidone 12.5 mg, losartan 100 mg daily.  She has hyperlipidemia and in February following willing repeat laboratory Zetia 10 mg was added to atorvastatin 80 mg.  Repeat blood work on July 06, 2017 showed slight improvement with LDL cholesterol 122.  She is diabetic on insulin.  She continues to be on aspirin and Plavix for antiplatelet benefit.  She has  moderate obesity.  Weight loss and exercise was recommended.  She will undergo repeat laboratory in 3 months.  If her lipid studies are still elevated we will try to initiate the process for PCSK9 inhibition.  Time spent: 25 minutes.  Thomas A. Kelly, MD, FACC  07/23/2017 1:34 PM    

## 2017-07-21 NOTE — Patient Instructions (Signed)
Medication Instructions:  Your physician recommends that you continue on your current medications as directed. Please refer to the Current Medication list given to you today.  Labwork: Please return for FASTING labs in 3 months (CMET, Lipid)  Our in office lab hours are Monday-Friday 8:00-4:00, closed for lunch 12:45-1:45 pm.  No appointment needed.  Follow-Up: 3-4 months with Dr. Claiborne Billings    If you need a refill on your cardiac medications before your next appointment, please call your pharmacy.

## 2017-07-23 ENCOUNTER — Encounter: Payer: Self-pay | Admitting: Cardiovascular Disease

## 2017-08-23 ENCOUNTER — Other Ambulatory Visit: Payer: Self-pay | Admitting: Internal Medicine

## 2017-08-25 ENCOUNTER — Encounter: Payer: Self-pay | Admitting: Internal Medicine

## 2017-09-16 ENCOUNTER — Ambulatory Visit (INDEPENDENT_AMBULATORY_CARE_PROVIDER_SITE_OTHER): Payer: Medicare Other | Admitting: Internal Medicine

## 2017-09-16 ENCOUNTER — Encounter: Payer: Self-pay | Admitting: Internal Medicine

## 2017-09-16 VITALS — BP 120/64 | HR 76 | Temp 98.0°F | Wt 222.0 lb

## 2017-09-16 DIAGNOSIS — I10 Essential (primary) hypertension: Secondary | ICD-10-CM | POA: Diagnosis not present

## 2017-09-16 DIAGNOSIS — E1140 Type 2 diabetes mellitus with diabetic neuropathy, unspecified: Secondary | ICD-10-CM

## 2017-09-16 DIAGNOSIS — I679 Cerebrovascular disease, unspecified: Secondary | ICD-10-CM

## 2017-09-16 DIAGNOSIS — Z794 Long term (current) use of insulin: Secondary | ICD-10-CM

## 2017-09-16 DIAGNOSIS — I2510 Atherosclerotic heart disease of native coronary artery without angina pectoris: Secondary | ICD-10-CM | POA: Diagnosis not present

## 2017-09-16 LAB — POCT GLYCOSYLATED HEMOGLOBIN (HGB A1C): HEMOGLOBIN A1C: 7.9 % — AB (ref 4.0–5.6)

## 2017-09-16 MED ORDER — INSULIN ASPART 100 UNIT/ML FLEXPEN: PEN_INJECTOR | SUBCUTANEOUS | 11 refills | Status: DC

## 2017-09-16 NOTE — Patient Instructions (Signed)
Follow-up with endocrinology  Limit your sodium (Salt) intake   Please check your blood pressure on a regular basis.  If it is consistently greater than 150/90, please make an office appointment.  Note  new recommendations for insulin management   Please check your hemoglobin A1c every 3 months

## 2017-09-16 NOTE — Progress Notes (Signed)
Subjective:    Patient ID: Lynn Hobbs, female    DOB: [birth year], 57 y.o.   MRN: [id number]  HPI  Lab Results  Component Value Date   HGBA1C 7.9 (A) 09/16/2017   57 year old patient who is seen today for follow-up of diabetes. After complications include cerebrovascular disease coronary artery disease as well as chronic kidney disease  3 months ago hemoglobin A1c was 9.0.  Today improved to 7.9.  She states that she is only taking 40 units of Levemir and not 46 as directed. Fasting blood sugars generally range from 80-1 30 Blood sugars prior to her evening meal range from 70-2 01  Yesterday she had some mild hypoglycemic symptoms with a blood sugar of 70 that occurred at approximately 2 PM.  She has had no recent endocrinology follow-up.  This was encouraged today;  she is scheduled for follow-up ophthalmology visit next month. She only consumes 2 meals daily brunch and her evening meal  Past Medical History:  Diagnosis Date  . BENIGN NEOPLASM OF SKIN SITE UNSPECIFIED 09/08/2008  . CEREBROVASCULAR DISEASE 12/11/2008  . DIABETES MELLITUS, TYPE II 10/12/2006  . Endometriosis   . Fibroids   . HYPERLIPIDEMIA 10/12/2006  . HYPERTENSION 10/12/2006  . PERIPHERAL NEUROPATHY 10/21/2006  . Stroke [facility])      Social History   Socioeconomic History  . Marital status: Single    Spouse name: Not on file  . Number of children: Not on file  . Years of education: Not on file  . Highest education level: Not on file  Occupational History  . Not on file  Social Needs  . Financial resource strain: Not on file  . Food insecurity:    Worry: Not on file    Inability: Not on file  . Transportation needs:    Medical: Not on file    Non-medical: Not on file  Tobacco Use  . Smoking status: Never Smoker  . Smokeless tobacco: Never Used  Substance and Sexual Activity  . Alcohol use: No  . Drug use: No  . Sexual activity: Not on file  Lifestyle  . Physical activity:    Days per week: Not on  file    Minutes per session: Not on file  . Stress: Not on file  Relationships  . Social connections:    Talks on phone: Not on file    Gets together: Not on file    Attends religious service: Not on file    Active member of club or organization: Not on file    Attends meetings of clubs or organizations: Not on file    Relationship status: Not on file  . Intimate partner violence:    Fear of current or ex partner: Not on file    Emotionally abused: Not on file    Physically abused: Not on file    Forced sexual activity: Not on file  Other Topics Concern  . Not on file  Social History Narrative   Regular exercise: no   Caffeine use: ocassionally    Past Surgical History:  Procedure Laterality Date  . ABDOMINAL HYSTERECTOMY    . LEFT HEART CATHETERIZATION WITH CORONARY ANGIOGRAM N/A 03/24/2013   Procedure: LEFT HEART CATHETERIZATION WITH CORONARY ANGIOGRAM;  Surgeon: Troy Sine, MD;  Location: [facility] CATH LAB;  Service: Cardiovascular;  Laterality: N/A;    History reviewed. No pertinent family history.  Allergies  Allergen Reactions  . Tape Rash    Current Outpatient Medications on File Prior to Visit  Medication Sig Dispense Refill  . acetaminophen (TYLENOL) 650 MG CR tablet Take 1,300 mg by mouth daily as needed for pain.     Marland Kitchen amLODipine (NORVASC) 10 MG tablet TAKE 1 TABLET BY MOUTH  DAILY 90 tablet 2  . aspirin EC 81 MG tablet Take 81 mg by mouth daily.    Marland Kitchen atorvastatin (LIPITOR) 80 MG tablet TAKE ONE TABLET BY MOUTH AT BEDTIME 90 tablet 0  . carvedilol (COREG) 25 MG tablet Take 50 mg by mouth 2 (two) times daily.    . chlorthalidone (HYGROTON) 25 MG tablet Take 12.5 mg by mouth daily.    . Cholecalciferol (VITAMIN D-3) 5000 units TABS Take 1 tablet by mouth daily.    . clopidogrel (PLAVIX) 75 MG tablet Take 1 tablet (75 mg total) by mouth daily. 90 tablet 3  . diphenoxylate-atropine (LOMOTIL) 2.5-0.025 MG tablet Take 1 tablet by mouth 4 (four) times daily as needed  for diarrhea or loose stools. 30 tablet 0  . ezetimibe (ZETIA) 10 MG tablet Take 1 tablet (10 mg total) by mouth daily. 90 tablet 3  . insulin aspart (NOVOLOG) 100 UNIT/ML FlexPen Inject 30 Units into the skin 2 (two) times daily before a meal. (lunch and dinner) 4 pen 11  . insulin detemir (LEVEMIR) 100 UNIT/ML injection Inject 0.46 mLs (46 Units total) into the skin at bedtime. 10 mL 11  . Insulin Pen Needle (PEN NEEDLES) 32G X 4 MM MISC 1 applicator by Does not apply route 4 (four) times daily. Dx E11.9  ([place] Pen needles) 100 each 6  . losartan (COZAAR) 100 MG tablet Take 1 tablet (100 mg total) by mouth daily. 90 tablet 3  . Menthol, Topical Analgesic, (BLUE-EMU MAXIMUM STRENGTH) 2.5 % LIQD Apply 1 spray topically as needed (pain).    . nitroGLYCERIN (NITROSTAT) 0.4 MG SL tablet PLACE ONE TABLET UNDER THE TONGUE EVERY 5 MINUTES FOR 3 DOSES AS NEEDED FOR CHEST PAIN 25 tablet 2  . potassium chloride SA (KLOR-CON M20) 20 MEQ tablet Take 1 tablet (20 mEq total) by mouth daily. 90 tablet 3   No current facility-administered medications on file prior to visit.     BP 120/64 (BP Location: Right Arm, Patient Position: Sitting, Cuff Size: Large)   Pulse 76   Temp 98 F (36.7 C) (Oral)   Wt 222 lb (100.7 kg)   SpO2 99%   BMI 35.83 kg/m     Review of Systems  Constitutional: Positive for fatigue.  HENT: Negative for congestion, dental problem, hearing loss, rhinorrhea, sinus pressure, sore throat and tinnitus.   Eyes: Negative for pain, discharge and visual disturbance.  Respiratory: Negative for cough and shortness of breath.   Cardiovascular: Negative for chest pain, palpitations and leg swelling.  Gastrointestinal: Negative for abdominal distention, abdominal pain, blood in stool, constipation, diarrhea, nausea and vomiting.  Genitourinary: Negative for difficulty urinating, dysuria, flank pain, frequency, hematuria, pelvic pain, urgency, vaginal bleeding, vaginal discharge and vaginal  pain.  Musculoskeletal: Positive for gait problem. Negative for arthralgias and joint swelling.  Skin: Negative for rash.  Neurological: Positive for weakness. Negative for dizziness, syncope, speech difficulty, numbness and headaches.  Hematological: Negative for adenopathy.  Psychiatric/Behavioral: Negative for agitation, behavioral problems and dysphoric mood. The patient is not nervous/anxious.        Objective:   Physical Exam  Constitutional: She is oriented to person, place, and time. She appears well-developed and well-nourished.  Blood pressure 130/70  HENT:  Head: Normocephalic.  Right Ear: External  ear normal.  Left Ear: External ear normal.  Mouth/Throat: Oropharynx is clear and moist.  Eyes: Pupils are equal, round, and reactive to light. Conjunctivae and EOM are normal.  Neck: Normal range of motion. Neck supple. No thyromegaly present.  Cardiovascular: Normal rate, regular rhythm, normal heart sounds and intact distal pulses.  Pulmonary/Chest: Effort normal and breath sounds normal.  Abdominal: Soft. Bowel sounds are normal. She exhibits no mass. There is no tenderness.  Musculoskeletal: Normal range of motion.  Lymphadenopathy:    She has no cervical adenopathy.  Neurological: She is alert and oriented to person, place, and time.  Left hemiparesis  Skin: Skin is warm and dry. No rash noted.  Psychiatric: She has a normal mood and affect. Her behavior is normal.          Assessment & Plan:   Diabetes mellitus.  Improved hemoglobin A1c but still suboptimal control.  Will increase Levemir to 46 units at bedtime. Continue preprandial NovoLog 30 units.  We will add an additional 4 units if blood sugar greater than 150 Hypertension well-controlled Cerebrovascular disease Coronary artery disease  The patient has been asked to follow-up with endocrinology Insulin adjusted Follow-up 3 months  Marletta Lor

## 2017-09-20 ENCOUNTER — Other Ambulatory Visit: Payer: Self-pay | Admitting: Internal Medicine

## 2017-09-20 DIAGNOSIS — I10 Essential (primary) hypertension: Secondary | ICD-10-CM

## 2017-09-21 NOTE — Telephone Encounter (Signed)
Please advise. This has always been rx'd by cardiology in the past.

## 2017-09-23 ENCOUNTER — Other Ambulatory Visit: Payer: Self-pay | Admitting: Internal Medicine

## 2017-09-23 DIAGNOSIS — I10 Essential (primary) hypertension: Secondary | ICD-10-CM

## 2017-09-23 MED ORDER — CARVEDILOL 25 MG PO TABS: 50.0000 mg | ORAL_TABLET | Freq: Two times a day (BID) | ORAL | 0 refills | Status: DC

## 2017-10-20 ENCOUNTER — Encounter (INDEPENDENT_AMBULATORY_CARE_PROVIDER_SITE_OTHER): Payer: Medicare Other | Admitting: Ophthalmology

## 2017-10-20 DIAGNOSIS — H2513 Age-related nuclear cataract, bilateral: Secondary | ICD-10-CM | POA: Diagnosis not present

## 2017-10-20 DIAGNOSIS — I10 Essential (primary) hypertension: Secondary | ICD-10-CM | POA: Diagnosis not present

## 2017-10-20 DIAGNOSIS — E11319 Type 2 diabetes mellitus with unspecified diabetic retinopathy without macular edema: Secondary | ICD-10-CM

## 2017-10-20 DIAGNOSIS — H35033 Hypertensive retinopathy, bilateral: Secondary | ICD-10-CM | POA: Diagnosis not present

## 2017-10-20 DIAGNOSIS — E113593 Type 2 diabetes mellitus with proliferative diabetic retinopathy without macular edema, bilateral: Secondary | ICD-10-CM

## 2017-10-20 DIAGNOSIS — H43813 Vitreous degeneration, bilateral: Secondary | ICD-10-CM

## 2017-10-22 ENCOUNTER — Other Ambulatory Visit: Payer: Self-pay | Admitting: Internal Medicine

## 2017-11-03 DIAGNOSIS — E785 Hyperlipidemia, unspecified: Secondary | ICD-10-CM | POA: Diagnosis not present

## 2017-11-03 DIAGNOSIS — Z79899 Other long term (current) drug therapy: Secondary | ICD-10-CM | POA: Diagnosis not present

## 2017-11-04 LAB — COMPREHENSIVE METABOLIC PANEL
ALK PHOS: 61 IU/L (ref 39–117)
ALT: 20 IU/L (ref 0–32)
AST: 17 IU/L (ref 0–40)
Albumin/Globulin Ratio: 1.3 (ref 1.2–2.2)
Albumin: 4.6 g/dL (ref 3.5–5.5)
BILIRUBIN TOTAL: 0.3 mg/dL (ref 0.0–1.2)
BUN/Creatinine Ratio: 20 (ref 9–23)
BUN: 26 mg/dL — ABNORMAL HIGH (ref 6–24)
CHLORIDE: 99 mmol/L (ref 96–106)
CO2: 24 mmol/L (ref 20–29)
CREATININE: 1.31 mg/dL — AB (ref 0.57–1.00)
Calcium: 10.4 mg/dL — ABNORMAL HIGH (ref 8.7–10.2)
GFR calc Af Amer: 52 mL/min/{1.73_m2} — ABNORMAL LOW (ref >59)
GFR calc non Af Amer: 45 mL/min/{1.73_m2} — ABNORMAL LOW (ref >59)
GLOBULIN, TOTAL: 3.6 g/dL (ref 1.5–4.5)
GLUCOSE: 243 mg/dL — AB (ref 65–99)
Potassium: 3.9 mmol/L (ref 3.5–5.2)
SODIUM: 140 mmol/L (ref 134–144)
Total Protein: 8.2 g/dL (ref 6.0–8.5)

## 2017-11-04 LAB — LIPID PANEL
CHOLESTEROL TOTAL: 181 mg/dL (ref 100–199)
Chol/HDL Ratio: 3.1 ratio (ref 0.0–4.4)
HDL: 59 mg/dL (ref >39)
LDL CALC: 102 mg/dL — AB (ref 0–99)
TRIGLYCERIDES: 100 mg/dL (ref 0–149)
VLDL Cholesterol Cal: 20 mg/dL (ref 5–40)

## 2017-11-19 DIAGNOSIS — I639 Cerebral infarction, unspecified: Secondary | ICD-10-CM | POA: Insufficient documentation

## 2017-11-21 ENCOUNTER — Other Ambulatory Visit: Payer: Self-pay | Admitting: Internal Medicine

## 2017-11-25 ENCOUNTER — Ambulatory Visit (INDEPENDENT_AMBULATORY_CARE_PROVIDER_SITE_OTHER): Payer: Medicare Other | Admitting: Cardiovascular Disease

## 2017-11-25 ENCOUNTER — Encounter: Payer: Self-pay | Admitting: Cardiovascular Disease

## 2017-11-25 VITALS — BP 138/72 | HR 75 | Ht 66.0 in | Wt 222.4 lb

## 2017-11-25 DIAGNOSIS — E785 Hyperlipidemia, unspecified: Secondary | ICD-10-CM

## 2017-11-25 DIAGNOSIS — E669 Obesity, unspecified: Secondary | ICD-10-CM

## 2017-11-25 DIAGNOSIS — Z794 Long term (current) use of insulin: Secondary | ICD-10-CM | POA: Diagnosis not present

## 2017-11-25 DIAGNOSIS — I10 Essential (primary) hypertension: Secondary | ICD-10-CM | POA: Diagnosis not present

## 2017-11-25 DIAGNOSIS — E1140 Type 2 diabetes mellitus with diabetic neuropathy, unspecified: Secondary | ICD-10-CM

## 2017-11-25 DIAGNOSIS — E668 Other obesity: Secondary | ICD-10-CM

## 2017-11-25 DIAGNOSIS — I2510 Atherosclerotic heart disease of native coronary artery without angina pectoris: Secondary | ICD-10-CM | POA: Diagnosis not present

## 2017-11-25 DIAGNOSIS — Z8673 Personal history of transient ischemic attack (TIA), and cerebral infarction without residual deficits: Secondary | ICD-10-CM | POA: Diagnosis not present

## 2017-11-25 MED ORDER — CHLORTHALIDONE 25 MG PO TABS: 25.0000 mg | ORAL_TABLET | Freq: Every day | ORAL | 3 refills | Status: DC

## 2017-11-25 NOTE — Patient Instructions (Signed)
Medication Instructions:  INCREASE chlorthalidone to 25 mg daily  Repatha per pharmacist  Follow-Up: 3-4 months with Dr. Claiborne Billings  Any Other Special Instructions Will Be Listed Below (If Applicable).     If you need a refill on your cardiac medications before your next appointment, please call your pharmacy.

## 2017-11-25 NOTE — Progress Notes (Signed)
Patient ID: Lynn Hobbs, female   DOB: Feb 12, 1961, 57 y.o.   MRN: [id number]     Primary M.D. : Dr. Bluford Kaufmann  HPI: Lynn Hobbs is a 57 y.o. female who presents to the office office for a 6 month cardiology evaluation.  Lynn Hobbs has a history of diabetes mellitus with neurologic complications,a history of prior CVA, hyperlipidemia, and peripheral neuropathy. She developed chest pain leading to her hospitalization on 03/23/2013. ECG after arrival to the hospital in the early morning showed ST elevation anteriorly. Troponin was positive for 0.86 and urgent catheterization  performed by me demonstrated a 99% stenosis in the LAD  proximal to the diagonal vessel with initial TIMI 1/2 flow. She had a diffusely diseased distal LAD with 50-60% stenosis, and 40-50% narrowings in the first diagonal vessel. The circumflex vessel had mild irregularities. The RCA was diffusely diseased with 40% near ostial narrowing, diffuse 70-80% mid stenoses, 80% stenosis before the acute margin, and 60% distal stenosis.  She underwent insertion of a 3.0 x18 mm Xience Alpine stent postdilated 3.25 mm  in her mid LAD.  She was discharged on 03/27/2013. She was sent home with a life-vest.   During the hospital her LDL cholesterol was 184. Hemoglobin A1c was 8.1. A 2-D echo Doppler study pre-discharge following her initial event showed an ejection fraction of 30-35% and for this reason a life vest was initially recommended with plans for a followup echo in 3 months.  A followup echo Doppler study on June 14, 2013 showed improved LV function with ejection fraction increasing to 45-50%.  She did have left ventricular hypertrophy.  There was akinesis of the apical myocardium.  Her LifeVest was discontinued.  Followup blood work on May 25, 2013 showed markedly improved lipid status on her atorvastatin 80 mg with her total cholesterol improving from 259 to150 and your LDL cholesterol from 184 to 86.  HDL was 50,  triglycerides 68.  She underwent a nuclear perfusion study 04/22/2016.  This showed an ejection fraction of 55%.  There was a defect that was nonreversible and consistent with prior infarct in the anteroseptal mid inferior, apical anterior, apical septal, apical inferior and apical lateral wall.  There was no ischemia.    When Isaw her in November 2018 her blood pressure was elevated and I suggested that she try alternating chlorthalidone 25 mg with 12.5 mg every other day.  She was on amlodipine 10 mg, carvedilol 37.5 mg twice a day, losartan 100 mg daily.  Over the past 6 months, she denies any episodes of chest pain or palpitations.  Denies PND orthopnea.  Lab work on July 06, 2017 showed increased glucose of 246.  Creatinine was 1.34.  Her cholesterol was slightly improved with with the addition of Zetia 10 mg to atorvastatin 80 mg which was started in February 2019after her LDL cholesterol was 147.    I last saw her in May 2019.  Blood pressure was improved on her regimen consisting of amlodipine 10 mg carvedilol 50 mg twice a day, chlorthalidone 12.5 mg and losartan 100 mg daily.  Zetia 10 mg was added to atorvastatin 80 mg and repeat lab work showed improvement of LDL but this was still elevated at 122.  She substernally had follow-up laboratory in November 03, 2017 which continues to show improvement but LDL, cholesterol continues to be elevated at 102.  Total cholesterol was 181.  She continues to be on aspirin and Plavix for antiplatelet benefit.  She presents for  reevaluation.  Past Medical History:  Diagnosis Date  . BENIGN NEOPLASM OF SKIN SITE UNSPECIFIED 09/08/2008  . CEREBROVASCULAR DISEASE 12/11/2008  . DIABETES MELLITUS, TYPE II 10/12/2006  . Endometriosis   . Fibroids   . HYPERLIPIDEMIA 10/12/2006  . HYPERTENSION 10/12/2006  . PERIPHERAL NEUROPATHY 10/21/2006  . Stroke [facility])     Past Surgical History:  Procedure Laterality Date  . ABDOMINAL HYSTERECTOMY    . LEFT HEART CATHETERIZATION  WITH CORONARY ANGIOGRAM N/A 03/24/2013   Procedure: LEFT HEART CATHETERIZATION WITH CORONARY ANGIOGRAM;  Surgeon: Troy Sine, MD;  Location: [facility] CATH LAB;  Service: Cardiovascular;  Laterality: N/A;    Allergies  Allergen Reactions  . Tape Rash    Current Outpatient Medications  Medication Sig Dispense Refill  . acetaminophen (TYLENOL) 650 MG CR tablet Take 1,300 mg by mouth daily as needed for pain.     Marland Kitchen amLODipine (NORVASC) 10 MG tablet TAKE 1 TABLET BY MOUTH  DAILY 90 tablet 2  . aspirin EC 81 MG tablet Take 81 mg by mouth daily.    Marland Kitchen atorvastatin (LIPITOR) 80 MG tablet TAKE ONE TABLET BY MOUTH AT BEDTIME 90 tablet 0  . carvedilol (COREG) 25 MG tablet Take 50 mg by mouth 2 (two) times daily.    . carvedilol (COREG) 25 MG tablet Take 2 tablets (50 mg total) by mouth 2 (two) times daily. 270 tablet 0  . chlorthalidone (HYGROTON) 25 MG tablet Take 1 tablet (25 mg total) by mouth daily. 90 tablet 3  . Cholecalciferol (VITAMIN D-3) 5000 units TABS Take 1 tablet by mouth daily.    . clopidogrel (PLAVIX) 75 MG tablet Take 1 tablet (75 mg total) by mouth daily. 90 tablet 3  . diphenoxylate-atropine (LOMOTIL) 2.5-0.025 MG tablet Take 1 tablet by mouth 4 (four) times daily as needed for diarrhea or loose stools. 30 tablet 0  . ezetimibe (ZETIA) 10 MG tablet Take 1 tablet (10 mg total) by mouth daily. 90 tablet 3  . insulin aspart (NOVOLOG) 100 UNIT/ML FlexPen 30 units prior to brunch and dinner. If blood sugar is greater than 150, add an additional 4 units 4 pen 11  . insulin detemir (LEVEMIR) 100 UNIT/ML injection Inject 0.46 mLs (46 Units total) into the skin at bedtime. 10 mL 11  . Insulin Pen Needle (PEN NEEDLES) 32G X 4 MM MISC 1 applicator by Does not apply route 4 (four) times daily. Dx E11.9  ([place] Pen needles) 100 each 6  . losartan (COZAAR) 100 MG tablet TAKE 1 TABLET BY MOUTH DAILY 90 tablet 3  . Menthol, Topical Analgesic, (BLUE-EMU MAXIMUM STRENGTH) 2.5 % LIQD Apply 1 spray  topically as needed (pain).    . nitroGLYCERIN (NITROSTAT) 0.4 MG SL tablet PLACE ONE TABLET UNDER THE TONGUE EVERY 5 MINUTES FOR 3 DOSES AS NEEDED FOR CHEST PAIN 25 tablet 2  . potassium chloride SA (K-DUR,KLOR-CON) 20 MEQ tablet TAKE 1 TABLET BY MOUTH  DAILY 90 tablet 3   No current facility-administered medications for this visit.     Social History   Socioeconomic History  . Marital status: Single    Spouse name: Not on file  . Number of children: Not on file  . Years of education: Not on file  . Highest education level: Not on file  Occupational History  . Not on file  Social Needs  . Financial resource strain: Not on file  . Food insecurity:    Worry: Not on file    Inability: Not on file  .  Transportation needs:    Medical: Not on file    Non-medical: Not on file  Tobacco Use  . Smoking status: Never Smoker  . Smokeless tobacco: Never Used  Substance and Sexual Activity  . Alcohol use: No  . Drug use: No  . Sexual activity: Not on file  Lifestyle  . Physical activity:    Days per week: Not on file    Minutes per session: Not on file  . Stress: Not on file  Relationships  . Social connections:    Talks on phone: Not on file    Gets together: Not on file    Attends religious service: Not on file    Active member of club or organization: Not on file    Attends meetings of clubs or organizations: Not on file    Relationship status: Not on file  . Intimate partner violence:    Fear of current or ex partner: Not on file    Emotionally abused: Not on file    Physically abused: Not on file    Forced sexual activity: Not on file  Other Topics Concern  . Not on file  Social History Narrative   Regular exercise: no   Caffeine use: ocassionally    No family history on file.  ROS General: Negative; No fevers, chills, or night sweats;  HEENT: Negative; No changes in vision or hearing, sinus congestion, difficulty swallowing Pulmonary: Negative; No cough,  wheezing, shortness of breath, hemoptysis Cardiovascular: See history of present illness; No chest pain, presyncope, syncope, palpitations GI: Negative; No nausea, vomiting, diarrhea, or abdominal pain GU: Negative; No dysuria, hematuria, or difficulty voiding Musculoskeletal: Positive for leg spasms. Hematologic/Oncology: Negative; no easy bruising, bleeding Endocrine: Positive for diabetes; no heat/cold intolerance; Neuro: Negative; no changes in balance, headaches Skin: Negative; No rashes or skin lesions Psychiatric: Negative; No behavioral problems, depression Sleep: Negative; No snoring, daytime sleepiness, hypersomnolence, bruxism, restless legs, hypnogognic hallucinations, no cataplexy Other comprehensive 14 point system review is negative.  PE BP 138/72   Pulse 75   Ht _0  (1.676 m)   Wt 222 lb 6.4 oz (100.9 kg)   BMI 35.90 kg/m    Repeat blood pressure by me was 140/74  Wt Readings from Last 3 Encounters:  11/25/17 222 lb 6.4 oz (100.9 kg)  09/16/17 222 lb (100.7 kg)  07/21/17 222 lb 12.8 oz (101.1 kg)   General: Alert, oriented, no distress.  Skin: normal turgor, no rashes, warm and dry HEENT: Normocephalic, atraumatic. Pupils equal round and reactive to light; sclera anicteric; extraocular muscles intact;  Nose without nasal septal hypertrophy Mouth/Parynx benign; Mallinpatti scale 3 Neck: No JVD, no carotid bruits; normal carotid upstroke Lungs: clear to ausculatation and percussion; no wheezing or rales Chest wall: without tenderness to palpitation Heart: PMI not displaced, RRR, s1 s2 normal, 1/6 systolic murmur, no diastolic murmur, no rubs, gallops, thrills, or heaves Abdomen: soft, nontender; no hepatosplenomehaly, BS+; abdominal aorta nontender and not dilated by palpation. Back: no CVA tenderness Pulses 2+ Musculoskeletal: full range of motion, normal strength, no joint deformities Extremities: no clubbing cyanosis or edema, Homan's sign negative    Neurologic: grossly nonfocal; Cranial nerves grossly wnl Psychologic: Normal mood and affect  ECG (independently read by me): Normal sinus rhythm at 75 bpm.  Inferior Q waves, Q waves V3 - 5.  QTc interval 444 ms  May 2019 ECG (independently read by me): Normal sinus rhythm at 76 bpm.  Small inferior Q waves.  Poor anterior R  wave progression.  QTc interval 468 ms.  November 2018 ECG (independently read by me): Normal sinus rhythm at 77 bpm.  Small inferior Q waves.  Q waves V3 through V6 concordant with old anterolateral infarct.  June 2017 ECG (independently read by me):  Normal sinus rhythm at 77 bpm. Q waves in lead 3 and aVF.  Q waves V3 through V6 concordant with anterolateral infarct.  No ST segment changes.  Intervals normal.  ECG (independently read by me): Normal sinus rhythm at 73 bpm.  Evidence for prior anterolateral MI.  Left axis deviation.  Inferior Q waves in 3 and aVF.  February 2016 ECG (independently read by me): Normal sinus rhythm at 79 bpm.  Inferior Q waves in lead III and F.  Poor anterior R-wave progression.  August 2015 ECG (independently read by me): Sinus rhythm at 73 beats per minute.  Probably a progression anteriorly, concordant with her prior MI. QTc interval 449 ms  06/30/2013 ECG today (independently read by me): Sinus rhythm at 79 beats per minute.  Poor with progression anteriorly.  Prior ECG (independently read by me): Normal sinus rhythm at 86 beats per minute anterior Q waves with her MI. QTc interval 459 ms   LABS:  BMP Latest Ref Rng & Units 11/03/2017 07/06/2017 04/06/2017  Glucose 65 - 99 mg/dL 243(H) 246(H) 170(H)  BUN 6 - 24 mg/dL 26(H) 25(H) 27(H)  Creatinine 0.57 - 1.00 mg/dL 1.31(H) 1.34(H) 1.20(H)  BUN/Creat Ratio 9 - _0 Sodium 134 - 144 mmol/L 140 140 143  Potassium 3.5 - 5.2 mmol/L 3.9 4.4 4.1  Chloride 96 - 106 mmol/L 99 101 99  CO2 20 - 29 mmol/L _1 Calcium 8.7 - 10.2 mg/dL 10.4(H) 9.8 10.1    Hepatic Function  Latest Ref Rng & Units 11/03/2017 07/06/2017 11/13/2016  Total Protein 6.0 - 8.5 g/dL 8.2 8.0 8.0  Albumin 3.5 - 5.5 g/dL 4.6 4.5 4.0  AST 0 - 40 IU/L _2 ALT 0 - 32 IU/L 20 22 33  Alk Phosphatase 39 - 117 IU/L 61 62 41  Total Bilirubin 0.0 - 1.2 mg/dL 0.3 0.4 0.8  Bilirubin, Direct 0.0 - 0.3 mg/dL - - -    CBC Latest Ref Rng & Units 04/06/2017 11/13/2016 11/13/2016  WBC 3.4 - 10.8 x10E3/uL 7.6 - 7.3  Hemoglobin 11.1 - 15.9 g/dL 11.7 12.6 11.6(L)  Hematocrit 34.0 - 46.6 % 35.1 37.0 36.0  Platelets 150 - 379 x10E3/uL 221 - 190    No results found for: PROBNP  Lipid Panel     Component Value Date/Time   CHOL 181 11/03/2017 0918   TRIG 100 11/03/2017 0918   HDL 59 11/03/2017 0918   CHOLHDL 3.1 11/03/2017 0918   CHOLHDL 2.6 09/12/2015 0944   VLDL 21 09/12/2015 0944   LDLCALC 102 (H) 11/03/2017 0918     RADIOLOGY: No results found.  IMPRESSION:  1. CAD in native artery   2. Essential hypertension   3. Hyperlipidemia with target LDL less than 70   4. History of stroke   5. Type 2 diabetes mellitus with diabetic neuropathy, with long-term current use of insulin (Erin)   6. Moderate obesity     ASSESSMENT AND PLAN: Lynn Hobbs is a 57 year-old African-American female who suffered an ST segment elevation myocardial infarction and presented urgently to the catheterization laboratory in the morning of 03/24/2013. She was found to have subtotal 99% LAD stenosis which was successfully  stented with a DES stent.   She initially had an ischemic cardiomyopathy and required an initial LifeVest, but with significant improvement in LV function .  The LifeVest was subsequently discontinued.  Since her acute coronary event she has been stable without recurrent anginal symptoms.  She has had hypertension and is on amlodipine 10 mg, carvedilol 50 mg twice a day, chlorthalidone 12.5 mg daily, and losartan 100 mg daily.Marland Kitchen  Her blood pressure today is elevated based on most recent guidelines with  target blood pressure less than 130/80.  I have suggested further titration of chlorthalidone to 25 mg daily.  She is now on Zetia and atorvastatin 80 mg.  LDL is still elevated at 102.  I discussed with her the for your trial in detail.  I believe we should initiate the process to start Repatha since she meets criteria particularly with her history of prior MI and CVA.  I discussed with her before your trial results with a 27% reduction in MI and 21% reduction in stroke.  She will hopefully be given a sample today.  Kristen instead, Pharm.D. will initiate the insurance process.  Repeat laboratory will be obtained in 3 months and I will see her in follow-up evaluation.  She continues to be on insulin for her diabetes mellitus.  Time spent: 25 minutes  Troy Sine, MD, [facility]  11/27/2017 11:56 PM

## 2017-11-27 ENCOUNTER — Encounter: Payer: Self-pay | Admitting: Cardiovascular Disease

## 2017-11-30 ENCOUNTER — Other Ambulatory Visit: Payer: Self-pay | Admitting: Pharmacist Clinician (PhC)/ Clinical Pharmacy Specialist

## 2017-11-30 MED ORDER — EVOLOCUMAB 140 MG/ML ~~LOC~~ SOAJ: 140.0000 mg | SUBCUTANEOUS | 12 refills | Status: DC

## 2017-12-07 ENCOUNTER — Other Ambulatory Visit: Payer: Self-pay | Admitting: Internal Medicine

## 2017-12-08 NOTE — Telephone Encounter (Signed)
Pt aware to follow up with new PCP for further refills due to Dr.Kwiakowski's retiring.  

## 2018-01-03 ENCOUNTER — Telehealth: Payer: Self-pay | Admitting: Pharmacist Clinician (PhC)/ Clinical Pharmacy Specialist

## 2018-01-03 DIAGNOSIS — E785 Hyperlipidemia, unspecified: Secondary | ICD-10-CM

## 2018-01-03 NOTE — Telephone Encounter (Signed)
Lab order for Repatha PA

## 2018-01-28 ENCOUNTER — Other Ambulatory Visit: Payer: Self-pay | Admitting: Cardiovascular Disease

## 2018-01-28 DIAGNOSIS — E785 Hyperlipidemia, unspecified: Secondary | ICD-10-CM | POA: Diagnosis not present

## 2018-01-29 LAB — LIPID PANEL W/O CHOL/HDL RATIO
Cholesterol, Total: 150 mg/dL (ref 100–199)
HDL: 63 mg/dL (ref >39)
LDL CALC: 65 mg/dL (ref 0–99)
Triglycerides: 109 mg/dL (ref 0–149)
VLDL Cholesterol Cal: 22 mg/dL (ref 5–40)

## 2018-02-02 ENCOUNTER — Ambulatory Visit (INDEPENDENT_AMBULATORY_CARE_PROVIDER_SITE_OTHER): Payer: Medicare Other | Admitting: Internal Medicine

## 2018-02-02 ENCOUNTER — Other Ambulatory Visit: Payer: Self-pay | Admitting: Internal Medicine

## 2018-02-02 ENCOUNTER — Encounter: Payer: Self-pay | Admitting: Internal Medicine

## 2018-02-02 VITALS — BP 148/80 | HR 79 | Temp 98.1°F | Ht 65.0 in | Wt 223.1 lb

## 2018-02-02 DIAGNOSIS — E785 Hyperlipidemia, unspecified: Secondary | ICD-10-CM

## 2018-02-02 DIAGNOSIS — E669 Obesity, unspecified: Secondary | ICD-10-CM | POA: Diagnosis not present

## 2018-02-02 DIAGNOSIS — Z794 Long term (current) use of insulin: Secondary | ICD-10-CM | POA: Diagnosis not present

## 2018-02-02 DIAGNOSIS — Z1239 Encounter for other screening for malignant neoplasm of breast: Secondary | ICD-10-CM

## 2018-02-02 DIAGNOSIS — I2510 Atherosclerotic heart disease of native coronary artery without angina pectoris: Secondary | ICD-10-CM

## 2018-02-02 DIAGNOSIS — E1140 Type 2 diabetes mellitus with diabetic neuropathy, unspecified: Secondary | ICD-10-CM | POA: Diagnosis not present

## 2018-02-02 DIAGNOSIS — Z6837 Body mass index (BMI) 37.0-37.9, adult: Secondary | ICD-10-CM

## 2018-02-02 DIAGNOSIS — I10 Essential (primary) hypertension: Secondary | ICD-10-CM | POA: Diagnosis not present

## 2018-02-02 DIAGNOSIS — Z23 Encounter for immunization: Secondary | ICD-10-CM | POA: Diagnosis not present

## 2018-02-02 DIAGNOSIS — Z1211 Encounter for screening for malignant neoplasm of colon: Secondary | ICD-10-CM

## 2018-02-02 LAB — HEMOGLOBIN A1C: HEMOGLOBIN A1C: 9 % — AB (ref 4.6–6.5)

## 2018-02-02 MED ORDER — CHLORTHALIDONE 50 MG PO TABS: 25.0000 mg | ORAL_TABLET | Freq: Every day | ORAL | 3 refills | Status: DC

## 2018-02-02 MED ORDER — CHLORTHALIDONE 50 MG PO TABS: 50.0000 mg | ORAL_TABLET | Freq: Every day | ORAL | 3 refills | Status: DC

## 2018-02-02 MED ORDER — INSULIN DETEMIR 100 UNIT/ML ~~LOC~~ SOLN: 50.0000 [IU] | Freq: Every day | SUBCUTANEOUS | 11 refills | Status: DC

## 2018-02-02 NOTE — Progress Notes (Addendum)
Established Patient Office Visit     CC/Reason for Visit: Establish care, follow-up on chronic medical conditions  HPI: Lynn Hobbs is a 57 y.o. female who is coming in today for the above mentioned reasons. Past Medical History is significant for: Ischemic cardiomyopathy with ST elevated MI in 2015 with stent to LAD, followed by Dr. Claiborne Billings, insulin-dependent type 2 diabetes, hypertension, hyperlipidemia.  She was recently started on a PSK 9 inhibitor in addition to her atorvastatin and Zetia and has had significant reduction in her cholesterol, CBGs remain uncontrolled, measurements at home between 70 and 195.  She is currently taking 46 units of Levemir at bedtime and 30 units of NovoLog prelunch and predinner with an additional 4 units if CBG is above 150.  Last A1c was 7.9, improved from 9 previously.  Her hypertension also remains uncontrolled despite increase in chlorthalidone dose from 12.5-25 mg in September.  She has no acute complaints at today's visit.  Gets yearly eye exams, is due in February 2020.  Past due for screening mammogram and colonoscopy.   Past Medical/Surgical History: Past Medical History:  Diagnosis Date  . BENIGN NEOPLASM OF SKIN SITE UNSPECIFIED 09/08/2008  . CEREBROVASCULAR DISEASE 12/11/2008  . DIABETES MELLITUS, TYPE II 10/12/2006  . Endometriosis   . Fibroids   . HYPERLIPIDEMIA 10/12/2006  . HYPERTENSION 10/12/2006  . PERIPHERAL NEUROPATHY 10/21/2006  . Stroke [facility])     Past Surgical History:  Procedure Laterality Date  . ABDOMINAL HYSTERECTOMY    . LEFT HEART CATHETERIZATION WITH CORONARY ANGIOGRAM N/A 03/24/2013   Procedure: LEFT HEART CATHETERIZATION WITH CORONARY ANGIOGRAM;  Surgeon: Troy Sine, MD;  Location: [facility] CATH LAB;  Service: Cardiovascular;  Laterality: N/A;    Social History:  reports that she has never smoked. She has never used smokeless tobacco. She reports that she does not drink alcohol or use drugs.  Allergies: Allergies    Allergen Reactions  . Tape Rash    Family History:  No family history of heart disease  Current Outpatient Medications:  .  acetaminophen (TYLENOL) 650 MG CR tablet, Take 1,300 mg by mouth daily as needed for pain. , Disp: , Rfl:  .  amLODipine (NORVASC) 10 MG tablet, TAKE 1 TABLET BY MOUTH  DAILY, Disp: 90 tablet, Rfl: 2 .  aspirin EC 81 MG tablet, Take 81 mg by mouth daily., Disp: , Rfl:  .  atorvastatin (LIPITOR) 80 MG tablet, TAKE ONE TABLET BY MOUTH AT BEDTIME, Disp: 90 tablet, Rfl: 0 .  carvedilol (COREG) 25 MG tablet, Take 50 mg by mouth 2 (two) times daily., Disp: , Rfl:  .  carvedilol (COREG) 25 MG tablet, Take 2 tablets (50 mg total) by mouth 2 (two) times daily., Disp: 270 tablet, Rfl: 0 .  chlorthalidone (HYGROTON) 50 MG tablet, Take 1 tablet (50 mg total) by mouth daily., Disp: 30 tablet, Rfl: 3 .  Cholecalciferol (VITAMIN D-3) 5000 units TABS, Take 1 tablet by mouth daily., Disp: , Rfl:  .  clopidogrel (PLAVIX) 75 MG tablet, Take 1 tablet (75 mg total) by mouth daily., Disp: 90 tablet, Rfl: 3 .  diphenoxylate-atropine (LOMOTIL) 2.5-0.025 MG tablet, Take 1 tablet by mouth 4 (four) times daily as needed for diarrhea or loose stools., Disp: 30 tablet, Rfl: 0 .  Evolocumab (REPATHA SURECLICK) 505 MG/ML SOAJ, Inject 140 mg into the skin every 14 (fourteen) days., Disp: 2 pen, Rfl: 12 .  ezetimibe (ZETIA) 10 MG tablet, Take 1 tablet (10 mg total) by  mouth daily., Disp: 90 tablet, Rfl: 3 .  insulin aspart (NOVOLOG) 100 UNIT/ML FlexPen, 30 units prior to brunch and dinner. If blood sugar is greater than 150, add an additional 4 units, Disp: 4 pen, Rfl: 11 .  insulin detemir (LEVEMIR) 100 UNIT/ML injection, Inject 0.46 mLs (46 Units total) into the skin at bedtime., Disp: 10 mL, Rfl: 11 .  losartan (COZAAR) 100 MG tablet, TAKE 1 TABLET BY MOUTH DAILY, Disp: 90 tablet, Rfl: 3 .  Menthol, Topical Analgesic, (BLUE-EMU MAXIMUM STRENGTH) 2.5 % LIQD, Apply 1 spray topically as needed (pain).,  Disp: , Rfl:  .  nitroGLYCERIN (NITROSTAT) 0.4 MG SL tablet, PLACE ONE TABLET UNDER THE TONGUE EVERY 5 MINUTES FOR 3 DOSES AS NEEDED FOR CHEST PAIN, Disp: 25 tablet, Rfl: 2 .  potassium chloride SA (K-DUR,KLOR-CON) 20 MEQ tablet, TAKE 1 TABLET BY MOUTH  DAILY, Disp: 90 tablet, Rfl: 3 .  RELION PEN NEEDLES 32G X 4 MM MISC, USE 4 TIMES DAILY, Disp: 100 each, Rfl: 6  Review of Systems:  Constitutional: Denies fever, chills, diaphoresis, appetite change and fatigue.  HEENT: Denies photophobia, eye pain, redness, hearing loss, ear pain, congestion, sore throat, rhinorrhea, sneezing, mouth sores, trouble swallowing, neck pain, neck stiffness and tinnitus.   Respiratory: Denies SOB, DOE, cough, chest tightness,  and wheezing.   Cardiovascular: Denies chest pain, palpitations and leg swelling.  Gastrointestinal: Denies nausea, vomiting, abdominal pain, diarrhea, constipation, blood in stool and abdominal distention.  Genitourinary: Denies dysuria, urgency, frequency, hematuria, flank pain and difficulty urinating.  Endocrine: Denies: hot or cold intolerance, sweats, changes in hair or nails, polyuria, polydipsia. Musculoskeletal: Denies myalgias, back pain, joint swelling, arthralgias and gait problem.  Skin: Denies pallor, rash and wound.  Neurological: Denies dizziness, seizures, syncope, weakness, light-headedness, numbness and headaches.  Hematological: Denies adenopathy. Easy bruising, personal or family bleeding history  Psychiatric/Behavioral: Denies suicidal ideation, mood changes, confusion, nervousness, sleep disturbance and agitation    Physical Exam: Vitals:   02/02/18 0950  BP: (!) 148/80  Pulse: 79  Temp: 98.1 F (36.7 C)  TempSrc: Oral  SpO2: 99%  Weight: 223 lb 1.6 oz (101.2 kg)  Height: 5\' 5"  (1.651 m)    Body mass index is 37.13 kg/m.   Constitutional: NAD, calm, comfortable Eyes: PERRL, lids and conjunctivae normal ENMT: Mucous membranes are moist. Posterior pharynx  clear of any exudate or lesions. Normal dentition.  Neck: normal, supple, no masses, no thyromegaly Respiratory: clear to auscultation bilaterally, no wheezing, no crackles. Normal respiratory effort. No accessory muscle use.  Cardiovascular: Regular rate and rhythm, no murmurs / rubs / gallops. No extremity edema. 2+ pedal pulses. No carotid bruits.  Abdomen: no tenderness, no masses palpated. No hepatosplenomegaly. Bowel sounds positive.  Musculoskeletal: no clubbing / cyanosis. No joint deformity upper and lower extremities. Good ROM, no contractures. Normal muscle tone.  Skin: no rashes, lesions, ulcers. No induration Neurologic: Grossly intact and nonfocal Psychiatric: Normal judgment and insight. Alert and oriented x 3. Normal mood.    Impression and Plan:  Essential hypertension - Plan: chlorthalidone (HYGROTON) 50 MG tablet, DISCONTINUED: chlorthalidone (HYGROTON) 50 MG tablet -Blood pressure remains uncontrolled at around 148/80. -Her doses of Norvasc, Coreg, losartan are maxed out. -Will increase chlorthalidone further from 25 to 50 mg daily. -I will plan to see her back in 6 weeks for blood pressure check and basic metabolic profile to follow renal function and electrolytes.  Colon cancer screening - Plan: Ambulatory referral to Gastroenterology  Breast cancer screening -  Plan: MM Digital Screening  Need for prophylactic vaccination against Streptococcus pneumoniae (pneumococcus) - Plan: Pneumococcal polysaccharide vaccine 23-valent greater than or equal to 2yo subcutaneous/IM  Dyslipidemia -She was recently started on a PSK 9 inhibitor 2 months ago and has had significant reduction in her LDL to 65. -Also on atorvastatin 80 and Zetia 10. -Continue follow-up with cardiology  Obesity (BMI 30-39.9) -Extensively discussed diet and lifestyle modifications, especially as it relates to her diabetes, hypertension and heart disease.  Type 2 diabetes mellitus with diabetic  neuropathy, with long-term current use of insulin ([place]) - Plan: Hemoglobin A1c -Due for repeat A1c today. -Based on her CBGs it appears that there is still some work that can be done here. -We will likely need to increase Levemir if A1c is not significantly reduced.  Addendum: A1C has increased from 7.9 to 9. Will increase levemir to 50 units.    Patient Instructions  -A1c today.    -Increase your chlorthalidone dose from 25 to 50 mg daily.  -Please follow-up with me in 6 weeks for blood pressure check.  -Pneumonia shot today.  -We will send out referrals for your mammogram and colonoscopy which are due.     Lelon Frohlich, MD [place] [facility]

## 2018-02-02 NOTE — Addendum Note (Signed)
Addended by: Lelon Frohlich Y on: 02/02/2018 03:59 PM   Modules accepted: Orders

## 2018-02-02 NOTE — Patient Instructions (Addendum)
-  A1c today.    -Increase your chlorthalidone dose from 25 to 50 mg daily.  -Please follow-up with me in 6 weeks for blood pressure check.  -Pneumonia shot today.  -We will send out referrals for your mammogram and colonoscopy which are due.

## 2018-02-03 NOTE — Telephone Encounter (Signed)
Left message on machine for patient to return our call 

## 2018-03-04 ENCOUNTER — Telehealth: Payer: Self-pay

## 2018-03-04 NOTE — Telephone Encounter (Signed)
Called to ask for changes in insurance due to the fact that their pharmacy insurance stated that they are only with them thru the end of the year.

## 2018-03-04 NOTE — Telephone Encounter (Signed)
Pt called and stated transitioning insurances into [facility] and gave the info on the card so that I can start the pa

## 2018-03-19 ENCOUNTER — Encounter: Payer: Self-pay | Admitting: Internal Medicine

## 2018-03-19 ENCOUNTER — Ambulatory Visit (INDEPENDENT_AMBULATORY_CARE_PROVIDER_SITE_OTHER): Payer: Medicare Other | Admitting: Internal Medicine

## 2018-03-19 VITALS — BP 110/70 | HR 74 | Temp 98.1°F | Wt 221.3 lb

## 2018-03-19 DIAGNOSIS — I10 Essential (primary) hypertension: Secondary | ICD-10-CM | POA: Diagnosis not present

## 2018-03-19 DIAGNOSIS — Z794 Long term (current) use of insulin: Secondary | ICD-10-CM

## 2018-03-19 DIAGNOSIS — E1140 Type 2 diabetes mellitus with diabetic neuropathy, unspecified: Secondary | ICD-10-CM | POA: Diagnosis not present

## 2018-03-19 LAB — BASIC METABOLIC PANEL
BUN: 23 mg/dL (ref 6–23)
CHLORIDE: 98 meq/L (ref 96–112)
CO2: 30 mEq/L (ref 19–32)
CREATININE: 1.35 mg/dL — AB (ref 0.40–1.20)
Calcium: 10.2 mg/dL (ref 8.4–10.5)
GFR: 51.87 mL/min — ABNORMAL LOW (ref >60.00)
Glucose, Bld: 248 mg/dL — ABNORMAL HIGH (ref 70–99)
Potassium: 4.6 mEq/L (ref 3.5–5.1)
Sodium: 138 mEq/L (ref 135–145)

## 2018-03-19 MED ORDER — AMLODIPINE BESYLATE 10 MG PO TABS: 10.0000 mg | ORAL_TABLET | Freq: Every day | ORAL | 1 refills | Status: DC

## 2018-03-19 NOTE — Patient Instructions (Addendum)
-  It was nice seeing you today!  -Blood pressure is much improved.  -Lab work today. We will notify you when results are available.  -Schedule follow up in 3 months.

## 2018-03-19 NOTE — Progress Notes (Signed)
Established Patient Office Visit     CC/Reason for Visit: Blood pressure follow-up  HPI: Lynn Hobbs is a 58 y.o. female who is coming in today for the above mentioned reasons. Past Medical History is significant for: Ischemic cardiomyopathy, insulin-dependent type 2 diabetes, hypertension, hyperlipidemia.  She was seen in the office 6 weeks ago for a transfer of care appointment and was noticed to be hypertensive above goal.  Her chlorthalidone was increased from 25 to 50 mg and she comes in today for follow-up.  Blood pressure has significantly improved.  Her A1c was also noted to have increased from 7.9-9.0 at last visit and her Levemir was increased to 50 units.  Her fasting blood sugars this morning have been in the upper 90s since this change.  She has no acute complaints at today's visit.   Past Medical/Surgical History: Past Medical History:  Diagnosis Date  . BENIGN NEOPLASM OF SKIN SITE UNSPECIFIED 09/08/2008  . CEREBROVASCULAR DISEASE 12/11/2008  . DIABETES MELLITUS, TYPE II 10/12/2006  . Endometriosis   . Fibroids   . HYPERLIPIDEMIA 10/12/2006  . HYPERTENSION 10/12/2006  . PERIPHERAL NEUROPATHY 10/21/2006  . Stroke [facility])     Past Surgical History:  Procedure Laterality Date  . ABDOMINAL HYSTERECTOMY    . LEFT HEART CATHETERIZATION WITH CORONARY ANGIOGRAM N/A 03/24/2013   Procedure: LEFT HEART CATHETERIZATION WITH CORONARY ANGIOGRAM;  Surgeon: Troy Sine, MD;  Location: [facility] CATH LAB;  Service: Cardiovascular;  Laterality: N/A;    Social History:  reports that she has never smoked. She has never used smokeless tobacco. She reports that she does not drink alcohol or use drugs.  Allergies: Allergies  Allergen Reactions  . Tape Rash    Family History:  No family history of cancer or stroke.  Current Outpatient Medications:  .  acetaminophen (TYLENOL) 650 MG CR tablet, Take 1,300 mg by mouth daily as needed for pain. , Disp: , Rfl:  .  amLODipine (NORVASC) 10 MG  tablet, Take 1 tablet (10 mg total) by mouth daily., Disp: 90 tablet, Rfl: 1 .  aspirin EC 81 MG tablet, Take 81 mg by mouth daily., Disp: , Rfl:  .  atorvastatin (LIPITOR) 80 MG tablet, TAKE ONE TABLET BY MOUTH AT BEDTIME, Disp: 90 tablet, Rfl: 0 .  carvedilol (COREG) 25 MG tablet, Take 50 mg by mouth 2 (two) times daily., Disp: , Rfl:  .  chlorthalidone (HYGROTON) 50 MG tablet, Take 1 tablet (50 mg total) by mouth daily., Disp: 30 tablet, Rfl: 3 .  Cholecalciferol (VITAMIN D-3) 5000 units TABS, Take 1 tablet by mouth daily., Disp: , Rfl:  .  clopidogrel (PLAVIX) 75 MG tablet, Take 1 tablet (75 mg total) by mouth daily., Disp: 90 tablet, Rfl: 3 .  diphenoxylate-atropine (LOMOTIL) 2.5-0.025 MG tablet, Take 1 tablet by mouth 4 (four) times daily as needed for diarrhea or loose stools., Disp: 30 tablet, Rfl: 0 .  Evolocumab (REPATHA SURECLICK) 323 MG/ML SOAJ, Inject 140 mg into the skin every 14 (fourteen) days., Disp: 2 pen, Rfl: 12 .  ezetimibe (ZETIA) 10 MG tablet, Take 1 tablet (10 mg total) by mouth daily., Disp: 90 tablet, Rfl: 3 .  insulin aspart (NOVOLOG) 100 UNIT/ML FlexPen, 30 units prior to brunch and dinner. If blood sugar is greater than 150, add an additional 4 units, Disp: 4 pen, Rfl: 11 .  insulin detemir (LEVEMIR) 100 UNIT/ML injection, Inject 0.5 mLs (50 Units total) into the skin at bedtime., Disp: 10 mL, Rfl:  11 .  losartan (COZAAR) 100 MG tablet, TAKE 1 TABLET BY MOUTH DAILY, Disp: 90 tablet, Rfl: 3 .  Menthol, Topical Analgesic, (BLUE-EMU MAXIMUM STRENGTH) 2.5 % LIQD, Apply 1 spray topically as needed (pain)., Disp: , Rfl:  .  nitroGLYCERIN (NITROSTAT) 0.4 MG SL tablet, PLACE ONE TABLET UNDER THE TONGUE EVERY 5 MINUTES FOR 3 DOSES AS NEEDED FOR CHEST PAIN, Disp: 25 tablet, Rfl: 2 .  potassium chloride SA (K-DUR,KLOR-CON) 20 MEQ tablet, TAKE 1 TABLET BY MOUTH  DAILY, Disp: 90 tablet, Rfl: 3 .  RELION PEN NEEDLES 32G X 4 MM MISC, USE 4 TIMES DAILY, Disp: 100 each, Rfl: 6  Review  of Systems:  Constitutional: Denies fever, chills, diaphoresis, appetite change and fatigue.  HEENT: Denies photophobia, eye pain, redness, hearing loss, ear pain, congestion, sore throat, rhinorrhea, sneezing, mouth sores, trouble swallowing, neck pain, neck stiffness and tinnitus.   Respiratory: Denies SOB, DOE, cough, chest tightness,  and wheezing.   Cardiovascular: Denies chest pain, palpitations and leg swelling.  Gastrointestinal: Denies nausea, vomiting, abdominal pain, diarrhea, constipation, blood in stool and abdominal distention.  Genitourinary: Denies dysuria, urgency, frequency, hematuria, flank pain and difficulty urinating.  Endocrine: Denies: hot or cold intolerance, sweats, changes in hair or nails, polyuria, polydipsia. Musculoskeletal: Denies myalgias, back pain, joint swelling, arthralgias and gait problem.  Skin: Denies pallor, rash and wound.  Neurological: Denies dizziness, seizures, syncope, weakness, light-headedness, numbness and headaches.  Hematological: Denies adenopathy. Easy bruising, personal or family bleeding history  Psychiatric/Behavioral: Denies suicidal ideation, mood changes, confusion, nervousness, sleep disturbance and agitation    Physical Exam: Vitals:   03/19/18 0956  BP: 110/70  Pulse: 74  Temp: 98.1 F (36.7 C)  TempSrc: Oral  SpO2: 99%  Weight: 221 lb 4.8 oz (100.4 kg)    Body mass index is 36.83 kg/m.   Constitutional: NAD, calm, comfortable Eyes: PERRL, lids and conjunctivae normal ENMT: Mucous membranes are moist.  Respiratory: clear to auscultation bilaterally, no wheezing, no crackles. Normal respiratory effort. No accessory muscle use.  Cardiovascular: Regular rate and rhythm, no murmurs / rubs / gallops. No extremity edema. 2+ pedal pulses. No carotid bruits.  Abdomen: no tenderness, no masses palpated. No hepatosplenomegaly. Bowel sounds positive.  Musculoskeletal: no clubbing / cyanosis. No joint deformity upper and lower  extremities. Good ROM, no contractures. Normal muscle tone. Neurologic: CN 2-12 grossly intact. Sensation intact, DTR normal. Strength 5/5 in all 4.  Psychiatric: Normal judgment and insight. Alert and oriented x 3. Normal mood.    Impression and Plan:  Essential hypertension  -Blood pressure is well controlled on maximal doses of Norvasc, Coreg, losartan and chlorthalidone. -Given recent increase in chlorthalidone, will check basic metabolic profile today to follow renal function and electrolytes.  Type 2 diabetes mellitus with diabetic neuropathy, with long-term current use of insulin ([place]), Chronic -With recent increase in A1c from 7.9-9 after which Levemir dose was increased. -Patient states that ever since that change her fasting blood sugars have been in the 90 range, suspect her A1c will be improved when rechecked.    Patient Instructions  -It was nice seeing you today!  -Blood pressure is much improved.  -Lab work today. We will notify you when results are available.  -Schedule follow up in 3 months.       Lelon Frohlich, MD [facility]

## 2018-03-22 ENCOUNTER — Encounter: Payer: Self-pay | Admitting: Cardiovascular Disease

## 2018-03-22 ENCOUNTER — Ambulatory Visit (INDEPENDENT_AMBULATORY_CARE_PROVIDER_SITE_OTHER): Payer: Medicare Other | Admitting: Cardiovascular Disease

## 2018-03-22 ENCOUNTER — Encounter: Payer: Self-pay | Admitting: Internal Medicine

## 2018-03-22 ENCOUNTER — Other Ambulatory Visit: Payer: Self-pay

## 2018-03-22 DIAGNOSIS — I2102 ST elevation (STEMI) myocardial infarction involving left anterior descending coronary artery: Secondary | ICD-10-CM | POA: Diagnosis not present

## 2018-03-22 DIAGNOSIS — E1140 Type 2 diabetes mellitus with diabetic neuropathy, unspecified: Secondary | ICD-10-CM

## 2018-03-22 DIAGNOSIS — E785 Hyperlipidemia, unspecified: Secondary | ICD-10-CM

## 2018-03-22 DIAGNOSIS — Z794 Long term (current) use of insulin: Secondary | ICD-10-CM

## 2018-03-22 DIAGNOSIS — I2510 Atherosclerotic heart disease of native coronary artery without angina pectoris: Secondary | ICD-10-CM

## 2018-03-22 DIAGNOSIS — Z6835 Body mass index (BMI) 35.0-35.9, adult: Secondary | ICD-10-CM

## 2018-03-22 DIAGNOSIS — I10 Essential (primary) hypertension: Secondary | ICD-10-CM | POA: Diagnosis not present

## 2018-03-22 MED ORDER — EVOLOCUMAB 140 MG/ML ~~LOC~~ SOAJ: 140.0000 mg | SUBCUTANEOUS | 12 refills | Status: DC

## 2018-03-22 NOTE — Patient Instructions (Signed)
Medication Instructions:  The current medical regimen is effective;  continue present plan and medications.  If you need a refill on your cardiac medications before your next appointment, please call your pharmacy.   Lab work: CMET, LIPID in March 2020 If you have labs (blood work) drawn today and your tests are completely normal, you will receive your results only by: Marland Kitchen MyChart Message (if you have MyChart) OR . A paper copy in the mail If you have any lab test that is abnormal or we need to change your treatment, we will call you to review the results.   Follow-Up: At [facility], you and your health needs are our priority.  As part of our continuing mission to provide you with exceptional heart care, we have created designated Provider Care Teams.  These Care Teams include your primary Cardiologist (physician) and Advanced Practice Providers (APPs -  Physician Assistants and Nurse Practitioners) who all work together to provide you with the care you need, when you need it. You will need a follow up appointment in 6 months.  Please call our office 2 months in advance to schedule this appointment.  You may see [name], MD or one of the following Advanced Practice Providers on your designated Care Team: [place], [place] . Fabian Sharp, PA-C

## 2018-03-22 NOTE — Telephone Encounter (Signed)
Called pt to let them know repatha pa approved and that rx sent to [facility] at [employer]

## 2018-03-22 NOTE — Progress Notes (Signed)
Patient ID: Lynn Hobbs, female   DOB: 01/24/1961, 58 y.o.   MRN: [id number]     Primary M.D. : Dr. Bluford Kaufmann  HPI: Lynn Hobbs is a 58 y.o. female who presents to the office office for a 4 month cardiology evaluation.  Ms. Sparkman has a history of diabetes mellitus with neurologic complications,a history of prior CVA, hyperlipidemia, and peripheral neuropathy. She developed chest pain leading to her hospitalization on 03/23/2013. ECG after arrival to the hospital in the early morning showed ST elevation anteriorly. Troponin was positive for 0.86 and urgent catheterization  performed by me demonstrated a 99% stenosis in the LAD  proximal to the diagonal vessel with initial TIMI 1/2 flow. She had a diffusely diseased distal LAD with 50-60% stenosis, and 40-50% narrowings in the first diagonal vessel. The circumflex vessel had mild irregularities. The RCA was diffusely diseased with 40% near ostial narrowing, diffuse 70-80% mid stenoses, 80% stenosis before the acute margin, and 60% distal stenosis.  She underwent insertion of a 3.0 x18 mm Xience Alpine stent postdilated 3.25 mm  in her mid LAD.  She was discharged on 03/27/2013. She was sent home with a life-vest.   During the hospital her LDL cholesterol was 184. Hemoglobin A1c was 8.1. A 2-D echo Doppler study pre-discharge following her initial event showed an ejection fraction of 30-35% and for this reason a life vest was initially recommended with plans for a followup echo in 3 months.  A followup echo Doppler study on June 14, 2013 showed improved LV function with ejection fraction increasing to 45-50%.  She did have left ventricular hypertrophy.  There was akinesis of the apical myocardium.  Her LifeVest was discontinued.  Followup blood work on May 25, 2013 showed markedly improved lipid status on her atorvastatin 80 mg with her total cholesterol improving from 259 to150 and your LDL cholesterol from 184 to 86.  HDL was 50,  triglycerides 68.  She underwent a nuclear perfusion study 04/22/2016.  This showed an ejection fraction of 55%.  There was a defect that was nonreversible and consistent with prior infarct in the anteroseptal mid inferior, apical anterior, apical septal, apical inferior and apical lateral wall.  There was no ischemia.    When Isaw her in November 2018 her blood pressure was elevated and I suggested that she try alternating chlorthalidone 25 mg with 12.5 mg every other day.  She was on amlodipine 10 mg, carvedilol 37.5 mg twice a day, losartan 100 mg daily.  Over the past 6 months, she denies any episodes of chest pain or palpitations.  Denies PND orthopnea.  Lab work on July 06, 2017 showed increased glucose of 246.  Creatinine was 1.34.  Her cholesterol was slightly improved with with the addition of Zetia 10 mg to atorvastatin 80 mg which was started in February 2019after her LDL cholesterol was 147.    I  saw her in May 2019.  Blood pressure was improved on her regimen consisting of amlodipine 10 mg carvedilol 50 mg twice a day, chlorthalidone 12.5 mg and losartan 100 mg daily.  Zetia 10 mg was added to atorvastatin 80 mg and repeat lab work showed improvement of LDL but this was still elevated at 122.  She substernally had follow-up laboratory in November 03, 2017 which continues to show improvement but LDL, cholesterol continues to be elevated at 102.  Total cholesterol was 181.  She continues to be on aspirin and Plavix for antiplatelet benefit.    When  I last saw her in September 2019, I recommended further titration of chlorthalidone to 25 mg daily with continued mild blood pressure elevation based on new hypertensive guidelines.  With her persistent LDL increase at 2 despite taking atorvastatin 80 mg and Zetia 10 mg, I initiated the process for Repatha.  She received her first dose in the office.  I had a lengthy discussion with her regarding the 4-year trial.  She has been on Repatha since mid  September.  Subsequent lipid studies on January 17, 2020 2019 showed significant improvement with total cholesterol 150, HDL 63, LDL 65, and triglycerides of 109 after only approximately 4 doses.  Past several months, she has continued to feel well.  She tells me Jerilee Hoh further increased her chlorthalidone to 50 mg.  She denies recurrent chest pain PND orthopnea.  She is tolerating Repatha injections every 2 weeks subcu in her abdomen.  She denies PND orthopnea.  She presents for reevaluation.  Past Medical History:  Diagnosis Date  . BENIGN NEOPLASM OF SKIN SITE UNSPECIFIED 09/08/2008  . CEREBROVASCULAR DISEASE 12/11/2008  . DIABETES MELLITUS, TYPE II 10/12/2006  . Endometriosis   . Fibroids   . HYPERLIPIDEMIA 10/12/2006  . HYPERTENSION 10/12/2006  . PERIPHERAL NEUROPATHY 10/21/2006  . Stroke [facility])     Past Surgical History:  Procedure Laterality Date  . ABDOMINAL HYSTERECTOMY    . LEFT HEART CATHETERIZATION WITH CORONARY ANGIOGRAM N/A 03/24/2013   Procedure: LEFT HEART CATHETERIZATION WITH CORONARY ANGIOGRAM;  Surgeon: Troy Sine, MD;  Location: [facility] C[facility] LAB;  Service: Cardiovascular;  Laterality: N/A;    Allergies  Allergen Reactions  . Tape Rash    Current Outpatient Medications  Medication Sig Dispense Refill  . acetaminophen (TYLENOL) 650 MG CR tablet Take 1,300 mg by mouth daily as needed for pain.     Marland Kitchen amLODipine (NORVASC) 10 MG tablet Take 1 tablet (10 mg total) by mouth daily. 90 tablet 1  . aspirin EC 81 MG tablet Take 81 mg by mouth daily.    Marland Kitchen atorvastatin (LIPITOR) 80 MG tablet TAKE ONE TABLET BY MOUTH AT BEDTIME 90 tablet 0  . carvedilol (COREG) 25 MG tablet Take 50 mg by mouth 2 (two) times daily.    . chlorthalidone (HYGROTON) 50 MG tablet Take 1 tablet (50 mg total) by mouth daily. 30 tablet 3  . Cholecalciferol (VITAMIN D-3) 5000 units TABS Take 1 tablet by mouth daily.    . clopidogrel (PLAVIX) 75 MG tablet Take 1 tablet (75 mg total) by mouth daily. 90 tablet 3    . diphenoxylate-atropine (LOMOTIL) 2.5-0.025 MG tablet Take 1 tablet by mouth 4 (four) times daily as needed for diarrhea or loose stools. 30 tablet 0  . Evolocumab (REPATHA SURECLICK) 701 MG/ML SOAJ Inject 140 mg into the skin every 14 (fourteen) days. 2 pen 12  . ezetimibe (ZETIA) 10 MG tablet Take 1 tablet (10 mg total) by mouth daily. 90 tablet 3  . insulin aspart (NOVOLOG) 100 UNIT/ML FlexPen 30 units prior to brunch and dinner. If blood sugar is greater than 150, add an additional 4 units 4 pen 11  . insulin detemir (LEVEMIR) 100 UNIT/ML injection Inject 0.5 mLs (50 Units total) into the skin at bedtime. 10 mL 11  . losartan (COZAAR) 100 MG tablet TAKE 1 TABLET BY MOUTH DAILY 90 tablet 3  . Menthol, Topical Analgesic, (BLUE-EMU MAXIMUM STRENGTH) 2.5 % LIQD Apply 1 spray topically as needed (pain).    . nitroGLYCERIN (NITROSTAT) 0.4 MG SL tablet  PLACE ONE TABLET UNDER THE TONGUE EVERY 5 MINUTES FOR 3 DOSES AS NEEDED FOR CHEST PAIN 25 tablet 2  . potassium chloride SA (K-DUR,KLOR-CON) 20 MEQ tablet TAKE 1 TABLET BY MOUTH  DAILY 90 tablet 3  . RELION PEN NEEDLES 32G X 4 MM MISC USE 4 TIMES DAILY 100 each 6   No current facility-administered medications for this visit.     Social History   Socioeconomic History  . Marital status: Single    Spouse name: Not on file  . Number of children: Not on file  . Years of education: Not on file  . Highest education level: Not on file  Occupational History  . Not on file  Social Needs  . Financial resource strain: Not on file  . Food insecurity:    Worry: Not on file    Inability: Not on file  . Transportation needs:    Medical: Not on file    Non-medical: Not on file  Tobacco Use  . Smoking status: Never Smoker  . Smokeless tobacco: Never Used  Substance and Sexual Activity  . Alcohol use: No  . Drug use: No  . Sexual activity: Not on file  Lifestyle  . Physical activity:    Days per week: Not on file    Minutes per session: Not on  file  . Stress: Not on file  Relationships  . Social connections:    Talks on phone: Not on file    Gets together: Not on file    Attends religious service: Not on file    Active member of club or organization: Not on file    Attends meetings of clubs or organizations: Not on file    Relationship status: Not on file  . Intimate partner violence:    Fear of current or ex partner: Not on file    Emotionally abused: Not on file    Physically abused: Not on file    Forced sexual activity: Not on file  Other Topics Concern  . Not on file  Social History Narrative   Regular exercise: no   Caffeine use: ocassionally    No family history on file.  ROS General: Negative; No fevers, chills, or night sweats;  HEENT: Negative; No changes in vision or hearing, sinus congestion, difficulty swallowing Pulmonary: Negative; No cough, wheezing, shortness of breath, hemoptysis Cardiovascular: See history of present illness; No chest pain, presyncope, syncope, palpitations GI: Negative; No nausea, vomiting, diarrhea, or abdominal pain GU: Negative; No dysuria, hematuria, or difficulty voiding Musculoskeletal: Positive for leg spasms. Hematologic/Oncology: Negative; no easy bruising, bleeding Endocrine: Positive for diabetes; no heat/cold intolerance; Neuro: Negative; no changes in balance, headaches Skin: Negative; No rashes or skin lesions Psychiatric: Negative; No behavioral problems, depression Sleep: Negative; No snoring, daytime sleepiness, hypersomnolence, bruxism, restless legs, hypnogognic hallucinations, no cataplexy Other comprehensive 14 point system review is negative.  PE BP 122/72   Pulse 76   Ht '5\' 6"'  (1.676 m)   Wt 223 lb 6.4 oz (101.3 kg)   BMI 36.06 kg/m    Repeat blood pressure was significantly improved from last evaluation at 118/70.  Wt Readings from Last 3 Encounters:  03/22/18 223 lb 6.4 oz (101.3 kg)  03/19/18 221 lb 4.8 oz (100.4 kg)  02/02/18 223 lb 1.6 oz  (101.2 kg)   General: Alert, oriented, no distress.  Skin: normal turgor, no rashes, warm and dry HEENT: Normocephalic, atraumatic. Pupils equal round and reactive to light; sclera anicteric; extraocular muscles intact;  Nose without nasal septal hypertrophy Mouth/Parynx benign; Mallinpatti scale 3 Neck: No JVD, no carotid bruits; normal carotid upstroke Lungs: clear to ausculatation and percussion; no wheezing or rales Chest wall: without tenderness to palpitation Heart: PMI not displaced, RRR, s1 s2 normal, 1/6 systolic murmur, no diastolic murmur, no rubs, gallops, thrills, or heaves Abdomen: Central adiposity; soft, nontender; no hepatosplenomehaly, BS+; abdominal aorta nontender and not dilated by palpation. Back: no CVA tenderness Pulses 2+ Musculoskeletal: full range of motion, normal strength, no joint deformities Extremities: no clubbing cyanosis or edema, Homan's sign negative  Neurologic: grossly nonfocal; Cranial nerves grossly wnl Psychologic: Normal mood and affect   ECG (independently read by me): Normal sinus rhythm at 76 bpm.  Inferior lateral Q waves.  QTc interval 461 ms.  PR interval 178 ms.  No ectopy.  September 2019 ECG (independently read by me): Normal sinus rhythm at 75 bpm.  Inferior Q waves, Q waves V3 - 5.  QTc interval 444 ms  May 2019 ECG (independently read by me): Normal sinus rhythm at 76 bpm.  Small inferior Q waves.  Poor anterior R wave progression.  QTc interval 468 ms.  November 2018 ECG (independently read by me): Normal sinus rhythm at 77 bpm.  Small inferior Q waves.  Q waves V3 through V6 concordant with old anterolateral infarct.  June 2017 ECG (independently read by me):  Normal sinus rhythm at 77 bpm. Q waves in lead 3 and aVF.  Q waves V3 through V6 concordant with anterolateral infarct.  No ST segment changes.  Intervals normal.  ECG (independently read by me): Normal sinus rhythm at 73 bpm.  Evidence for prior anterolateral MI.  Left axis  deviation.  Inferior Q waves in 3 and aVF.  February 2016 ECG (independently read by me): Normal sinus rhythm at 79 bpm.  Inferior Q waves in lead III and F.  Poor anterior R-wave progression.  August 2015 ECG (independently read by me): Sinus rhythm at 73 beats per minute.  Probably a progression anteriorly, concordant with her prior MI. QTc interval 449 ms  06/30/2013 ECG today (independently read by me): Sinus rhythm at 79 beats per minute.  Poor with progression anteriorly.  Prior ECG (independently read by me): Normal sinus rhythm at 86 beats per minute anterior Q waves with her MI. QTc interval 459 ms   LABS:  BMP Latest Ref Rng & Units 03/19/2018 11/03/2017 07/06/2017  Glucose 70 - 99 mg/dL 248(H) 243(H) 246(H)  BUN 6 - 23 mg/dL 23 26(H) 25(H)  Creatinine 0.40 - 1.20 mg/dL 1.35(H) 1.31(H) 1.34(H)  BUN/Creat Ratio 9 - 23 - 20 19  Sodium 135 - 145 mEq/L 138 140 140  Potassium 3.5 - 5.1 mEq/L 4.6 3.9 4.4  Chloride 96 - 112 mEq/L 98 99 101  CO2 19 - 32 mEq/L '30 24 24  ' Calcium 8.4 - 10.5 mg/dL 10.2 10.4(H) 9.8    Hepatic Function Latest Ref Rng & Units 11/03/2017 07/06/2017 11/13/2016  Total Protein 6.0 - 8.5 g/dL 8.2 8.0 8.0  Albumin 3.5 - 5.5 g/dL 4.6 4.5 4.0  AST 0 - 40 IU/L '17 17 23  ' ALT 0 - 32 IU/L 20 22 33  Alk Phosphatase 39 - 117 IU/L 61 62 41  Total Bilirubin 0.0 - 1.2 mg/dL 0.3 0.4 0.8  Bilirubin, Direct 0.0 - 0.3 mg/dL - - -    CBC Latest Ref Rng & Units 04/06/2017 11/13/2016 11/13/2016  WBC 3.4 - 10.8 x10E3/uL 7.6 - 7.3  Hemoglobin 11.1 -  15.9 g/dL 11.7 12.6 11.6(L)  Hematocrit 34.0 - 46.6 % 35.1 37.0 36.0  Platelets 150 - 379 x10E3/uL 221 - 190    No results found for: PROBNP  Lipid Panel     Component Value Date/Time   CHOL 150 01/28/2018 1029   TRIG 109 01/28/2018 1029   HDL 63 01/28/2018 1029   CHOLHDL 3.1 11/03/2017 0918   CHOLHDL 2.6 09/12/2015 0944   VLDL 21 09/12/2015 0944   LDLCALC 65 01/28/2018 1029     RADIOLOGY: No results  found.  IMPRESSION:  1. History of ST elevation myocardial infarction involving left anterior descending (LAD) coronary artery ([place]); 03/24/2013: DES stent to LAD   2. CAD in native artery   3. Essential hypertension   4. Hyperlipidemia with target LDL less than 70   5. Class 2 severe obesity due to excess calories with serious comorbidity and body mass index (BMI) of 35.0 to 35.9 in adult ([place])   6. Type 2 diabetes mellitus with diabetic neuropathy, with long-term current use of insulin ([facility])     ASSESSMENT AND PLAN: Ms. [name] [name] is a 58 year-old African-American female who suffered an ST segment elevation myocardial infarction and presented urgently to the catheterization laboratory in the morning of 03/24/2013. She was found to have subtotal 99% LAD stenosis which was successfully stented with a DES stent.   She initially had an ischemic cardiomyopathy and required an initial LifeVest, but with significant improvement in LV function the LifeVest was subsequently discontinued.  Since her acute coronary event she has been stable without recurrent anginal symptoms.  Over the past several months he has been on an increased antihypertensive regimen now consisting of amlodipine 10 mg, carvedilol 50 mg twice a day, chlorthalidone 50 mg daily, and losartan 100 mg.  Her blood pressure today is excellent and on repeat by me was 118/70.  She has significant hyperlipidemia and continue to have persistent LDL elevation despite atorvastatin 80 mg and Zetia 10 mg.  Over the past several months she has been on Repatha and is tolerating this.  Studies have improved after only 4 injections.  Reference to her CAD, she is not having any recurrent anginal symptomatology.  Continues to be on dual antiplatelet therapy with aspirin and Plavix.  She is diabetic on insulin.  She is moderately obese with a BMI of 36.06.  In March she will undergo follow-up chemistry and lipid studies.  She will continue her current medical  regimen.  I will see her in 6 months for reevaluation or sooner if problems arise.  Time spent: 48mnutes  TTroy Sine MD, F[facility] 03/22/2018 10:54 AM

## 2018-04-20 ENCOUNTER — Other Ambulatory Visit: Payer: Self-pay | Admitting: Internal Medicine

## 2018-04-20 DIAGNOSIS — I10 Essential (primary) hypertension: Secondary | ICD-10-CM

## 2018-04-20 MED ORDER — CHLORTHALIDONE 50 MG PO TABS: 50.0000 mg | ORAL_TABLET | Freq: Every day | ORAL | 0 refills | Status: DC

## 2018-04-20 NOTE — Telephone Encounter (Signed)
Copied from [place] [id number]. Topic: Quick Communication - Rx Refill/Question >> Apr 20, 2018  3:19 PM Selinda Flavin B, NT wrote: Medication: chlorthalidone (HYGROTON) 50 MG tablet  Has the patient contacted their pharmacy? Yes.   (Agent: If no, request that the patient contact the pharmacy for the refill.) (Agent: If yes, when and what did the pharmacy advise?)  Preferred Pharmacy (with phone number or street name): [facility], [place] - [address]  Agent: Please be advised that RX refills may take up to 3 business days. We ask that you follow-up with your pharmacy.

## 2018-04-22 ENCOUNTER — Encounter (INDEPENDENT_AMBULATORY_CARE_PROVIDER_SITE_OTHER): Payer: Medicare Other | Admitting: Ophthalmology

## 2018-04-22 DIAGNOSIS — H43813 Vitreous degeneration, bilateral: Secondary | ICD-10-CM

## 2018-04-22 DIAGNOSIS — E10319 Type 1 diabetes mellitus with unspecified diabetic retinopathy without macular edema: Secondary | ICD-10-CM

## 2018-04-22 DIAGNOSIS — H2513 Age-related nuclear cataract, bilateral: Secondary | ICD-10-CM | POA: Diagnosis not present

## 2018-04-22 DIAGNOSIS — I10 Essential (primary) hypertension: Secondary | ICD-10-CM

## 2018-04-22 DIAGNOSIS — E103593 Type 1 diabetes mellitus with proliferative diabetic retinopathy without macular edema, bilateral: Secondary | ICD-10-CM

## 2018-04-22 DIAGNOSIS — H35033 Hypertensive retinopathy, bilateral: Secondary | ICD-10-CM

## 2018-04-22 LAB — HM DIABETES EYE EXAM

## 2018-04-28 ENCOUNTER — Other Ambulatory Visit: Payer: Self-pay | Admitting: Pharmacist

## 2018-04-28 MED ORDER — EVOLOCUMAB 140 MG/ML ~~LOC~~ SOAJ: 140.0000 mg | SUBCUTANEOUS | 11 refills | Status: DC

## 2018-04-28 NOTE — Telephone Encounter (Signed)
[facility] unable to get new NDC repatha at the moment. Patient request Rx sent to [facility].  Sample #1 also provided today until Rx processed.  Repatha SureClick 140mg  given to the patient, quantity 1, Lot Number

## 2018-04-29 ENCOUNTER — Encounter: Payer: Self-pay | Admitting: Internal Medicine

## 2018-07-06 ENCOUNTER — Other Ambulatory Visit: Payer: Self-pay | Admitting: *Deleted

## 2018-07-06 MED ORDER — CARVEDILOL 25 MG PO TABS: 50.0000 mg | ORAL_TABLET | Freq: Two times a day (BID) | ORAL | 11 refills | Status: DC

## 2018-07-14 ENCOUNTER — Other Ambulatory Visit: Payer: Self-pay | Admitting: *Deleted

## 2018-07-14 MED ORDER — INSULIN ASPART 100 UNIT/ML FLEXPEN: PEN_INJECTOR | SUBCUTANEOUS | 3 refills | Status: DC

## 2018-07-26 IMAGING — CT CT HEAD W/O CM
3 of 4 series · 13 of 47 positions shown, 15 images · non-contrast
Comparison: None.

CLINICAL DATA: Pt had procedure to left eye yesterday and did not
take blood pressure medicine, woke up at 3am with unsteady gait and
strokes. Focal neurological deficits greater than 6 hours.

EXAM:
CT HEAD WITHOUT CONTRAST
TECHNIQUE: Contiguous axial images were obtained from the base of the skull
through the vertex without intravenous contrast.

[Series 3: head wo · axial · 0.44mm/px · z∈[-103,+37]mm · 7 of 38 slices shown, 9 images]
[im 5/38  brain]
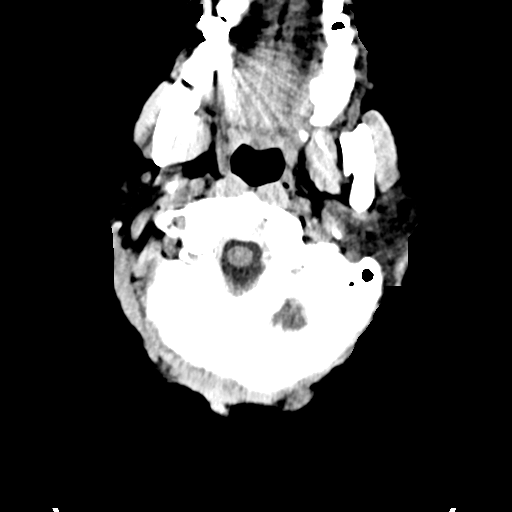
[im 5/38  bone]
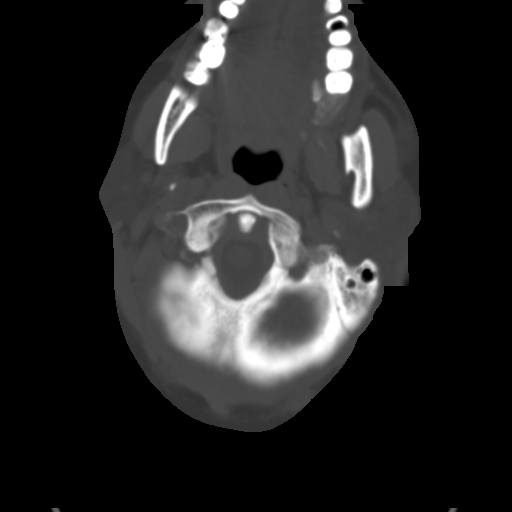
[im 10/38  brain]
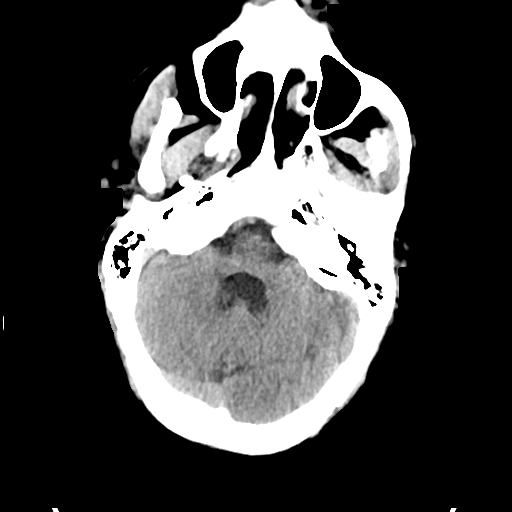
[im 14/38  brain]
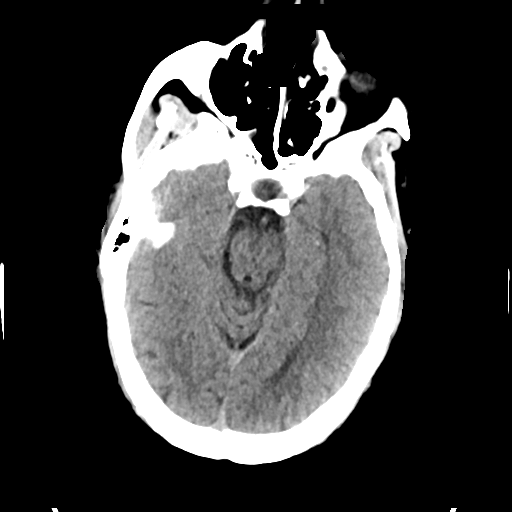
[im 19/38  brain]
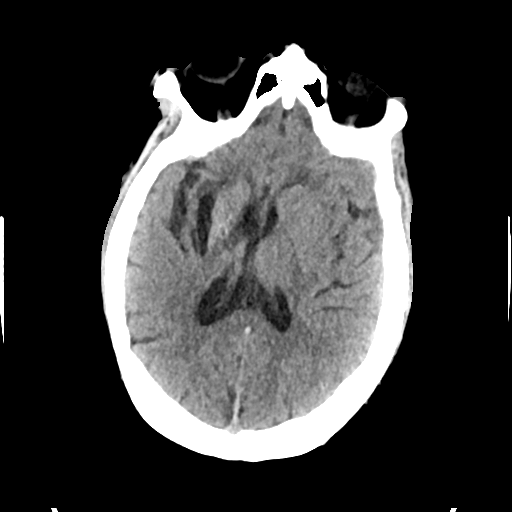
[im 24/38  brain]
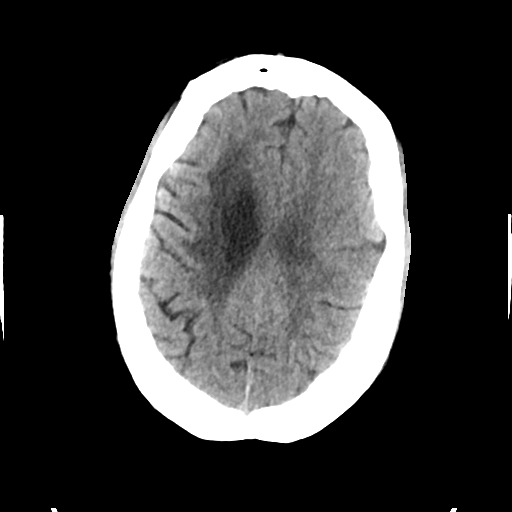
[im 24/38  bone]
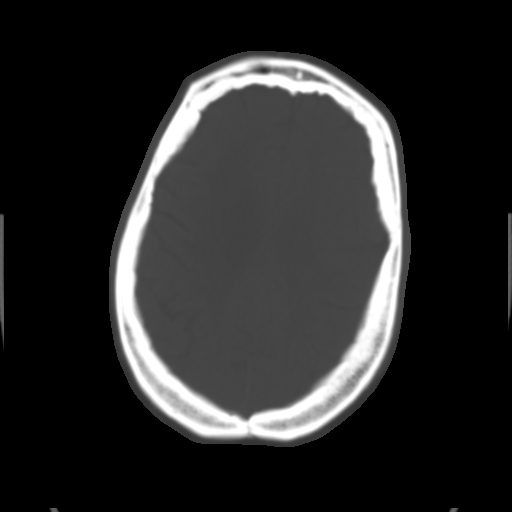
[im 28/38  brain]
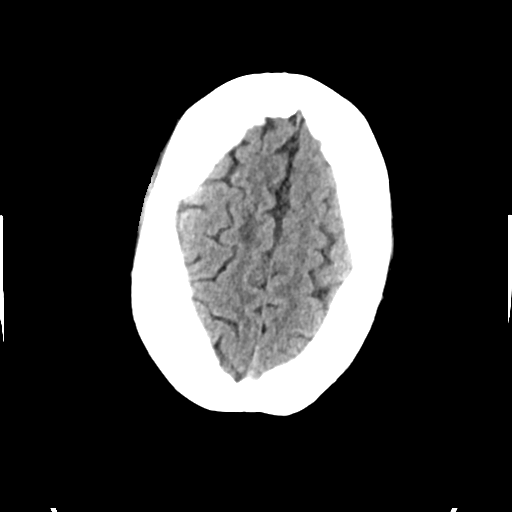
[im 33/38  brain]
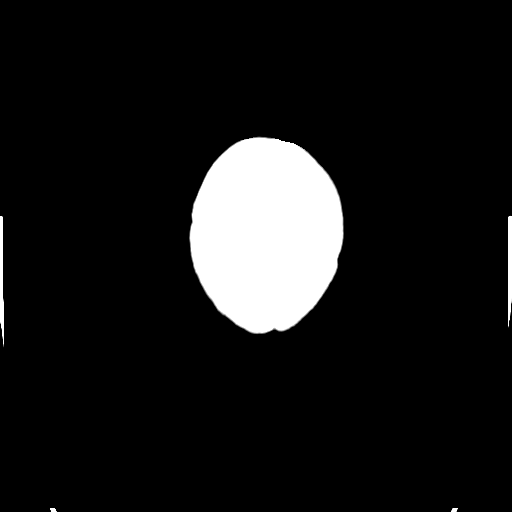

[Series 5: cor soft · coronal · 0.36mm/px · 3 of 73 slices shown]
[im 25/73  brain]
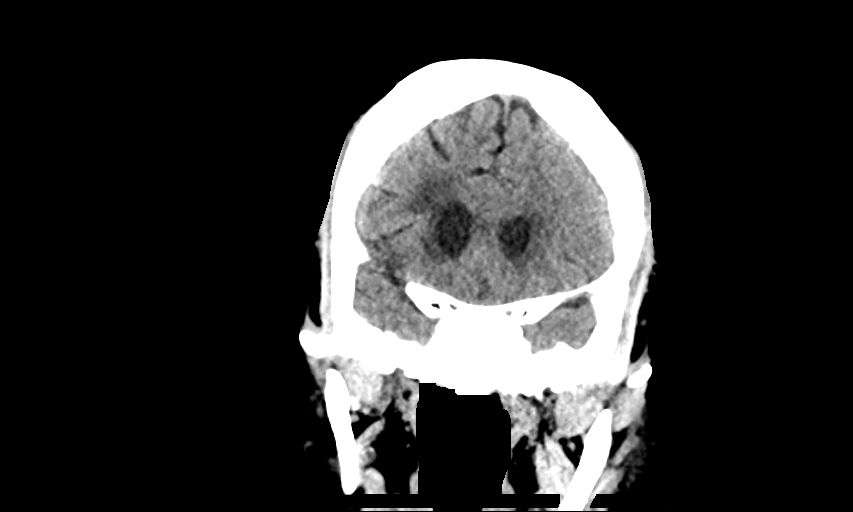
[im 33/73  brain]
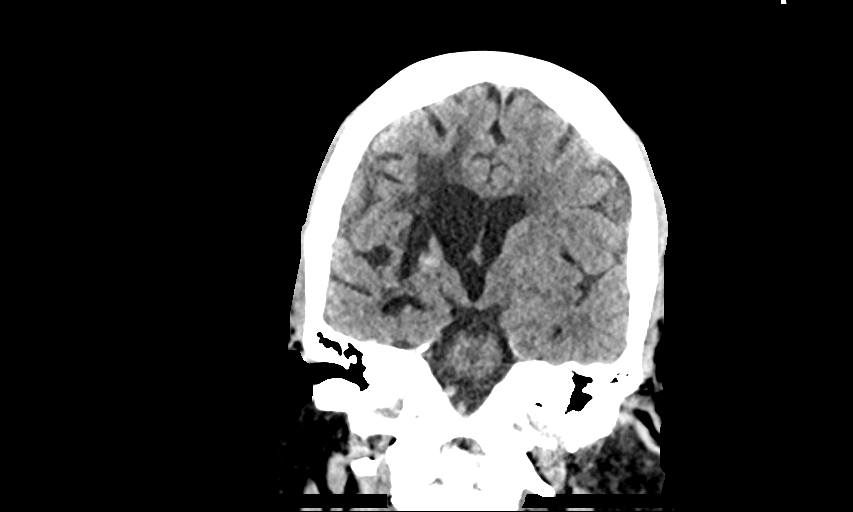
[im 41/73  brain]
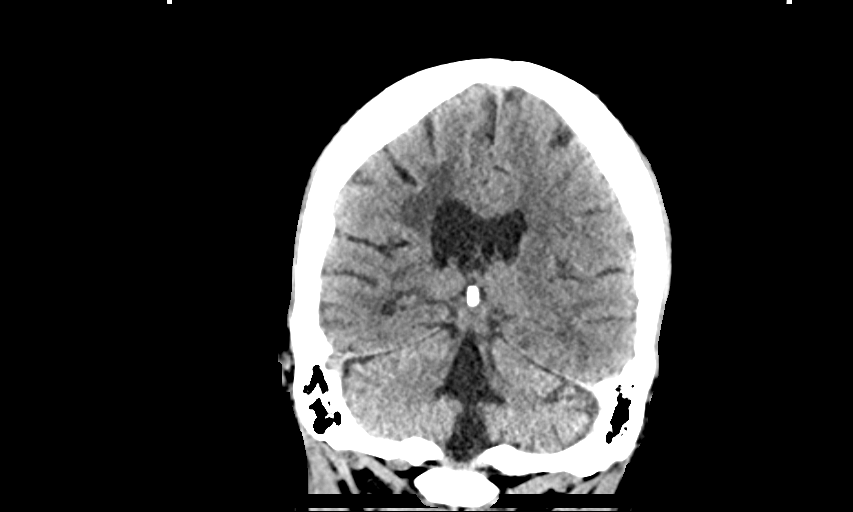

[Series 6: sag soft · sagittal · 0.36mm/px · 3 of 52 slices shown]
[im 18/52  brain]
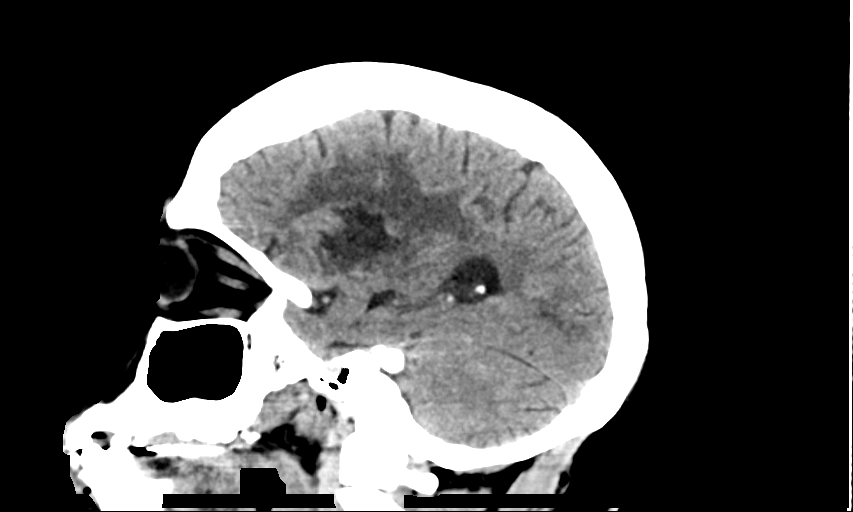
[im 26/52  brain]
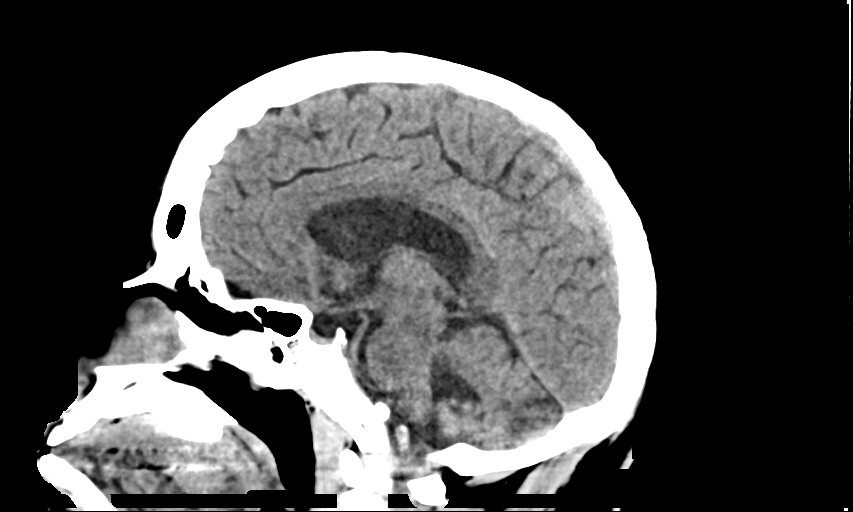
[im 35/52  brain]
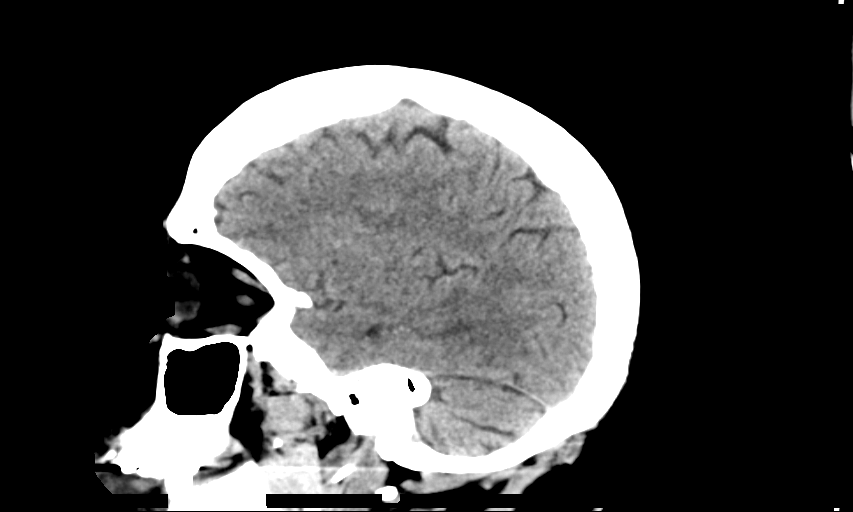

[13 of 47 positions shown; findings below may reference images not displayed]

FINDINGS: Brain: Areas of low-attenuation and encephalomalacia demonstrated in
the right centrum semiovale, right basal ganglia, and extending into
the right temporal lobe centrally. This is consistent with old
infarct. The area of abnormality appears to be larger than on the
previous study, suggesting possible acute progression. Consider MR
for further evaluation. Patchy low-attenuation changes in the deep
white matter bilaterally consistent with chronic small vessel
ischemia. Mild asymmetric ventricular dilatation on the right
consistent with volume loss from old stroke. Old left cerebellar
infarct inferiorly. No abnormal extra-axial fluid collections.
Gray-white matter junctions are distinct. Basal cisterns are not
effaced. No acute intracranial hemorrhage.

Vascular: No hyperdense vessel or unexpected calcification.

Skull: Normal. Negative for fracture or focal lesion.

Sinuses/Orbits: No acute finding.

Other: None.
IMPRESSION: Old infarcts in the right cerebral hemisphere and left inferior
cerebellum. Patchy small vessel ischemic changes in the deep white
matter. Possible acute progression of ischemia on the right.
Consider MRI for further evaluation. No acute intracranial
hemorrhage.

## 2018-07-26 IMAGING — MR MR HEAD W/O CM
9 of 10 series · 38 of 48 positions shown · non-contrast
Comparison: CT 11/13/2016, MRI 04/14/2014

CLINICAL DATA: Focal neuro deficit less than 6 hours, stroke
suspected

EXAM:
MRI HEAD WITHOUT CONTRAST
TECHNIQUE: Multiplanar, multiecho pulse sequences of the brain and surrounding
structures were obtained without intravenous contrast.

[Series 3: DWI · axial · 3.0mm · 1.09mm/px · z∈[-35,+97]mm · 11 of 90 slices shown (1 of 4)]
[im 1/90]
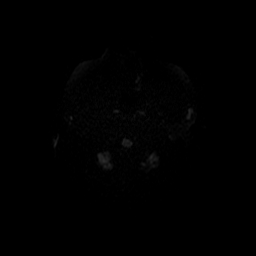
[im 9/90]
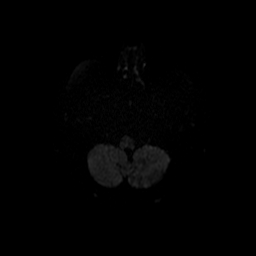
[im 18/90]
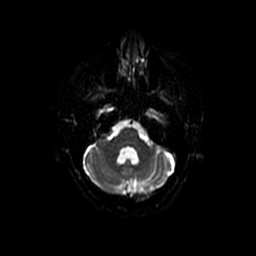
[im 27/90]
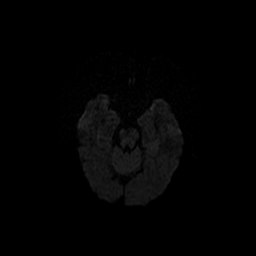
[im 36/90]
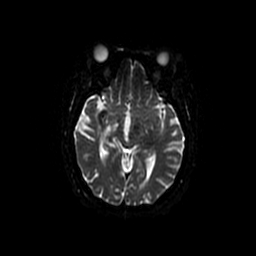
[im 45/90]
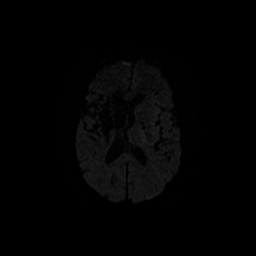
[im 54/90]
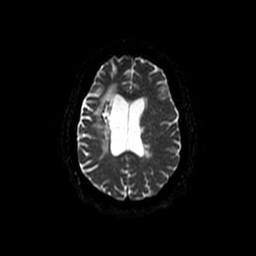
[im 63/90]
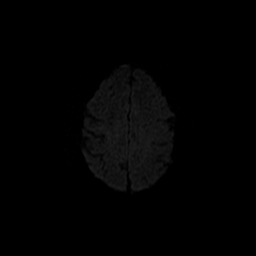
[im 72/90]
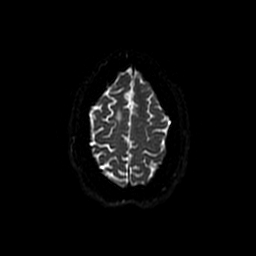
[im 81/90]
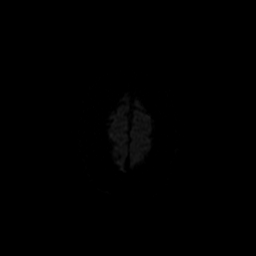
[im 90/90]
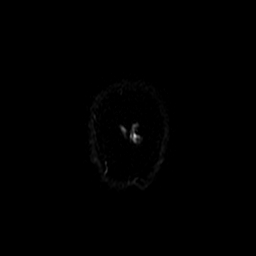

[Series 4: DWI · coronal · 5.0mm · 1.09mm/px · 7 of 68 slices shown (2 of 4)]
[im 1/68]
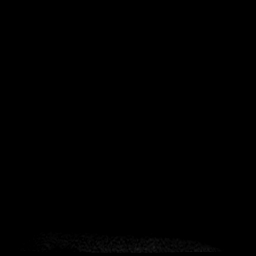
[im 12/68]
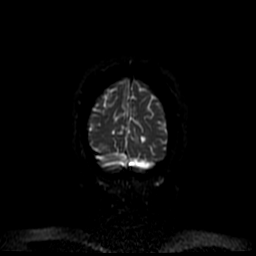
[im 23/68]
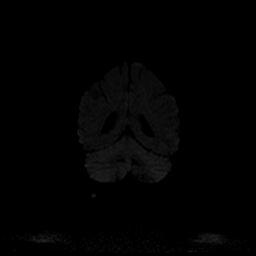
[im 34/68]
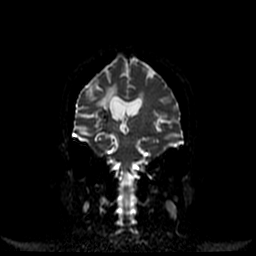
[im 45/68]
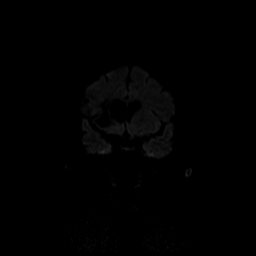
[im 56/68]
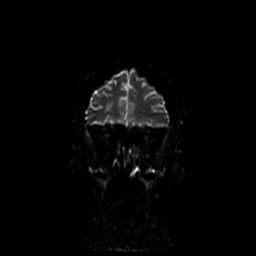
[im 68/68]
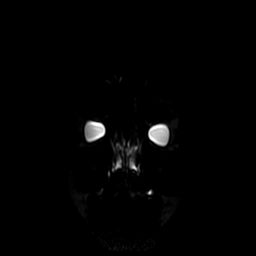

[Series 5: T1 · sagittal · 5.0mm · 0.47mm/px · 2 of 22 slices shown]
[im 1/22]
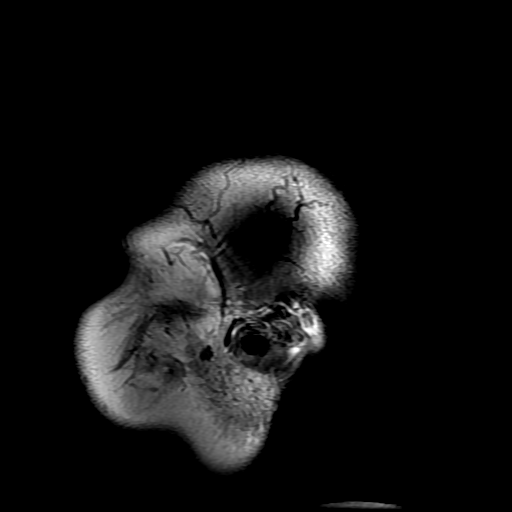
[im 22/22]
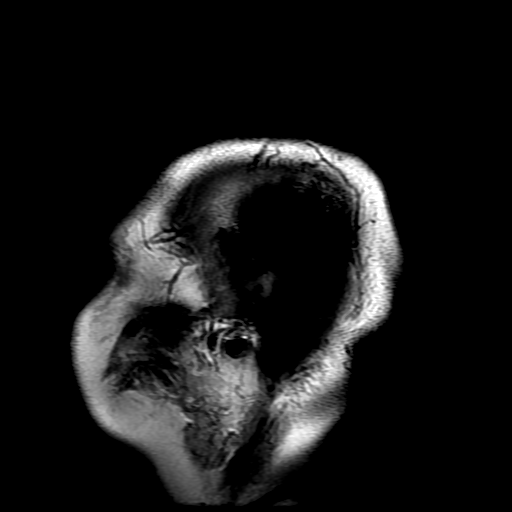

[Series 6: T2 · axial · 5.0mm · 0.43mm/px · z∈[-37,+95]mm · 2 of 23 slices shown (1 of 2)]
[im 1/23]
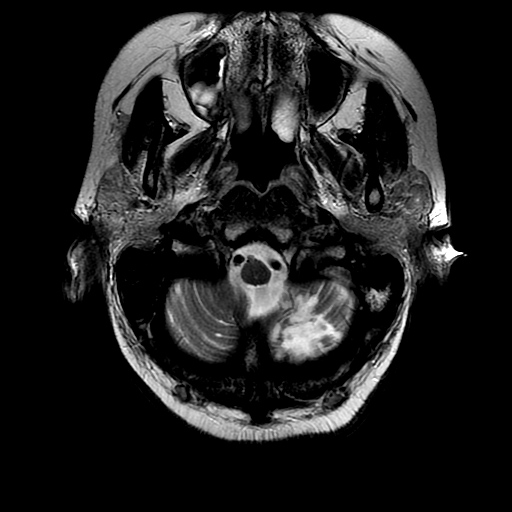
[im 23/23]
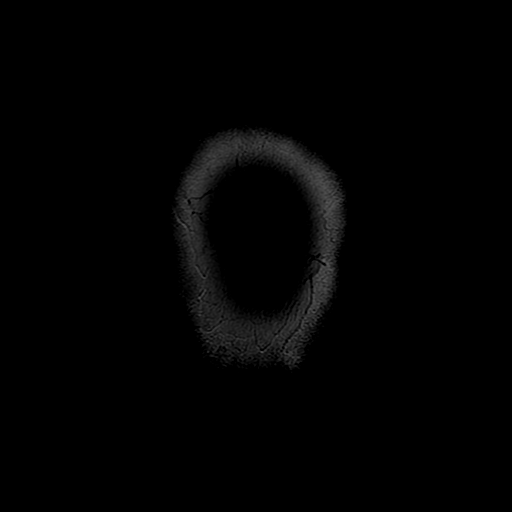

[Series 7: FLAIR · axial · 5.0mm · 0.43mm/px · z∈[-37,+95]mm · 2 of 23 slices shown]
[im 1/23]
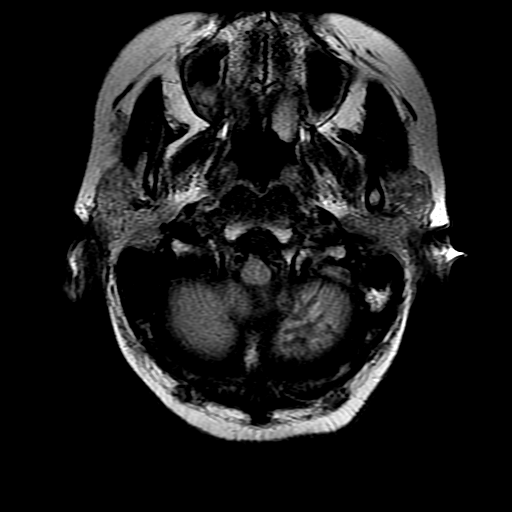
[im 23/23]
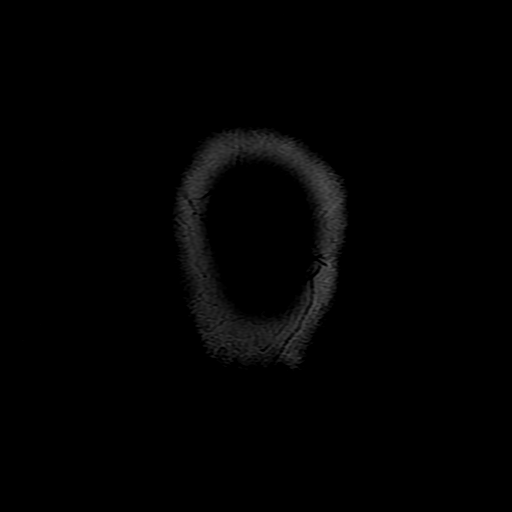

[Series 8: ax mpgr · axial · 5.0mm · 0.43mm/px · z∈[-37,+95]mm · 2 of 23 slices shown]
[im 1/23]
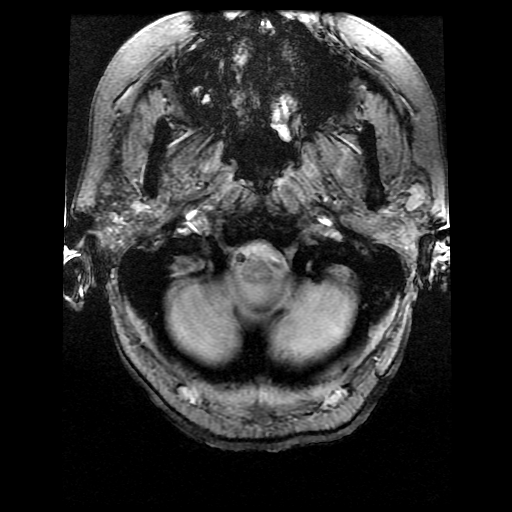
[im 23/23]
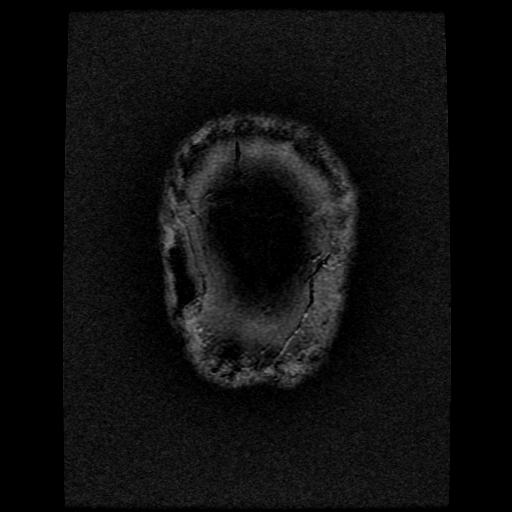

[Series 10: T2 · coronal · 5.0mm · 0.39mm/px · 3 of 26 slices shown (2 of 2)]
[im 1/26]
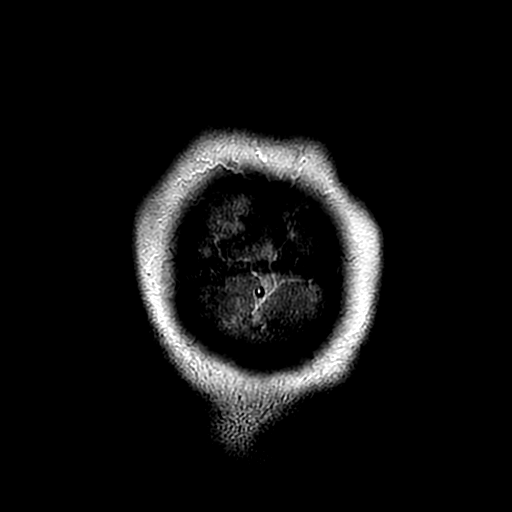
[im 13/26]
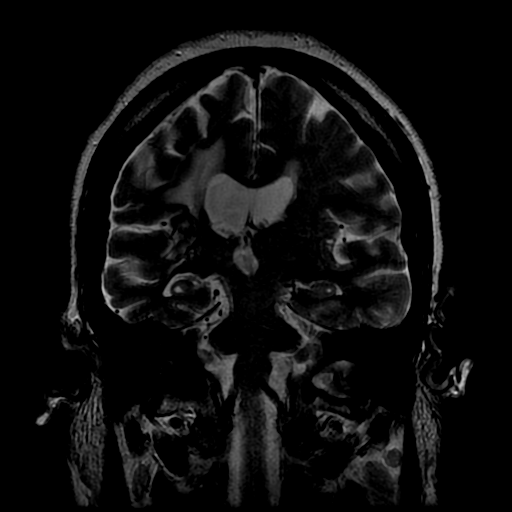
[im 26/26]
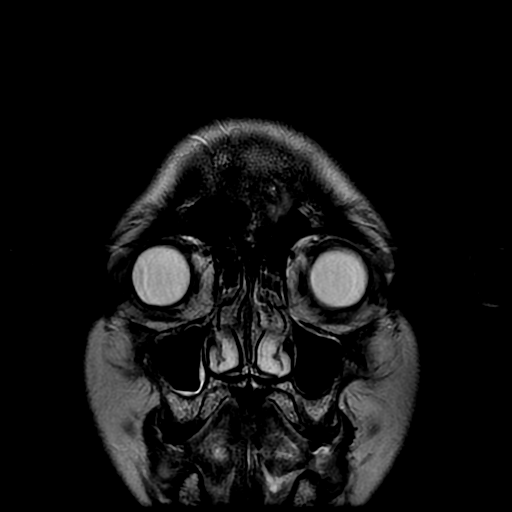

[Series 300: DWI · axial · 3.0mm · 1.09mm/px · z∈[-35,+97]mm · 5 of 45 slices shown (3 of 4)]
[im 1/45]
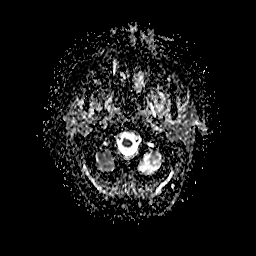
[im 12/45]
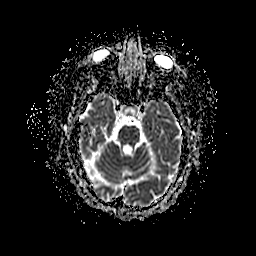
[im 23/45]
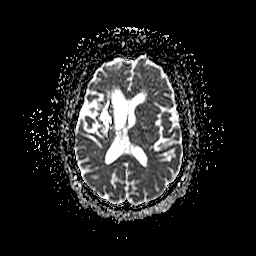
[im 34/45]
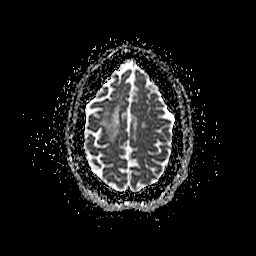
[im 45/45]
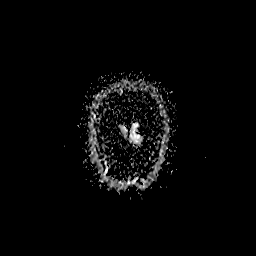

[Series 400: DWI · coronal · 5.0mm · 1.09mm/px · 4 of 34 slices shown (4 of 4)]
[im 1/34]
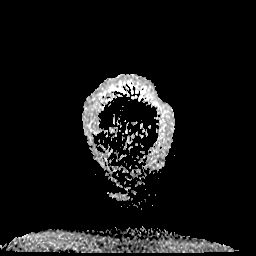
[im 12/34]
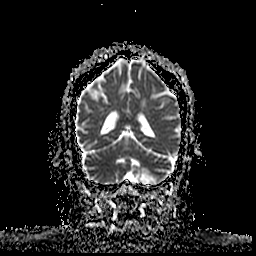
[im 23/34]
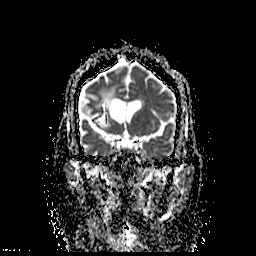
[im 34/34]
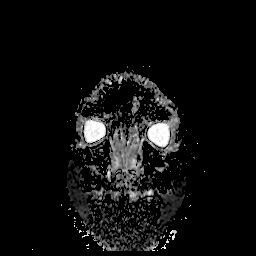

[38 of 48 positions shown; findings below may reference images not displayed]

FINDINGS: Brain: Negative for acute infarct.

Chronic hemorrhagic infarction in the right lateral basal ganglia
unchanged. Hyperintensity in the deep white matter right greater
than left unchanged. Chronic infarct left PICA territory unchanged.
Negative for mass or hydrocephalus

Vascular: Normal arterial flow voids

Skull and upper cervical spine: Negative

Sinuses/Orbits: Mild mucosal edema paranasal sinuses. Negative orbit

Other: None
IMPRESSION: No acute intracranial abnormality.

Chronic hemorrhagic infarction right basal ganglia unchanged.

## 2018-09-17 ENCOUNTER — Telehealth: Payer: Self-pay | Admitting: *Deleted

## 2018-09-17 NOTE — Telephone Encounter (Signed)
Attempted to call patient at home number, but no answer.

## 2018-09-17 NOTE — Telephone Encounter (Signed)
Copied from [place] #[id number]. Topic: General - Other >> Sep 17, 2018 12:17 PM Virl Axe D wrote: Reason for CRM: Pt returned vm for screening questions for ov on 09/21/18. No answer on FC line x3. Please return call to home phone [phone]

## 2018-09-21 ENCOUNTER — Ambulatory Visit (INDEPENDENT_AMBULATORY_CARE_PROVIDER_SITE_OTHER): Payer: Medicare Other | Admitting: Internal Medicine

## 2018-09-21 ENCOUNTER — Other Ambulatory Visit: Payer: Self-pay

## 2018-09-21 ENCOUNTER — Encounter: Payer: Self-pay | Admitting: Internal Medicine

## 2018-09-21 VITALS — BP 120/78 | HR 83 | Temp 98.7°F | Ht 65.75 in | Wt 220.5 lb

## 2018-09-21 DIAGNOSIS — I10 Essential (primary) hypertension: Secondary | ICD-10-CM

## 2018-09-21 DIAGNOSIS — E785 Hyperlipidemia, unspecified: Secondary | ICD-10-CM | POA: Diagnosis not present

## 2018-09-21 DIAGNOSIS — Z794 Long term (current) use of insulin: Secondary | ICD-10-CM

## 2018-09-21 DIAGNOSIS — I2510 Atherosclerotic heart disease of native coronary artery without angina pectoris: Secondary | ICD-10-CM | POA: Diagnosis not present

## 2018-09-21 DIAGNOSIS — I679 Cerebrovascular disease, unspecified: Secondary | ICD-10-CM | POA: Diagnosis not present

## 2018-09-21 DIAGNOSIS — N182 Chronic kidney disease, stage 2 (mild): Secondary | ICD-10-CM

## 2018-09-21 DIAGNOSIS — Z0000 Encounter for general adult medical examination without abnormal findings: Secondary | ICD-10-CM | POA: Diagnosis not present

## 2018-09-21 DIAGNOSIS — E1140 Type 2 diabetes mellitus with diabetic neuropathy, unspecified: Secondary | ICD-10-CM

## 2018-09-21 LAB — CBC WITH DIFFERENTIAL/PLATELET
Basophils Absolute: 0 10*3/uL (ref 0.0–0.1)
Basophils Relative: 0.5 % (ref 0.0–3.0)
Eosinophils Absolute: 0.1 10*3/uL (ref 0.0–0.7)
Eosinophils Relative: 1.4 % (ref 0.0–5.0)
HCT: 35.9 % — ABNORMAL LOW (ref 36.0–46.0)
Hemoglobin: 11.6 g/dL — ABNORMAL LOW (ref 12.0–15.0)
Lymphocytes Relative: 23.9 % (ref 12.0–46.0)
Lymphs Abs: 2.1 10*3/uL (ref 0.7–4.0)
MCHC: 32.2 g/dL (ref 30.0–36.0)
MCV: 78.4 fl (ref 78.0–100.0)
Monocytes Absolute: 0.6 10*3/uL (ref 0.1–1.0)
Monocytes Relative: 7.1 % (ref 3.0–12.0)
Neutro Abs: 5.8 10*3/uL (ref 1.4–7.7)
Neutrophils Relative %: 67.1 % (ref 43.0–77.0)
Platelets: 166 10*3/uL (ref 150.0–400.0)
RBC: 4.58 Mil/uL (ref 3.87–5.11)
RDW: 15.9 % — ABNORMAL HIGH (ref 11.5–15.5)
WBC: 8.6 10*3/uL (ref 4.0–10.5)

## 2018-09-21 LAB — COMPREHENSIVE METABOLIC PANEL
ALT: 14 U/L (ref 0–35)
AST: 13 U/L (ref 0–37)
Albumin: 4.4 g/dL (ref 3.5–5.2)
Alkaline Phosphatase: 52 U/L (ref 39–117)
BUN: 27 mg/dL — ABNORMAL HIGH (ref 6–23)
CO2: 30 mEq/L (ref 19–32)
Calcium: 10 mg/dL (ref 8.4–10.5)
Chloride: 99 mEq/L (ref 96–112)
Creatinine, Ser: 1.26 mg/dL — ABNORMAL HIGH (ref 0.40–1.20)
GFR: 52.76 mL/min — ABNORMAL LOW (ref >60.00)
Glucose, Bld: 290 mg/dL — ABNORMAL HIGH (ref 70–99)
Potassium: 4.4 mEq/L (ref 3.5–5.1)
Sodium: 139 mEq/L (ref 135–145)
Total Bilirubin: 0.5 mg/dL (ref 0.2–1.2)
Total Protein: 7.9 g/dL (ref 6.0–8.3)

## 2018-09-21 LAB — LIPID PANEL
Cholesterol: 116 mg/dL (ref 0–200)
HDL: 54.2 mg/dL (ref >39.00)
LDL Cholesterol: 41 mg/dL (ref 0–99)
NonHDL: 62.26
Total CHOL/HDL Ratio: 2
Triglycerides: 106 mg/dL (ref 0.0–149.0)
VLDL: 21.2 mg/dL (ref 0.0–40.0)

## 2018-09-21 LAB — HEMOGLOBIN A1C: Hgb A1c MFr Bld: 9.7 % — ABNORMAL HIGH (ref 4.6–6.5)

## 2018-09-21 MED ORDER — LOSARTAN POTASSIUM 100 MG PO TABS: 100.0000 mg | ORAL_TABLET | Freq: Every day | ORAL | 1 refills | Status: DC

## 2018-09-21 NOTE — Patient Instructions (Signed)
-Nice seeing you today!!  -Lab work today; will notify you once results are available.  -Make sure you schedule dental care.  -We will arrange a mammogram.  -Consider colonoscopy; we will discuss again at next visit.  -Consider getting shingles vaccination at your pharmacy.  -Schedule follow up in 3 months.   Preventive Care 73-58 Years Old, Female Preventive care refers to visits with your health care provider and lifestyle choices that can promote health and wellness. This includes:  A yearly physical exam. This may also be called an annual well check.  Regular dental visits and eye exams.  Immunizations.  Screening for certain conditions.  Healthy lifestyle choices, such as eating a healthy diet, getting regular exercise, not using drugs or products that contain nicotine and tobacco, and limiting alcohol use. What can I expect for my preventive care visit? Physical exam Your health care provider will check your:  Height and weight. This may be used to calculate body mass index (BMI), which tells if you are at a healthy weight.  Heart rate and blood pressure.  Skin for abnormal spots. Counseling Your health care provider may ask you questions about your:  Alcohol, tobacco, and drug use.  Emotional well-being.  Home and relationship well-being.  Sexual activity.  Eating habits.  Work and work Statistician.  Method of birth control.  Menstrual cycle.  Pregnancy history. What immunizations do I need?  Influenza (flu) vaccine  This is recommended every year. Tetanus, diphtheria, and pertussis (Tdap) vaccine  You may need a Td booster every 10 years. Varicella (chickenpox) vaccine  You may need this if you have not been vaccinated. Zoster (shingles) vaccine  You may need this after age 25. Measles, mumps, and rubella (MMR) vaccine  You may need at least one dose of MMR if you were born in 1957 or later. You may also need a second dose. Pneumococcal  conjugate (PCV13) vaccine  You may need this if you have certain conditions and were not previously vaccinated. Pneumococcal polysaccharide (PPSV23) vaccine  You may need one or two doses if you smoke cigarettes or if you have certain conditions. Meningococcal conjugate (MenACWY) vaccine  You may need this if you have certain conditions. Hepatitis A vaccine  You may need this if you have certain conditions or if you travel or work in places where you may be exposed to hepatitis A. Hepatitis B vaccine  You may need this if you have certain conditions or if you travel or work in places where you may be exposed to hepatitis B. Haemophilus influenzae type b (Hib) vaccine  You may need this if you have certain conditions. Human papillomavirus (HPV) vaccine  If recommended by your health care provider, you may need three doses over 6 months. You may receive vaccines as individual doses or as more than one vaccine together in one shot (combination vaccines). Talk with your health care provider about the risks and benefits of combination vaccines. What tests do I need? Blood tests  Lipid and cholesterol levels. These may be checked every 5 years, or more frequently if you are over 59 years old.  Hepatitis C test.  Hepatitis B test. Screening  Lung cancer screening. You may have this screening every year starting at age 75 if you have a 30-pack-year history of smoking and currently smoke or have quit within the past 15 years.  Colorectal cancer screening. All adults should have this screening starting at age 38 and continuing until age 62. Your health care  provider may recommend screening at age 70 if you are at increased risk. You will have tests every 1-10 years, depending on your results and the type of screening test.  Diabetes screening. This is done by checking your blood sugar (glucose) after you have not eaten for a while (fasting). You may have this done every 1-3 years.   Mammogram. This may be done every 1-2 years. Talk with your health care provider about when you should start having regular mammograms. This may depend on whether you have a family history of breast cancer.  BRCA-related cancer screening. This may be done if you have a family history of breast, ovarian, tubal, or peritoneal cancers.  Pelvic exam and Pap test. This may be done every 3 years starting at age 38. Starting at age 69, this may be done every 5 years if you have a Pap test in combination with an HPV test. Other tests  Sexually transmitted disease (STD) testing.  Bone density scan. This is done to screen for osteoporosis. You may have this scan if you are at high risk for osteoporosis. Follow these instructions at home: Eating and drinking  Eat a diet that includes fresh fruits and vegetables, whole grains, lean protein, and low-fat dairy.  Take vitamin and mineral supplements as recommended by your health care provider.  Do not drink alcohol if: ? Your health care provider tells you not to drink. ? You are pregnant, may be pregnant, or are planning to become pregnant.  If you drink alcohol: ? Limit how much you have to 0-1 drink a day. ? Be aware of how much alcohol is in your drink. In the [place] one drink equals one 12 oz bottle of beer (355 mL), one 5 oz glass of wine (148 mL), or one 1 oz glass of hard liquor (44 mL). Lifestyle  Take daily care of your teeth and gums.  Stay active. Exercise for at least 30 minutes on 5 or more days each week.  Do not use any products that contain nicotine or tobacco, such as cigarettes, e-cigarettes, and chewing tobacco. If you need help quitting, ask your health care provider.  If you are sexually active, practice safe sex. Use a condom or other form of birth control (contraception) in order to prevent pregnancy and STIs (sexually transmitted infections).  If told by your health care provider, take low-dose aspirin daily starting at  age 63. What's next?  Visit your health care provider once a year for a well check visit.  Ask your health care provider how often you should have your eyes and teeth checked.  Stay up to date on all vaccines. This information is not intended to replace advice given to you by your health care provider. Make sure you discuss any questions you have with your health care provider. Document Released: 03/23/2015 Document Revised: 11/05/2017 Document Reviewed: 11/05/2017 Elsevier Patient Education  2020 [employer].

## 2018-09-21 NOTE — Progress Notes (Signed)
Established Patient Office Visit     CC/Reason for Visit: Yearly follow-up of chronic medical conditions  HPI: Lynn Hobbs is a 58 y.o. female who is coming in today for the above mentioned reasons. Past Medical History is significant for:  Ischemic cardiomyopathy with ST elevated MI in 2015 with stent to LAD, followed by Dr. Claiborne Billings, insulin-dependent type 2 diabetes, hypertension, hyperlipidemia.  She was recently started on a PSK 9 inhibitor in addition to her atorvastatin and Zetia .  She is accompanied by her mother today, she has no acute complaints.  We have discussed shingles vaccination.  She has routine eye care.  I have recommended routine dental care.  She is due for:, Breast, colon cervical cancer screening.   Past Medical/Surgical History: Past Medical History:  Diagnosis Date  . BENIGN NEOPLASM OF SKIN SITE UNSPECIFIED 09/08/2008  . CEREBROVASCULAR DISEASE 12/11/2008  . DIABETES MELLITUS, TYPE II 10/12/2006  . Endometriosis   . Fibroids   . HYPERLIPIDEMIA 10/12/2006  . HYPERTENSION 10/12/2006  . PERIPHERAL NEUROPATHY 10/21/2006  . Stroke [facility])     Past Surgical History:  Procedure Laterality Date  . ABDOMINAL HYSTERECTOMY    . LEFT HEART CATHETERIZATION WITH CORONARY ANGIOGRAM N/A 03/24/2013   Procedure: LEFT HEART CATHETERIZATION WITH CORONARY ANGIOGRAM;  Surgeon: Troy Sine, MD;  Location: [facility] CATH LAB;  Service: Cardiovascular;  Laterality: N/A;    Social History:  reports that she has never smoked. She has never used smokeless tobacco. She reports that she does not drink alcohol or use drugs.  Allergies: Allergies  Allergen Reactions  . Tape Rash    Family History:  No history of heart disease, stroke, cancer in her family that she is aware of.  Current Outpatient Medications:  .  acetaminophen (TYLENOL) 650 MG CR tablet, Take 1,300 mg by mouth daily as needed for pain. , Disp: , Rfl:  .  amLODipine (NORVASC) 10 MG tablet, Take 1 tablet (10 mg total)  by mouth daily., Disp: 90 tablet, Rfl: 1 .  aspirin EC 81 MG tablet, Take 81 mg by mouth daily., Disp: , Rfl:  .  atorvastatin (LIPITOR) 80 MG tablet, TAKE ONE TABLET BY MOUTH AT BEDTIME, Disp: 90 tablet, Rfl: 0 .  carvedilol (COREG) 25 MG tablet, Take 2 tablets (50 mg total) by mouth 2 (two) times daily., Disp: 120 tablet, Rfl: 11 .  chlorthalidone (HYGROTON) 50 MG tablet, Take 1 tablet (50 mg total) by mouth daily., Disp: 90 tablet, Rfl: 0 .  Cholecalciferol (VITAMIN D-3) 5000 units TABS, Take 1 tablet by mouth daily., Disp: , Rfl:  .  clopidogrel (PLAVIX) 75 MG tablet, Take 1 tablet (75 mg total) by mouth daily., Disp: 90 tablet, Rfl: 3 .  diphenoxylate-atropine (LOMOTIL) 2.5-0.025 MG tablet, Take 1 tablet by mouth 4 (four) times daily as needed for diarrhea or loose stools., Disp: 30 tablet, Rfl: 0 .  Evolocumab (REPATHA SURECLICK) 222 MG/ML SOAJ, Inject 140 mg into the skin every 14 (fourteen) days., Disp: 2 pen, Rfl: 11 .  insulin aspart (NOVOLOG) 100 UNIT/ML FlexPen, 30 units prior to brunch and dinner. If blood sugar is greater than 150, add an additional 4 units, Disp: 15 pen, Rfl: 3 .  insulin detemir (LEVEMIR) 100 UNIT/ML injection, Inject 0.5 mLs (50 Units total) into the skin at bedtime., Disp: 10 mL, Rfl: 11 .  losartan (COZAAR) 100 MG tablet, Take 1 tablet (100 mg total) by mouth daily., Disp: 90 tablet, Rfl: 1 .  Menthol,  Topical Analgesic, (BLUE-EMU MAXIMUM STRENGTH) 2.5 % LIQD, Apply 1 spray topically as needed (pain)., Disp: , Rfl:  .  nitroGLYCERIN (NITROSTAT) 0.4 MG SL tablet, PLACE ONE TABLET UNDER THE TONGUE EVERY 5 MINUTES FOR 3 DOSES AS NEEDED FOR CHEST PAIN, Disp: 25 tablet, Rfl: 2 .  potassium chloride SA (K-DUR,KLOR-CON) 20 MEQ tablet, TAKE 1 TABLET BY MOUTH  DAILY, Disp: 90 tablet, Rfl: 3 .  RELION PEN NEEDLES 32G X 4 MM MISC, USE 4 TIMES DAILY, Disp: 100 each, Rfl: 6 .  ezetimibe (ZETIA) 10 MG tablet, Take 1 tablet (10 mg total) by mouth daily., Disp: 90 tablet, Rfl: 3   Review of Systems:  Constitutional: Denies fever, chills, diaphoresis, appetite change and fatigue.  HEENT: Denies photophobia, eye pain, redness, hearing loss, ear pain, congestion, sore throat, rhinorrhea, sneezing, mouth sores, trouble swallowing, neck pain, neck stiffness and tinnitus.   Respiratory: Denies SOB, DOE, cough, chest tightness,  and wheezing.   Cardiovascular: Denies chest pain, palpitations and leg swelling.  Gastrointestinal: Denies nausea, vomiting, abdominal pain, diarrhea, constipation, blood in stool and abdominal distention.  Genitourinary: Denies dysuria, urgency, frequency, hematuria, flank pain and difficulty urinating.  Endocrine: Denies: hot or cold intolerance, sweats, changes in hair or nails, polyuria, polydipsia. Musculoskeletal: Denies myalgias, back pain, joint swelling, arthralgias and gait problem.  Skin: Denies pallor, rash and wound.  Neurological: Denies dizziness, seizures, syncope, weakness, light-headedness, numbness and headaches.  Hematological: Denies adenopathy. Easy bruising, personal or family bleeding history  Psychiatric/Behavioral: Denies suicidal ideation, mood changes, confusion, nervousness, sleep disturbance and agitation    Physical Exam: Vitals:   09/21/18 0944  BP: 120/78  Pulse: 83  Temp: 98.7 F (37.1 C)  TempSrc: Oral  SpO2: 97%  Weight: 220 lb 8 oz (100 kg)  Height: 5' 5.75" (1.67 m)    Body mass index is 35.86 kg/m.   Constitutional: NAD, calm, comfortable Eyes: PERRL, lids and conjunctivae normal, wears corrective lenses ENMT: Mucous membranes are moist. Posterior pharynx clear of any exudate or lesions. Normal dentition. Tympanic membrane is pearly white, no erythema or bulging. Neck: normal, supple, no masses, no thyromegaly Respiratory: clear to auscultation bilaterally, no wheezing, no crackles. Normal respiratory effort. No accessory muscle use.  Cardiovascular: Regular rate and rhythm, no murmurs / rubs /  gallops. No extremity edema. 2+ pedal pulses. No carotid bruits.  Abdomen: no tenderness, no masses palpated. No hepatosplenomegaly. Bowel sounds positive.  Musculoskeletal: no clubbing / cyanosis. No joint deformity upper and lower extremities. Good ROM, no contractures. Normal muscle tone.  Skin: no rashes, lesions, ulcers. No induration Neurologic: CN 2-12 grossly intact. Sensation intact, DTR normal. Strength 5/5 in all 4.  Psychiatric: Normal judgment and insight. Alert and oriented x 3. Normal mood.    Impression and Plan:  Encounter for preventive health examination -Have advised routine eye and dental care. -She is due for shingles vaccination which she will consider obtaining at pharmacy, otherwise immunizations are up-to-date. -Screening labs to be done today. -Healthy lifestyle has been discussed in detail today. -She has never had a colonoscopy, I have offered GI referral but she refuses, she is not interested in any other modality for colon cancer screening either. -She has never had a mammogram, will order today. -She refuses Pap smear today for cervical cancer screening.  Dyslipidemia  -Last LDL was 65 in November 2019, recheck lipids today. -She remains on statin, Zetia, PSK 9 inhibitor.  Essential hypertension  -Well-controlled on current medications.  History of CVA in the  past -Noted. -Ambulates with cane.  Type 2 diabetes mellitus with diabetic neuropathy, with long-term current use of insulin (HCC) -Last A1c was 9 in November 2019, at that time her Levemir was increased from 46-50. -Since this change she states her CBGs have been on average around 90-110, suspect her A1c will be significantly improved.  Stage 2 chronic kidney disease -Last creatinine was 1.35 in January, recheck today.  Morbid obesity ([place]) -Discussed healthy lifestyle, including increased physical activity and better food choices to promote weight loss.    Patient Instructions  -Nice  seeing you today!!  -Lab work today; will notify you once results are available.  -Make sure you schedule dental care.  -We will arrange a mammogram.  -Consider colonoscopy; we will discuss again at next visit.  -Consider getting shingles vaccination at your pharmacy.  -Schedule follow up in 3 months.   Preventive Care 72-56 Years Old, Female Preventive care refers to visits with your health care provider and lifestyle choices that can promote health and wellness. This includes:  A yearly physical exam. This may also be called an annual well check.  Regular dental visits and eye exams.  Immunizations.  Screening for certain conditions.  Healthy lifestyle choices, such as eating a healthy diet, getting regular exercise, not using drugs or products that contain nicotine and tobacco, and limiting alcohol use. What can I expect for my preventive care visit? Physical exam Your health care provider will check your:  Height and weight. This may be used to calculate body mass index (BMI), which tells if you are at a healthy weight.  Heart rate and blood pressure.  Skin for abnormal spots. Counseling Your health care provider may ask you questions about your:  Alcohol, tobacco, and drug use.  Emotional well-being.  Home and relationship well-being.  Sexual activity.  Eating habits.  Work and work Statistician.  Method of birth control.  Menstrual cycle.  Pregnancy history. What immunizations do I need?  Influenza (flu) vaccine  This is recommended every year. Tetanus, diphtheria, and pertussis (Tdap) vaccine  You may need a Td booster every 10 years. Varicella (chickenpox) vaccine  You may need this if you have not been vaccinated. Zoster (shingles) vaccine  You may need this after age 52. Measles, mumps, and rubella (MMR) vaccine  You may need at least one dose of MMR if you were born in 1957 or later. You may also need a second dose. Pneumococcal  conjugate (PCV13) vaccine  You may need this if you have certain conditions and were not previously vaccinated. Pneumococcal polysaccharide (PPSV23) vaccine  You may need one or two doses if you smoke cigarettes or if you have certain conditions. Meningococcal conjugate (MenACWY) vaccine  You may need this if you have certain conditions. Hepatitis A vaccine  You may need this if you have certain conditions or if you travel or work in places where you may be exposed to hepatitis A. Hepatitis B vaccine  You may need this if you have certain conditions or if you travel or work in places where you may be exposed to hepatitis B. Haemophilus influenzae type b (Hib) vaccine  You may need this if you have certain conditions. Human papillomavirus (HPV) vaccine  If recommended by your health care provider, you may need three doses over 6 months. You may receive vaccines as individual doses or as more than one vaccine together in one shot (combination vaccines). Talk with your health care provider about the risks and benefits of  combination vaccines. What tests do I need? Blood tests  Lipid and cholesterol levels. These may be checked every 5 years, or more frequently if you are over 95 years old.  Hepatitis C test.  Hepatitis B test. Screening  Lung cancer screening. You may have this screening every year starting at age 59 if you have a 30-pack-year history of smoking and currently smoke or have quit within the past 15 years.  Colorectal cancer screening. All adults should have this screening starting at age 97 and continuing until age 43. Your health care provider may recommend screening at age 90 if you are at increased risk. You will have tests every 1-10 years, depending on your results and the type of screening test.  Diabetes screening. This is done by checking your blood sugar (glucose) after you have not eaten for a while (fasting). You may have this done every 1-3 years.   Mammogram. This may be done every 1-2 years. Talk with your health care provider about when you should start having regular mammograms. This may depend on whether you have a family history of breast cancer.  BRCA-related cancer screening. This may be done if you have a family history of breast, ovarian, tubal, or peritoneal cancers.  Pelvic exam and Pap test. This may be done every 3 years starting at age 21. Starting at age 45, this may be done every 5 years if you have a Pap test in combination with an HPV test. Other tests  Sexually transmitted disease (STD) testing.  Bone density scan. This is done to screen for osteoporosis. You may have this scan if you are at high risk for osteoporosis. Follow these instructions at home: Eating and drinking  Eat a diet that includes fresh fruits and vegetables, whole grains, lean protein, and low-fat dairy.  Take vitamin and mineral supplements as recommended by your health care provider.  Do not drink alcohol if: ? Your health care provider tells you not to drink. ? You are pregnant, may be pregnant, or are planning to become pregnant.  If you drink alcohol: ? Limit how much you have to 0-1 drink a day. ? Be aware of how much alcohol is in your drink. In the [place] one drink equals one 12 oz bottle of beer (355 mL), one 5 oz glass of wine (148 mL), or one 1 oz glass of hard liquor (44 mL). Lifestyle  Take daily care of your teeth and gums.  Stay active. Exercise for at least 30 minutes on 5 or more days each week.  Do not use any products that contain nicotine or tobacco, such as cigarettes, e-cigarettes, and chewing tobacco. If you need help quitting, ask your health care provider.  If you are sexually active, practice safe sex. Use a condom or other form of birth control (contraception) in order to prevent pregnancy and STIs (sexually transmitted infections).  If told by your health care provider, take low-dose aspirin daily starting at  age 97. What's next?  Visit your health care provider once a year for a well check visit.  Ask your health care provider how often you should have your eyes and teeth checked.  Stay up to date on all vaccines. This information is not intended to replace advice given to you by your health care provider. Make sure you discuss any questions you have with your health care provider. Document Released: 03/23/2015 Document Revised: 11/05/2017 Document Reviewed: 11/05/2017 Elsevier Patient Education  2020 [employer].  Estela Hernandez Acosta, MD [facility]   

## 2018-09-22 ENCOUNTER — Other Ambulatory Visit: Payer: Self-pay | Admitting: Internal Medicine

## 2018-09-22 ENCOUNTER — Other Ambulatory Visit: Payer: Self-pay | Admitting: *Deleted

## 2018-09-22 DIAGNOSIS — E1140 Type 2 diabetes mellitus with diabetic neuropathy, unspecified: Secondary | ICD-10-CM

## 2018-09-22 DIAGNOSIS — Z794 Long term (current) use of insulin: Secondary | ICD-10-CM

## 2018-09-22 MED ORDER — INSULIN DETEMIR 100 UNIT/ML ~~LOC~~ SOLN: 60.0000 [IU] | Freq: Every day | SUBCUTANEOUS | 11 refills | Status: DC

## 2018-09-24 ENCOUNTER — Encounter: Payer: Self-pay | Admitting: Internal Medicine

## 2018-09-24 MED ORDER — INSULIN DETEMIR 100 UNIT/ML FLEXPEN: 30.0000 [IU] | PEN_INJECTOR | Freq: Every day | SUBCUTANEOUS | 3 refills | Status: DC

## 2018-10-01 ENCOUNTER — Other Ambulatory Visit: Payer: Self-pay | Admitting: Cardiovascular Disease

## 2018-10-06 ENCOUNTER — Telehealth: Payer: Self-pay | Admitting: *Deleted

## 2018-10-06 ENCOUNTER — Other Ambulatory Visit: Payer: Self-pay

## 2018-10-06 ENCOUNTER — Ambulatory Visit: Payer: Medicare Other | Admitting: Internal Medicine

## 2018-10-06 NOTE — Telephone Encounter (Signed)
Copied from [place] [id number]. Topic: General - Other >> Oct 06, 2018 10:53 AM Ivar Drape wrote: Reason for CRM:  Patient stated she had a 10:00am virtual appt today and she was sent a link but it didn't work.  Please advise.

## 2018-10-08 ENCOUNTER — Ambulatory Visit (INDEPENDENT_AMBULATORY_CARE_PROVIDER_SITE_OTHER): Payer: Medicare Other | Admitting: Internal Medicine

## 2018-10-08 DIAGNOSIS — Z794 Long term (current) use of insulin: Secondary | ICD-10-CM | POA: Diagnosis not present

## 2018-10-08 DIAGNOSIS — E1140 Type 2 diabetes mellitus with diabetic neuropathy, unspecified: Secondary | ICD-10-CM | POA: Diagnosis not present

## 2018-10-08 MED ORDER — INSULIN DETEMIR 100 UNIT/ML FLEXPEN: 60.0000 [IU] | PEN_INJECTOR | Freq: Every day | SUBCUTANEOUS | 3 refills | Status: DC

## 2018-10-08 NOTE — Progress Notes (Signed)
Virtual Visit via Telephone Note  I connected with Lynn Hobbs on 10/08/18 at 11:00 AM EDT by telephone and verified that I am speaking with the correct person using two identifiers.   I discussed the limitations, risks, security and privacy concerns of performing an evaluation and management service by telephone and the availability of in person appointments. I also discussed with the patient that there may be a patient responsible charge related to this service. The patient expressed understanding and agreed to proceed.  Location patient: home Location provider: work office Participants present for the call: patient, provider Patient did not have a visit in the prior 7 days to address this/these issue(s).   History of Present Illness:  This is a scheduled diabetes follow-up.  When I saw her 2 weeks ago for her annual physical her A1c had increased from 9-9.7.  I had asked her to increase her Levemir from 50 to 60 units at nighttime.  In addition she takes 30 units of NovoLog 3 times daily with meals and adds 4 units for a CBG above 150.  Her CBG log for the past 4 days is inserted below:    She has had no hypoglycemic episodes.   Observations/Objective:   Patient sounds cheerful and well on the phone. I do not appreciate any increased work of breathing. Speech and thought processing are grossly intact. Patient reported vitals: None reported   Current Outpatient Medications:  .  acetaminophen (TYLENOL) 650 MG CR tablet, Take 1,300 mg by mouth daily as needed for pain. , Disp: , Rfl:  .  amLODipine (NORVASC) 10 MG tablet, Take 1 tablet (10 mg total) by mouth daily., Disp: 90 tablet, Rfl: 1 .  aspirin EC 81 MG tablet, Take 81 mg by mouth daily., Disp: , Rfl:  .  atorvastatin (LIPITOR) 80 MG tablet, TAKE ONE TABLET BY MOUTH AT BEDTIME, Disp: 90 tablet, Rfl: 0 .  carvedilol (COREG) 25 MG tablet, Take 2 tablets (50 mg total) by mouth 2 (two) times daily., Disp: 120 tablet, Rfl:  11 .  chlorthalidone (HYGROTON) 50 MG tablet, Take 1 tablet (50 mg total) by mouth daily., Disp: 90 tablet, Rfl: 0 .  Cholecalciferol (VITAMIN D-3) 5000 units TABS, Take 1 tablet by mouth daily., Disp: , Rfl:  .  clopidogrel (PLAVIX) 75 MG tablet, TAKE 1 TABLET BY MOUTH ONCE DAILY, Disp: 90 tablet, Rfl: 1 .  diphenoxylate-atropine (LOMOTIL) 2.5-0.025 MG tablet, Take 1 tablet by mouth 4 (four) times daily as needed for diarrhea or loose stools., Disp: 30 tablet, Rfl: 0 .  Evolocumab (REPATHA SURECLICK) 573 MG/ML SOAJ, Inject 140 mg into the skin every 14 (fourteen) days., Disp: 2 pen, Rfl: 11 .  ezetimibe (ZETIA) 10 MG tablet, Take 1 tablet (10 mg total) by mouth daily., Disp: 90 tablet, Rfl: 3 .  insulin aspart (NOVOLOG) 100 UNIT/ML FlexPen, 30 units prior to brunch and dinner. If blood sugar is greater than 150, add an additional 4 units, Disp: 15 pen, Rfl: 3 .  Insulin Detemir (LEVEMIR) 100 UNIT/ML Pen, Inject 60 Units into the skin at bedtime., Disp: 15 mL, Rfl: 3 .  losartan (COZAAR) 100 MG tablet, Take 1 tablet (100 mg total) by mouth daily., Disp: 90 tablet, Rfl: 1 .  Menthol, Topical Analgesic, (BLUE-EMU MAXIMUM STRENGTH) 2.5 % LIQD, Apply 1 spray topically as needed (pain)., Disp: , Rfl:  .  nitroGLYCERIN (NITROSTAT) 0.4 MG SL tablet, PLACE ONE TABLET UNDER THE TONGUE EVERY 5 MINUTES FOR 3 DOSES  AS NEEDED FOR CHEST PAIN, Disp: 25 tablet, Rfl: 2 .  potassium chloride SA (K-DUR,KLOR-CON) 20 MEQ tablet, TAKE 1 TABLET BY MOUTH  DAILY, Disp: 90 tablet, Rfl: 3 .  RELION PEN NEEDLES 32G X 4 MM MISC, USE 4 TIMES DAILY, Disp: 100 each, Rfl: 6  Review of Systems:  Constitutional: Denies fever, chills, diaphoresis, appetite change and fatigue.  HEENT: Denies photophobia, eye pain, redness, hearing loss, ear pain, congestion, sore throat, rhinorrhea, sneezing, mouth sores, trouble swallowing, neck pain, neck stiffness and tinnitus.   Respiratory: Denies SOB, DOE, cough, chest tightness,  and wheezing.    Cardiovascular: Denies chest pain, palpitations and leg swelling.  Gastrointestinal: Denies nausea, vomiting, abdominal pain, diarrhea, constipation, blood in stool and abdominal distention.  Genitourinary: Denies dysuria, urgency, frequency, hematuria, flank pain and difficulty urinating.  Endocrine: Denies: hot or cold intolerance, sweats, changes in hair or nails, polyuria, polydipsia. Musculoskeletal: Denies myalgias, back pain, joint swelling, arthralgias and gait problem.  Skin: Denies pallor, rash and wound.  Neurological: Denies dizziness, seizures, syncope, weakness, light-headedness, numbness and headaches.  Hematological: Denies adenopathy. Easy bruising, personal or family bleeding history  Psychiatric/Behavioral: Denies suicidal ideation, mood changes, confusion, nervousness, sleep disturbance and agitation   Assessment and Plan:  Type 2 diabetes mellitus with diabetic neuropathy, with long-term current use of insulin (HCC) -Significant improvement in CBGs after increasing Levemir dose. -Continue current regimen and follow-up in 3 months.   I discussed the assessment and treatment plan with the patient. The patient was provided an opportunity to ask questions and all were answered. The patient agreed with the plan and demonstrated an understanding of the instructions.   The patient was advised to call back or seek an in-person evaluation if the symptoms worsen or if the condition fails to improve as anticipated.  I provided 13 minutes of non-face-to-face time during this encounter.   Lelon Frohlich, MD [facility] at [facility]

## 2018-10-21 ENCOUNTER — Other Ambulatory Visit: Payer: Self-pay

## 2018-10-21 ENCOUNTER — Encounter (INDEPENDENT_AMBULATORY_CARE_PROVIDER_SITE_OTHER): Payer: Medicare Other | Admitting: Ophthalmology

## 2018-10-21 DIAGNOSIS — I10 Essential (primary) hypertension: Secondary | ICD-10-CM | POA: Diagnosis not present

## 2018-10-21 DIAGNOSIS — E11319 Type 2 diabetes mellitus with unspecified diabetic retinopathy without macular edema: Secondary | ICD-10-CM | POA: Diagnosis not present

## 2018-10-21 DIAGNOSIS — H35033 Hypertensive retinopathy, bilateral: Secondary | ICD-10-CM

## 2018-10-21 DIAGNOSIS — H43813 Vitreous degeneration, bilateral: Secondary | ICD-10-CM

## 2018-10-21 DIAGNOSIS — E113593 Type 2 diabetes mellitus with proliferative diabetic retinopathy without macular edema, bilateral: Secondary | ICD-10-CM

## 2018-10-31 ENCOUNTER — Other Ambulatory Visit: Payer: Self-pay | Admitting: Internal Medicine

## 2018-10-31 DIAGNOSIS — I10 Essential (primary) hypertension: Secondary | ICD-10-CM

## 2018-11-11 ENCOUNTER — Other Ambulatory Visit: Payer: Self-pay | Admitting: Internal Medicine

## 2018-11-11 DIAGNOSIS — I10 Essential (primary) hypertension: Secondary | ICD-10-CM

## 2018-11-26 ENCOUNTER — Other Ambulatory Visit: Payer: Self-pay | Admitting: *Deleted

## 2018-11-26 MED ORDER — POTASSIUM CHLORIDE CRYS ER 20 MEQ PO TBCR: 20.0000 meq | EXTENDED_RELEASE_TABLET | Freq: Every day | ORAL | 1 refills | Status: DC

## 2018-12-16 ENCOUNTER — Other Ambulatory Visit: Payer: Self-pay | Admitting: Internal Medicine

## 2018-12-21 ENCOUNTER — Other Ambulatory Visit: Payer: Self-pay

## 2018-12-22 ENCOUNTER — Ambulatory Visit: Payer: Medicare Other | Admitting: Internal Medicine

## 2018-12-22 ENCOUNTER — Other Ambulatory Visit: Payer: Self-pay | Admitting: Cardiovascular Disease

## 2018-12-22 MED ORDER — EZETIMIBE 10 MG PO TABS: 10.0000 mg | ORAL_TABLET | Freq: Every day | ORAL | 0 refills | Status: DC

## 2018-12-24 ENCOUNTER — Other Ambulatory Visit: Payer: Self-pay | Admitting: *Deleted

## 2018-12-24 MED ORDER — INSULIN ASPART 100 UNIT/ML FLEXPEN: PEN_INJECTOR | SUBCUTANEOUS | 1 refills | Status: DC

## 2019-01-12 ENCOUNTER — Ambulatory Visit (INDEPENDENT_AMBULATORY_CARE_PROVIDER_SITE_OTHER): Payer: Medicare Other | Admitting: Internal Medicine

## 2019-01-12 ENCOUNTER — Other Ambulatory Visit: Payer: Self-pay

## 2019-01-12 ENCOUNTER — Encounter: Payer: Self-pay | Admitting: Internal Medicine

## 2019-01-12 VITALS — BP 120/78 | HR 80 | Temp 97.3°F | Wt 220.0 lb

## 2019-01-12 DIAGNOSIS — Z794 Long term (current) use of insulin: Secondary | ICD-10-CM

## 2019-01-12 DIAGNOSIS — E1140 Type 2 diabetes mellitus with diabetic neuropathy, unspecified: Secondary | ICD-10-CM | POA: Diagnosis not present

## 2019-01-12 DIAGNOSIS — I10 Essential (primary) hypertension: Secondary | ICD-10-CM | POA: Diagnosis not present

## 2019-01-12 DIAGNOSIS — E669 Obesity, unspecified: Secondary | ICD-10-CM

## 2019-01-12 DIAGNOSIS — I2510 Atherosclerotic heart disease of native coronary artery without angina pectoris: Secondary | ICD-10-CM | POA: Diagnosis not present

## 2019-01-12 DIAGNOSIS — E785 Hyperlipidemia, unspecified: Secondary | ICD-10-CM

## 2019-01-12 DIAGNOSIS — Z23 Encounter for immunization: Secondary | ICD-10-CM

## 2019-01-12 LAB — POCT GLYCOSYLATED HEMOGLOBIN (HGB A1C): Hemoglobin A1C: 8.4 % — AB (ref 4.0–5.6)

## 2019-01-12 NOTE — Progress Notes (Signed)
Established Patient Office Visit     CC/Reason for Visit: Follow-up diabetes  HPI: Lynn Hobbs is a 58 y.o. female who is coming in today for the above mentioned reasons.  This is a scheduled diabetic follow-up.  She is currently taking Levemir 60 units at bedtime and doing 30 units of NovoLog with each meal with an additional 4 units if CBG is above 150.  She does not bring her CBG log today, but tells me that her morning and evening sugars are usually below 120 and that her noon sugars are between 140 and 160.  Her last A1c was 9.0 in November 2019.  She has no acute complaints today and is requesting flu vaccine.  Past Medical/Surgical History: Past Medical History:  Diagnosis Date  . BENIGN NEOPLASM OF SKIN SITE UNSPECIFIED 09/08/2008  . CEREBROVASCULAR DISEASE 12/11/2008  . DIABETES MELLITUS, TYPE II 10/12/2006  . Endometriosis   . Fibroids   . HYPERLIPIDEMIA 10/12/2006  . HYPERTENSION 10/12/2006  . PERIPHERAL NEUROPATHY 10/21/2006  . Stroke [facility])     Past Surgical History:  Procedure Laterality Date  . ABDOMINAL HYSTERECTOMY    . LEFT HEART CATHETERIZATION WITH CORONARY ANGIOGRAM N/A 03/24/2013   Procedure: LEFT HEART CATHETERIZATION WITH CORONARY ANGIOGRAM;  Surgeon: Troy Sine, MD;  Location: [facility] CATH LAB;  Service: Cardiovascular;  Laterality: N/A;    Social History:  reports that she has never smoked. She has never used smokeless tobacco. She reports that she does not drink alcohol or use drugs.  Allergies: Allergies  Allergen Reactions  . Tape Rash    Family History:  No history of heart disease, cancer, stroke that she is aware of.  Current Outpatient Medications:  .  acetaminophen (TYLENOL) 650 MG CR tablet, Take 1,300 mg by mouth daily as needed for pain. , Disp: , Rfl:  .  amLODipine (NORVASC) 10 MG tablet, TAKE 1 TABLET BY MOUTH DAILY, Disp: 90 tablet, Rfl: 1 .  aspirin EC 81 MG tablet, Take 81 mg by mouth daily., Disp: , Rfl:  .  atorvastatin  (LIPITOR) 80 MG tablet, TAKE ONE TABLET BY MOUTH AT BEDTIME, Disp: 90 tablet, Rfl: 0 .  BD PEN NEEDLE NANO U/F 32G X 4 MM MISC, USE FOUR TIMES DAILY, Disp: 100 each, Rfl: 6 .  carvedilol (COREG) 25 MG tablet, Take 2 tablets (50 mg total) by mouth 2 (two) times daily., Disp: 120 tablet, Rfl: 11 .  chlorthalidone (HYGROTON) 50 MG tablet, TAKE 1 TABLET BY MOUTH ONCE DAILY, Disp: 90 tablet, Rfl: 1 .  Cholecalciferol (VITAMIN D-3) 5000 units TABS, Take 1 tablet by mouth daily., Disp: , Rfl:  .  clopidogrel (PLAVIX) 75 MG tablet, TAKE 1 TABLET BY MOUTH ONCE DAILY, Disp: 90 tablet, Rfl: 1 .  diphenoxylate-atropine (LOMOTIL) 2.5-0.025 MG tablet, Take 1 tablet by mouth 4 (four) times daily as needed for diarrhea or loose stools., Disp: 30 tablet, Rfl: 0 .  Evolocumab (REPATHA SURECLICK) XX123456 MG/ML SOAJ, Inject 140 mg into the skin every 14 (fourteen) days., Disp: 2 pen, Rfl: 11 .  ezetimibe (ZETIA) 10 MG tablet, TAKE 1 TABLET(10 MG) BY MOUTH DAILY, Disp: 30 tablet, Rfl: 0 .  insulin aspart (NOVOLOG) 100 UNIT/ML FlexPen, 30 units prior to brunch and dinner. If blood sugar is greater than 150, add an additional 4 units, Disp: 45 pen, Rfl: 1 .  Insulin Detemir (LEVEMIR) 100 UNIT/ML Pen, Inject 60 Units into the skin at bedtime., Disp: 15 mL, Rfl: 3 .  losartan (COZAAR) 100 MG tablet, Take 1 tablet (100 mg total) by mouth daily., Disp: 90 tablet, Rfl: 1 .  Menthol, Topical Analgesic, (BLUE-EMU MAXIMUM STRENGTH) 2.5 % LIQD, Apply 1 spray topically as needed (pain)., Disp: , Rfl:  .  nitroGLYCERIN (NITROSTAT) 0.4 MG SL tablet, PLACE ONE TABLET UNDER THE TONGUE EVERY 5 MINUTES FOR 3 DOSES AS NEEDED FOR CHEST PAIN, Disp: 25 tablet, Rfl: 2 .  potassium chloride SA (K-DUR) 20 MEQ tablet, Take 1 tablet (20 mEq total) by mouth daily., Disp: 90 tablet, Rfl: 1  Review of Systems:  Constitutional: Denies fever, chills, diaphoresis, appetite change and fatigue.  HEENT: Denies photophobia, eye pain, redness, hearing loss,  ear pain, congestion, sore throat, rhinorrhea, sneezing, mouth sores, trouble swallowing, neck pain, neck stiffness and tinnitus.   Respiratory: Denies SOB, DOE, cough, chest tightness,  and wheezing.   Cardiovascular: Denies chest pain, palpitations and leg swelling.  Gastrointestinal: Denies nausea, vomiting, abdominal pain, diarrhea, constipation, blood in stool and abdominal distention.  Genitourinary: Denies dysuria, urgency, frequency, hematuria, flank pain and difficulty urinating.  Endocrine: Denies: hot or cold intolerance, sweats, changes in hair or nails, polyuria, polydipsia. Musculoskeletal: Denies myalgias, back pain, joint swelling, arthralgias and gait problem.  Skin: Denies pallor, rash and wound.  Neurological: Denies dizziness, seizures, syncope, weakness, light-headedness, numbness and headaches.  Hematological: Denies adenopathy. Easy bruising, personal or family bleeding history  Psychiatric/Behavioral: Denies suicidal ideation, mood changes, confusion, nervousness, sleep disturbance and agitation    Physical Exam: Vitals:   01/12/19 1100  BP: 120/78  Pulse: 80  Temp: (!) 97.3 F (36.3 C)  TempSrc: Temporal  SpO2: 99%  Weight: 220 lb (99.8 kg)    Body mass index is 35.78 kg/m.   Constitutional: NAD, calm, comfortable Eyes: PERRL, lids and conjunctivae normal, wears corrective lenses ENMT: Mucous membranes are moist. Respiratory: clear to auscultation bilaterally, no wheezing, no crackles. Normal respiratory effort. No accessory muscle use.  Cardiovascular: Regular rate and rhythm, no murmurs / rubs / gallops. No extremity edema. 2+ pedal pulses.  Abdomen: no tenderness, no masses palpated. No hepatosplenomegaly. Bowel sounds positive.  Musculoskeletal: no clubbing / cyanosis. No joint deformity upper and lower extremities. Good ROM, no contractures. Normal muscle tone.  Skin: no rashes, lesions, ulcers. No induration Psychiatric: Normal judgment and  insight. Alert and oriented x 3. Normal mood.    Impression and Plan:  Type 2 diabetes mellitus with diabetic neuropathy, with long-term current use of insulin (HCC)  -A1c in office today is 8.4. -Since her higher CBGs seem to be at lunchtime we will ask her to increase lunchtime NovoLog to 35 units + additional 4 for CBG of 150, she has also been asked to look at the carb content of her breakfast. -She will follow-up in 3 months.  Dyslipidemia -Last LDL was at goal at 65, continue Lipitor.  Morbid obesity -Discussed healthy lifestyle, including increased physical activity and better food choices to promote weight loss.  Essential hypertension -Well-controlled.    Patient Instructions  -Nice seeing you today!!  -Flu vaccine today.  -Increase lunch time novolog to 35 units plus 4 units for a CBG >150.  -Schedule follow up in 3 months.     Lelon Frohlich, MD [facility] at [facility]

## 2019-01-12 NOTE — Patient Instructions (Signed)
-  Nice seeing you today!!  -Flu vaccine today.  -Increase lunch time novolog to 35 units plus 4 units for a CBG >150.  -Schedule follow up in 3 months.

## 2019-03-06 ENCOUNTER — Other Ambulatory Visit: Payer: Self-pay | Admitting: Internal Medicine

## 2019-03-29 ENCOUNTER — Other Ambulatory Visit: Payer: Self-pay

## 2019-03-29 MED ORDER — NITROGLYCERIN 0.4 MG SL SUBL: SUBLINGUAL_TABLET | SUBLINGUAL | 2 refills | Status: DC

## 2019-04-06 ENCOUNTER — Other Ambulatory Visit: Payer: Self-pay | Admitting: Cardiovascular Disease

## 2019-04-14 ENCOUNTER — Ambulatory Visit: Payer: Medicare Other | Admitting: Internal Medicine

## 2019-04-18 ENCOUNTER — Other Ambulatory Visit: Payer: Self-pay | Admitting: Internal Medicine

## 2019-04-18 ENCOUNTER — Other Ambulatory Visit: Payer: Self-pay | Admitting: Cardiovascular Disease

## 2019-04-18 DIAGNOSIS — I10 Essential (primary) hypertension: Secondary | ICD-10-CM

## 2019-04-27 ENCOUNTER — Encounter (INDEPENDENT_AMBULATORY_CARE_PROVIDER_SITE_OTHER): Payer: Medicare Other | Admitting: Ophthalmology

## 2019-05-04 ENCOUNTER — Ambulatory Visit: Payer: Medicare Other | Admitting: Internal Medicine

## 2019-05-05 ENCOUNTER — Telehealth: Payer: Self-pay | Admitting: Cardiovascular Disease

## 2019-05-05 NOTE — Telephone Encounter (Signed)
New message:     Patient calling stating that she come with her mother for her apt to help each other.  They both have apts on 05/05/19. Please call patient if any questions.

## 2019-05-05 NOTE — Telephone Encounter (Signed)
Patient's mother Lelon Frohlich is aware that it is OK for each to attend each other's appointments due to time/transportation

## 2019-05-06 ENCOUNTER — Other Ambulatory Visit: Payer: Self-pay

## 2019-05-06 ENCOUNTER — Ambulatory Visit (INDEPENDENT_AMBULATORY_CARE_PROVIDER_SITE_OTHER): Payer: Medicare Other | Admitting: Cardiovascular Disease

## 2019-05-06 ENCOUNTER — Encounter: Payer: Self-pay | Admitting: Cardiovascular Disease

## 2019-05-06 VITALS — BP 150/82 | HR 78 | Temp 97.0°F | Ht 65.0 in | Wt 219.6 lb

## 2019-05-06 DIAGNOSIS — I2102 ST elevation (STEMI) myocardial infarction involving left anterior descending coronary artery: Secondary | ICD-10-CM | POA: Diagnosis not present

## 2019-05-06 DIAGNOSIS — Z79899 Other long term (current) drug therapy: Secondary | ICD-10-CM | POA: Diagnosis not present

## 2019-05-06 DIAGNOSIS — I2510 Atherosclerotic heart disease of native coronary artery without angina pectoris: Secondary | ICD-10-CM | POA: Diagnosis not present

## 2019-05-06 DIAGNOSIS — Z794 Long term (current) use of insulin: Secondary | ICD-10-CM

## 2019-05-06 DIAGNOSIS — E118 Type 2 diabetes mellitus with unspecified complications: Secondary | ICD-10-CM

## 2019-05-06 DIAGNOSIS — E785 Hyperlipidemia, unspecified: Secondary | ICD-10-CM | POA: Diagnosis not present

## 2019-05-06 DIAGNOSIS — I10 Essential (primary) hypertension: Secondary | ICD-10-CM | POA: Diagnosis not present

## 2019-05-06 LAB — LIPID PANEL
Chol/HDL Ratio: 2.9 ratio (ref 0.0–4.4)
Cholesterol, Total: 179 mg/dL (ref 100–199)
HDL: 61 mg/dL (ref >39)
LDL Chol Calc (NIH): 95 mg/dL (ref 0–99)
Triglycerides: 131 mg/dL (ref 0–149)
VLDL Cholesterol Cal: 23 mg/dL (ref 5–40)

## 2019-05-06 LAB — COMPREHENSIVE METABOLIC PANEL
ALT: 10 IU/L (ref 0–32)
AST: 11 IU/L (ref 0–40)
Albumin/Globulin Ratio: 1.2 (ref 1.2–2.2)
Albumin: 4.4 g/dL (ref 3.8–4.9)
Alkaline Phosphatase: 75 IU/L (ref 39–117)
BUN/Creatinine Ratio: 22 (ref 9–23)
BUN: 27 mg/dL — ABNORMAL HIGH (ref 6–24)
Bilirubin Total: 0.3 mg/dL (ref 0.0–1.2)
CO2: 24 mmol/L (ref 20–29)
Calcium: 10.2 mg/dL (ref 8.7–10.2)
Chloride: 98 mmol/L (ref 96–106)
Creatinine, Ser: 1.23 mg/dL — ABNORMAL HIGH (ref 0.57–1.00)
GFR calc Af Amer: 56 mL/min/{1.73_m2} — ABNORMAL LOW (ref >59)
GFR calc non Af Amer: 48 mL/min/{1.73_m2} — ABNORMAL LOW (ref >59)
Globulin, Total: 3.6 g/dL (ref 1.5–4.5)
Glucose: 258 mg/dL — ABNORMAL HIGH (ref 65–99)
Potassium: 4.3 mmol/L (ref 3.5–5.2)
Sodium: 137 mmol/L (ref 134–144)
Total Protein: 8 g/dL (ref 6.0–8.5)

## 2019-05-06 LAB — CBC
Hematocrit: 35.3 % (ref 34.0–46.6)
Hemoglobin: 11.2 g/dL (ref 11.1–15.9)
MCH: 24.8 pg — ABNORMAL LOW (ref 26.6–33.0)
MCHC: 31.7 g/dL (ref 31.5–35.7)
MCV: 78 fL — ABNORMAL LOW (ref 79–97)
Platelets: 226 10*3/uL (ref 150–450)
RBC: 4.51 x10E6/uL (ref 3.77–5.28)
RDW: 14.9 % (ref 11.7–15.4)
WBC: 7.3 10*3/uL (ref 3.4–10.8)

## 2019-05-06 LAB — TSH: TSH: 1.6 u[IU]/mL (ref 0.450–4.500)

## 2019-05-06 LAB — HEMOGLOBIN A1C
Est. average glucose Bld gHb Est-mCnc: 206 mg/dL
Hgb A1c MFr Bld: 8.8 % — ABNORMAL HIGH (ref 4.8–5.6)

## 2019-05-06 MED ORDER — SPIRONOLACTONE 25 MG PO TABS: 12.5000 mg | ORAL_TABLET | Freq: Every day | ORAL | 2 refills | Status: DC
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
## 2019-05-06 NOTE — Patient Instructions (Signed)
Medication Instructions:  BEGIN TAKING SPIRONOLACTONE 12.5MG  DAILY (1/2 TABLET DAILY) *If you need a refill on your cardiac medications before your next appointment, please call your pharmacy*   Lab Work: FASTING LABS: LIPID CMET TSH CBC HGBA1C  If you have labs (blood work) drawn today and your tests are completely normal, you will receive your results only by: Marland Kitchen MyChart Message (if you have MyChart) OR . A paper copy in the mail If you have any lab test that is abnormal or we need to change your treatment, we will call you to review the results.   Testing/Procedures: Your physician has requested that you have an echocardiogram. Echocardiography is a painless test that uses sound waves to create images of your heart. It provides your doctor with information about the size and shape of your heart and how well your heart's chambers and valves are working. This procedure takes approximately one hour. There are no restrictions for this procedure.  [place]   Follow-Up: At [facility], you and your health needs are our priority.  As part of our continuing mission to provide you with exceptional heart care, we have created designated Provider Care Teams.  These Care Teams include your primary Cardiologist (physician) and Advanced Practice Providers (APPs -  Physician Assistants and Nurse Practitioners) who all work together to provide you with the care you need, when you need it.  We recommend signing up for the patient portal called "MyChart".  Sign up information is provided on this After Visit Summary.  MyChart is used to connect with patients for Virtual Visits (Telemedicine).  Patients are able to view lab/test results, encounter notes, upcoming appointments, etc.  Non-urgent messages can be sent to your provider as well.   To learn more about what you can do with MyChart, go to [email or web address].    Your next appointment:   6 month(s)  The format for your next  appointment:   In Person  Provider:   [name], MD

## 2019-05-08 ENCOUNTER — Encounter: Payer: Self-pay | Admitting: Cardiovascular Disease

## 2019-05-16 ENCOUNTER — Other Ambulatory Visit: Payer: Self-pay

## 2019-05-17 ENCOUNTER — Ambulatory Visit (INDEPENDENT_AMBULATORY_CARE_PROVIDER_SITE_OTHER): Payer: Medicare Other | Admitting: Family

## 2019-05-17 ENCOUNTER — Encounter: Payer: Self-pay | Admitting: Family

## 2019-05-17 DIAGNOSIS — M25562 Pain in left knee: Secondary | ICD-10-CM

## 2019-05-17 DIAGNOSIS — M25461 Effusion, right knee: Secondary | ICD-10-CM | POA: Diagnosis not present

## 2019-05-17 DIAGNOSIS — I2510 Atherosclerotic heart disease of native coronary artery without angina pectoris: Secondary | ICD-10-CM

## 2019-05-17 DIAGNOSIS — M25462 Effusion, left knee: Secondary | ICD-10-CM

## 2019-05-17 DIAGNOSIS — M25561 Pain in right knee: Secondary | ICD-10-CM

## 2019-05-17 MED ORDER — ACCU-CHEK GUIDE ME W/DEVICE KIT: 1.0000 | PACK | Freq: Every day | 0 refills | Status: DC

## 2019-05-17 MED ORDER — DICLOFENAC SODIUM 1 % EX GEL: 4.0000 g | Freq: Four times a day (QID) | CUTANEOUS | 1 refills | Status: DC

## 2019-05-17 MED ORDER — ACCU-CHEK SOFTCLIX LANCETS MISC: 1.0000 | Freq: Every morning | 12 refills | Status: DC

## 2019-05-17 NOTE — Patient Instructions (Signed)
Prepatellar Bursitis  Prepatellar bursitis is inflammation of the prepatellar bursa, which is a fluid-filled sac that cushions the kneecap (patella). Prepatellar bursitis happens when fluid builds up in this sac and causes it to swell. The condition causes knee pain. What are the causes? This condition may be caused by:  Constant pressure on the knees from kneeling.  A hit to the knee.  Falling on the knee.  Infection from bacteria.  Moving the knee often in a forceful way. What increases the risk? You are more likely to develop this condition if:  You play sports that have a high risk of falling on the knee or being hit on the knee. These include football, wrestling, basketball, or soccer.  You do work in which you kneel for long periods of time, such as roofing, plumbing, or gardening.  You have another inflammatory condition, such as gout or rheumatoid arthritis. What are the signs or symptoms? The most common symptom of this condition is knee pain that gets better with rest. Other symptoms include:  Swelling on the front of the kneecap.  Warmth in the knee.  Tenderness with activity.  Redness in the knee.  Inability to bend the knee or to kneel. How is this diagnosed? This condition is diagnosed based on:  A physical exam. Your health care provider will compare your knees and check for tenderness and pain while moving your knee.  Your medical history.  Tests to check for infection. These may include blood tests and tests on the fluid in the bursa.  Imaging tests, such as X-ray, MRI, or ultrasound, to check for damage in the patella, or fluid buildup and swelling in the bursa. How is this treated? This condition may be treated by:  Resting the knee.  Putting ice on the knee.  Taking medicines, such as: ? NSAIDs. These can help to reduce pain and swelling. ? Antibiotics. These may be needed if you have an infection. ? Steroids. These are used to reduce  swelling and inflammation, and may be prescribed if other treatments are not helping.  Raising (elevating) the knee while resting.  Doing exercises to help you maintain movement (physical therapy). These may be recommended after pain and swelling improve.  Having a procedure to remove fluid from the bursa. This may be done if other treatments are not helping.  Having surgery to remove the bursa. This may be done if you have a severe infection or if the condition keeps coming back after treatment. Follow these instructions at home: Medicines  Take over-the-counter and prescription medicines only as told by your health care provider.  If you were prescribed an antibiotic medicine, take it as told by your health care provider. Do not stop taking the antibiotic even if you start to feel better. Managing pain, stiffness, and swelling   If directed, put ice on the injured area. ? Put ice in a plastic bag. ? Place a towel between your skin and the bag. ? Leave the ice on for 20 minutes, 2-3 times a day.  Elevate the injured area above the level of your heart while you are sitting or lying down. Activity  Do not use the injured limb to support your body weight until your health care provider says that you can.  Rest your knee.  Avoid activities that cause knee pain.  Return to your normal activities as told by your health care provider. Ask your health care provider what activities are safe for you.  Do exercises as   told by your health care provider. General instructions  Ask your health care provider when it is safe for you to drive.  Do not use any products that contain nicotine or tobacco, such as cigarettes, e-cigarettes, and chewing tobacco. These can delay healing. If you need help quitting, ask your health care provider.  Keep all follow-up visits as told by your health care provider. This is important. How is this prevented?  Warm up and stretch before being  active.  Cool down and stretch after being active.  Give your body time to rest between periods of activity.  Maintain physical fitness, including strength and flexibility.  Be safe and responsible while being active. This will help you to avoid falls.  Wear knee pads if you have to kneel for a long period of time. Contact a health care provider if:  Your symptoms do not improve or get worse.  Your symptoms keep coming back after treatment.  You develop a fever and have warmth, redness, or swelling over your knee. Summary  Prepatellar bursitis is inflammation of the prepatellar bursa, which is a fluid-filled sac that cushions the kneecap (patella).  This condition may be caused by injury or constant pressure on the knee. It may also be caused by an infection from bacteria.  Symptoms of this condition include pain, swelling, warmth, and tenderness in the knee.  Follow instructions from your health care provider about taking medicines, resting, and doing activities.  Contact your health care provider if your symptoms do not improve, get worse, or keep coming back after treatment. This information is not intended to replace advice given to you by your health care provider. Make sure you discuss any questions you have with your health care provider. Document Revised: 06/18/2018 Document Reviewed: 05/06/2018 Elsevier Patient Education  2020 [employer]  
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
## 2019-05-24 ENCOUNTER — Encounter: Payer: Self-pay | Admitting: Orthopaedic Surgery

## 2019-05-24 ENCOUNTER — Other Ambulatory Visit: Payer: Self-pay

## 2019-05-24 ENCOUNTER — Ambulatory Visit (INDEPENDENT_AMBULATORY_CARE_PROVIDER_SITE_OTHER): Payer: Medicare Other

## 2019-05-24 ENCOUNTER — Ambulatory Visit: Payer: Self-pay

## 2019-05-24 ENCOUNTER — Ambulatory Visit (INDEPENDENT_AMBULATORY_CARE_PROVIDER_SITE_OTHER): Payer: Medicare Other | Admitting: Orthopaedic Surgery

## 2019-05-24 DIAGNOSIS — M25561 Pain in right knee: Secondary | ICD-10-CM

## 2019-05-24 DIAGNOSIS — M25562 Pain in left knee: Secondary | ICD-10-CM

## 2019-05-24 DIAGNOSIS — I2510 Atherosclerotic heart disease of native coronary artery without angina pectoris: Secondary | ICD-10-CM
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
## 2019-05-30 ENCOUNTER — Encounter: Payer: Self-pay | Admitting: Internal Medicine

## 2019-05-30 MED ORDER — ACCU-CHEK AVIVA PLUS VI STRP: ORAL_STRIP | 3 refills | Status: DC

## 2019-06-01 ENCOUNTER — Other Ambulatory Visit: Payer: Self-pay

## 2019-06-01 ENCOUNTER — Ambulatory Visit (HOSPITAL_COMMUNITY): Payer: Medicare Other | Attending: Cardiology

## 2019-06-01 DIAGNOSIS — I2102 ST elevation (STEMI) myocardial infarction involving left anterior descending coronary artery: Secondary | ICD-10-CM | POA: Insufficient documentation

## 2019-06-01 DIAGNOSIS — Z79899 Other long term (current) drug therapy: Secondary | ICD-10-CM | POA: Diagnosis not present

## 2019-06-01 DIAGNOSIS — E785 Hyperlipidemia, unspecified: Secondary | ICD-10-CM | POA: Insufficient documentation

## 2019-06-01 DIAGNOSIS — I10 Essential (primary) hypertension: Secondary | ICD-10-CM

## 2019-06-01 DIAGNOSIS — I2510 Atherosclerotic heart disease of native coronary artery without angina pectoris: Secondary | ICD-10-CM | POA: Insufficient documentation

## 2019-06-01 MED ORDER — PERFLUTREN LIPID MICROSPHERE
1.0000 mL | INTRAVENOUS | Status: AC | PRN
  Administered 2019-06-01: 1 mL via INTRAVENOUS

## 2019-06-07 ENCOUNTER — Telehealth: Payer: Medicare Other | Admitting: Internal Medicine

## 2019-06-23 DIAGNOSIS — Z23 Encounter for immunization: Secondary | ICD-10-CM | POA: Diagnosis not present

## 2019-06-29 ENCOUNTER — Encounter: Payer: Self-pay | Admitting: Internal Medicine

## 2019-06-29 DIAGNOSIS — U071 COVID-19: Secondary | ICD-10-CM | POA: Diagnosis not present

## 2019-06-29 DIAGNOSIS — R05 Cough: Secondary | ICD-10-CM | POA: Diagnosis not present

## 2019-06-29 DIAGNOSIS — Z03818 Encounter for observation for suspected exposure to other biological agents ruled out: Secondary | ICD-10-CM | POA: Diagnosis not present

## 2019-06-29 DIAGNOSIS — Z20828 Contact with and (suspected) exposure to other viral communicable diseases: Secondary | ICD-10-CM | POA: Diagnosis not present

## 2019-07-05 ENCOUNTER — Encounter: Payer: Self-pay | Admitting: Family Medicine

## 2019-07-05 ENCOUNTER — Other Ambulatory Visit: Payer: Self-pay

## 2019-07-05 ENCOUNTER — Telehealth (INDEPENDENT_AMBULATORY_CARE_PROVIDER_SITE_OTHER): Payer: Medicare Other | Admitting: Family Medicine

## 2019-07-05 DIAGNOSIS — U071 COVID-19: Secondary | ICD-10-CM | POA: Diagnosis not present

## 2019-07-05 DIAGNOSIS — Z7189 Other specified counseling: Secondary | ICD-10-CM

## 2019-07-05 DIAGNOSIS — R5383 Other fatigue: Secondary | ICD-10-CM | POA: Diagnosis not present

## 2019-07-05 DIAGNOSIS — Z7185 Encounter for immunization safety counseling: Secondary | ICD-10-CM
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
## 2019-07-11 ENCOUNTER — Telehealth: Payer: Self-pay | Admitting: Internal Medicine

## 2019-07-11 NOTE — Telephone Encounter (Signed)
Attending Physician's Statement for Continuing Waiver of Premium form to be filled out-- placed in the dr's folder.  Fax to: [phone], Attn: [employer] Claims Department

## 2019-07-12 NOTE — Telephone Encounter (Signed)
Placed in Dr Hernandez's folder 

## 2019-07-14 NOTE — Telephone Encounter (Signed)
Form faxed, confirmed, copy mailed to home address per patient request

## 2019-07-16 ENCOUNTER — Other Ambulatory Visit: Payer: Self-pay | Admitting: Cardiovascular Disease

## 2019-07-16 ENCOUNTER — Other Ambulatory Visit: Payer: Self-pay | Admitting: Internal Medicine

## 2019-07-21 ENCOUNTER — Telehealth: Payer: Self-pay | Admitting: Internal Medicine

## 2019-07-21 MED ORDER — ACCU-CHEK GUIDE VI STRP: 1.0000 | ORAL_STRIP | Freq: Three times a day (TID) | 1 refills | Status: DC

## 2019-07-21 MED ORDER — ACCU-CHEK SOFTCLIX LANCETS MISC: 1 refills | Status: AC

## 2019-07-21 NOTE — Telephone Encounter (Signed)
Patient received a card for a glucose monitor, however she needs strips and lancets.  Unsure what she needs.

## 2019-07-21 NOTE — Telephone Encounter (Signed)
Rx sent 

## 2019-07-25 DIAGNOSIS — Z23 Encounter for immunization: Secondary | ICD-10-CM | POA: Diagnosis not present

## 2019-08-11 ENCOUNTER — Other Ambulatory Visit: Payer: Self-pay | Admitting: Cardiovascular Disease

## 2019-08-11 DIAGNOSIS — I10 Essential (primary) hypertension: Secondary | ICD-10-CM

## 2019-08-11 DIAGNOSIS — I2102 ST elevation (STEMI) myocardial infarction involving left anterior descending coronary artery: Secondary | ICD-10-CM

## 2019-08-11 DIAGNOSIS — Z79899 Other long term (current) drug therapy: Secondary | ICD-10-CM

## 2019-08-11 DIAGNOSIS — I2510 Atherosclerotic heart disease of native coronary artery without angina pectoris: Secondary | ICD-10-CM

## 2019-08-11 DIAGNOSIS — E785 Hyperlipidemia, unspecified: Secondary | ICD-10-CM

## 2019-10-07 ENCOUNTER — Other Ambulatory Visit: Payer: Self-pay | Admitting: *Deleted

## 2019-10-07 NOTE — Telephone Encounter (Signed)
Refill request faxed from [facility]. Spoke with patient and she does not want any prescription coming from [facility] at this time.

## 2019-10-12 ENCOUNTER — Other Ambulatory Visit: Payer: Self-pay | Admitting: Internal Medicine

## 2019-10-12 ENCOUNTER — Other Ambulatory Visit: Payer: Self-pay | Admitting: Cardiovascular Disease

## 2019-10-12 DIAGNOSIS — I10 Essential (primary) hypertension: Secondary | ICD-10-CM

## 2019-10-14 ENCOUNTER — Other Ambulatory Visit: Payer: Self-pay | Admitting: Internal Medicine

## 2019-10-14 DIAGNOSIS — I10 Essential (primary) hypertension: Secondary | ICD-10-CM

## 2019-11-28 ENCOUNTER — Other Ambulatory Visit: Payer: Self-pay | Admitting: Internal Medicine

## 2019-12-02 ENCOUNTER — Other Ambulatory Visit: Payer: Self-pay

## 2019-12-02 ENCOUNTER — Ambulatory Visit (INDEPENDENT_AMBULATORY_CARE_PROVIDER_SITE_OTHER): Payer: Medicare Other | Admitting: Cardiovascular Disease

## 2019-12-02 ENCOUNTER — Encounter: Payer: Self-pay | Admitting: Cardiovascular Disease

## 2019-12-02 VITALS — BP 132/80 | HR 77 | Temp 97.0°F | Ht 64.0 in | Wt 221.2 lb

## 2019-12-02 DIAGNOSIS — E785 Hyperlipidemia, unspecified: Secondary | ICD-10-CM | POA: Diagnosis not present

## 2019-12-02 DIAGNOSIS — I10 Essential (primary) hypertension: Secondary | ICD-10-CM | POA: Diagnosis not present

## 2019-12-02 DIAGNOSIS — E118 Type 2 diabetes mellitus with unspecified complications: Secondary | ICD-10-CM | POA: Diagnosis not present

## 2019-12-02 DIAGNOSIS — I2510 Atherosclerotic heart disease of native coronary artery without angina pectoris: Secondary | ICD-10-CM | POA: Diagnosis not present

## 2019-12-02 DIAGNOSIS — I2102 ST elevation (STEMI) myocardial infarction involving left anterior descending coronary artery: Secondary | ICD-10-CM | POA: Diagnosis not present

## 2019-12-02 DIAGNOSIS — Z794 Long term (current) use of insulin: Secondary | ICD-10-CM

## 2019-12-02 MED ORDER — NITROGLYCERIN 0.4 MG SL SUBL: SUBLINGUAL_TABLET | SUBLINGUAL | 2 refills | Status: DC
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
## 2019-12-02 NOTE — Patient Instructions (Signed)
   Follow-Up: At [facility], you and your health needs are our priority.  As part of our continuing mission to provide you with exceptional heart care, we have created designated Provider Care Teams.  These Care Teams include your primary Cardiologist (physician) and Advanced Practice Providers (APPs -  Physician Assistants and Nurse Practitioners) who all work together to provide you with the care you need, when you need it.  We recommend signing up for the patient portal called "MyChart".  Sign up information is provided on this After Visit Summary.  MyChart is used to connect with patients for Virtual Visits (Telemedicine).  Patients are able to view lab/test results, encounter notes, upcoming appointments, etc.  Non-urgent messages can be sent to your provider as well.   To learn more about what you can do with MyChart, go to [email or web address].    Your next appointment:   12 month(s)  The format for your next appointment:   In Person  Provider:   You may see Thomas Kelly, MD or one of the following Advanced Practice Providers on your designated Care Team:    Hao Meng, PA-C  Angela Duke, PA-C or   Krista Kroeger, PA-C     

## 2020-01-12 ENCOUNTER — Other Ambulatory Visit: Payer: Self-pay | Admitting: Cardiovascular Disease

## 2020-01-12 ENCOUNTER — Other Ambulatory Visit: Payer: Self-pay | Admitting: Internal Medicine

## 2020-01-12 DIAGNOSIS — I10 Essential (primary) hypertension: Secondary | ICD-10-CM

## 2020-01-26 ENCOUNTER — Other Ambulatory Visit: Payer: Self-pay | Admitting: Internal Medicine

## 2020-03-29 ENCOUNTER — Other Ambulatory Visit: Payer: Self-pay | Admitting: Internal Medicine

## 2020-03-29 DIAGNOSIS — Z23 Encounter for immunization: Secondary | ICD-10-CM | POA: Diagnosis not present
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
## 2020-03-31 ENCOUNTER — Other Ambulatory Visit: Payer: Self-pay | Admitting: Internal Medicine

## 2020-03-31 ENCOUNTER — Other Ambulatory Visit: Payer: Self-pay | Admitting: Family

## 2020-03-31 DIAGNOSIS — I10 Essential (primary) hypertension: Secondary | ICD-10-CM

## 2020-04-13 ENCOUNTER — Other Ambulatory Visit: Payer: Self-pay | Admitting: Cardiovascular Disease

## 2020-06-29 ENCOUNTER — Other Ambulatory Visit: Payer: Self-pay | Admitting: Internal Medicine

## 2020-06-29 DIAGNOSIS — I10 Essential (primary) hypertension: Secondary | ICD-10-CM

## 2020-07-17 ENCOUNTER — Other Ambulatory Visit: Payer: Self-pay

## 2020-07-18 ENCOUNTER — Encounter: Payer: Self-pay | Admitting: Internal Medicine

## 2020-07-18 ENCOUNTER — Ambulatory Visit (INDEPENDENT_AMBULATORY_CARE_PROVIDER_SITE_OTHER): Payer: Medicare Other | Admitting: Internal Medicine

## 2020-07-18 VITALS — BP 140/80 | HR 78 | Temp 98.0°F | Wt 223.0 lb

## 2020-07-18 DIAGNOSIS — N182 Chronic kidney disease, stage 2 (mild): Secondary | ICD-10-CM | POA: Diagnosis not present

## 2020-07-18 DIAGNOSIS — I2102 ST elevation (STEMI) myocardial infarction involving left anterior descending coronary artery: Secondary | ICD-10-CM

## 2020-07-18 DIAGNOSIS — Z79899 Other long term (current) drug therapy: Secondary | ICD-10-CM | POA: Diagnosis not present

## 2020-07-18 DIAGNOSIS — I2510 Atherosclerotic heart disease of native coronary artery without angina pectoris: Secondary | ICD-10-CM | POA: Diagnosis not present

## 2020-07-18 DIAGNOSIS — Z794 Long term (current) use of insulin: Secondary | ICD-10-CM | POA: Diagnosis not present

## 2020-07-18 DIAGNOSIS — E1140 Type 2 diabetes mellitus with diabetic neuropathy, unspecified: Secondary | ICD-10-CM | POA: Diagnosis not present

## 2020-07-18 DIAGNOSIS — E669 Obesity, unspecified: Secondary | ICD-10-CM

## 2020-07-18 DIAGNOSIS — I10 Essential (primary) hypertension: Secondary | ICD-10-CM | POA: Diagnosis not present

## 2020-07-18 DIAGNOSIS — E785 Hyperlipidemia, unspecified: Secondary | ICD-10-CM | POA: Diagnosis not present

## 2020-07-18 LAB — POCT GLYCOSYLATED HEMOGLOBIN (HGB A1C): Hemoglobin A1C: 10.6 % — AB (ref 4.0–5.6)

## 2020-07-18 MED ORDER — ACCU-CHEK GUIDE VI STRP: 1.0000 | ORAL_STRIP | Freq: Three times a day (TID) | 1 refills | Status: AC

## 2020-07-18 MED ORDER — EZETIMIBE 10 MG PO TABS: ORAL_TABLET | ORAL | 1 refills | Status: DC

## 2020-07-18 MED ORDER — CHLORTHALIDONE 50 MG PO TABS
50.0000 mg | ORAL_TABLET | Freq: Every day | ORAL | 1 refills | Status: DC
Start: 2020-07-18 — End: 2021-01-04

## 2020-07-18 MED ORDER — SPIRONOLACTONE 25 MG PO TABS: ORAL_TABLET | ORAL | 6 refills | Status: DC

## 2020-07-18 MED ORDER — DIPHENOXYLATE-ATROPINE 2.5-0.025 MG PO TABS: 1.0000 | ORAL_TABLET | Freq: Four times a day (QID) | ORAL | 2 refills | Status: AC | PRN

## 2020-07-18 MED ORDER — DICLOFENAC SODIUM 1 % EX GEL: 4.0000 g | Freq: Four times a day (QID) | CUTANEOUS | 1 refills | Status: DC

## 2020-07-18 MED ORDER — FREESTYLE LIBRE 14 DAY READER DEVI: 1.0000 | Freq: Every day | 1 refills | Status: DC

## 2020-07-18 MED ORDER — LOSARTAN POTASSIUM 100 MG PO TABS: 1.0000 | ORAL_TABLET | Freq: Every day | ORAL | 1 refills | Status: DC

## 2020-07-18 MED ORDER — NOVOLOG FLEXPEN 100 UNIT/ML ~~LOC~~ SOPN: PEN_INJECTOR | SUBCUTANEOUS | 1 refills | Status: DC

## 2020-07-18 MED ORDER — CLOPIDOGREL BISULFATE 75 MG PO TABS
75.0000 mg | ORAL_TABLET | Freq: Every day | ORAL | 1 refills | Status: DC
Start: 2020-07-18 — End: 2021-03-21

## 2020-07-18 MED ORDER — CARVEDILOL 25 MG PO TABS: 50.0000 mg | ORAL_TABLET | Freq: Two times a day (BID) | ORAL | 1 refills | Status: DC

## 2020-07-18 MED ORDER — FREESTYLE LIBRE 14 DAY SENSOR MISC: 1.0000 | Freq: Every day | 3 refills | Status: DC

## 2020-07-18 MED ORDER — AMLODIPINE BESYLATE 10 MG PO TABS
1.0000 | ORAL_TABLET | Freq: Every day | ORAL | 1 refills | Status: DC
Start: 2020-07-18 — End: 2021-01-04

## 2020-07-18 MED ORDER — ATORVASTATIN CALCIUM 80 MG PO TABS
1.0000 | ORAL_TABLET | Freq: Every day | ORAL | 1 refills | Status: DC
Start: 2020-07-18 — End: 2021-09-30

## 2020-07-18 NOTE — Progress Notes (Signed)
Established Patient Office Visit     This visit occurred during the SARS-CoV-2 public health emergency.  Safety protocols were in place, including screening questions prior to the visit, additional usage of staff PPE, and extensive cleaning of exam room while observing appropriate contact time as indicated for disinfecting solutions.    CC/Reason for Visit: Follow-up chronic medical conditions  HPI: Lynn Hobbs is a 60 y.o. female who is coming in today for the above mentioned reasons. Past Medical History is significant for: Prior CVA with resultant left-sided hemiparesis, ischemic cardiomyopathy with ST elevated MI in 2015 with stent to LAD, followed by Dr. Claiborne Billings, insulin-dependent type 2 diabetes, hypertension, hyperlipidemia.  Unfortunately I have not seen her since November 2020.  She says that she was trying to stay out of medical offices because of [place].  She has seen her cardiologist more recently.  She needs forms filled out for her disability.  She has been complaining of increased fatigue.  In office A1c today is 10.6 and blood pressure is 140/80.  When questioned she admits to not being compliant with medications.  I asked her who has been prescribing her insulin as I usually do not prescribe medications for patients to have been out of the office for over a year.  She tells me she still has some leftover from 2 years ago.  She has not been checking her blood sugars.   Past Medical/Surgical History: Past Medical History:  Diagnosis Date  . BENIGN NEOPLASM OF SKIN SITE UNSPECIFIED 09/08/2008  . CEREBROVASCULAR DISEASE 12/11/2008  . DIABETES MELLITUS, TYPE II 10/12/2006  . Endometriosis   . Fibroids   . HYPERLIPIDEMIA 10/12/2006  . HYPERTENSION 10/12/2006  . PERIPHERAL NEUROPATHY 10/21/2006  . Stroke [facility])     Past Surgical History:  Procedure Laterality Date  . ABDOMINAL HYSTERECTOMY    . LEFT HEART CATHETERIZATION WITH CORONARY ANGIOGRAM N/A 03/24/2013   Procedure: LEFT  HEART CATHETERIZATION WITH CORONARY ANGIOGRAM;  Surgeon: Troy Sine, MD;  Location: [facility] CATH LAB;  Service: Cardiovascular;  Laterality: N/A;    Social History:  reports that she has never smoked. She has never used smokeless tobacco. She reports that she does not drink alcohol and does not use drugs.  Allergies: Allergies  Allergen Reactions  . Tape Rash    Family History:  No history of heart disease, cancer, stroke that she is aware of.   Current Outpatient Medications:  .  Accu-Chek Softclix Lancets lancets, Use 3 times daily to check glucose.  Dx E11.40 and Z79.4, Disp: 300 each, Rfl: 1 .  acetaminophen (TYLENOL) 650 MG CR tablet, Take 1,300 mg by mouth daily as needed for pain. , Disp: , Rfl:  .  aspirin EC 81 MG tablet, Take 81 mg by mouth daily., Disp: , Rfl:  .  BD PEN NEEDLE NANO 2ND GEN 32G X 4 MM MISC, USE FOUR TIMES DAILY, Disp: 100 each, Rfl: 6 .  Blood Glucose Monitoring Suppl (ACCU-CHEK GUIDE ME) w/Device KIT, 1 each by Does not apply route daily. E11.9, Disp: 1 kit, Rfl: 0 .  Cholecalciferol (VITAMIN D-3) 5000 units TABS, Take 1 tablet by mouth daily., Disp: , Rfl:  .  LEVEMIR FLEXTOUCH 100 UNIT/ML FlexPen, ADMINISTER 30 UNITS UNDER THE SKIN AT BEDTIME, Disp: 15 mL, Rfl: 3 .  nitroGLYCERIN (NITROSTAT) 0.4 MG SL tablet, PLACE ONE TABLET UNDER THE TONGUE EVERY 5 MINUTES FOR 3 DOSES AS NEEDED FOR CHEST PAIN, Disp: 25 tablet, Rfl: 2 .  potassium chloride SA (KLOR-CON) 20 MEQ tablet, TAKE 1 TABLET(20 MEQ) BY MOUTH DAILY, Disp: 90 tablet, Rfl: 0 .  REPATHA SURECLICK 321 MG/ML SOAJ, INJECT 140 MG SUBCUTANEOUSLY EVERY 14 DAYS, Disp: 2 mL, Rfl: 11 .  amLODipine (NORVASC) 10 MG tablet, Take 1 tablet (10 mg total) by mouth daily., Disp: 90 tablet, Rfl: 1 .  atorvastatin (LIPITOR) 80 MG tablet, Take 1 tablet (80 mg total) by mouth at bedtime., Disp: 90 tablet, Rfl: 1 .  carvedilol (COREG) 25 MG tablet, Take 2 tablets (50 mg total) by mouth 2 (two) times daily., Disp: 360 tablet,  Rfl: 1 .  chlorthalidone (HYGROTON) 50 MG tablet, Take 1 tablet (50 mg total) by mouth daily., Disp: 90 tablet, Rfl: 1 .  clopidogrel (PLAVIX) 75 MG tablet, Take 1 tablet (75 mg total) by mouth daily., Disp: 90 tablet, Rfl: 1 .  diclofenac Sodium (VOLTAREN) 1 % GEL, Apply 4 g topically 4 (four) times daily., Disp: 100 g, Rfl: 1 .  diphenoxylate-atropine (LOMOTIL) 2.5-0.025 MG tablet, Take 1 tablet by mouth 4 (four) times daily as needed for diarrhea or loose stools., Disp: 30 tablet, Rfl: 2 .  ezetimibe (ZETIA) 10 MG tablet, TAKE 1 TABLET(10 MG) BY MOUTH DAILY, Disp: 90 tablet, Rfl: 1 .  glucose blood (ACCU-CHEK GUIDE) test strip, 1 each by Other route 3 (three) times daily. Dx E11.40 and Z79.4, Disp: 300 each, Rfl: 1 .  insulin aspart (NOVOLOG FLEXPEN) 100 UNIT/ML FlexPen, INJECT 30 UNITS PRIOR TO BRUNCH AND DINNER. IF BLOOD SUGAR IS GREATER THAN 150, ADD AN ADDITIONAL 4 UNITS, Disp: 15 mL, Rfl: 1 .  losartan (COZAAR) 100 MG tablet, Take 1 tablet (100 mg total) by mouth daily., Disp: 90 tablet, Rfl: 1 .  spironolactone (ALDACTONE) 25 MG tablet, TAKE 1/2 TABLET(12.5 MG) BY MOUTH DAILY, Disp: 15 tablet, Rfl: 6  Review of Systems:  Constitutional: Denies fever, chills, diaphoresis, appetite change. HEENT: Denies photophobia, eye pain, redness, hearing loss, ear pain, congestion, sore throat, rhinorrhea, sneezing, mouth sores, trouble swallowing, neck pain, neck stiffness and tinnitus.   Respiratory: Denies SOB, DOE, cough, chest tightness,  and wheezing.   Cardiovascular: Denies chest pain, palpitations and leg swelling.  Gastrointestinal: Denies nausea, vomiting, abdominal pain, diarrhea, constipation, blood in stool and abdominal distention.  Genitourinary: Denies dysuria, urgency, frequency, hematuria, flank pain and difficulty urinating.  Endocrine: Denies: hot or cold intolerance, sweats, changes in hair or nails, polyuria, polydipsia. Musculoskeletal: Positive for myalgias, back pain, joint  swelling, arthralgias and gait problem.  Skin: Denies pallor, rash and wound.  Neurological: Denies dizziness, seizures, syncope, weakness, light-headedness, numbness and headaches.  Hematological: Denies adenopathy. Easy bruising, personal or family bleeding history  Psychiatric/Behavioral: Denies suicidal ideation, mood changes, confusion, nervousness, sleep disturbance and agitation    Physical Exam: Vitals:   07/18/20 1043  BP: 140/80  Pulse: 78  Temp: 98 F (36.7 C)  TempSrc: Oral  SpO2: 99%  Weight: 223 lb (101.2 kg)    Body mass index is 38.28 kg/m.   Constitutional: NAD, calm, comfortable, ambulates with a cane Eyes: PERRL, lids and conjunctivae normal, wears corrective lenses ENMT: Mucous membranes are moist.  Respiratory: clear to auscultation bilaterally, no wheezing, no crackles. Normal respiratory effort. No accessory muscle use.  Cardiovascular: Regular rate and rhythm, no murmurs / rubs / gallops. No extremity edema. Psychiatric: Normal judgment and insight. Alert and oriented x 3. Normal mood.    Impression and Plan:  Type 2 diabetes mellitus with diabetic neuropathy, with long-term current  use of insulin ([place])  - Plan: insulin aspart (NOVOLOG FLEXPEN) 100 UNIT/ML FlexPen, glucose blood (ACCU-CHEK GUIDE) test strip, POCT glycosylated hemoglobin (Hb A1C) -Unsurprisingly, not well controlled due to diet and medication noncompliance. -She will resume her Levemir 40 units at bedtime and NovoLog 32 units before each meal which is what has been prescribed to her previously. -I will send in prescription for a freestyle libre, sample has been provided today. -As she is an uncontrolled insulin-dependent diabetes, I will schedule her to see endocrinology for further management. -Advised that she needs to resume routine medical care as she is at very high risk of complications due to her diabetes and other comorbidities.  ST elevation myocardial infarction involving  left anterior descending (LAD) coronary artery [facility]): March 23, 2013 CAD in native artery -Noted, stable, no chest pain  Essential hypertension  - Plan: spironolactone (ALDACTONE) 25 MG tablet, losartan (COZAAR) 100 MG tablet, chlorthalidone (HYGROTON) 50 MG tablet, carvedilol (COREG) 25 MG tablet, amLODipine (NORVASC) 10 MG tablet -Medications have been refilled today, she admits to noncompliance.  Hyperlipidemia with target LDL less than 70 -This is followed by cardiology. -Her last cholesterol panel was in February 2021 with a total cholesterol of 179, LDL of 95 and triglycerides of 131.  Stage 2 chronic kidney disease  -Baseline creatinine is around 1.2-1.3.  Obesity (BMI 30-39.9) -Discussed healthy lifestyle, including increased physical activity and better food choices to promote weight loss.   Time spent: 33 minutes reviewing chart, interviewing patient and family member, examining patient and formulating plan of care.   Patient Instructions  -Nice seeing you today!!  -Referral to endocrinology has been placed.  -Schedule follow up in 3 months.     Lelon Frohlich, MD [facility] at [facility]

## 2020-07-18 NOTE — Addendum Note (Signed)
Addended by: Westley Hummer B on: 07/18/2020 05:17 PM   Modules accepted: Orders

## 2020-07-18 NOTE — Patient Instructions (Signed)
-  Nice seeing you today!!  -Referral to endocrinology has been placed.  -Schedule follow up in 3 months.

## 2020-07-25 ENCOUNTER — Other Ambulatory Visit: Payer: Self-pay | Admitting: *Deleted

## 2020-07-25 MED ORDER — DEXCOM G6 RECEIVER DEVI: 1.0000 | Freq: Every day | 1 refills | Status: DC

## 2020-07-25 MED ORDER — DEXCOM G6 TRANSMITTER MISC: 1.0000 | Freq: Every day | 3 refills | Status: DC

## 2020-07-25 MED ORDER — DEXCOM G6 SENSOR MISC: 1.0000 | Freq: Every day | 3 refills | Status: DC

## 2020-07-25 NOTE — Telephone Encounter (Signed)
Rx sent 

## 2020-07-30 ENCOUNTER — Telehealth: Payer: Self-pay | Admitting: Internal Medicine

## 2020-07-30 NOTE — Telephone Encounter (Signed)
The patient gave Dr. Jerilee Hoh forms to fill out at last visit and was following up on the completion.   Please advise

## 2020-07-31 NOTE — Telephone Encounter (Signed)
Form completed, faxed, confirmed.  Copy mailed to home address per patient request.

## 2020-08-28 ENCOUNTER — Other Ambulatory Visit: Payer: Self-pay | Admitting: Internal Medicine

## 2020-10-15 ENCOUNTER — Other Ambulatory Visit: Payer: Self-pay | Admitting: Internal Medicine

## 2020-10-15 DIAGNOSIS — I10 Essential (primary) hypertension: Secondary | ICD-10-CM

## 2020-10-17 ENCOUNTER — Other Ambulatory Visit: Payer: Self-pay

## 2020-10-18 ENCOUNTER — Ambulatory Visit (INDEPENDENT_AMBULATORY_CARE_PROVIDER_SITE_OTHER): Payer: Medicare Other | Admitting: Internal Medicine

## 2020-10-18 ENCOUNTER — Encounter: Payer: Self-pay | Admitting: Internal Medicine

## 2020-10-18 VITALS — BP 170/90 | HR 93 | Temp 98.0°F | Wt 216.6 lb

## 2020-10-18 DIAGNOSIS — I10 Essential (primary) hypertension: Secondary | ICD-10-CM

## 2020-10-18 DIAGNOSIS — E669 Obesity, unspecified: Secondary | ICD-10-CM

## 2020-10-18 DIAGNOSIS — E1140 Type 2 diabetes mellitus with diabetic neuropathy, unspecified: Secondary | ICD-10-CM

## 2020-10-18 DIAGNOSIS — I2510 Atherosclerotic heart disease of native coronary artery without angina pectoris: Secondary | ICD-10-CM | POA: Diagnosis not present

## 2020-10-18 DIAGNOSIS — Z794 Long term (current) use of insulin: Secondary | ICD-10-CM

## 2020-10-18 DIAGNOSIS — E785 Hyperlipidemia, unspecified: Secondary | ICD-10-CM | POA: Diagnosis not present

## 2020-10-18 LAB — POCT GLYCOSYLATED HEMOGLOBIN (HGB A1C): Hemoglobin A1C: 10.7 % — AB (ref 4.0–5.6)

## 2020-10-18 MED ORDER — LEVEMIR FLEXTOUCH 100 UNIT/ML ~~LOC~~ SOPN: 48.0000 [IU] | PEN_INJECTOR | Freq: Every day | SUBCUTANEOUS | 3 refills | Status: DC

## 2020-10-18 MED ORDER — NOVOLOG FLEXPEN 100 UNIT/ML ~~LOC~~ SOPN: 33.0000 [IU] | PEN_INJECTOR | Freq: Three times a day (TID) | SUBCUTANEOUS | 11 refills | Status: DC

## 2020-10-18 NOTE — Patient Instructions (Signed)
-  Nice seeing you today!!  -Lab work today; will notify you once results are available.  -Make sure you schedule follow up with cardiologist.  -Will arrange for you to see an endocrinologist.  -increase Levemir to 48 units at bedtime.  -Increase novolog to 33 units three times a day with meals.  -Schedule follow up in 3 months.

## 2020-10-18 NOTE — Progress Notes (Signed)
Established Patient Office Visit     This visit occurred during the SARS-CoV-2 public health emergency.  Safety protocols were in place, including screening questions prior to the visit, additional usage of staff PPE, and extensive cleaning of exam room while observing appropriate contact time as indicated for disinfecting solutions.    CC/Reason for Visit: 26-monthfollow-up chronic conditions  HPI: Lynn YJERELYN TRIMARCOis a 60y.o. female who is coming in today for the above mentioned reasons.  She has a very complex past medical history significant for insulin-dependent diabetes that has not been well controlled, hypertension that is not well controlled, hyperlipidemia with most recent LDL not at goal, history of coronary artery disease with non-STEMI, history of prior CVA with left-sided hemiparesis.  She had been lost to follow-up for over 2 years and just reconnected with me in May.  Her blood pressure and diabetes remain uncontrolled.  She has lost 7 pounds through healthier eating.  She states she has been compliant with all of her medication and in fact brought them in today with her.  She has no concerns or complaints.  Past Medical/Surgical History: Past Medical History:  Diagnosis Date   BENIGN NEOPLASM OF SKIN SITE UNSPECIFIED 09/08/2008   CEREBROVASCULAR DISEASE 12/11/2008   DIABETES MELLITUS, TYPE II 10/12/2006   Endometriosis    Fibroids    HYPERLIPIDEMIA 10/12/2006   HYPERTENSION 10/12/2006   PERIPHERAL NEUROPATHY 10/21/2006   Stroke ([facility]     Past Surgical History:  Procedure Laterality Date   ABDOMINAL HYSTERECTOMY     LEFT HEART CATHETERIZATION WITH CORONARY ANGIOGRAM N/A 03/24/2013   Procedure: LEFT HEART CATHETERIZATION WITH CORONARY ANGIOGRAM;  Surgeon: TTroy Sine MD;  [facility];  Service: Cardiovascular;  Laterality: N/A;    Social History:  reports that she has never smoked. She has never used smokeless tobacco. She reports that she does not drink  alcohol and does not use drugs.  Allergies: Allergies  Allergen Reactions   Tape Rash    Family History:  No history of heart disease, cancer, stroke that she is aware of   Current Outpatient Medications:    Accu-Chek Softclix Lancets lancets, Use 3 times daily to check glucose.  Dx E11.40 and Z79.4, Disp: 300 each, Rfl: 1   acetaminophen (TYLENOL) 650 MG CR tablet, Take 1,300 mg by mouth daily as needed for pain. , Disp: , Rfl:    amLODipine (NORVASC) 10 MG tablet, Take 1 tablet (10 mg total) by mouth daily., Disp: 90 tablet, Rfl: 1   aspirin EC 81 MG tablet, Take 81 mg by mouth daily., Disp: , Rfl:    atorvastatin (LIPITOR) 80 MG tablet, Take 1 tablet (80 mg total) by mouth at bedtime., Disp: 90 tablet, Rfl: 1   BD PEN NEEDLE NANO 2ND GEN 32G X 4 MM MISC, USE FOUR TIMES DAILY, Disp: 100 each, Rfl: 6   Blood Glucose Monitoring Suppl (ACCU-CHEK GUIDE ME) w/Device KIT, 1 each by Does not apply route daily. E11.9, Disp: 1 kit, Rfl: 0   carvedilol (COREG) 25 MG tablet, Take 2 tablets (50 mg total) by mouth 2 (two) times daily., Disp: 360 tablet, Rfl: 1   chlorthalidone (HYGROTON) 50 MG tablet, Take 1 tablet (50 mg total) by mouth daily., Disp: 90 tablet, Rfl: 1   Cholecalciferol (VITAMIN D-3) 5000 units TABS, Take 1 tablet by mouth daily., Disp: , Rfl:    clopidogrel (PLAVIX) 75 MG tablet, Take 1 tablet (75 mg total) by mouth  daily., Disp: 90 tablet, Rfl: 1   Continuous Blood Gluc Receiver (DEXCOM G6 RECEIVER) DEVI, 1 each by Does not apply route daily. E11.9, Disp: 1 each, Rfl: 1   Continuous Blood Gluc Sensor (DEXCOM G6 SENSOR) MISC, 1 each by Does not apply route daily. Dx e11.9, Disp: 3 each, Rfl: 3   Continuous Blood Gluc Transmit (DEXCOM G6 TRANSMITTER) MISC, 1 each by Does not apply route daily. E11.9, Disp: 1 each, Rfl: 3   diclofenac Sodium (VOLTAREN) 1 % GEL, Apply 4 g topically 4 (four) times daily., Disp: 100 g, Rfl: 1   diphenoxylate-atropine (LOMOTIL) 2.5-0.025 MG tablet, Take  1 tablet by mouth 4 (four) times daily as needed for diarrhea or loose stools., Disp: 30 tablet, Rfl: 2   ezetimibe (ZETIA) 10 MG tablet, TAKE 1 TABLET(10 MG) BY MOUTH DAILY, Disp: 90 tablet, Rfl: 1   glucose blood (ACCU-CHEK GUIDE) test strip, 1 each by Other route 3 (three) times daily. Dx E11.40 and Z79.4, Disp: 300 each, Rfl: 1   losartan (COZAAR) 100 MG tablet, TAKE 1 TABLET(100 MG) BY MOUTH DAILY, Disp: 90 tablet, Rfl: 1   nitroGLYCERIN (NITROSTAT) 0.4 MG SL tablet, PLACE ONE TABLET UNDER THE TONGUE EVERY 5 MINUTES FOR 3 DOSES AS NEEDED FOR CHEST PAIN, Disp: 25 tablet, Rfl: 2   potassium chloride SA (KLOR-CON) 20 MEQ tablet, TAKE 1 TABLET(20 MEQ) BY MOUTH DAILY, Disp: 90 tablet, Rfl: 0   REPATHA SURECLICK 466 MG/ML SOAJ, INJECT 140 MG SUBCUTANEOUSLY EVERY 14 DAYS, Disp: 2 mL, Rfl: 11   spironolactone (ALDACTONE) 25 MG tablet, TAKE 1/2 TABLET(12.5 MG) BY MOUTH DAILY, Disp: 15 tablet, Rfl: 6   insulin aspart (NOVOLOG FLEXPEN) 100 UNIT/ML FlexPen, Inject 33 Units into the skin 3 (three) times daily with meals. INJECT 30 UNITS PRIOR TO BRUNCH AND DINNER. IF BLOOD SUGAR IS GREATER THAN 150, ADD AN ADDITIONAL 4 UNITS, Disp: 3 mL, Rfl: 11   insulin detemir (LEVEMIR FLEXTOUCH) 100 UNIT/ML FlexPen, Inject 48 Units into the skin at bedtime., Disp: 15 mL, Rfl: 3  Review of Systems:  Constitutional: Denies fever, chills, diaphoresis, appetite change and fatigue.  HEENT: Denies photophobia, eye pain, redness, hearing loss, ear pain, congestion, sore throat, rhinorrhea, sneezing, mouth sores, trouble swallowing, neck pain, neck stiffness and tinnitus.   Respiratory: Denies SOB, DOE, cough, chest tightness,  and wheezing.   Cardiovascular: Denies chest pain, palpitations and leg swelling.  Gastrointestinal: Denies nausea, vomiting, abdominal pain, diarrhea, constipation, blood in stool and abdominal distention.  Genitourinary: Denies dysuria, urgency, frequency, hematuria, flank pain and difficulty  urinating.  Endocrine: Denies: hot or cold intolerance, sweats, changes in hair or nails, polyuria, polydipsia. Musculoskeletal: Denies myalgias, back pain, joint swelling, arthralgias and gait problem.  Skin: Denies pallor, rash and wound.  Neurological: Denies dizziness, seizures, syncope,  light-headedness, numbness and headaches.  Hematological: Denies adenopathy. Easy bruising, personal or family bleeding history  Psychiatric/Behavioral: Denies suicidal ideation, mood changes, confusion, nervousness, sleep disturbance and agitation    Physical Exam: Vitals:   10/18/20 1018  BP: (!) 170/90  Pulse: 93  Temp: 98 F (36.7 C)  TempSrc: Oral  SpO2: 96%  Weight: 216 lb 9.6 oz (98.2 kg)    Body mass index is 37.18 kg/m.   Constitutional: NAD, calm, comfortable Eyes: PERRL, lids and conjunctivae normal, wears corrective lenses ENMT: Mucous membranes are moist.  Respiratory: clear to auscultation bilaterally, no wheezing, no crackles. Normal respiratory effort. No accessory muscle use.  Cardiovascular: Regular rate and rhythm, no murmurs /  rubs / gallops. No extremity edema.  Neurologic: Left-sided hemiparesis, ambulates with a cane Psychiatric: Normal judgment and insight. Alert and oriented x 3. Normal mood.    Impression and Plan:  Type 2 diabetes mellitus with diabetic neuropathy, with long-term current use of insulin ([place])  -Very uncontrolled with an A1c of 10.7 today. -I will increase her Levemir from 42 to 48 units at bedtime and her NovoLog from 40 to 33 units 3 times daily with meals. -I have placed referral to endocrinology for further management. -Believe she would be a good candidate for continuous blood glucose monitoring.  Dyslipidemia -She is on atorvastatin 80 mg daily and ezetimibe 10 mg daily. -It would appear that at some point she was on evolocumab but I do not see that on her medication list and she tells me she has not been getting injections every other  week. -Check lipids today, further discussed with cardiology.  Essential hypertension -Remains uncontrolled. -She is currently on amlodipine 10 mg, chlorthalidone 50 mg, losartan 100 mg, spironolactone 25 mg and carvedilol 50 mg twice daily. -Her blood pressure is elevated today 2 separate measurements of 170/90, 150/90. -She would prefer for her cardiologist to increase her medication.  She tells me she has an appointment coming up soon in September. -Not sure what medication would be next, query clonidine.  Obesity (BMI 30-39.9) -Discussed healthy lifestyle, including increased physical activity and better food choices to promote weight loss. -She has been congratulated on her weight loss success thus far.  CAD in native artery -Followed by cardiology, no current chest pain or dyspnea on exertion.    Patient Instructions  -Nice seeing you today!!  -Lab work today; will notify you once results are available.  -Make sure you schedule follow up with cardiologist.  -Will arrange for you to see an endocrinologist.  -increase Levemir to 48 units at bedtime.  -Increase novolog to 33 units three times a day with meals.  -Schedule follow up in 3 months.    Lelon Frohlich, MD [facility] at [facility]

## 2020-11-30 ENCOUNTER — Other Ambulatory Visit: Payer: Self-pay | Admitting: Internal Medicine

## 2020-12-06 ENCOUNTER — Other Ambulatory Visit: Payer: Self-pay | Admitting: Internal Medicine

## 2020-12-15 ENCOUNTER — Other Ambulatory Visit: Payer: Self-pay | Admitting: Cardiovascular Disease

## 2020-12-15 DIAGNOSIS — I2510 Atherosclerotic heart disease of native coronary artery without angina pectoris: Secondary | ICD-10-CM

## 2021-01-04 ENCOUNTER — Other Ambulatory Visit: Payer: Self-pay | Admitting: Internal Medicine

## 2021-01-04 DIAGNOSIS — I10 Essential (primary) hypertension: Secondary | ICD-10-CM

## 2021-01-22 ENCOUNTER — Ambulatory Visit: Payer: Medicare Other | Admitting: Internal Medicine

## 2021-02-06 ENCOUNTER — Encounter: Payer: Self-pay | Admitting: Internal Medicine

## 2021-02-06 ENCOUNTER — Ambulatory Visit (INDEPENDENT_AMBULATORY_CARE_PROVIDER_SITE_OTHER): Payer: Medicare Other | Admitting: Internal Medicine

## 2021-02-06 VITALS — BP 130/80 | HR 76 | Temp 98.1°F | Wt 214.8 lb

## 2021-02-06 DIAGNOSIS — I2510 Atherosclerotic heart disease of native coronary artery without angina pectoris: Secondary | ICD-10-CM | POA: Diagnosis not present

## 2021-02-06 DIAGNOSIS — I10 Essential (primary) hypertension: Secondary | ICD-10-CM | POA: Diagnosis not present

## 2021-02-06 DIAGNOSIS — Z794 Long term (current) use of insulin: Secondary | ICD-10-CM

## 2021-02-06 DIAGNOSIS — E785 Hyperlipidemia, unspecified: Secondary | ICD-10-CM | POA: Diagnosis not present

## 2021-02-06 DIAGNOSIS — I679 Cerebrovascular disease, unspecified: Secondary | ICD-10-CM | POA: Diagnosis not present

## 2021-02-06 DIAGNOSIS — E1140 Type 2 diabetes mellitus with diabetic neuropathy, unspecified: Secondary | ICD-10-CM | POA: Diagnosis not present

## 2021-02-06 DIAGNOSIS — Z23 Encounter for immunization: Secondary | ICD-10-CM

## 2021-02-06 LAB — COMPREHENSIVE METABOLIC PANEL
ALT: 15 U/L (ref 0–35)
AST: 18 U/L (ref 0–37)
Albumin: 4.3 g/dL (ref 3.5–5.2)
Alkaline Phosphatase: 51 U/L (ref 39–117)
BUN: 28 mg/dL — ABNORMAL HIGH (ref 6–23)
CO2: 29 mEq/L (ref 19–32)
Calcium: 10.8 mg/dL — ABNORMAL HIGH (ref 8.4–10.5)
Chloride: 98 mEq/L (ref 96–112)
Creatinine, Ser: 1.19 mg/dL (ref 0.40–1.20)
GFR: 49.71 mL/min — ABNORMAL LOW (ref >60.00)
Glucose, Bld: 322 mg/dL — ABNORMAL HIGH (ref 70–99)
Potassium: 4 mEq/L (ref 3.5–5.1)
Sodium: 136 mEq/L (ref 135–145)
Total Bilirubin: 0.4 mg/dL (ref 0.2–1.2)
Total Protein: 8.3 g/dL (ref 6.0–8.3)

## 2021-02-06 LAB — LIPID PANEL
Cholesterol: 228 mg/dL — ABNORMAL HIGH (ref 0–200)
HDL: 61 mg/dL (ref >39.00)
LDL Cholesterol: 143 mg/dL — ABNORMAL HIGH (ref 0–99)
NonHDL: 167.2
Total CHOL/HDL Ratio: 4
Triglycerides: 122 mg/dL (ref 0.0–149.0)
VLDL: 24.4 mg/dL (ref 0.0–40.0)

## 2021-02-06 LAB — POCT GLYCOSYLATED HEMOGLOBIN (HGB A1C): Hemoglobin A1C: 10.8 % — AB (ref 4.0–5.6)

## 2021-02-06 NOTE — Progress Notes (Signed)
Established Patient Office Visit     This visit occurred during the SARS-CoV-2 public health emergency.  Safety protocols were in place, including screening questions prior to the visit, additional usage of staff PPE, and extensive cleaning of exam room while observing appropriate contact time as indicated for disinfecting solutions.    CC/Reason for Visit: 53-monthfollow-up chronic medical conditions  HPI: Lynn YGEM CONKLEis a 60y.o. female who is coming in today for the above mentioned reasons. Past Medical History is significant for: She has a very complex past medical history significant for insulin-dependent diabetes that has not been well controlled, hypertension that is not well controlled, hyperlipidemia with most recent LDL not at goal, history of coronary artery disease with non-STEMI, history of prior CVA with left-sided hemiparesis.  She has no acute concerns or complaints.  At last visit she was referred to endocrinology but I do not see where an appointment has been scheduled yet.  Her neck scheduled appointment with cardiology is in January.  She has no acute concerns or complaints.  She is due for an influenza vaccine.   Past Medical/Surgical History: Past Medical History:  Diagnosis Date   BENIGN NEOPLASM OF SKIN SITE UNSPECIFIED 09/08/2008   CEREBROVASCULAR DISEASE 12/11/2008   DIABETES MELLITUS, TYPE II 10/12/2006   Endometriosis    Fibroids    HYPERLIPIDEMIA 10/12/2006   HYPERTENSION 10/12/2006   PERIPHERAL NEUROPATHY 10/21/2006   Stroke ([facility]     Past Surgical History:  Procedure Laterality Date   ABDOMINAL HYSTERECTOMY     LEFT HEART CATHETERIZATION WITH CORONARY ANGIOGRAM N/A 03/24/2013   Procedure: LEFT HEART CATHETERIZATION WITH CORONARY ANGIOGRAM;  Surgeon: TTroy Sine MD;  [facility];  Service: Cardiovascular;  Laterality: N/A;    Social History:  reports that she has never smoked. She has never used smokeless tobacco. She reports that she  does not drink alcohol and does not use drugs.  Allergies: Allergies  Allergen Reactions   Tape Rash    Family History:  No history of heart disease, cancer, stroke that she is aware of   Current Outpatient Medications:    Accu-Chek Softclix Lancets lancets, Use 3 times daily to check glucose.  Dx E11.40 and Z79.4, Disp: 300 each, Rfl: 1   acetaminophen (TYLENOL) 650 MG CR tablet, Take 1,300 mg by mouth daily as needed for pain. , Disp: , Rfl:    amLODipine (NORVASC) 10 MG tablet, TAKE 1 TABLET BY MOUTH DAILY, Disp: 90 tablet, Rfl: 1   aspirin EC 81 MG tablet, Take 81 mg by mouth daily., Disp: , Rfl:    atorvastatin (LIPITOR) 80 MG tablet, Take 1 tablet (80 mg total) by mouth at bedtime., Disp: 90 tablet, Rfl: 1   BD PEN NEEDLE NANO 2ND GEN 32G X 4 MM MISC, USE FOUR TIMES DAILY, Disp: 100 each, Rfl: 6   Blood Glucose Monitoring Suppl (ACCU-CHEK GUIDE ME) w/Device KIT, 1 each by Does not apply route daily. E11.9, Disp: 1 kit, Rfl: 0   carvedilol (COREG) 25 MG tablet, Take 2 tablets (50 mg total) by mouth 2 (two) times daily., Disp: 360 tablet, Rfl: 1   chlorthalidone (HYGROTON) 50 MG tablet, TAKE 1 TABLET BY MOUTH EVERY DAY, Disp: 90 tablet, Rfl: 1   Cholecalciferol (VITAMIN D-3) 5000 units TABS, Take 1 tablet by mouth daily., Disp: , Rfl:    clopidogrel (PLAVIX) 75 MG tablet, Take 1 tablet (75 mg total) by mouth daily., Disp: 90 tablet, Rfl: 1  Continuous Blood Gluc Receiver ([place]) DEVI, 1 each by Does not apply route daily. E11.9, Disp: 1 each, Rfl: 1   Continuous Blood Gluc Sensor (DEXCOM G6 SENSOR) MISC, 1 each by Does not apply route daily. Dx e11.9, Disp: 3 each, Rfl: 3   Continuous Blood Gluc Transmit (DEXCOM G6 TRANSMITTER) MISC, 1 each by Does not apply route daily. E11.9, Disp: 1 each, Rfl: 3   diclofenac Sodium (VOLTAREN) 1 % GEL, Apply 4 g topically 4 (four) times daily., Disp: 100 g, Rfl: 1   diphenoxylate-atropine (LOMOTIL) 2.5-0.025 MG tablet, Take 1 tablet by  mouth 4 (four) times daily as needed for diarrhea or loose stools., Disp: 30 tablet, Rfl: 2   ezetimibe (ZETIA) 10 MG tablet, TAKE 1 TABLET(10 MG) BY MOUTH DAILY, Disp: 90 tablet, Rfl: 1   glucose blood (ACCU-CHEK GUIDE) test strip, 1 each by Other route 3 (three) times daily. Dx E11.40 and Z79.4, Disp: 300 each, Rfl: 1   insulin detemir (LEVEMIR FLEXTOUCH) 100 UNIT/ML FlexPen, Inject 48 Units into the skin at bedtime., Disp: 15 mL, Rfl: 3   losartan (COZAAR) 100 MG tablet, TAKE 1 TABLET(100 MG) BY MOUTH DAILY, Disp: 90 tablet, Rfl: 1   nitroGLYCERIN (NITROSTAT) 0.4 MG SL tablet, PLACE ONE TABLET UNDER THE TONGUE EVERY 5 MINUTES FOR 3 DOSES AS NEEDED FOR CHEST PAIN, Disp: 25 tablet, Rfl: 2   potassium chloride SA (KLOR-CON) 20 MEQ tablet, TAKE 1 TABLET BY MOUTH DAILY, Disp: 90 tablet, Rfl: 0   REPATHA SURECLICK 035 MG/ML SOAJ, INJECT 140 MG SUBCUTANEOUSLY EVERY 14 DAYS, Disp: 2 mL, Rfl: 11   spironolactone (ALDACTONE) 25 MG tablet, TAKE 1/2 TABLET(12.5 MG) BY MOUTH DAILY, Disp: 15 tablet, Rfl: 6   insulin aspart (NOVOLOG FLEXPEN) 100 UNIT/ML FlexPen, Inject 33 Units into the skin 3 (three) times daily with meals. INJECT 30 UNITS PRIOR TO BRUNCH AND DINNER. IF BLOOD SUGAR IS GREATER THAN 150, ADD AN ADDITIONAL 4 UNITS, Disp: 3 mL, Rfl: 11  Review of Systems:  Constitutional: Denies fever, chills, diaphoresis, appetite change and fatigue.  HEENT: Denies photophobia, eye pain, redness, hearing loss, ear pain, congestion, sore throat, rhinorrhea, sneezing, mouth sores, trouble swallowing, neck pain, neck stiffness and tinnitus.   Respiratory: Denies SOB, DOE, cough, chest tightness,  and wheezing.   Cardiovascular: Denies chest pain, palpitations and leg swelling.  Gastrointestinal: Denies nausea, vomiting, abdominal pain, diarrhea, constipation, blood in stool and abdominal distention.  Genitourinary: Denies dysuria, urgency, frequency, hematuria, flank pain and difficulty urinating.  Endocrine:  Denies: hot or cold intolerance, sweats, changes in hair or nails, polyuria, polydipsia. Musculoskeletal: Denies myalgias, back pain, joint swelling, arthralgias. Skin: Denies pallor, rash and wound.  Neurological: Denies dizziness, seizures, syncope, weakness, light-headedness, numbness and headaches.  Hematological: Denies adenopathy. Easy bruising, personal or family bleeding history  Psychiatric/Behavioral: Denies suicidal ideation, mood changes, confusion, nervousness, sleep disturbance and agitation    Physical Exam: Vitals:   02/06/21 1003  BP: 130/80  Pulse: 76  Temp: 98.1 F (36.7 C)  TempSrc: Oral  SpO2: 100%  Weight: 214 lb 12.8 oz (97.4 kg)    Body mass index is 36.87 kg/m.   Constitutional: NAD, calm, comfortable, ambulates with a [place] Eyes: PERRL, lids and conjunctivae normal, wears corrective lenses ENMT: Mucous membranes are moist.  Respiratory: clear to auscultation bilaterally, no wheezing, no crackles. Normal respiratory effort. No accessory muscle use.  Cardiovascular: Regular rate and rhythm, no murmurs / rubs / gallops. No extremity edema.  Psychiatric: Normal judgment  and insight. Alert and oriented x 3. Normal mood.    Impression and Plan:  Type 2 diabetes mellitus with diabetic neuropathy, with long-term current use of insulin ([place])  - Plan: POCT glycosylated hemoglobin (Hb A1C), AMB Referral to [place] -A1c continues to be elevated at 10.8. -Referral to endocrinology again placed today.  I believe she would be a good candidate for continuous blood glucose monitoring.  Essential hypertension  - Plan: AMB Referral to [place] control today.  Dyslipidemia  - Plan: AMB Referral to [place] lipids today.  Cerebrovascular disease -Noted, with resultant left hemiparesis.  Coronary artery disease involving native heart without angina pectoris, unspecified vessel or lesion  type -Has routine follow-up with cardiology scheduled for January.  Need for influenza vaccination -Flu vaccine administered today.  Time spent: 33 minutes reviewing chart, interviewing and examining patient and formulating plan of care.   Patient Instructions  -Nice seeing you today!!  -Lab work today; will notify you once results are available.  -Flu vaccine today.  -Make sure you call today for your endocrinology appointment.  -Schedule follow up in 3-4 months.    Lelon Frohlich, MD [facility]

## 2021-02-06 NOTE — Patient Instructions (Signed)
-  Nice seeing you today!!  -Lab work today; will notify you once results are available.  -Flu vaccine today.  -Make sure you call today for your endocrinology appointment.  -Schedule follow up in 3-4 months.

## 2021-02-06 NOTE — Addendum Note (Signed)
Addended by: Westley Hummer B on: 02/06/2021 05:21 PM   Modules accepted: Orders

## 2021-02-11 ENCOUNTER — Telehealth: Payer: Self-pay | Admitting: Internal Medicine

## 2021-02-11 NOTE — Chronic Care Management (AMB) (Signed)
  Chronic Care Management   Outreach Note  02/11/2021 Name: Lynn Hobbs MRN: [id number] DOB: 1960/09/30  Referred by: Isaac Bliss, Rayford Halsted, MD Reason for referral : No chief complaint on file.   An unsuccessful telephone outreach was attempted today. The patient was referred to the pharmacist for assistance with care management and care coordination.   Follow Up Plan:   Tatjana Dellinger Upstream Scheduler

## 2021-02-13 ENCOUNTER — Other Ambulatory Visit: Payer: Self-pay | Admitting: Internal Medicine

## 2021-02-13 DIAGNOSIS — E785 Hyperlipidemia, unspecified: Secondary | ICD-10-CM

## 2021-02-13 DIAGNOSIS — N183 Chronic kidney disease, stage 3 unspecified: Secondary | ICD-10-CM

## 2021-03-21 ENCOUNTER — Other Ambulatory Visit: Payer: Self-pay

## 2021-03-21 ENCOUNTER — Other Ambulatory Visit: Payer: Self-pay | Admitting: Cardiovascular Disease

## 2021-03-21 ENCOUNTER — Encounter: Payer: Self-pay | Admitting: Cardiovascular Disease

## 2021-03-21 ENCOUNTER — Ambulatory Visit (INDEPENDENT_AMBULATORY_CARE_PROVIDER_SITE_OTHER): Payer: Medicare Other | Admitting: Cardiovascular Disease

## 2021-03-21 DIAGNOSIS — I10 Essential (primary) hypertension: Secondary | ICD-10-CM | POA: Diagnosis not present

## 2021-03-21 DIAGNOSIS — E785 Hyperlipidemia, unspecified: Secondary | ICD-10-CM

## 2021-03-21 DIAGNOSIS — Z79899 Other long term (current) drug therapy: Secondary | ICD-10-CM

## 2021-03-21 DIAGNOSIS — Z794 Long term (current) use of insulin: Secondary | ICD-10-CM | POA: Diagnosis not present

## 2021-03-21 DIAGNOSIS — I2102 ST elevation (STEMI) myocardial infarction involving left anterior descending coronary artery: Secondary | ICD-10-CM

## 2021-03-21 DIAGNOSIS — I2510 Atherosclerotic heart disease of native coronary artery without angina pectoris: Secondary | ICD-10-CM | POA: Diagnosis not present

## 2021-03-21 DIAGNOSIS — E118 Type 2 diabetes mellitus with unspecified complications: Secondary | ICD-10-CM | POA: Diagnosis not present

## 2021-03-21 MED ORDER — REPATHA SURECLICK 140 MG/ML ~~LOC~~ SOAJ: 1.0000 | SUBCUTANEOUS | 0 refills | Status: DC

## 2021-03-21 MED ORDER — CLOPIDOGREL BISULFATE 75 MG PO TABS: 75.0000 mg | ORAL_TABLET | Freq: Every day | ORAL | 0 refills | Status: DC

## 2021-03-21 MED ORDER — EZETIMIBE 10 MG PO TABS: ORAL_TABLET | ORAL | 0 refills | Status: DC

## 2021-03-21 MED ORDER — NITROGLYCERIN 0.4 MG SL SUBL: SUBLINGUAL_TABLET | SUBLINGUAL | 2 refills | Status: DC

## 2021-03-21 MED ORDER — POTASSIUM CHLORIDE CRYS ER 20 MEQ PO TBCR: 20.0000 meq | EXTENDED_RELEASE_TABLET | Freq: Every day | ORAL | 0 refills | Status: DC

## 2021-03-21 MED ORDER — SPIRONOLACTONE 25 MG PO TABS: ORAL_TABLET | ORAL | 0 refills | Status: DC

## 2021-03-21 NOTE — Patient Instructions (Addendum)
Medication Instructions:  The current medical regimen is effective;  continue present plan and medications as directed. Please refer to the Current Medication list given to you today.   *If you need a refill on your cardiac medications before your next appointment, please call your pharmacy*  Lab Work: FASTING LIPID, CMET, CBC, TSH AND Lpa MIN-TO-LATE FEB OR MARCH  If you have labs (blood work) drawn today and your tests are completely normal, you will receive your results only by:  [place] (if you have MyChart) OR A paper copy in the mail.  If you have any lab test that is abnormal or we need to change your treatment, we will call you to review the results. You may go to any [facility] that is convenient for you however, we do have a lab in our office that is able to assist you. You DO NOT need an appointment for our lab. The lab is open 8:00am and closes at 4:00pm. Lunch 12:45 - 1:45pm.  Follow-Up: Your next appointment:  6 month(s) In Person with [name], MD   Please call our office 2 months in advance to schedule this appointment  At [facility], you and your health needs are our priority.  As part of our continuing mission to provide you with exceptional heart care, we have created designated Provider Care Teams.  These Care Teams include your primary Cardiologist (physician) and Advanced Practice Providers (APPs -  Physician Assistants and Nurse Practitioners) who all work together to provide you with the care you need, when you need it.
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
## 2021-03-28 ENCOUNTER — Other Ambulatory Visit: Payer: Self-pay | Admitting: Cardiovascular Disease

## 2021-03-28 DIAGNOSIS — I2510 Atherosclerotic heart disease of native coronary artery without angina pectoris: Secondary | ICD-10-CM

## 2021-03-28 MED ORDER — REPATHA SURECLICK 140 MG/ML ~~LOC~~ SOAJ: 1.0000 | SUBCUTANEOUS | 11 refills | Status: DC

## 2021-03-28 MED ORDER — CLOPIDOGREL BISULFATE 75 MG PO TABS: 75.0000 mg | ORAL_TABLET | Freq: Every day | ORAL | 3 refills | Status: DC

## 2021-03-28 NOTE — Telephone Encounter (Signed)
Clopidogrel 75 mg  e-scribed to pharmacy   Encounter routed to [facility]  to refill  Repatha

## 2021-04-01 ENCOUNTER — Encounter: Payer: Self-pay | Admitting: Internal Medicine

## 2021-04-06 ENCOUNTER — Encounter: Payer: Self-pay | Admitting: Cardiovascular Disease

## 2021-05-03 ENCOUNTER — Other Ambulatory Visit: Payer: Self-pay | Admitting: Cardiovascular Disease

## 2021-05-03 DIAGNOSIS — I2510 Atherosclerotic heart disease of native coronary artery without angina pectoris: Secondary | ICD-10-CM

## 2021-05-03 DIAGNOSIS — Z79899 Other long term (current) drug therapy: Secondary | ICD-10-CM

## 2021-05-03 DIAGNOSIS — I2102 ST elevation (STEMI) myocardial infarction involving left anterior descending coronary artery: Secondary | ICD-10-CM

## 2021-05-03 DIAGNOSIS — I10 Essential (primary) hypertension: Secondary | ICD-10-CM

## 2021-05-03 DIAGNOSIS — E785 Hyperlipidemia, unspecified: Secondary | ICD-10-CM

## 2021-05-06 ENCOUNTER — Encounter: Payer: Self-pay | Admitting: Cardiovascular Disease

## 2021-05-27 DIAGNOSIS — I10 Essential (primary) hypertension: Secondary | ICD-10-CM | POA: Diagnosis not present

## 2021-05-27 DIAGNOSIS — E785 Hyperlipidemia, unspecified: Secondary | ICD-10-CM | POA: Diagnosis not present

## 2021-05-27 DIAGNOSIS — I2102 ST elevation (STEMI) myocardial infarction involving left anterior descending coronary artery: Secondary | ICD-10-CM | POA: Diagnosis not present

## 2021-05-27 DIAGNOSIS — Z79899 Other long term (current) drug therapy: Secondary | ICD-10-CM | POA: Diagnosis not present

## 2021-05-27 DIAGNOSIS — I2510 Atherosclerotic heart disease of native coronary artery without angina pectoris: Secondary | ICD-10-CM | POA: Diagnosis not present

## 2021-05-28 LAB — COMPREHENSIVE METABOLIC PANEL
ALT: 20 IU/L (ref 0–32)
AST: 19 IU/L (ref 0–40)
Albumin/Globulin Ratio: 1.3 (ref 1.2–2.2)
Albumin: 4.4 g/dL (ref 3.8–4.9)
Alkaline Phosphatase: 63 IU/L (ref 44–121)
BUN/Creatinine Ratio: 22 (ref 12–28)
BUN: 24 mg/dL (ref 8–27)
Bilirubin Total: 0.3 mg/dL (ref 0.0–1.2)
CO2: 24 mmol/L (ref 20–29)
Calcium: 10 mg/dL (ref 8.7–10.3)
Chloride: 99 mmol/L (ref 96–106)
Creatinine, Ser: 1.1 mg/dL — ABNORMAL HIGH (ref 0.57–1.00)
Globulin, Total: 3.4 g/dL (ref 1.5–4.5)
Glucose: 303 mg/dL — ABNORMAL HIGH (ref 70–99)
Potassium: 4.1 mmol/L (ref 3.5–5.2)
Sodium: 140 mmol/L (ref 134–144)
Total Protein: 7.8 g/dL (ref 6.0–8.5)
eGFR: 58 mL/min/{1.73_m2} — ABNORMAL LOW (ref >59)

## 2021-05-28 LAB — LIPID PANEL
Chol/HDL Ratio: 1.7 ratio (ref 0.0–4.4)
Cholesterol, Total: 111 mg/dL (ref 100–199)
HDL: 65 mg/dL (ref >39)
LDL Chol Calc (NIH): 33 mg/dL (ref 0–99)
Triglycerides: 56 mg/dL (ref 0–149)
VLDL Cholesterol Cal: 13 mg/dL (ref 5–40)

## 2021-05-28 LAB — TSH: TSH: 2.19 u[IU]/mL (ref 0.450–4.500)

## 2021-05-28 LAB — CBC
Hematocrit: 36.9 % (ref 34.0–46.6)
Hemoglobin: 11.7 g/dL (ref 11.1–15.9)
MCH: 25.4 pg — ABNORMAL LOW (ref 26.6–33.0)
MCHC: 31.7 g/dL (ref 31.5–35.7)
MCV: 80 fL (ref 79–97)
Platelets: 176 10*3/uL (ref 150–450)
RBC: 4.6 x10E6/uL (ref 3.77–5.28)
RDW: 15.3 % (ref 11.7–15.4)
WBC: 8.1 10*3/uL (ref 3.4–10.8)

## 2021-05-28 LAB — LIPOPROTEIN A (LPA): Lipoprotein (a): 126.2 nmol/L — ABNORMAL HIGH (ref <75.0)

## 2021-06-05 ENCOUNTER — Other Ambulatory Visit: Payer: Self-pay | Admitting: Internal Medicine

## 2021-06-06 ENCOUNTER — Telehealth: Payer: Self-pay | Admitting: Internal Medicine

## 2021-06-06 ENCOUNTER — Encounter: Payer: Self-pay | Admitting: Cardiovascular Disease

## 2021-06-06 ENCOUNTER — Other Ambulatory Visit: Payer: Self-pay

## 2021-06-06 ENCOUNTER — Ambulatory Visit (INDEPENDENT_AMBULATORY_CARE_PROVIDER_SITE_OTHER): Payer: Medicare Other | Admitting: Internal Medicine

## 2021-06-06 ENCOUNTER — Encounter: Payer: Self-pay | Admitting: Internal Medicine

## 2021-06-06 VITALS — BP 140/80 | HR 80 | Temp 97.9°F | Wt 219.8 lb

## 2021-06-06 DIAGNOSIS — Z8673 Personal history of transient ischemic attack (TIA), and cerebral infarction without residual deficits: Secondary | ICD-10-CM | POA: Diagnosis not present

## 2021-06-06 DIAGNOSIS — N182 Chronic kidney disease, stage 2 (mild): Secondary | ICD-10-CM

## 2021-06-06 DIAGNOSIS — I2510 Atherosclerotic heart disease of native coronary artery without angina pectoris: Secondary | ICD-10-CM | POA: Diagnosis not present

## 2021-06-06 DIAGNOSIS — I10 Essential (primary) hypertension: Secondary | ICD-10-CM

## 2021-06-06 DIAGNOSIS — E1140 Type 2 diabetes mellitus with diabetic neuropathy, unspecified: Secondary | ICD-10-CM | POA: Diagnosis not present

## 2021-06-06 DIAGNOSIS — Z794 Long term (current) use of insulin: Secondary | ICD-10-CM

## 2021-06-06 DIAGNOSIS — E785 Hyperlipidemia, unspecified: Secondary | ICD-10-CM

## 2021-06-06 LAB — POCT GLYCOSYLATED HEMOGLOBIN (HGB A1C): Hemoglobin A1C: 10.1 % — AB (ref 4.0–5.6)

## 2021-06-06 MED ORDER — REPATHA SURECLICK 140 MG/ML ~~LOC~~ SOAJ: 1.0000 | SUBCUTANEOUS | 11 refills | Status: DC

## 2021-06-06 NOTE — Telephone Encounter (Signed)
Responded via Mychart.

## 2021-06-06 NOTE — Patient Instructions (Signed)
-  Nice seeing you today!! ? ?-Remember to call the endocrinology office for follow up. ? ?-Schedule follow up in 3 months. ?

## 2021-06-06 NOTE — Progress Notes (Signed)
? ? ? ?Established Patient Office Visit ? ? ? ? ?This visit occurred during the SARS-CoV-2 public health emergency.  Safety protocols were in place, including screening questions prior to the visit, additional usage of staff PPE, and extensive cleaning of exam room while observing appropriate contact time as indicated for disinfecting solutions.  ? ? ?CC/Reason for Visit: Follow-up chronic conditions ? ?HPI: Lynn Hobbs is a 61 y.o. female who is coming in today for the above mentioned reasons. Past Medical History is significant for:  She has a very complex past medical history significant for insulin-dependent diabetes that has not been well controlled, hypertension that is not well controlled, hyperlipidemia  history of coronary artery disease with non-STEMI, history of prior CVA with left-sided hemiparesis.  She has no acute concerns or complaints.  Since I last saw her she has been started on evolocumab due to a suboptimal LDL.  She has been referred twice to endocrinology but has not seen them yet.  Her A1c remains uncontrolled.  She saw her cardiologist in January.  Recent labs drawn by them show an LDL of 33. ? ? ?Past Medical/Surgical History: ?Past Medical History:  ?Diagnosis Date  ? BENIGN NEOPLASM OF SKIN SITE UNSPECIFIED 09/08/2008  ? CEREBROVASCULAR DISEASE 12/11/2008  ? DIABETES MELLITUS, TYPE II 10/12/2006  ? Endometriosis   ? Fibroids   ? HYPERLIPIDEMIA 10/12/2006  ? HYPERTENSION 10/12/2006  ? PERIPHERAL NEUROPATHY 10/21/2006  ? Stroke [facility])   ? ? ?Past Surgical History:  ?Procedure Laterality Date  ? ABDOMINAL HYSTERECTOMY    ? LEFT HEART CATHETERIZATION WITH CORONARY ANGIOGRAM N/A 03/24/2013  ? Procedure: LEFT HEART CATHETERIZATION WITH CORONARY ANGIOGRAM;  Surgeon: Troy Sine, MD;  Location: [facility] CATH LAB;  Service: Cardiovascular;  Laterality: N/A;  ? ? ?Social History: ? reports that she has never smoked. She has never used smokeless tobacco. She reports that she does not drink alcohol and does  not use drugs. ? ?Allergies: ?Allergies  ?Allergen Reactions  ? Tape Rash  ? ? ?Family History:  ?No history of heart attack, cancer, stroke that she is aware of ? ? ?Current Outpatient Medications:  ?  Accu-Chek Softclix Lancets lancets, Use 3 times daily to check glucose.  Dx E11.40 and Z79.4, Disp: 300 each, Rfl: 1 ?  acetaminophen (TYLENOL) 650 MG CR tablet, Take 1,300 mg by mouth daily as needed for pain. , Disp: , Rfl:  ?  amLODipine (NORVASC) 10 MG tablet, TAKE 1 TABLET BY MOUTH DAILY, Disp: 90 tablet, Rfl: 1 ?  aspirin EC 81 MG tablet, Take 81 mg by mouth daily., Disp: , Rfl:  ?  atorvastatin (LIPITOR) 80 MG tablet, Take 1 tablet (80 mg total) by mouth at bedtime., Disp: 90 tablet, Rfl: 1 ?  BD PEN NEEDLE NANO 2ND GEN 32G X 4 MM MISC, USE FOUR TIMES DAILY, Disp: 100 each, Rfl: 6 ?  Blood Glucose Monitoring Suppl (ACCU-CHEK GUIDE ME) w/Device KIT, 1 each by Does not apply route daily. E11.9, Disp: 1 kit, Rfl: 0 ?  carvedilol (COREG) 25 MG tablet, Take 2 tablets (50 mg total) by mouth 2 (two) times daily., Disp: 360 tablet, Rfl: 1 ?  chlorthalidone (HYGROTON) 50 MG tablet, TAKE 1 TABLET BY MOUTH EVERY DAY, Disp: 90 tablet, Rfl: 1 ?  Cholecalciferol (VITAMIN D-3) 5000 units TABS, Take 1 tablet by mouth daily., Disp: , Rfl:  ?  clopidogrel (PLAVIX) 75 MG tablet, Take 1 tablet (75 mg total) by mouth daily., Disp: 90 tablet, Rfl: 3 ?  Continuous Blood Gluc Receiver ([place]) DEVI, 1 each by Does not apply route daily. E11.9, Disp: 1 each, Rfl: 1 ?  Continuous Blood Gluc Sensor (DEXCOM G6 SENSOR) MISC, 1 each by Does not apply route daily. Dx e11.9, Disp: 3 each, Rfl: 3 ?  Continuous Blood Gluc Transmit (DEXCOM G6 TRANSMITTER) MISC, 1 each by Does not apply route daily. E11.9, Disp: 1 each, Rfl: 3 ?  diclofenac Sodium (VOLTAREN) 1 % GEL, Apply 4 g topically 4 (four) times daily., Disp: 100 g, Rfl: 1 ?  diphenoxylate-atropine (LOMOTIL) 2.5-0.025 MG tablet, Take 1 tablet by mouth 4 (four) times daily as  needed for diarrhea or loose stools., Disp: 30 tablet, Rfl: 2 ?  Evolocumab (REPATHA SURECLICK) 025 MG/ML SOAJ, Inject 1 Dose into the skin every 14 (fourteen) days., Disp: 2 mL, Rfl: 11 ?  ezetimibe (ZETIA) 10 MG tablet, TAKE 1 TABLET(10 MG) BY MOUTH DAILY, Disp: 30 tablet, Rfl: 0 ?  glucose blood (ACCU-CHEK GUIDE) test strip, 1 each by Other route 3 (three) times daily. Dx E11.40 and Z79.4, Disp: 300 each, Rfl: 1 ?  insulin detemir (LEVEMIR FLEXTOUCH) 100 UNIT/ML FlexPen, Inject 48 Units into the skin at bedtime., Disp: 15 mL, Rfl: 3 ?  losartan (COZAAR) 100 MG tablet, TAKE 1 TABLET(100 MG) BY MOUTH DAILY, Disp: 90 tablet, Rfl: 1 ?  nitroGLYCERIN (NITROSTAT) 0.4 MG SL tablet, PLACE ONE TABLET UNDER THE TONGUE EVERY 5 MINUTES FOR 3 DOSES AS NEEDED FOR CHEST PAIN, Disp: 25 tablet, Rfl: 2 ?  potassium chloride SA (KLOR-CON M) 20 MEQ tablet, TAKE 1 TABLET BY MOUTH DAILY, Disp: 90 tablet, Rfl: 1 ?  spironolactone (ALDACTONE) 25 MG tablet, TAKE 1/2 TABLET(12.5 MG) BY MOUTH DAILY, Disp: 90 tablet, Rfl: 1 ?  insulin aspart (NOVOLOG FLEXPEN) 100 UNIT/ML FlexPen, Inject 33 Units into the skin 3 (three) times daily with meals. INJECT 30 UNITS PRIOR TO BRUNCH AND DINNER. IF BLOOD SUGAR IS GREATER THAN 150, ADD AN ADDITIONAL 4 UNITS, Disp: 3 mL, Rfl: 11 ? ?Review of Systems:  ?Constitutional: Denies fever, chills, diaphoresis, appetite change and fatigue.  ?HEENT: Denies photophobia, eye pain, redness, hearing loss, ear pain, congestion, sore throat, rhinorrhea, sneezing, mouth sores, trouble swallowing, neck pain, neck stiffness and tinnitus.   ?Respiratory: Denies SOB, DOE, cough, chest tightness,  and wheezing.   ?Cardiovascular: Denies chest pain, palpitations and leg swelling.  ?Gastrointestinal: Denies nausea, vomiting, abdominal pain, diarrhea, constipation, blood in stool and abdominal distention.  ?Genitourinary: Denies dysuria, urgency, frequency, hematuria, flank pain and difficulty urinating.  ?Endocrine: Denies:  hot or cold intolerance, sweats, changes in hair or nails, polyuria, polydipsia. ?Musculoskeletal: Denies myalgias, back pain, joint swelling, arthralgias and gait problem.  ?Skin: Denies pallor, rash and wound.  ?Neurological: Denies dizziness, seizures, syncope, weakness, light-headedness, numbness and headaches.  ?Hematological: Denies adenopathy. Easy bruising, personal or family bleeding history  ?Psychiatric/Behavioral: Denies suicidal ideation, mood changes, confusion, nervousness, sleep disturbance and agitation ? ? ? ?Physical Exam: ?Vitals:  ? 06/06/21 1055  ?BP: (!) 160/110  ?Pulse: 80  ?Temp: 97.9 ?F (36.6 ?C)  ?TempSrc: Oral  ?SpO2: 100%  ?Weight: 219 lb 12.8 oz (99.7 kg)  ? ? ?Body mass index is 37.73 kg/m?. ? ? ?Constitutional: NAD, calm, comfortable ?Eyes: PERRL, lids and conjunctivae normal, wears corrective lenses ?ENMT: Mucous membranes are moist.  ?Respiratory: clear to auscultation bilaterally, no wheezing, no crackles. Normal respiratory effort. No accessory muscle use.  ?Cardiovascular: Regular rate and rhythm, no murmurs / rubs / gallops. No  extremity edema.  ?Psychiatric: Normal judgment and insight. Alert and oriented x 3. Normal mood.  ? ? ?Impression and Plan: ? ?Type 2 diabetes mellitus with diabetic neuropathy, with long-term current use of insulin ([place])  ?- Plan: POCT glycosylated hemoglobin (Hb A1C), Ambulatory referral to Endocrinology, CANCELED: Hemoglobin A1c ?-Her diabetes continues to be uncontrolled with an A1c of 10.1.  Since he is insulin-dependent I believe best course of action is refer her to endocrinology.  Referral has been yet again placed today and patient has been given phone number to contact their office directly. ? ?Dyslipidemia ?-On maximum dose atorvastatin, ezetimibe and evolocumab, last lipid panel showed an LDL of 33. ? ?Essential hypertension ?-Relatively well controlled, followed by cardiology ? ?CAD in native artery ?-Stable, no chest pain, no dyspnea on  exertion, followed by cardiology. ? ?H/O: CVA (cerebrovascular accident) ?-Noted, has left hemiparesis ? ?Stage 2 chronic kidney disease ?-Noted, refer to nephrology. ? ?Morbid obesity ([place]) ?-Discussed healt

## 2021-06-06 NOTE — Telephone Encounter (Signed)
Patient called in requesting for Apolonio Schneiders to mail letter to address on file for patient. ? ?Address: [address] ?[place] [address] ? ?Please advise. ?

## 2021-07-11 ENCOUNTER — Telehealth: Payer: Self-pay | Admitting: Internal Medicine

## 2021-07-11 NOTE — Telephone Encounter (Signed)
Pt dropped off forms to be signed by Dr. Elyn Peers are placed in folder.  ? ?Please advise.  ?

## 2021-07-11 NOTE — Telephone Encounter (Signed)
Placed in Dr Hernandez's folder 

## 2021-07-11 NOTE — Telephone Encounter (Signed)
Patient is aware. ?Document is faxed  ?

## 2021-07-15 ENCOUNTER — Other Ambulatory Visit: Payer: Self-pay | Admitting: Internal Medicine

## 2021-07-15 DIAGNOSIS — I10 Essential (primary) hypertension: Secondary | ICD-10-CM

## 2021-07-27 ENCOUNTER — Other Ambulatory Visit: Payer: Self-pay | Admitting: Internal Medicine

## 2021-07-27 DIAGNOSIS — E785 Hyperlipidemia, unspecified: Secondary | ICD-10-CM

## 2021-08-20 ENCOUNTER — Other Ambulatory Visit: Payer: Self-pay | Admitting: Internal Medicine

## 2021-08-20 DIAGNOSIS — N182 Chronic kidney disease, stage 2 (mild): Secondary | ICD-10-CM

## 2021-09-16 ENCOUNTER — Ambulatory Visit (INDEPENDENT_AMBULATORY_CARE_PROVIDER_SITE_OTHER): Payer: Medicare Other | Admitting: Internal Medicine

## 2021-09-16 ENCOUNTER — Encounter: Payer: Self-pay | Admitting: Internal Medicine

## 2021-09-16 VITALS — BP 153/80 | HR 79 | Temp 98.5°F | Wt 216.6 lb

## 2021-09-16 DIAGNOSIS — I2102 ST elevation (STEMI) myocardial infarction involving left anterior descending coronary artery: Secondary | ICD-10-CM | POA: Diagnosis not present

## 2021-09-16 DIAGNOSIS — E1140 Type 2 diabetes mellitus with diabetic neuropathy, unspecified: Secondary | ICD-10-CM

## 2021-09-16 DIAGNOSIS — E785 Hyperlipidemia, unspecified: Secondary | ICD-10-CM | POA: Diagnosis not present

## 2021-09-16 DIAGNOSIS — I10 Essential (primary) hypertension: Secondary | ICD-10-CM | POA: Diagnosis not present

## 2021-09-16 DIAGNOSIS — Z794 Long term (current) use of insulin: Secondary | ICD-10-CM | POA: Diagnosis not present

## 2021-09-16 DIAGNOSIS — I679 Cerebrovascular disease, unspecified: Secondary | ICD-10-CM | POA: Diagnosis not present

## 2021-09-16 DIAGNOSIS — Z79899 Other long term (current) drug therapy: Secondary | ICD-10-CM | POA: Diagnosis not present

## 2021-09-16 DIAGNOSIS — I2510 Atherosclerotic heart disease of native coronary artery without angina pectoris: Secondary | ICD-10-CM

## 2021-09-16 DIAGNOSIS — N182 Chronic kidney disease, stage 2 (mild): Secondary | ICD-10-CM

## 2021-09-16 LAB — POCT GLYCOSYLATED HEMOGLOBIN (HGB A1C): Hemoglobin A1C: 10.7 % — AB (ref 4.0–5.6)

## 2021-09-16 MED ORDER — SPIRONOLACTONE 25 MG PO TABS: 25.0000 mg | ORAL_TABLET | Freq: Every day | ORAL | 1 refills | Status: DC

## 2021-09-16 MED ORDER — LOSARTAN POTASSIUM 100 MG PO TABS: ORAL_TABLET | ORAL | 1 refills | Status: DC

## 2021-09-16 MED ORDER — AMLODIPINE BESYLATE 10 MG PO TABS: 10.0000 mg | ORAL_TABLET | Freq: Every day | ORAL | 1 refills | Status: DC

## 2021-09-16 MED ORDER — CHLORTHALIDONE 50 MG PO TABS: 50.0000 mg | ORAL_TABLET | Freq: Every day | ORAL | 1 refills | Status: DC

## 2021-09-16 NOTE — Progress Notes (Signed)
Established Patient Office Visit     CC/Reason for Visit: 11-monthfollow-up chronic medical conditions  HPI: Lynn Lynn SHEDLOCKis a 61y.o. female who is coming in today for the above mentioned reasons. Past Medical History is significant for: She has a very complex past medical history significant for insulin-dependent diabetes that has not been well controlled, hypertension that is not well controlled, hyperlipidemia  history of coronary artery disease with non-STEMI, history of prior CVA with left-sided hemiparesis.  Her blood pressure and diabetes remain uncontrolled.  Reports appointment to see endocrinology is not till December despite [place] in March.  She has no concerns or complaints.  Reviewing medication list it appears she has not been taking spironolactone.  She appears to be compliant with amlodipine, losartan, chlorthalidone and carvedilol.  Despite her mainly stating she is supposed to be on 40 units of Levemir it appears she has been doing 426  She also does 32 units of NovoLog twice daily with meals.  She only eats 2 meals a day.   Past Medical/Surgical History: Past Medical History:  Diagnosis Date   BENIGN NEOPLASM OF SKIN SITE UNSPECIFIED 09/08/2008   CEREBROVASCULAR DISEASE 12/11/2008   DIABETES MELLITUS, TYPE II 10/12/2006   Endometriosis    Fibroids    HYPERLIPIDEMIA 10/12/2006   HYPERTENSION 10/12/2006   PERIPHERAL NEUROPATHY 10/21/2006   Stroke ([facility]     Past Surgical History:  Procedure Laterality Date   ABDOMINAL HYSTERECTOMY     LEFT HEART CATHETERIZATION WITH CORONARY ANGIOGRAM N/A 03/24/2013   Procedure: LEFT HEART CATHETERIZATION WITH CORONARY ANGIOGRAM;  Surgeon: TTroy Sine MD;  [facility];  Service: Cardiovascular;  Laterality: N/A;    Social History:  reports that she has never smoked. She has never used smokeless tobacco. She reports that she does not drink alcohol and does not use drugs.  Allergies: Allergies  Allergen  Reactions   Tape Rash    Family History:  No history of heart disease, cancer, stroke that she is aware of   Current Outpatient Medications:    Accu-Chek Softclix Lancets lancets, Use 3 times daily to check glucose.  Dx E11.40 and Z79.4, Disp: 300 each, Rfl: 1   acetaminophen (TYLENOL) 650 MG CR tablet, Take 1,300 mg by mouth daily as needed for pain. , Disp: , Rfl:    aspirin EC 81 MG tablet, Take 81 mg by mouth daily., Disp: , Rfl:    atorvastatin (LIPITOR) 80 MG tablet, Take 1 tablet (80 mg total) by mouth at bedtime., Disp: 90 tablet, Rfl: 1   BD PEN NEEDLE NANO 2ND GEN 32G X 4 MM MISC, USE FOUR TIMES DAILY, Disp: 100 each, Rfl: 6   Blood Glucose Monitoring Suppl (ACCU-CHEK GUIDE ME) w/Device KIT, 1 each by Does not apply route daily. E11.9, Disp: 1 kit, Rfl: 0   carvedilol (COREG) 25 MG tablet, Take 2 tablets (50 mg total) by mouth 2 (two) times daily., Disp: 360 tablet, Rfl: 1   Cholecalciferol (VITAMIN D-3) 5000 units TABS, Take 1 tablet by mouth daily., Disp: , Rfl:    clopidogrel (PLAVIX) 75 MG tablet, Take 1 tablet (75 mg total) by mouth daily., Disp: 90 tablet, Rfl: 3   Continuous Blood Gluc Receiver ([place] DEVI, 1 each by Does not apply route daily. E11.9, Disp: 1 each, Rfl: 1   Continuous Blood Gluc Sensor (DEXCOM G6 SENSOR) MISC, 1 each by Does not apply route daily. Dx e11.9, Disp: 3 each,  Rfl: 3   Continuous Blood Gluc Transmit (DEXCOM G6 TRANSMITTER) MISC, 1 each by Does not apply route daily. E11.9, Disp: 1 each, Rfl: 3   diclofenac Sodium (VOLTAREN) 1 % GEL, APPLY 4 GRAMS TOPICALLY TO THE AFFECTED AREA FOUR TIMES DAILY, Disp: 100 g, Rfl: 1   diphenoxylate-atropine (LOMOTIL) 2.5-0.025 MG tablet, Take 1 tablet by mouth 4 (four) times daily as needed for diarrhea or loose stools., Disp: 30 tablet, Rfl: 2   Evolocumab (REPATHA SURECLICK) 798 MG/ML SOAJ, Inject 1 Dose into the skin every 14 (fourteen) days., Disp: 2 mL, Rfl: 11   ezetimibe (ZETIA) 10 MG tablet,  TAKE 1 TABLET(10 MG) BY MOUTH DAILY, Disp: 90 tablet, Rfl: 1   glucose blood (ACCU-CHEK GUIDE) test strip, 1 each by Other route 3 (three) times daily. Dx E11.40 and Z79.4, Disp: 300 each, Rfl: 1   insulin detemir (LEVEMIR FLEXTOUCH) 100 UNIT/ML FlexPen, Inject 48 Units into the skin at bedtime., Disp: 15 mL, Rfl: 3   nitroGLYCERIN (NITROSTAT) 0.4 MG SL tablet, PLACE ONE TABLET UNDER THE TONGUE EVERY 5 MINUTES FOR 3 DOSES AS NEEDED FOR CHEST PAIN, Disp: 25 tablet, Rfl: 2   potassium chloride SA (KLOR-CON M) 20 MEQ tablet, TAKE 1 TABLET BY MOUTH DAILY, Disp: 90 tablet, Rfl: 1   amLODipine (NORVASC) 10 MG tablet, Take 1 tablet (10 mg total) by mouth daily., Disp: 90 tablet, Rfl: 1   chlorthalidone (HYGROTON) 50 MG tablet, Take 1 tablet (50 mg total) by mouth daily., Disp: 90 tablet, Rfl: 1   insulin aspart (NOVOLOG FLEXPEN) 100 UNIT/ML FlexPen, Inject 33 Units into the skin 3 (three) times daily with meals. INJECT 30 UNITS PRIOR TO BRUNCH AND DINNER. IF BLOOD SUGAR IS GREATER THAN 150, ADD AN ADDITIONAL 4 UNITS, Disp: 3 mL, Rfl: 11   losartan (COZAAR) 100 MG tablet, TAKE 1 TABLET(100 MG) BY MOUTH DAILY, Disp: 90 tablet, Rfl: 1   spironolactone (ALDACTONE) 25 MG tablet, Take 1 tablet (25 mg total) by mouth daily., Disp: 90 tablet, Rfl: 1  Review of Systems:  Constitutional: Denies fever, chills, diaphoresis, appetite change and fatigue.  HEENT: Denies photophobia, eye pain, redness, hearing loss, ear pain, congestion, sore throat, rhinorrhea, sneezing, mouth sores, trouble swallowing, neck pain, neck stiffness and tinnitus.   Respiratory: Denies SOB, DOE, cough, chest tightness,  and wheezing.   Cardiovascular: Denies chest pain, palpitations and leg swelling.  Gastrointestinal: Denies nausea, vomiting, abdominal pain, diarrhea, constipation, blood in stool and abdominal distention.  Genitourinary: Denies dysuria, urgency, frequency, hematuria, flank pain and difficulty urinating.  Endocrine: Denies:  hot or cold intolerance, sweats, changes in hair or nails, polyuria, polydipsia. Musculoskeletal: Denies myalgias, back pain, joint swelling, arthralgias and gait problem.  Skin: Denies pallor, rash and wound.  Neurological: Denies dizziness, seizures, syncope, weakness, light-headedness, numbness and headaches.  Hematological: Denies adenopathy. Easy bruising, personal or family bleeding history  Psychiatric/Behavioral: Denies suicidal ideation, mood changes, confusion, nervousness, sleep disturbance and agitation    Physical Exam: Vitals:   09/16/21 1121 09/16/21 1128  BP: (!) 150/80 (!) 153/80  Pulse: 79   Temp: 98.5 F (36.9 C)   TempSrc: Oral   SpO2: 100%   Weight: 216 lb 9.6 oz (98.2 kg)     Body mass index is 37.18 kg/m.   Constitutional: NAD, calm, comfortable Eyes: PERRL, lids and conjunctivae normal, wears corrective lenses ENMT: Mucous membranes are moist.  Respiratory: clear to auscultation bilaterally, no wheezing, no crackles. Normal respiratory effort. No accessory muscle use.  Cardiovascular:  Regular rate and rhythm, no murmurs / rubs / gallops. No extremity edema.  Psychiatric: Normal judgment and insight. Alert and oriented x 3. Normal mood.    Impression and Plan:  Type 2 diabetes mellitus with diabetic neuropathy, with long-term current use of insulin ([place]) - Plan: POCT glycosylated hemoglobin (Hb A1C), Urine microalbumin-creatinine with uACR  Essential hypertension - Plan: chlorthalidone (HYGROTON) 50 MG tablet, amLODipine (NORVASC) 10 MG tablet, losartan (COZAAR) 100 MG tablet, spironolactone (ALDACTONE) 25 MG tablet  Stage 2 chronic kidney disease  Dyslipidemia  Morbid obesity ([place])  Cerebrovascular disease  Coronary artery disease involving native coronary artery of native heart without angina pectoris  CAD in native artery - Plan: spironolactone (ALDACTONE) 25 MG tablet  Hyperlipidemia with target LDL less than 70 - Plan: spironolactone  (ALDACTONE) 25 MG tablet  ST elevation myocardial infarction involving left anterior descending (LAD) coronary artery ([place]): March 23, 2013 - Plan: spironolactone (ALDACTONE) 25 MG tablet  Medication management - Plan: spironolactone (ALDACTONE) 25 MG tablet  -She will increase Levemir from 40 to 1 unit for breath. -I have instructed her to start spironolactone 25 mg daily as it is listed on her medication list. -She will return for follow-up in 3 months.  Time spent:33 minutes reviewing chart, interviewing and examining patient and formulating plan of care.   Patient Instructions  -Nice seeing you today!!  -Make sure you are taking spironolactone 25 mg daily.  -Increase levemir to 48 units at bedtime.  -Schedule follow up in 3 months.    Lelon Frohlich, MD [facility]

## 2021-09-16 NOTE — Patient Instructions (Signed)
-  Nice seeing you today!!  -Make sure you are taking spironolactone 25 mg daily.  -Increase levemir to 48 units at bedtime.  -Schedule follow up in 3 months.

## 2021-09-29 ENCOUNTER — Other Ambulatory Visit: Payer: Self-pay | Admitting: Internal Medicine

## 2021-09-29 DIAGNOSIS — E785 Hyperlipidemia, unspecified: Secondary | ICD-10-CM

## 2021-09-29 DIAGNOSIS — I10 Essential (primary) hypertension: Secondary | ICD-10-CM

## 2021-10-08 ENCOUNTER — Other Ambulatory Visit: Payer: Self-pay | Admitting: *Deleted

## 2021-10-08 ENCOUNTER — Encounter: Payer: Self-pay | Admitting: *Deleted

## 2021-10-08 NOTE — Patient Outreach (Signed)
  Care Coordination   Initial Visit Note   10/08/2021 Name: KIARI HOSMER MRN: [id number] DOB: 03/22/1960  Evita MATIE DIMAANO is a 61 y.o. year old female who sees Isaac Bliss, Rayford Halsted, MD for primary care. I spoke with  [name] by phone today  What matters to the patients health and wellness today?  No needs   Goals Addressed               This Visit's Progress     "Don't have any needs at this time" (pt-stated)        Care Coordination Interventions: Provided education to patient and/or caregiver about advanced directives Reviewed medications with patient and discussed purpose of all medications Social Work referral for agencies or resources that provide Safety rales in the bathroom Screening for signs and symptoms of depression related to chronic disease state  Assessed social determinant of health barriers          SDOH assessments and interventions completed:   Yes SDOH Interventions Today    Flowsheet Row Most Recent Value  SDOH Interventions   Food Insecurity Interventions Intervention Not Indicated  Housing Interventions Intervention Not Indicated  Transportation Interventions Intervention Not Indicated       Care Coordination Interventions Activated:  Yes Care Coordination Interventions:  Yes, provided  Follow up plan: No further intervention required.  Encounter Outcome:  Pt. Visit Completed  Raina Mina, RN Care Management Coordinator [place] Office [phone]

## 2021-10-08 NOTE — Patient Instructions (Signed)
Visit Information  Thank you for taking time to visit with me today. Please don't hesitate to contact me if I can be of assistance to you before our next scheduled telephone appointment.  Following are the goals we discussed today:   Please  if you are experiencing a Mental Health or [place] or need someone to talk to.    Raina Mina, RN Care Management Coordinator [place] Office [phone]

## 2021-10-14 ENCOUNTER — Ambulatory Visit: Payer: Self-pay

## 2021-10-14 NOTE — Patient Outreach (Signed)
  Care Coordination   10/14/2021 Name: Lynn Hobbs MRN: [id number] DOB: 12/01/1960   Care Coordination Outreach Attempts:  An unsuccessful telephone outreach was attempted today to offer the patient information about available care coordination services as a benefit of their health plan.   Follow Up Plan:  Additional outreach attempts will be made to offer the patient care coordination information and services.   Encounter Outcome:  No Answer  Care Coordination Interventions Activated:  No   Care Coordination Interventions:  No, not indicated    Daneen Schick, BSW, CDP Social Worker, Certified Dementia Practitioner Care Coordination [phone]

## 2021-10-17 ENCOUNTER — Ambulatory Visit: Payer: Self-pay

## 2021-10-17 NOTE — Patient Outreach (Signed)
  Care Coordination   10/17/2021 Name: Lynn Hobbs MRN: [id number] DOB: 05/01/1960   Care Coordination Outreach Attempts:  A second unsuccessful outreach was attempted today to offer the patient with information about available care coordination services as a benefit of their health plan.     Follow Up Plan:  Additional outreach attempts will be made to offer the patient care coordination information and services.   Encounter Outcome:  No Answer  Care Coordination Interventions Activated:  No   Care Coordination Interventions:  No, not indicated    Daneen Schick, BSW, CDP Social Worker, Certified Dementia Practitioner Care Coordination [phone]

## 2021-10-22 ENCOUNTER — Ambulatory Visit: Payer: Self-pay

## 2021-10-22 NOTE — Patient Outreach (Signed)
  Care Coordination   Follow Up Visit Note   10/22/2021 Name: DENETRA FORMOSO MRN: [id number] DOB: 1961-03-10  Sinia CHENILLE TOOR is a 61 y.o. year old female who sees Isaac Bliss, Rayford Halsted, MD for primary care. I spoke with  [name] by phone today  What matters to the patients health and wellness today?  I need grab bars in the bath tub    Goals Addressed             This Visit's Progress    COMPLETED: Care Coordination Activities       Care Coordination Interventions: Telephonic visit completed to determine the patient lives in the home with her mother and is in need of grab bars in the bathtub Discussed the patient and her mother rent the home and have already contacted [employer] who were unable to help due to not owning the home Education provided on [employer] who assists [place] residents aged 88 and older with home modifications whether the applicant rents or owns the home Discussed the patients mother will need to be who calls considering she meets the age requirement Provided contact number to [place] [phone]) advising the patient and her mother contact SW as needed        SDOH assessments and interventions completed:  No     Care Coordination Interventions Activated:  Yes  Care Coordination Interventions:  Yes, provided   Follow up plan: Referral made to [place].    Encounter Outcome:  Pt. Visit Completed   Daneen Schick, BSW, CDP Social Worker, Certified Dementia Practitioner Care Coordination [phone]

## 2021-10-22 NOTE — Patient Instructions (Signed)
Visit Information  Thank you for taking time to visit with me today. Please don't hesitate to contact me if I can be of assistance to you.   Following are the goals we discussed today:   Goals Addressed             This Visit's Progress    COMPLETED: Care Coordination Activities       Care Coordination Interventions: Telephonic visit completed to determine the patient lives in the home with her mother and is in need of grab bars in the bathtub Discussed the patient and her mother rent the home and have already contacted [employer] who were unable to help due to not owning the home Education provided on [employer] who assists [place] residents aged 3 and older with home modifications whether the applicant rents or owns the home Discussed the patients mother will need to be who calls considering she meets the age requirement Provided contact number to [place] [phone]) advising the patient and her mother contact SW as needed        Please call the care guide team at [phone] if you need to schedule an appointment with me   If you are experiencing a Mental Health or [place] or need someone to talk to, please call 1-800-273-TALK (toll free, 24 hour hotline)  Patient verbalizes understanding of instructions and care plan provided today and agrees to view in [place]. Active MyChart status and patient understanding of how to access instructions and care plan via MyChart confirmed with patient.     No further follow up required: Please contact [place] at [phone] regarding grab bars.   Daneen Schick, BSW, CDP Social Worker, Certified Dementia Practitioner Care Coordination [phone]

## 2021-12-15 ENCOUNTER — Other Ambulatory Visit: Payer: Self-pay | Admitting: Internal Medicine

## 2021-12-15 DIAGNOSIS — I2510 Atherosclerotic heart disease of native coronary artery without angina pectoris: Secondary | ICD-10-CM

## 2021-12-15 DIAGNOSIS — E785 Hyperlipidemia, unspecified: Secondary | ICD-10-CM

## 2021-12-15 DIAGNOSIS — I2102 ST elevation (STEMI) myocardial infarction involving left anterior descending coronary artery: Secondary | ICD-10-CM

## 2021-12-15 DIAGNOSIS — Z79899 Other long term (current) drug therapy: Secondary | ICD-10-CM

## 2021-12-15 DIAGNOSIS — I10 Essential (primary) hypertension: Secondary | ICD-10-CM

## 2021-12-17 ENCOUNTER — Ambulatory Visit (INDEPENDENT_AMBULATORY_CARE_PROVIDER_SITE_OTHER): Payer: Medicare Other | Admitting: Internal Medicine

## 2021-12-17 ENCOUNTER — Encounter: Payer: Self-pay | Admitting: Internal Medicine

## 2021-12-17 ENCOUNTER — Other Ambulatory Visit: Payer: Self-pay

## 2021-12-17 VITALS — BP 130/76 | HR 74 | Temp 98.0°F | Wt 214.9 lb

## 2021-12-17 DIAGNOSIS — Z794 Long term (current) use of insulin: Secondary | ICD-10-CM | POA: Diagnosis not present

## 2021-12-17 DIAGNOSIS — E785 Hyperlipidemia, unspecified: Secondary | ICD-10-CM | POA: Diagnosis not present

## 2021-12-17 DIAGNOSIS — N182 Chronic kidney disease, stage 2 (mild): Secondary | ICD-10-CM | POA: Diagnosis not present

## 2021-12-17 DIAGNOSIS — Z23 Encounter for immunization: Secondary | ICD-10-CM | POA: Diagnosis not present

## 2021-12-17 DIAGNOSIS — I2510 Atherosclerotic heart disease of native coronary artery without angina pectoris: Secondary | ICD-10-CM | POA: Diagnosis not present

## 2021-12-17 DIAGNOSIS — E1140 Type 2 diabetes mellitus with diabetic neuropathy, unspecified: Secondary | ICD-10-CM

## 2021-12-17 DIAGNOSIS — I679 Cerebrovascular disease, unspecified: Secondary | ICD-10-CM

## 2021-12-17 DIAGNOSIS — I10 Essential (primary) hypertension: Secondary | ICD-10-CM | POA: Diagnosis not present

## 2021-12-17 LAB — POCT GLYCOSYLATED HEMOGLOBIN (HGB A1C): Hemoglobin A1C: 8.9 % — AB (ref 4.0–5.6)

## 2021-12-17 MED ORDER — DICLOFENAC SODIUM 1 % EX GEL: CUTANEOUS | 1 refills | Status: DC

## 2021-12-17 NOTE — Progress Notes (Signed)
Established Patient Office Visit     CC/Reason for Visit: 15-monthfollow-up chronic medical conditions  HPI: Lynn YVANASSA PENNIMANis a 61y.o. female who is coming in today for the above mentioned reasons. Past Medical History is significant for: She has a very complex past medical history significant for insulin-dependent diabetes that has not been well controlled, hypertension that is not well controlled, hyperlipidemia  history of coronary artery disease with non-STEMI, history of prior CVA with left-sided hemiparesis.  She is doing well today, has no acute concerns or complaints.  Is requesting a flu vaccine.  She has been working hard on lifestyle changes.  As usual, is here today with her mother.   Past Medical/Surgical History: Past Medical History:  Diagnosis Date   BENIGN NEOPLASM OF SKIN SITE UNSPECIFIED 09/08/2008   CEREBROVASCULAR DISEASE 12/11/2008   DIABETES MELLITUS, TYPE II 10/12/2006   Endometriosis    Fibroids    HYPERLIPIDEMIA 10/12/2006   HYPERTENSION 10/12/2006   PERIPHERAL NEUROPATHY 10/21/2006   Stroke ([facility]     Past Surgical History:  Procedure Laterality Date   ABDOMINAL HYSTERECTOMY     LEFT HEART CATHETERIZATION WITH CORONARY ANGIOGRAM N/A 03/24/2013   Procedure: LEFT HEART CATHETERIZATION WITH CORONARY ANGIOGRAM;  Surgeon: TTroy Sine MD;  [facility];  Service: Cardiovascular;  Laterality: N/A;    Social History:  reports that she has never smoked. She has never used smokeless tobacco. She reports that she does not drink alcohol and does not use drugs.  Allergies: Allergies  Allergen Reactions   Tape Rash    Family History:  No history of heart disease, cancer, stroke that she is aware of   Current Outpatient Medications:    Accu-Chek Softclix Lancets lancets, Use 3 times daily to check glucose.  Dx E11.40 and Z79.4, Disp: 300 each, Rfl: 1   acetaminophen (TYLENOL) 650 MG CR tablet, Take 1,300 mg by mouth daily as needed for pain. ,  Disp: , Rfl:    aspirin EC 81 MG tablet, Take 81 mg by mouth daily., Disp: , Rfl:    atorvastatin (LIPITOR) 80 MG tablet, TAKE 1 TABLET(80 MG) BY MOUTH AT BEDTIME, Disp: 90 tablet, Rfl: 1   BD PEN NEEDLE NANO 2ND GEN 32G X 4 MM MISC, USE FOUR TIMES DAILY, Disp: 100 each, Rfl: 6   Blood Glucose Monitoring Suppl (ACCU-CHEK GUIDE ME) w/Device KIT, 1 each by Does not apply route daily. E11.9, Disp: 1 kit, Rfl: 0   carvedilol (COREG) 25 MG tablet, TAKE 2 TABLETS(50 MG) BY MOUTH TWICE DAILY, Disp: 360 tablet, Rfl: 1   Cholecalciferol (VITAMIN D-3) 5000 units TABS, Take 1 tablet by mouth daily., Disp: , Rfl:    clopidogrel (PLAVIX) 75 MG tablet, Take 1 tablet (75 mg total) by mouth daily., Disp: 90 tablet, Rfl: 3   Continuous Blood Gluc Receiver ([place] DEVI, 1 each by Does not apply route daily. E11.9, Disp: 1 each, Rfl: 1   Continuous Blood Gluc Sensor (DEXCOM G6 SENSOR) MISC, 1 each by Does not apply route daily. Dx e11.9, Disp: 3 each, Rfl: 3   Continuous Blood Gluc Transmit (DEXCOM G6 TRANSMITTER) MISC, 1 each by Does not apply route daily. E11.9, Disp: 1 each, Rfl: 3   diphenoxylate-atropine (LOMOTIL) 2.5-0.025 MG tablet, Take 1 tablet by mouth 4 (four) times daily as needed for diarrhea or loose stools., Disp: 30 tablet, Rfl: 2   Evolocumab (REPATHA SURECLICK) 1631MG/ML SOAJ, Inject 1 Dose into the  skin every 14 (fourteen) days., Disp: 2 mL, Rfl: 11   ezetimibe (ZETIA) 10 MG tablet, TAKE 1 TABLET(10 MG) BY MOUTH DAILY, Disp: 90 tablet, Rfl: 1   glucose blood (ACCU-CHEK GUIDE) test strip, 1 each by Other route 3 (three) times daily. Dx E11.40 and Z79.4, Disp: 300 each, Rfl: 1   insulin detemir (LEVEMIR FLEXTOUCH) 100 UNIT/ML FlexPen, Inject 48 Units into the skin at bedtime., Disp: 15 mL, Rfl: 3   losartan (COZAAR) 100 MG tablet, TAKE 1 TABLET(100 MG) BY MOUTH DAILY, Disp: 90 tablet, Rfl: 1   nitroGLYCERIN (NITROSTAT) 0.4 MG SL tablet, PLACE ONE TABLET UNDER THE TONGUE EVERY 5 MINUTES  FOR 3 DOSES AS NEEDED FOR CHEST PAIN, Disp: 25 tablet, Rfl: 2   potassium chloride SA (KLOR-CON M) 20 MEQ tablet, TAKE 1 TABLET BY MOUTH DAILY, Disp: 90 tablet, Rfl: 1   amLODipine (NORVASC) 10 MG tablet, TAKE 1 TABLET(10 MG) BY MOUTH DAILY, Disp: 90 tablet, Rfl: 1   chlorthalidone (HYGROTON) 50 MG tablet, TAKE 1 TABLET(50 MG) BY MOUTH DAILY, Disp: 90 tablet, Rfl: 1   diclofenac Sodium (VOLTAREN) 1 % GEL, APPLY 4 GRAMS TOPICALLY TO THE AFFECTED AREA FOUR TIMES DAILY, Disp: 100 g, Rfl: 1   insulin aspart (NOVOLOG FLEXPEN) 100 UNIT/ML FlexPen, Inject 33 Units into the skin 3 (three) times daily with meals. INJECT 30 UNITS PRIOR TO BRUNCH AND DINNER. IF BLOOD SUGAR IS GREATER THAN 150, ADD AN ADDITIONAL 4 UNITS, Disp: 3 mL, Rfl: 11   spironolactone (ALDACTONE) 25 MG tablet, TAKE 1 TABLET(25 MG) BY MOUTH DAILY, Disp: 90 tablet, Rfl: 1  Review of Systems:  Constitutional: Denies fever, chills, diaphoresis, appetite change. HEENT: Denies photophobia, eye pain, redness, hearing loss, ear pain, congestion, sore throat, rhinorrhea, sneezing, mouth sores, trouble swallowing, neck pain, neck stiffness and tinnitus.   Respiratory: Denies SOB, DOE, cough, chest tightness,  and wheezing.   Cardiovascular: Denies chest pain, palpitations and leg swelling.  Gastrointestinal: Denies nausea, vomiting, abdominal pain, diarrhea, constipation, blood in stool and abdominal distention.  Genitourinary: Denies dysuria, urgency, frequency, hematuria, flank pain and difficulty urinating.  Endocrine: Denies: hot or cold intolerance, sweats, changes in hair or nails, polyuria, polydipsia. Musculoskeletal: Denies myalgias, back pain, joint swelling, arthralgias and gait problem.  Skin: Denies pallor, rash and wound.  Neurological: Denies dizziness, seizures, syncope, light-headedness and headaches.  Hematological: Denies adenopathy. Easy bruising, personal or family bleeding history  Psychiatric/Behavioral: Denies suicidal  ideation, mood changes, confusion, nervousness, sleep disturbance and agitation    Physical Exam: Vitals:   12/17/21 1112 12/17/21 1139  BP: (!) 142/76 130/76  Pulse: 74   Temp: 98 F (36.7 C)   TempSrc: Oral   SpO2: 99%   Weight: 214 lb 14.4 oz (97.5 kg)     Body mass index is 36.89 kg/m.   Constitutional: NAD, calm, comfortable Eyes: PERRL, lids and conjunctivae normal, wears corrective lenses ENMT: Mucous membranes are moist.  Respiratory: clear to auscultation bilaterally, no wheezing, no crackles. Normal respiratory effort. No accessory muscle use.  Cardiovascular: Regular rate and rhythm, no murmurs / rubs / gallops. No extremity edema. Psychiatric: Normal judgment and insight. Alert and oriented x 3. Normal mood.    Impression and Plan:  Type 2 diabetes mellitus with diabetic neuropathy, with long-term current use of insulin ([place]) - Plan: POC HgB A1c  Influenza vaccine needed - Plan: Flu Vaccine QUAD 6+ mos PF IM (Fluarix Quad PF)  Stage 2 chronic kidney disease  Cerebrovascular disease  Essential hypertension  Dyslipidemia  -A1c shows improvement down to 8.9 from 10.7 in July.  She will continue lifestyle changes.  She has her first appointment with endocrinology scheduled for December. -Flu vaccine administered in office today. -Blood pressure is fairly well controlled. -LDL was at goal as of March 2023.  Time spent:30 minutes reviewing chart, interviewing and examining patient and formulating plan of care.     Lelon Frohlich, MD [facility]

## 2021-12-25 ENCOUNTER — Other Ambulatory Visit: Payer: Self-pay | Admitting: Internal Medicine

## 2021-12-25 DIAGNOSIS — Z794 Long term (current) use of insulin: Secondary | ICD-10-CM

## 2022-01-18 ENCOUNTER — Other Ambulatory Visit: Payer: Self-pay | Admitting: Internal Medicine

## 2022-01-18 DIAGNOSIS — E785 Hyperlipidemia, unspecified: Secondary | ICD-10-CM

## 2022-01-18 DIAGNOSIS — I10 Essential (primary) hypertension: Secondary | ICD-10-CM

## 2022-02-14 ENCOUNTER — Encounter: Payer: Self-pay | Admitting: Internal Medicine

## 2022-02-14 ENCOUNTER — Ambulatory Visit (INDEPENDENT_AMBULATORY_CARE_PROVIDER_SITE_OTHER): Payer: Medicare Other | Admitting: Internal Medicine

## 2022-02-14 VITALS — BP 122/70 | HR 81 | Ht 64.0 in | Wt 210.0 lb

## 2022-02-14 DIAGNOSIS — E1142 Type 2 diabetes mellitus with diabetic polyneuropathy: Secondary | ICD-10-CM

## 2022-02-14 DIAGNOSIS — E1122 Type 2 diabetes mellitus with diabetic chronic kidney disease: Secondary | ICD-10-CM | POA: Insufficient documentation

## 2022-02-14 DIAGNOSIS — E1165 Type 2 diabetes mellitus with hyperglycemia: Secondary | ICD-10-CM | POA: Diagnosis not present

## 2022-02-14 DIAGNOSIS — E113599 Type 2 diabetes mellitus with proliferative diabetic retinopathy without macular edema, unspecified eye: Secondary | ICD-10-CM

## 2022-02-14 DIAGNOSIS — Z794 Long term (current) use of insulin: Secondary | ICD-10-CM

## 2022-02-14 DIAGNOSIS — E119 Type 2 diabetes mellitus without complications: Secondary | ICD-10-CM | POA: Insufficient documentation

## 2022-02-14 DIAGNOSIS — E1140 Type 2 diabetes mellitus with diabetic neuropathy, unspecified: Secondary | ICD-10-CM | POA: Diagnosis not present

## 2022-02-14 DIAGNOSIS — E1159 Type 2 diabetes mellitus with other circulatory complications: Secondary | ICD-10-CM

## 2022-02-14 DIAGNOSIS — N1831 Chronic kidney disease, stage 3a: Secondary | ICD-10-CM | POA: Diagnosis not present

## 2022-02-14 LAB — POCT GLYCOSYLATED HEMOGLOBIN (HGB A1C): Hemoglobin A1C: 9.2 % — AB (ref 4.0–5.6)

## 2022-02-14 LAB — POCT GLUCOSE (DEVICE FOR HOME USE): Glucose Fasting, POC: 325 mg/dL — AB (ref 70–99)

## 2022-02-14 MED ORDER — BD PEN NEEDLE NANO 2ND GEN 32G X 4 MM MISC: 1.0000 | Freq: Four times a day (QID) | 3 refills | Status: AC

## 2022-02-14 MED ORDER — NOVOLOG FLEXPEN 100 UNIT/ML ~~LOC~~ SOPN: PEN_INJECTOR | SUBCUTANEOUS | 2 refills | Status: DC

## 2022-02-14 MED ORDER — TRULICITY 0.75 MG/0.5ML ~~LOC~~ SOAJ: 0.7500 mg | SUBCUTANEOUS | 3 refills | Status: AC

## 2022-02-14 MED ORDER — DEXCOM G7 SENSOR MISC: 1.0000 | 3 refills | Status: DC

## 2022-02-14 MED ORDER — LANTUS SOLOSTAR 100 UNIT/ML ~~LOC~~ SOPN: 50.0000 [IU] | PEN_INJECTOR | Freq: Every day | SUBCUTANEOUS | 4 refills | Status: DC

## 2022-02-14 NOTE — Patient Instructions (Addendum)
Switch Levemir to Lantus 50 units once daily  Continue Novolog 32 units Before each meal  Start Trulicity 5.91 mg weekly      HOW TO TREAT LOW BLOOD SUGARS (Blood sugar LESS THAN 70 MG/DL) Please follow the RULE OF 15 for the treatment of hypoglycemia treatment (when your (blood sugars are less than 70 mg/dL)   STEP 1: Take 15 grams of carbohydrates when your blood sugar is low, which includes:  3-4 GLUCOSE TABS  OR 3-4 OZ OF JUICE OR REGULAR SODA OR ONE TUBE OF GLUCOSE GEL    STEP 2: RECHECK blood sugar in 15 MINUTES STEP 3: If your blood sugar is still low at the 15 minute recheck --> then, go back to STEP 1 and treat AGAIN with another 15 grams of carbohydrates.

## 2022-02-14 NOTE — Progress Notes (Signed)
Name: Lynn Hobbs  MRN/ DOB: [id number], 08/22/1960   Age/ Sex: 61 y.o., female    PCP: Lynn Hobbs, Lynn Halsted, MD   Reason for Endocrinology Evaluation: Type 2 Diabetes Mellitus     Date of Initial Endocrinology Visit: 02/14/2022     PATIENT IDENTIFIER: Lynn Hobbs is a 61 y.o. female with a past medical history of DM, CAD, and HTN. The patient presented for initial endocrinology clinic visit on 02/14/2022 for consultative assistance with her diabetes management.    HPI: Lynn Hobbs is accompanied by her mother today    Diagnosed with DM in 2004 Prior Medications tried/Intolerance: Metformin- does not recall reason for discontinuation , Invokana - yeast infection   Currently checking blood sugars 0 x / day Hypoglycemia episodes : unknown               Hemoglobin A1c has ranged from 8.4% in 2020, peaking at 11.0% in 2015 .   In terms of diet, the patient eats 2 meals and a snack . She avoids sugar sweetened beverages    She was seen by Dr. Cruzita Lederer in 2014 and 2017.     HOME DIABETES REGIMEN: Levemir 42 units at night  NovoLog 32 units before each meal     Statin: Patient on statin and Repatha ACE-I/ARB: Yes   METER DOWNLOAD SUMMARY:    DIABETIC COMPLICATIONS: Microvascular complications:  DR (s/p laser surgery, neuropathy, CKD Last eye exam: Completed 2020  Macrovascular complications:  S/p STEMI 2015, CVA Denies:  PVD   PAST HISTORY: Past Medical History:  Past Medical History:  Diagnosis Date   BENIGN NEOPLASM OF SKIN SITE UNSPECIFIED 09/08/2008   CEREBROVASCULAR DISEASE 12/11/2008   DIABETES MELLITUS, TYPE II 10/12/2006   Endometriosis    Fibroids    HYPERLIPIDEMIA 10/12/2006   HYPERTENSION 10/12/2006   PERIPHERAL NEUROPATHY 10/21/2006   Stroke [facility])    Past Surgical History:  Past Surgical History:  Procedure Laterality Date   ABDOMINAL HYSTERECTOMY     LEFT HEART CATHETERIZATION WITH CORONARY ANGIOGRAM N/A 03/24/2013   Procedure: LEFT  HEART CATHETERIZATION WITH CORONARY ANGIOGRAM;  Surgeon: Troy Sine, MD;  Location: [facility] CATH LAB;  Service: Cardiovascular;  Laterality: N/A;    Social History:  reports that she has never smoked. She has never used smokeless tobacco. She reports that she does not drink alcohol and does not use drugs. Family History: No family history on file.   HOME MEDICATIONS: Allergies as of 02/14/2022       Reactions   Tape Rash        Medication List        Accurate as of February 14, 2022 10:31 AM. If you have any questions, ask your nurse or doctor.          Accu-Chek Guide Me w/Device Kit 1 each by Does not apply route daily. E11.9   Accu-Chek Guide test strip Generic drug: glucose blood 1 each by Other route 3 (three) times daily. Dx E11.40 and Z79.4   Accu-Chek Softclix Lancets lancets Use 3 times daily to check glucose.  Dx E11.40 and Z79.4   acetaminophen 650 MG CR tablet Commonly known as: TYLENOL Take 1,300 mg by mouth daily as needed for pain.   amLODipine 10 MG tablet Commonly known as: NORVASC TAKE 1 TABLET(10 MG) BY MOUTH DAILY   aspirin EC 81 MG tablet Take 81 mg by mouth daily.   atorvastatin 80 MG tablet Commonly known as: LIPITOR TAKE 1 TABLET(80 MG)  BY MOUTH AT BEDTIME   BD Pen Needle Nano 2nd Gen 32G X 4 MM Misc Generic drug: Insulin Pen Needle USE FOUR TIMES DAILY   carvedilol 25 MG tablet Commonly known as: COREG TAKE 2 TABLETS(50 MG) BY MOUTH TWICE DAILY   chlorthalidone 50 MG tablet Commonly known as: HYGROTON TAKE 1 TABLET(50 MG) BY MOUTH DAILY   clopidogrel 75 MG tablet Commonly known as: PLAVIX Take 1 tablet (75 mg total) by mouth daily.   Dexcom G6 Receiver Devi 1 each by Does not apply route daily. E11.9   Dexcom G6 Sensor Misc 1 each by Does not apply route daily. Dx e11.9   Dexcom G6 Transmitter Misc 1 each by Does not apply route daily. E11.9   diclofenac Sodium 1 % Gel Commonly known as: VOLTAREN APPLY 4 GRAMS  TOPICALLY TO THE AFFECTED AREA FOUR TIMES DAILY   diphenoxylate-atropine 2.5-0.025 MG tablet Commonly known as: LOMOTIL Take 1 tablet by mouth 4 (four) times daily as needed for diarrhea or loose stools.   ezetimibe 10 MG tablet Commonly known as: ZETIA TAKE 1 TABLET(10 MG) BY MOUTH DAILY   Levemir FlexTouch 100 UNIT/ML FlexPen Generic drug: insulin detemir Inject 48 Units into the skin at bedtime.   losartan 100 MG tablet Commonly known as: COZAAR TAKE 1 TABLET(100 MG) BY MOUTH DAILY   nitroGLYCERIN 0.4 MG SL tablet Commonly known as: Nitrostat PLACE ONE TABLET UNDER THE TONGUE EVERY 5 MINUTES FOR 3 DOSES AS NEEDED FOR CHEST PAIN   NovoLOG FlexPen 100 UNIT/ML FlexPen Generic drug: insulin aspart INJECT 30 UNITS INTO THE SKIN PRIOR TO BRUNCH AND DINNER. IF BLOOD SUGAR IS GREATER THAN 150, ADD AN ADDITIONAL 4 UNITS What changed: See the new instructions.   potassium chloride SA 20 MEQ tablet Commonly known as: KLOR-CON M TAKE 1 TABLET BY MOUTH DAILY   Repatha SureClick 096 MG/ML Soaj Generic drug: Evolocumab Inject 1 Dose into the skin every 14 (fourteen) days.   spironolactone 25 MG tablet Commonly known as: ALDACTONE TAKE 1 TABLET(25 MG) BY MOUTH DAILY   Vitamin D-3 125 MCG (5000 UT) Tabs Take 1 tablet by mouth daily.         ALLERGIES: Allergies  Allergen Reactions   Tape Rash     REVIEW OF SYSTEMS: A comprehensive ROS was conducted with the patient and is negative except as per HPI and below:  Review of Systems  Gastrointestinal:  Negative for diarrhea, nausea and vomiting.      OBJECTIVE:   VITAL SIGNS: BP 122/70 (BP Location: Left Arm, Patient Position: Sitting, Cuff Size: Small)   Pulse 81   Ht _0  (1.626 m)   Wt 210 lb (95.3 kg)   SpO2 99%   BMI 36.05 kg/m    PHYSICAL EXAM:  General: Pt appears well and is in NAD  Neck: General: Supple without adenopathy or carotid bruits. Thyroid: Thyroid size normal.  No goiter or nodules  appreciated.   Lungs: Clear with good BS bilat with no rales, rhonchi, or wheezes  Heart: RRR   Extremities:  Lower extremities - No pretibial edema. No lesions.  Neuro: MS is good with appropriate affect, pt is alert and Ox3    DATA REVIEWED:  Lab Results  Component Value Date   HGBA1C 8.9 (A) 12/17/2021   HGBA1C 10.7 (A) 09/16/2021   HGBA1C 10.1 (A) 06/06/2021   Lab Results  Component Value Date   MICROALBUR 17.4 (H) 08/18/2016   LDLCALC 33 05/27/2021   CREATININE 1.10 (H)  05/27/2021   Lab Results  Component Value Date   MICRALBCREAT 7.7 08/18/2016    Lab Results  Component Value Date   CHOL 111 05/27/2021   HDL 65 05/27/2021   LDLCALC 33 05/27/2021   LDLDIRECT 214.4 12/02/2012   TRIG 56 05/27/2021   CHOLHDL 1.7 05/27/2021       Old records , labs and images have been reviewed.   ASSESSMENT / PLAN / RECOMMENDATIONS:   1) Type 2 Diabetes Mellitus, poorly controlled, With retinopathy,CKD III , neuropathic and macrovascular complications - Most recent A1c of 9.2 %. Goal A1c < 7.0 %.     - I explained the complications associated with diabetes including retinopathy, nephropathy, neuropathy as well as increased risk of cardiovascular disease. We went over the benefit seen with glycemic control.   -I explained to the patient that diabetic patients are at higher than normal risk for amputations.  -We emphasized the importance of glucose checks at home, will also discussed the importance of having this data available to me -She has been prescribed Dexcom through her previous provider that she did not use, I have prescribed Dexcom G7 today and gave her a sample of a sensor to try at home -She has no history of pancreatitis, discussed GLP-1 agonist, cautioned against GI side effects -Intolerant to Invokana -She is to be on metformin, she does not recall the reason -I will switch Levemir to Lantus and increase the dose -No changes to NovoLog dose  MEDICATIONS: Switch  Levemir to Lantus 50 units daily Continue NovoLog 32 units 3 times daily before every meal Start Trulicity 1.75 mg weekly  EDUCATION / INSTRUCTIONS: BG monitoring instructions: Patient is instructed to check her blood sugars 3 times a day, before meals. Call [facility] if: BG persistently < 70  I reviewed the Rule of 15 for the treatment of hypoglycemia in detail with the patient. Literature supplied.   2) Diabetic complications:  Eye: Does  have known diabetic retinopathy.  Neuro/ Feet: Does  have known diabetic peripheral neuropathy. Renal: Patient does  have known baseline CKD. She is  on an ACEI/ARB at present.   F/U in 4 months     Signed electronically by: Mack Guise, MD  [facility] Endocrinology  [address] [place], [place] [address] Phone: [phone] FAX: [phone]   CC: Lynn Hobbs, Lynn Halsted, MD [place] [address] Phone: [phone]  Fax: [phone]    Return to Endocrinology clinic as below: Future Appointments  Date Time Provider [place]  03/20/2022 11:00 AM Lynn Hobbs, Lynn Halsted, MD [id number]

## 2022-02-17 MED ORDER — ACCU-CHEK GUIDE VI STRP: 1.0000 | ORAL_STRIP | Freq: Three times a day (TID) | 3 refills | Status: AC

## 2022-02-17 MED ORDER — ACCU-CHEK GUIDE ME W/DEVICE KIT: 1.0000 | PACK | Freq: Three times a day (TID) | 0 refills | Status: AC

## 2022-02-25 ENCOUNTER — Ambulatory Visit: Payer: Medicare Other | Admitting: Internal Medicine

## 2022-03-06 ENCOUNTER — Other Ambulatory Visit: Payer: Self-pay | Admitting: Cardiovascular Disease

## 2022-03-07 ENCOUNTER — Telehealth: Payer: Self-pay | Admitting: Pharmacist

## 2022-03-07 NOTE — Telephone Encounter (Signed)
Received Repatha PA request from pt's pharmacy. Tried to submit a new request, there is already one on file through 03/10/23. Don't know why pharmacy sent over this request. Called them and they stated med is too soon to fill.

## 2022-03-08 ENCOUNTER — Encounter (HOSPITAL_COMMUNITY): Payer: Self-pay | Admitting: Internal Medicine

## 2022-03-08 ENCOUNTER — Emergency Department (HOSPITAL_COMMUNITY): Payer: Medicare Other

## 2022-03-08 ENCOUNTER — Other Ambulatory Visit: Payer: Self-pay

## 2022-03-08 ENCOUNTER — Observation Stay (HOSPITAL_COMMUNITY)
Admission: EM | Admit: 2022-03-08 | Discharge: 2022-03-10 | Disposition: A | Discharge status: Home Health | Payer: Medicare Other | Attending: Internal Medicine | Admitting: Internal Medicine

## 2022-03-08 DIAGNOSIS — E1122 Type 2 diabetes mellitus with diabetic chronic kidney disease: Secondary | ICD-10-CM | POA: Insufficient documentation

## 2022-03-08 DIAGNOSIS — I5022 Chronic systolic (congestive) heart failure: Secondary | ICD-10-CM | POA: Insufficient documentation

## 2022-03-08 DIAGNOSIS — Z955 Presence of coronary angioplasty implant and graft: Secondary | ICD-10-CM | POA: Insufficient documentation

## 2022-03-08 DIAGNOSIS — Z794 Long term (current) use of insulin: Secondary | ICD-10-CM | POA: Insufficient documentation

## 2022-03-08 DIAGNOSIS — Z7982 Long term (current) use of aspirin: Secondary | ICD-10-CM | POA: Insufficient documentation

## 2022-03-08 DIAGNOSIS — E1165 Type 2 diabetes mellitus with hyperglycemia: Secondary | ICD-10-CM | POA: Diagnosis not present

## 2022-03-08 DIAGNOSIS — R059 Cough, unspecified: Secondary | ICD-10-CM | POA: Diagnosis not present

## 2022-03-08 DIAGNOSIS — E1151 Type 2 diabetes mellitus with diabetic peripheral angiopathy without gangrene: Secondary | ICD-10-CM | POA: Insufficient documentation

## 2022-03-08 DIAGNOSIS — Z7902 Long term (current) use of antithrombotics/antiplatelets: Secondary | ICD-10-CM | POA: Insufficient documentation

## 2022-03-08 DIAGNOSIS — R531 Weakness: Secondary | ICD-10-CM | POA: Diagnosis present

## 2022-03-08 DIAGNOSIS — Z8673 Personal history of transient ischemic attack (TIA), and cerebral infarction without residual deficits: Secondary | ICD-10-CM | POA: Diagnosis not present

## 2022-03-08 DIAGNOSIS — U071 COVID-19: Principal | ICD-10-CM | POA: Diagnosis present

## 2022-03-08 DIAGNOSIS — J00 Acute nasopharyngitis [common cold]: Secondary | ICD-10-CM | POA: Diagnosis not present

## 2022-03-08 DIAGNOSIS — I130 Hypertensive heart and chronic kidney disease with heart failure and stage 1 through stage 4 chronic kidney disease, or unspecified chronic kidney disease: Secondary | ICD-10-CM | POA: Diagnosis not present

## 2022-03-08 DIAGNOSIS — N179 Acute kidney failure, unspecified: Secondary | ICD-10-CM

## 2022-03-08 DIAGNOSIS — I10 Essential (primary) hypertension: Secondary | ICD-10-CM | POA: Diagnosis not present

## 2022-03-08 DIAGNOSIS — M6281 Muscle weakness (generalized): Secondary | ICD-10-CM | POA: Diagnosis not present

## 2022-03-08 DIAGNOSIS — R2681 Unsteadiness on feet: Secondary | ICD-10-CM | POA: Diagnosis not present

## 2022-03-08 DIAGNOSIS — Z7409 Other reduced mobility: Secondary | ICD-10-CM | POA: Insufficient documentation

## 2022-03-08 DIAGNOSIS — E1140 Type 2 diabetes mellitus with diabetic neuropathy, unspecified: Secondary | ICD-10-CM

## 2022-03-08 DIAGNOSIS — R29818 Other symptoms and signs involving the nervous system: Secondary | ICD-10-CM | POA: Diagnosis not present

## 2022-03-08 DIAGNOSIS — Z79899 Other long term (current) drug therapy: Secondary | ICD-10-CM | POA: Insufficient documentation

## 2022-03-08 DIAGNOSIS — R2689 Other abnormalities of gait and mobility: Secondary | ICD-10-CM | POA: Insufficient documentation

## 2022-03-08 DIAGNOSIS — N1831 Chronic kidney disease, stage 3a: Secondary | ICD-10-CM | POA: Diagnosis not present

## 2022-03-08 LAB — COMPREHENSIVE METABOLIC PANEL
ALT: 21 U/L (ref 0–44)
AST: 37 U/L (ref 15–41)
Albumin: 3.4 g/dL — ABNORMAL LOW (ref 3.5–5.0)
Alkaline Phosphatase: 39 U/L (ref 38–126)
Anion gap: 12 (ref 5–15)
BUN: 25 mg/dL — ABNORMAL HIGH (ref 8–23)
CO2: 22 mmol/L (ref 22–32)
Calcium: 9.4 mg/dL (ref 8.9–10.3)
Chloride: 102 mmol/L (ref 98–111)
Creatinine, Ser: 1.42 mg/dL — ABNORMAL HIGH (ref 0.44–1.00)
GFR, Estimated: 42 mL/min — ABNORMAL LOW (ref >60)
Glucose, Bld: 275 mg/dL — ABNORMAL HIGH (ref 70–99)
Potassium: 3.9 mmol/L (ref 3.5–5.1)
Sodium: 136 mmol/L (ref 135–145)
Total Bilirubin: 0.5 mg/dL (ref 0.3–1.2)
Total Protein: 7.3 g/dL (ref 6.5–8.1)

## 2022-03-08 LAB — RESP PANEL BY RT-PCR (RSV, FLU A&B, COVID)  RVPGX2
Influenza A by PCR: NEGATIVE
Influenza B by PCR: NEGATIVE
Resp Syncytial Virus by PCR: NEGATIVE
SARS Coronavirus 2 by RT PCR: POSITIVE — AB

## 2022-03-08 LAB — CBC WITH DIFFERENTIAL/PLATELET
Abs Immature Granulocytes: 0.02 10*3/uL (ref 0.00–0.07)
Basophils Absolute: 0 10*3/uL (ref 0.0–0.1)
Basophils Relative: 0 %
Eosinophils Absolute: 0 10*3/uL (ref 0.0–0.5)
Eosinophils Relative: 0 %
HCT: 32.2 % — ABNORMAL LOW (ref 36.0–46.0)
Hemoglobin: 10.5 g/dL — ABNORMAL LOW (ref 12.0–15.0)
Immature Granulocytes: 0 %
Lymphocytes Relative: 15 %
Lymphs Abs: 1.2 10*3/uL (ref 0.7–4.0)
MCH: 26.5 pg (ref 26.0–34.0)
MCHC: 32.6 g/dL (ref 30.0–36.0)
MCV: 81.3 fL (ref 80.0–100.0)
Monocytes Absolute: 1 10*3/uL (ref 0.1–1.0)
Monocytes Relative: 13 %
Neutro Abs: 6 10*3/uL (ref 1.7–7.7)
Neutrophils Relative %: 72 %
Platelets: 151 10*3/uL (ref 150–400)
RBC: 3.96 MIL/uL (ref 3.87–5.11)
RDW: 15.5 % (ref 11.5–15.5)
WBC: 8.3 10*3/uL (ref 4.0–10.5)
nRBC: 0 % (ref 0.0–0.2)

## 2022-03-08 LAB — CBG MONITORING, ED
Glucose-Capillary: 254 mg/dL — ABNORMAL HIGH (ref 70–99)
Glucose-Capillary: 257 mg/dL — ABNORMAL HIGH (ref 70–99)

## 2022-03-08 LAB — URINALYSIS, ROUTINE W REFLEX MICROSCOPIC
Bilirubin Urine: NEGATIVE
Glucose, UA: NEGATIVE mg/dL
Hgb urine dipstick: NEGATIVE
Ketones, ur: NEGATIVE mg/dL
Nitrite: NEGATIVE
Protein, ur: 30 mg/dL — AB
Specific Gravity, Urine: 1.021 (ref 1.005–1.030)
pH: 5 (ref 5.0–8.0)

## 2022-03-08 MED ORDER — HYDRALAZINE HCL 20 MG/ML IJ SOLN: 10.0000 mg | Freq: Four times a day (QID) | INTRAMUSCULAR | Status: DC | PRN

## 2022-03-08 MED ORDER — ADULT MULTIVITAMIN W/MINERALS CH
1.0000 | ORAL_TABLET | Freq: Every day | ORAL | Status: DC
  Administered 2022-03-08 – 2022-03-10 (×3): 1 via ORAL
  Filled 2022-03-08 (×3): qty 1

## 2022-03-08 MED ORDER — FOLIC ACID 1 MG PO TABS
1.0000 mg | ORAL_TABLET | Freq: Every day | ORAL | Status: DC
  Administered 2022-03-08 – 2022-03-10 (×3): 1 mg via ORAL
  Filled 2022-03-08 (×3): qty 1

## 2022-03-08 MED ORDER — ACETAMINOPHEN 650 MG RE SUPP: 650.0000 mg | Freq: Four times a day (QID) | RECTAL | Status: DC | PRN

## 2022-03-08 MED ORDER — THIAMINE MONONITRATE 100 MG PO TABS
100.0000 mg | ORAL_TABLET | Freq: Every day | ORAL | Status: DC
  Administered 2022-03-08 – 2022-03-10 (×3): 100 mg via ORAL
  Filled 2022-03-08 (×3): qty 1

## 2022-03-08 MED ORDER — NIRMATRELVIR/RITONAVIR (PAXLOVID)TABLET: 3.0000 | ORAL_TABLET | Freq: Two times a day (BID) | ORAL | Status: DC

## 2022-03-08 MED ORDER — SODIUM CHLORIDE 0.9 % IV SOLN: INTRAVENOUS | Status: DC

## 2022-03-08 MED ORDER — ENOXAPARIN SODIUM 40 MG/0.4ML IJ SOSY
40.0000 mg | PREFILLED_SYRINGE | INTRAMUSCULAR | Status: DC
  Administered 2022-03-08 – 2022-03-09 (×2): 40 mg via SUBCUTANEOUS
  Filled 2022-03-08 (×2): qty 0.4

## 2022-03-08 MED ORDER — SODIUM CHLORIDE 0.9 % IV BOLUS
1000.0000 mL | Freq: Once | INTRAVENOUS | Status: AC
  Administered 2022-03-08: 1000 mL via INTRAVENOUS

## 2022-03-08 MED ORDER — NIRMATRELVIR/RITONAVIR (PAXLOVID) TABLET (RENAL DOSING)
2.0000 | ORAL_TABLET | Freq: Two times a day (BID) | ORAL | Status: DC
  Administered 2022-03-09 – 2022-03-10 (×4): 2 via ORAL
  Filled 2022-03-08: qty 20

## 2022-03-08 MED ORDER — ASPIRIN 81 MG PO TBEC
81.0000 mg | DELAYED_RELEASE_TABLET | Freq: Every day | ORAL | Status: DC
  Administered 2022-03-08 – 2022-03-10 (×3): 81 mg via ORAL
  Filled 2022-03-08 (×3): qty 1

## 2022-03-08 MED ORDER — POLYETHYLENE GLYCOL 3350 17 G PO PACK: 17.0000 g | PACK | Freq: Every day | ORAL | Status: DC | PRN

## 2022-03-08 MED ORDER — GUAIFENESIN-DM 100-10 MG/5ML PO SYRP: 10.0000 mL | ORAL_SOLUTION | ORAL | Status: DC | PRN

## 2022-03-08 MED ORDER — SODIUM CHLORIDE 0.9 % IV BOLUS: 500.0000 mL | Freq: Once | INTRAVENOUS | Status: DC

## 2022-03-08 MED ORDER — INSULIN ASPART 100 UNIT/ML IJ SOLN
0.0000 [IU] | Freq: Every day | INTRAMUSCULAR | Status: DC
  Administered 2022-03-08 – 2022-03-09 (×2): 3 [IU] via SUBCUTANEOUS

## 2022-03-08 MED ORDER — INSULIN ASPART 100 UNIT/ML IJ SOLN
0.0000 [IU] | Freq: Three times a day (TID) | INTRAMUSCULAR | Status: DC
  Administered 2022-03-09: 5 [IU] via SUBCUTANEOUS
  Administered 2022-03-09: 2 [IU] via SUBCUTANEOUS
  Administered 2022-03-09: 1 [IU] via SUBCUTANEOUS
  Administered 2022-03-10: 5 [IU] via SUBCUTANEOUS
  Administered 2022-03-10: 1 [IU] via SUBCUTANEOUS

## 2022-03-08 MED ORDER — CLOPIDOGREL BISULFATE 75 MG PO TABS
75.0000 mg | ORAL_TABLET | Freq: Every day | ORAL | Status: DC
  Administered 2022-03-08 – 2022-03-10 (×3): 75 mg via ORAL
  Filled 2022-03-08 (×3): qty 1

## 2022-03-08 MED ORDER — SENNA 8.6 MG PO TABS
1.0000 | ORAL_TABLET | Freq: Two times a day (BID) | ORAL | Status: DC
  Administered 2022-03-08 – 2022-03-10 (×4): 8.6 mg via ORAL
  Filled 2022-03-08 (×4): qty 1

## 2022-03-08 MED ORDER — ACETAMINOPHEN 325 MG PO TABS: 650.0000 mg | ORAL_TABLET | Freq: Four times a day (QID) | ORAL | Status: DC | PRN

## 2022-03-08 MED ORDER — HYDROCOD POLI-CHLORPHE POLI ER 10-8 MG/5ML PO SUER: 5.0000 mL | Freq: Two times a day (BID) | ORAL | Status: DC | PRN

## 2022-03-08 MED ORDER — IPRATROPIUM-ALBUTEROL 20-100 MCG/ACT IN AERS
1.0000 | INHALATION_SPRAY | Freq: Four times a day (QID) | RESPIRATORY_TRACT | Status: DC
  Administered 2022-03-09: 1 via RESPIRATORY_TRACT
  Filled 2022-03-08: qty 4

## 2022-03-08 MED ORDER — INSULIN GLARGINE-YFGN 100 UNIT/ML ~~LOC~~ SOLN
30.0000 [IU] | Freq: Every day | SUBCUTANEOUS | Status: DC
  Administered 2022-03-09: 30 [IU] via SUBCUTANEOUS
  Filled 2022-03-08 (×2): qty 0.3

## 2022-03-08 MED ORDER — TRAZODONE HCL 50 MG PO TABS: 25.0000 mg | ORAL_TABLET | Freq: Every evening | ORAL | Status: DC | PRN

## 2022-03-08 MED ORDER — ALBUTEROL SULFATE (2.5 MG/3ML) 0.083% IN NEBU
2.5000 mg | INHALATION_SOLUTION | Freq: Four times a day (QID) | RESPIRATORY_TRACT | Status: DC
  Administered 2022-03-08: 2.5 mg via RESPIRATORY_TRACT
  Filled 2022-03-08: qty 3

## 2022-03-08 MED ORDER — CARVEDILOL 12.5 MG PO TABS
12.5000 mg | ORAL_TABLET | Freq: Two times a day (BID) | ORAL | Status: DC
  Administered 2022-03-09 – 2022-03-10 (×3): 12.5 mg via ORAL
  Filled 2022-03-08 (×3): qty 1

## 2022-03-08 NOTE — H&P (Signed)
[facility] History and Physical  SOSIE GATO YKZ:[id number] DOB: 03/31/1960 DOA: 03/08/2022 PCP: Isaac Bliss, Rayford Halsted, MD  Admitted from: Home Chief Complaint: Cough, shortness of breath  History of Present Illness: Lynn Hobbs is a 61 y.o. female with PMH significant for DM2, HTN, HLD, CAD/stent, stroke with residual left-sided weakness, peripheral neuropathy, fibroid, endometriosis who lives at home with her mother.  Her mother had cough, shortness of breath which patient caught few days ago she became more sick than her mother.  She had a fall on 12/28, did not hit head.  She has been having gradual worsening lethargy and hence presented to the ED.  In the ED, patient had temperature 100.2, heart rate 80s, blood pressure 103/57, breathing on room air Labs with sodium 136, potassium 3.9, BUN/creatinine 25/1.42, WBC count 8.3, hemoglobin 10.5 Glucose level elevated to 275 Respiratory virus panel positive for COVID Chest x-ray unremarkable Urinalysis showed hazy yellow urine with trace leukocytes, many bacteria CT did not show acute abnormality, showed chronic sequelae following remote right cerebellar infarct.  With the degree of weakness, patient did not feel comfortable going home Hospitalist service consulted for observation overnight  At the time of my evaluation, patient was lying down on bed.  Felt weak.  Not in respiratory distress.  Coughs on deep breathing. No family at bedside.  No nausea, vomiting, diarrhea  Review of Systems:  All systems were reviewed and were negative unless otherwise mentioned in the HPI   Past medical history: Past Medical History:  Diagnosis Date   BENIGN NEOPLASM OF SKIN SITE UNSPECIFIED 09/08/2008   CEREBROVASCULAR DISEASE 12/11/2008   DIABETES MELLITUS, TYPE II 10/12/2006   Endometriosis    Fibroids    HYPERLIPIDEMIA 10/12/2006   HYPERTENSION 10/12/2006   PERIPHERAL NEUROPATHY 10/21/2006   Stroke [facility])     Past surgical  history: Past Surgical History:  Procedure Laterality Date   ABDOMINAL HYSTERECTOMY     LEFT HEART CATHETERIZATION WITH CORONARY ANGIOGRAM N/A 03/24/2013   Procedure: LEFT HEART CATHETERIZATION WITH CORONARY ANGIOGRAM;  Surgeon: Troy Sine, MD;  Location: [facility] CATH LAB;  Service: Cardiovascular;  Laterality: N/A;    Social History:  reports that she has never smoked. She has never used smokeless tobacco. She reports that she does not drink alcohol and does not use drugs.  Allergies:  Allergies  Allergen Reactions   Tape Rash   Tape   Family history:  No family history on file.  Family history reviewed, unremarkable  Home Meds: Prior to Admission medications   Medication Sig Start Date End Date Taking? Authorizing Provider  Accu-Chek Softclix Lancets lancets Use 3 times daily to check glucose.  Dx E11.40 and Z79.4 07/21/19   Isaac Bliss, Rayford Halsted, MD  acetaminophen (TYLENOL) 650 MG CR tablet Take 1,300 mg by mouth daily as needed for pain.     [provider]  amLODipine (NORVASC) 10 MG tablet TAKE 1 TABLET(10 MG) BY MOUTH DAILY 12/17/21   Isaac Bliss, Rayford Halsted, MD  aspirin EC 81 MG tablet Take 81 mg by mouth daily.    [provider]  atorvastatin (LIPITOR) 80 MG tablet TAKE 1 TABLET(80 MG) BY MOUTH AT BEDTIME 01/20/22   Isaac Bliss, Rayford Halsted, MD  Blood Glucose Monitoring Suppl (ACCU-CHEK GUIDE ME) w/Device KIT 1 Device by Does not apply route 3 (three) times daily. 02/17/22   Shamleffer, Melanie Crazier, MD  carvedilol (COREG) 25 MG tablet TAKE 2 TABLETS(50 MG) BY MOUTH TWICE DAILY 01/20/22  Isaac Bliss, Rayford Halsted, MD  chlorthalidone (HYGROTON) 50 MG tablet TAKE 1 TABLET(50 MG) BY MOUTH DAILY 12/17/21   Isaac Bliss, Rayford Halsted, MD  Cholecalciferol (VITAMIN D-3) 5000 units TABS Take 1 tablet by mouth daily.    [provider]  clopidogrel (PLAVIX) 75 MG tablet Take 1 tablet (75 mg total) by mouth daily. 03/28/21   Troy Sine, MD   Continuous Blood Gluc Sensor (DEXCOM G7 SENSOR) MISC 1 Device by Does not apply route as directed. 02/14/22   Shamleffer, Melanie Crazier, MD  diclofenac Sodium (VOLTAREN) 1 % GEL APPLY 4 GRAMS TOPICALLY TO THE AFFECTED AREA FOUR TIMES DAILY 12/17/21   Isaac Bliss, Rayford Halsted, MD  diphenoxylate-atropine (LOMOTIL) 2.5-0.025 MG tablet Take 1 tablet by mouth 4 (four) times daily as needed for diarrhea or loose stools. 07/18/20   Isaac Bliss, Rayford Halsted, MD  Dulaglutide (TRULICITY) 0.93 OI/7.1IW SOPN Inject 0.75 mg into the skin once a week. 02/14/22   Shamleffer, Melanie Crazier, MD  ezetimibe (ZETIA) 10 MG tablet TAKE 1 TABLET(10 MG) BY MOUTH DAILY 07/29/21   Isaac Bliss, Rayford Halsted, MD  glucose blood (ACCU-CHEK GUIDE) test strip 1 each by Other route 3 (three) times daily. Dx E11.40 and Z79.4 07/18/20   Isaac Bliss, Rayford Halsted, MD  glucose blood (ACCU-CHEK GUIDE) test strip 1 each by Other route 3 (three) times daily. Use as instructed 02/17/22   Shamleffer, Melanie Crazier, MD  insulin aspart (NOVOLOG FLEXPEN) 100 UNIT/ML FlexPen Max daily 70 units daily 02/14/22   Shamleffer, Melanie Crazier, MD  insulin glargine (LANTUS SOLOSTAR) 100 UNIT/ML Solostar Pen Inject 50 Units into the skin daily. 02/14/22   Shamleffer, Melanie Crazier, MD  Insulin Pen Needle (BD PEN NEEDLE NANO 2ND GEN) 32G X 4 MM MISC 1 Device by Other route 4 (four) times daily. 02/14/22   Shamleffer, Melanie Crazier, MD  losartan (COZAAR) 100 MG tablet TAKE 1 TABLET(100 MG) BY MOUTH DAILY 09/16/21   Isaac Bliss, Rayford Halsted, MD  nitroGLYCERIN (NITROSTAT) 0.4 MG SL tablet PLACE ONE TABLET UNDER THE TONGUE EVERY 5 MINUTES FOR 3 DOSES AS NEEDED FOR CHEST PAIN 03/21/21   Troy Sine, MD  potassium chloride SA (KLOR-CON M) 20 MEQ tablet TAKE 1 TABLET BY MOUTH DAILY 05/03/21   Troy Sine, MD  REPATHA SURECLICK 580 MG/ML SOAJ INJECT 1 PEN(140 MG) INTO THE SKIN EVERY 14 DAYS 03/06/22   Troy Sine, MD  spironolactone  (ALDACTONE) 25 MG tablet TAKE 1 TABLET(25 MG) BY MOUTH DAILY 12/17/21   Isaac Bliss, Rayford Halsted, MD    Physical Exam: Vitals:   03/08/22 1015 03/08/22 1045 03/08/22 1345 03/08/22 1411  BP: (!) 103/57 (!) 97/58  109/65  Pulse: 86 81 80 82  Resp: (!) _0 Temp:    98.9 F (37.2 C)  TempSrc:      SpO2: 96% 98% 97% 100%  Weight:      Height:       Wt Readings from Last 3 Encounters:  03/08/22 97.1 kg  02/14/22 95.3 kg  12/17/21 97.5 kg   Body mass index is 36.73 kg/m.  General exam: Pleasant, middle-aged African-American female.  Feels weak, not in pain Skin: No rashes, lesions or ulcers. HEENT: Atraumatic, normocephalic, no obvious bleeding Lungs: Diminished air entry in both bases.  Coughs on deep breathing CVS: Regular rate and rhythm, no murmur GI/Abd soft, nontender, nondistended, bowel sound present CNS: Alert, awake, oriented x 3 Psychiatry: Mood appropriate Extremities: No pedal  edema, no calf redness     Consult Orders  (From admission, onward)           Start     Ordered   03/08/22 1726  PT eval and treat  Routine        03/08/22 1725   03/08/22 1526  Consult to hospitalist  Once       Provider:  (Not yet assigned)  Question Answer Comment  Place call to: [facility]   Reason for Consult Admit      03/08/22 1525            Labs on Admission:   CBC: Recent Labs  Lab 03/08/22 1440  WBC 8.3  NEUTROABS 6.0  HGB 10.5*  HCT 32.2*  MCV 81.3  PLT 144    Basic Metabolic Panel: Recent Labs  Lab 03/08/22 1013  NA 136  K 3.9  CL 102  CO2 22  GLUCOSE 275*  BUN 25*  CREATININE 1.42*  CALCIUM 9.4    Liver Function Tests: Recent Labs  Lab 03/08/22 1013  AST 37  ALT 21  ALKPHOS 39  BILITOT 0.5  PROT 7.3  ALBUMIN 3.4*   No results for input(s): "LIPASE", "AMYLASE" in the last 168 hours. No results for input(s): "AMMONIA" in the last 168 hours.  Cardiac Enzymes: No results for input(s): "CKTOTAL", "CKMB",  "CKMBINDEX", "TROPONINI" in the last 168 hours.  BNP (last 3 results) No results for input(s): "BNP" in the last 8760 hours.  ProBNP (last 3 results) No results for input(s): "PROBNP" in the last 8760 hours.  CBG: Recent Labs  Lab 03/08/22 1016  GLUCAP 254*    Lipase  No results found for: "LIPASE"   Urinalysis    Component Value Date/Time   COLORURINE YELLOW 03/08/2022 1013   APPEARANCEUR HAZY (A) 03/08/2022 1013   LABSPEC 1.021 03/08/2022 1013   PHURINE 5.0 03/08/2022 1013   GLUCOSEU NEGATIVE 03/08/2022 1013   HGBUR NEGATIVE 03/08/2022 1013   HGBUR trace-lysed 12/04/2008 0906   BILIRUBINUR NEGATIVE 03/08/2022 1013   KETONESUR NEGATIVE 03/08/2022 1013   PROTEINUR 30 (A) 03/08/2022 1013   UROBILINOGEN 0.2 12/04/2008 0906   NITRITE NEGATIVE 03/08/2022 1013   LEUKOCYTESUR TRACE (A) 03/08/2022 1013     Drugs of Abuse  No results found for: "LABOPIA", "COCAINSCRNUR", "LABBENZ", "AMPHETMU", "THCU", "LABBARB"    Radiological Exams on Admission: CT HEAD WO CONTRAST (5MM)  Result Date: 03/08/2022 CLINICAL DATA:  Neuro deficit, acute, stroke EXAM: CT HEAD WITHOUT CONTRAST TECHNIQUE: Contiguous axial images were obtained from the base of the skull through the vertex without intravenous contrast. RADIATION DOSE REDUCTION: This exam was performed according to the departmental dose-optimization program which includes automated exposure control, adjustment of the mA and/or kV according to patient size and/or use of iterative reconstruction technique. COMPARISON:  11/13/2016 FINDINGS: Brain: Low-density changes in the right cerebellar hemisphere are new since 2018. No evidence of intracranial hemorrhage, hydrocephalus, extra-axial collection or mass lesion/mass effect. Areas of encephalomalacia in the right frontotemporal and left cerebellar regions are unchanged, compatible with remote infarcts. Scattered low-density changes within the periventricular and subcortical white matter  compatible with chronic microvascular ischemic change. Mild diffuse cerebral volume loss. Vascular: Atherosclerotic calcifications involving the large vessels of the skull base. No unexpected hyperdense vessel. Skull: Normal. Negative for fracture or focal lesion. Sinuses/Orbits: No acute finding. Other: None. IMPRESSION: 1. Low-density changes in the right cerebellar hemisphere are new since 2018 but favored to represent sequela of chronic infarct. Further evaluation with  MRI can be performed to better assess. 2. Otherwise, no acute intracranial findings. 3. Chronic microvascular ischemic change and cerebral volume loss. 4. Remote right frontotemporal and left cerebellar infarcts. Electronically Signed   By: Davina Poke D.O.   On: 03/08/2022 11:41   DG Chest 2 View  Result Date: 03/08/2022 CLINICAL DATA:  Cough EXAM: CHEST - 2 VIEW COMPARISON:  03/24/2013 FINDINGS: The heart size and mediastinal contours are within normal limits. Low lung volumes. No focal airspace consolidation, pleural effusion, or pneumothorax. Degenerative thoracic spondylosis. IMPRESSION: No active cardiopulmonary disease. Electronically Signed   By: Davina Poke D.O.   On: 03/08/2022 10:47     ------------------------------------------------------------------------------------------------------ Assessment/Plan: Principal Problem:   COVID-19 virus infection  COVID infection Presented with cough, shortness of breath, URI symptoms COVID test: COVID PCR positive in the ED Chest imaging: Chest x-ray unremarkable Treatment: Will start on a 5-day course of Paxlovid.  Not hypoxic to require steroids Check ambulatory oxygen requirement Continue supportive care with antitussives, PRN inhalers, Tylenol. Encouraged incentive spirometry Recent Labs  Lab 03/08/22 1013 03/08/22 1440  SARSCOV2NAA POSITIVE*  --   WBC  --  8.3  ALT 21  --    Uncontrolled type 2 diabetes mellitus with hyperglycemia A1c 9.2 on  02/14/22 Blood sugar level elevated over 200. PTA on Trulicity 1.74 mg weekly, Lantus 50 units daily, Premeal insulin 30 units 3 times daily Resume Lantus at 30 units tonight, start sliding scale insulin with accuchecks.  Needs further assessment. Recent Labs  Lab 03/08/22 1016  GLUCAP 254*   AKI on CKD 3a Baseline creatinine 1.1 from March 2023.  Presented with creatinine elevated to 1.42. Start on normal saline at 75 mill per hour.  Continue to monitor Recent Labs    05/27/21 0951 03/08/22 1013  BUN 24 25*  CREATININE 1.10* 1.42*   Chronic mild systolic CHF HTN PTA on Coreg 25 mg twice daily, chlorthalidone 50 mg daily, amlodipine 10 mg daily, losartan 100 mg daily, Aldactone 25 mg daily Home meds hour has not been reconciled.  For tonight, resume Coreg 12.5 mg twice daily currently. Keep others on hold.  Continue to monitor blood pressure.  CAD/stent On aspirin and statin  HLD PTA on Repatha, Lipitor, Zetia  Stroke with residual left-sided weakness CT head showed showed chronic sequelae following remote right cerebellar infarct.  Impaired mobility Generalized weakness PT eval ordered   Mobility: Encourage ambulation  Goals of care   Code Status: Full Code   Diet: Diet Order             Diet Heart Room service appropriate? Yes; Fluid consistency: Thin  Diet effective now                   Nutritional status:  Body mass index is 36.73 kg/m.       DVT prophylaxis: Lovenox subcu    Antimicrobials: Paxlovid Fluid: NS at 75 mill per hour Consultants: None Family Communication: None at bedside Dispo: The patient is from: Home              Anticipated d/c is to: Home hopefully 1 to 2 days        ------------------------------------------------------------------------------------- Severity of Illness: The appropriate patient status for this patient is OBSERVATION. Observation status is judged to be reasonable and necessary in order to provide the  required intensity of service to ensure the patient's safety. The patient's presenting symptoms, physical exam findings, and initial radiographic and laboratory data in the context of  their medical condition is felt to place them at decreased risk for further clinical deterioration. Furthermore, it is anticipated that the patient will be medically stable for discharge from the hospital within 2 midnights of admission.   -------------------------------------------------------------------------------------  Signed, Terrilee Croak, MD [facility] 03/08/2022

## 2022-03-08 NOTE — ED Notes (Signed)
Requested ordered medication from pharmacy 

## 2022-03-08 NOTE — ED Notes (Addendum)
Patient transported to CT, NAD, A&O at this time.

## 2022-03-08 NOTE — ED Provider Notes (Cosign Needed)
Patient care taken over at shift handoff from [facility], PA-C   Lynn Hobbs is a 61 y.o. female history includes CVA on Plavix, diabetes, hypertension, hyperlipidemia, MI.   Patient presents to the ER for 3 days of flulike illness she reports that she first noticed rhinorrhea, sneezing and sore throat 3 days ago she reports she felt generally weak at that time.  She does report a history of CVA with chronic left-sided weakness without change.  She denies any new unilateral weakness.  She also reports that she slid off of her bed 2 days ago onto her buttock she denies any pain from that event she denies any head injury or loss of consciousness.   Patient reports that her mother is also experiencing similar symptoms over the past few days.   Patient denies any pain today she denies dizziness, headache/vision change, chest pain/shortness of breath, hemoptysis, cough, abdominal pain, vomiting, diarrhea or any additional concerns. Physical Exam  BP 109/65   Pulse 82   Temp 98.9 F (37.2 C)   Resp 18   Ht '5\' 4"'$  (1.626 m)   Wt 97.1 kg   SpO2 100%   BMI 36.73 kg/m   Physical Exam  Procedures  Procedures  ED Course / MDM   Clinical Course as of 03/08/22 1701  Sat Mar 08, 2022  1051 DG Chest 2 View I have personally reviewed and interpreted patient's two-view chest x-ray.  I do not appreciate any obvious PTX, PNA, effusion or other acute cardiopulmonary process. [BM]  64 CT HEAD WO CONTRAST (5MM) I have personally reviewed and interpreted patient CT head.  I do not appreciate any obvious intracranial hemorrhage or shift.  Patient with prior CVA, awaiting radiologist interpretation [BM]  1122 Comprehensive metabolic panel(!) CMP shows mild AKI with creatinine 1.42 and BUN 25.  Slightly elevated from prior, creatinine 1.19 months ago.  No emergent electrolyte derangements.  LFTs unremarkable.  No.  Bicarb within normal limits.  Doubt DKA. [BM]  1229 Admit once cbc returns [BM]   1445 EKG 12-Lead I have personally reviewed and interpreted twelve-lead EKG.  I do not appreciate any obvious acute ischemic changes. [BM]  1446 Resp panel by RT-PCR (RSV, Flu A&B, Covid) Anterior Nasal Swab(!) COVID test positive suspect this to be etiology of patient's symptoms [BM]  1527 CBC with Differential/Platelet(!) CBC shows mild anemia of 10.5.  No leukocytosis or thrombocytopenia. [BM]  1527 Urinalysis, Routine w reflex microscopic Urine, Clean Catch(!) Urinalysis shows trace leukocytes, 11-20 WBCs and many bacteria.  Nitrate negative.  Possible UTI however no urinary symptoms [BM]    Clinical Course User Index [BM] [facility], PA-C   Medical Decision Making Amount and/or Complexity of Data Reviewed Labs: ordered. Decision-making details documented in ED Course. Radiology: ordered. Decision-making details documented in ED Course. ECG/medicine tests:  Decision-making details documented in ED Course.  Risk Decision regarding hospitalization.   I discussed admission with Dr. Pietro Cassis of the hospitalist service. He agrees to see and admit the patient for increased weakness with a Covid 19 diagnosis.        Lynn Peng, PA-C 03/08/22 1705

## 2022-03-08 NOTE — ED Triage Notes (Signed)
Pt BIB EMS due to weakness Pt fell in Thursday, denies LOC, did not hit head. Pt has hx of stroke a\nd 2 stents and is on blood thinners Pt states mom lives with pt and is having cold/cough sx as well Pt had left sided weakness from past stroke. Axox4

## 2022-03-08 NOTE — ED Provider Notes (Signed)
[facility] Provider Note   CSN: [id number] Arrival date & time: 03/08/22  1005     History  Chief Complaint  Patient presents with   Weakness    Lynn Hobbs is a 61 y.o. female history includes CVA on Plavix, diabetes, hypertension, hyperlipidemia, MI.  Patient presents to the ER for 3 days of flulike illness she reports that she first noticed rhinorrhea, sneezing and sore throat 3 days ago she reports she felt generally weak at that time.  She does report a history of CVA with chronic left-sided weakness without change.  She denies any new unilateral weakness.  She also reports that she slid off of her bed 2 days ago onto her buttock she denies any pain from that event she denies any head injury or loss of consciousness.  Patient reports that her mother is also experiencing similar symptoms over the past few days.  Patient denies any pain today she denies dizziness, headache/vision change, chest pain/shortness of breath, hemoptysis, cough, abdominal pain, vomiting, diarrhea or any additional concerns.  HPI     Home Medications Prior to Admission medications   Medication Sig Start Date End Date Taking? Authorizing Provider  Accu-Chek Softclix Lancets lancets Use 3 times daily to check glucose.  Dx E11.40 and Z79.4 07/21/19   Isaac Bliss, Rayford Halsted, MD  acetaminophen (TYLENOL) 650 MG CR tablet Take 1,300 mg by mouth daily as needed for pain.     [provider]  amLODipine (NORVASC) 10 MG tablet TAKE 1 TABLET(10 MG) BY MOUTH DAILY 12/17/21   Isaac Bliss, Rayford Halsted, MD  aspirin EC 81 MG tablet Take 81 mg by mouth daily.    [provider]  atorvastatin (LIPITOR) 80 MG tablet TAKE 1 TABLET(80 MG) BY MOUTH AT BEDTIME 01/20/22   Isaac Bliss, Rayford Halsted, MD  Blood Glucose Monitoring Suppl (ACCU-CHEK GUIDE ME) w/Device KIT 1 Device by Does not apply route 3 (three) times daily. 02/17/22   Shamleffer, Melanie Crazier,  MD  carvedilol (COREG) 25 MG tablet TAKE 2 TABLETS(50 MG) BY MOUTH TWICE DAILY 01/20/22   Isaac Bliss, Rayford Halsted, MD  chlorthalidone (HYGROTON) 50 MG tablet TAKE 1 TABLET(50 MG) BY MOUTH DAILY 12/17/21   Isaac Bliss, Rayford Halsted, MD  Cholecalciferol (VITAMIN D-3) 5000 units TABS Take 1 tablet by mouth daily.    [provider]  clopidogrel (PLAVIX) 75 MG tablet Take 1 tablet (75 mg total) by mouth daily. 03/28/21   Troy Sine, MD  Continuous Blood Gluc Sensor (DEXCOM G7 SENSOR) MISC 1 Device by Does not apply route as directed. 02/14/22   Shamleffer, Melanie Crazier, MD  diclofenac Sodium (VOLTAREN) 1 % GEL APPLY 4 GRAMS TOPICALLY TO THE AFFECTED AREA FOUR TIMES DAILY 12/17/21   Isaac Bliss, Rayford Halsted, MD  diphenoxylate-atropine (LOMOTIL) 2.5-0.025 MG tablet Take 1 tablet by mouth 4 (four) times daily as needed for diarrhea or loose stools. 07/18/20   Isaac Bliss, Rayford Halsted, MD  Dulaglutide (TRULICITY) 8.34 HD/6.2IW SOPN Inject 0.75 mg into the skin once a week. 02/14/22   Shamleffer, Melanie Crazier, MD  ezetimibe (ZETIA) 10 MG tablet TAKE 1 TABLET(10 MG) BY MOUTH DAILY 07/29/21   Isaac Bliss, Rayford Halsted, MD  glucose blood (ACCU-CHEK GUIDE) test strip 1 each by Other route 3 (three) times daily. Dx E11.40 and Z79.4 07/18/20   Isaac Bliss, Rayford Halsted, MD  glucose blood (ACCU-CHEK GUIDE) test strip 1 each by Other route 3 (three) times daily. Use as  instructed 02/17/22   Shamleffer, Melanie Crazier, MD  insulin aspart (NOVOLOG FLEXPEN) 100 UNIT/ML FlexPen Max daily 70 units daily 02/14/22   Shamleffer, Melanie Crazier, MD  insulin glargine (LANTUS SOLOSTAR) 100 UNIT/ML Solostar Pen Inject 50 Units into the skin daily. 02/14/22   Shamleffer, Melanie Crazier, MD  Insulin Pen Needle (BD PEN NEEDLE NANO 2ND GEN) 32G X 4 MM MISC 1 Device by Other route 4 (four) times daily. 02/14/22   Shamleffer, Melanie Crazier, MD  losartan (COZAAR) 100 MG tablet TAKE 1 TABLET(100 MG) BY MOUTH  DAILY 09/16/21   Isaac Bliss, Rayford Halsted, MD  nitroGLYCERIN (NITROSTAT) 0.4 MG SL tablet PLACE ONE TABLET UNDER THE TONGUE EVERY 5 MINUTES FOR 3 DOSES AS NEEDED FOR CHEST PAIN 03/21/21   Troy Sine, MD  potassium chloride SA (KLOR-CON M) 20 MEQ tablet TAKE 1 TABLET BY MOUTH DAILY 05/03/21   Troy Sine, MD  REPATHA SURECLICK 094 MG/ML SOAJ INJECT 1 PEN(140 MG) INTO THE SKIN EVERY 14 DAYS 03/06/22   Troy Sine, MD  spironolactone (ALDACTONE) 25 MG tablet TAKE 1 TABLET(25 MG) BY MOUTH DAILY 12/17/21   Isaac Bliss, Rayford Halsted, MD      Allergies    Tape    Review of Systems   Review of Systems Ten systems are reviewed and are negative for acute change except as noted in the HPI  Physical Exam Updated Vital Signs BP 109/65   Pulse 82   Temp 98.9 F (37.2 C)   Resp 18   Ht _0  (1.626 m)   Wt 97.1 kg   SpO2 100%   BMI 36.73 kg/m  Physical Exam Constitutional:      General: She is not in acute distress.    Appearance: Normal appearance. She is well-developed. She is not ill-appearing or diaphoretic.  HENT:     Head: Normocephalic and atraumatic.  Eyes:     General: Vision grossly intact. Gaze aligned appropriately.     Pupils: Pupils are equal, round, and reactive to light.  Neck:     Trachea: Trachea and phonation normal.  Cardiovascular:     Rate and Rhythm: Normal rate and regular rhythm.  Pulmonary:     Effort: Pulmonary effort is normal. No respiratory distress.     Breath sounds: Normal breath sounds.  Abdominal:     General: There is no distension.     Palpations: Abdomen is soft.     Tenderness: There is no abdominal tenderness. There is no guarding or rebound.  Musculoskeletal:        General: Normal range of motion.     Cervical back: Normal range of motion.     Comments: Patient is able to pull to sit.  She has no midline spinal tenderness palpation.  No crepitus cough or deformity of the spine.  No pain with palpation of the hips.  Pelvis to  compression without pain.  Skin:    General: Skin is warm and dry.  Neurological:     Mental Status: She is alert.     GCS: GCS eye subscore is 4. GCS verbal subscore is 5. GCS motor subscore is 6.     Comments: Speech is clear and goal oriented, follows commands Major Cranial nerves without deficit, no facial droop Moderate left-sided weakness noted patient reports his baseline  Psychiatric:        Behavior: Behavior normal.     ED Results / Procedures / Treatments   Labs (all labs ordered are  listed, but only abnormal results are displayed) Labs Reviewed  RESP PANEL BY RT-PCR (RSV, FLU A&B, COVID)  RVPGX2 - Abnormal; Notable for the following components:      Result Value   SARS Coronavirus 2 by RT PCR POSITIVE (*)    All other components within normal limits  COMPREHENSIVE METABOLIC PANEL - Abnormal; Notable for the following components:   Glucose, Bld 275 (*)    BUN 25 (*)    Creatinine, Ser 1.42 (*)    Albumin 3.4 (*)    GFR, Estimated 42 (*)    All other components within normal limits  URINALYSIS, ROUTINE W REFLEX MICROSCOPIC - Abnormal; Notable for the following components:   APPearance HAZY (*)    Protein, ur 30 (*)    Leukocytes,Ua TRACE (*)    Bacteria, UA MANY (*)    All other components within normal limits  CBC WITH DIFFERENTIAL/PLATELET - Abnormal; Notable for the following components:   Hemoglobin 10.5 (*)    HCT 32.2 (*)    All other components within normal limits  CBG MONITORING, ED - Abnormal; Notable for the following components:   Glucose-Capillary 254 (*)    All other components within normal limits  CBC WITH DIFFERENTIAL/PLATELET    EKG EKG Interpretation  Date/Time:  Saturday March 08 2022 12:10:14 EST Ventricular Rate:  90 PR Interval:  160 QRS Duration: 109 QT Interval:  379 QTC Calculation: 464 R Axis:   37 Text Interpretation: Sinus rhythm Anterior infarct, old Confirmed by Dene Gentry [phone]) on 03/08/2022 12:47:19  PM  Radiology CT HEAD WO CONTRAST (5MM)  Result Date: 03/08/2022 CLINICAL DATA:  Neuro deficit, acute, stroke EXAM: CT HEAD WITHOUT CONTRAST TECHNIQUE: Contiguous axial images were obtained from the base of the skull through the vertex without intravenous contrast. RADIATION DOSE REDUCTION: This exam was performed according to the departmental dose-optimization program which includes automated exposure control, adjustment of the mA and/or kV according to patient size and/or use of iterative reconstruction technique. COMPARISON:  11/13/2016 FINDINGS: Brain: Low-density changes in the right cerebellar hemisphere are new since 2018. No evidence of intracranial hemorrhage, hydrocephalus, extra-axial collection or mass lesion/mass effect. Areas of encephalomalacia in the right frontotemporal and left cerebellar regions are unchanged, compatible with remote infarcts. Scattered low-density changes within the periventricular and subcortical white matter compatible with chronic microvascular ischemic change. Mild diffuse cerebral volume loss. Vascular: Atherosclerotic calcifications involving the large vessels of the skull base. No unexpected hyperdense vessel. Skull: Normal. Negative for fracture or focal lesion. Sinuses/Orbits: No acute finding. Other: None. IMPRESSION: 1. Low-density changes in the right cerebellar hemisphere are new since 2018 but favored to represent sequela of chronic infarct. Further evaluation with MRI can be performed to better assess. 2. Otherwise, no acute intracranial findings. 3. Chronic microvascular ischemic change and cerebral volume loss. 4. Remote right frontotemporal and left cerebellar infarcts. Electronically Signed   By: Davina Poke D.O.   On: 03/08/2022 11:41   DG Chest 2 View  Result Date: 03/08/2022 CLINICAL DATA:  Cough EXAM: CHEST - 2 VIEW COMPARISON:  03/24/2013 FINDINGS: The heart size and mediastinal contours are within normal limits. Low lung volumes. No focal  airspace consolidation, pleural effusion, or pneumothorax. Degenerative thoracic spondylosis. IMPRESSION: No active cardiopulmonary disease. Electronically Signed   By: Davina Poke D.O.   On: 03/08/2022 10:47    Procedures Procedures    Medications Ordered in ED Medications  sodium chloride 0.9 % bolus 1,000 mL (0 mLs Intravenous Stopped 03/08/22 1353)  ED Course/ Medical Decision Making/ A&P Clinical Course as of 03/08/22 1530  Sat Mar 08, 2022  1051 DG Chest 2 View I have personally reviewed and interpreted patient's two-view chest x-ray.  I do not appreciate any obvious PTX, PNA, effusion or other acute cardiopulmonary process. [BM]  22 CT HEAD WO CONTRAST (5MM) I have personally reviewed and interpreted patient CT head.  I do not appreciate any obvious intracranial hemorrhage or shift.  Patient with prior CVA, awaiting radiologist interpretation [BM]  1122 Comprehensive metabolic panel(!) CMP shows mild AKI with creatinine 1.42 and BUN 25.  Slightly elevated from prior, creatinine 1.19 months ago.  No emergent electrolyte derangements.  LFTs unremarkable.  No.  Bicarb within normal limits.  Doubt DKA. [BM]  1229 Admit once cbc returns [BM]  1445 EKG 12-Lead I have personally reviewed and interpreted twelve-lead EKG.  I do not appreciate any obvious acute ischemic changes. [BM]  1446 Resp panel by RT-PCR (RSV, Flu A&B, Covid) Anterior Nasal Swab(!) COVID test positive suspect this to be etiology of patient's symptoms [BM]  1527 CBC with Differential/Platelet(!) CBC shows mild anemia of 10.5.  No leukocytosis or thrombocytopenia. [BM]  1527 Urinalysis, Routine w reflex microscopic Urine, Clean Catch(!) Urinalysis shows trace leukocytes, 11-20 WBCs and many bacteria.  Nitrate negative.  Possible UTI however no urinary symptoms [BM]    Clinical Course User Index [BM] Gari Crown                           Medical Decision Making 61 year old female with medical  history as above presented for generalized weakness along with rhinorrhea, sore throat and sneezing x 3 days.  She denies any unilateral weakness but does have some left-sided deficits from prior CVA that she reports is unchanged.  Finally she does report sliding off the couch a few days ago onto her buttocks but she denies any head injury loss consciousness or injury from that event.  On exam she is in no acute distress.  She denies any associated chest pain or shortness of breath.  No nausea or vomiting.  No abdominal pain.  Differential for generalized weakness and rhinorrhea, sneezing sore throat includes but not limited to viral illness, DKA, PNA, UTI, sepsis, intracranial hemorrhage.  Low suspicion for CVA at this point.  I have ordered CBC, CMP, CBG, urinalysis, chest x-ray, CT head. Case discussed w/ Dr. Francia Greaves who agrees with plan.  Amount and/or Complexity of Data Reviewed Labs: ordered. Decision-making details documented in ED Course. Radiology: ordered. Decision-making details documented in ED Course. ECG/medicine tests: ordered. Decision-making details documented in ED Course.  Risk Decision regarding hospitalization. Risk Details: Given patient's several comorbid conditions and new AKI along with decreased p.o. intake feel patient will need admission to the hospital for further management.  Case discussed with Dr. Francia Greaves who agrees with care plan.  Patient was reassessed she is resting comfortably in bed.  Patient's mother is at bedside.      Note: Portions of this report may have been transcribed using voice recognition software. Every effort was made to ensure accuracy; however, inadvertent computerized transcription errors may still be present.         Final Clinical Impression(s) / ED Diagnoses Final diagnoses:  COVID  AKI (acute kidney injury) [facility])    Rx / DC Orders ED Discharge Orders     None         Deliah Boston, PA-C 03/08/22 1548  Valarie Merino, MD 03/08/22 [phone]

## 2022-03-09 DIAGNOSIS — U071 COVID-19: Secondary | ICD-10-CM | POA: Diagnosis not present

## 2022-03-09 LAB — BASIC METABOLIC PANEL
Anion gap: 7 (ref 5–15)
BUN: 27 mg/dL — ABNORMAL HIGH (ref 8–23)
CO2: 24 mmol/L (ref 22–32)
Calcium: 9.2 mg/dL (ref 8.9–10.3)
Chloride: 104 mmol/L (ref 98–111)
Creatinine, Ser: 1.28 mg/dL — ABNORMAL HIGH (ref 0.44–1.00)
GFR, Estimated: 48 mL/min — ABNORMAL LOW (ref >60)
Glucose, Bld: 171 mg/dL — ABNORMAL HIGH (ref 70–99)
Potassium: 4.4 mmol/L (ref 3.5–5.1)
Sodium: 135 mmol/L (ref 135–145)

## 2022-03-09 LAB — GLUCOSE, CAPILLARY
Glucose-Capillary: 143 mg/dL — ABNORMAL HIGH (ref 70–99)
Glucose-Capillary: 172 mg/dL — ABNORMAL HIGH (ref 70–99)
Glucose-Capillary: 182 mg/dL — ABNORMAL HIGH (ref 70–99)
Glucose-Capillary: 252 mg/dL — ABNORMAL HIGH (ref 70–99)
Glucose-Capillary: 261 mg/dL — ABNORMAL HIGH (ref 70–99)

## 2022-03-09 LAB — CBC
HCT: 31.7 % — ABNORMAL LOW (ref 36.0–46.0)
Hemoglobin: 10.5 g/dL — ABNORMAL LOW (ref 12.0–15.0)
MCH: 26.6 pg (ref 26.0–34.0)
MCHC: 33.1 g/dL (ref 30.0–36.0)
MCV: 80.3 fL (ref 80.0–100.0)
Platelets: 150 10*3/uL (ref 150–400)
RBC: 3.95 MIL/uL (ref 3.87–5.11)
RDW: 15.4 % (ref 11.5–15.5)
WBC: 8.8 10*3/uL (ref 4.0–10.5)
nRBC: 0 % (ref 0.0–0.2)

## 2022-03-09 LAB — HIV ANTIBODY (ROUTINE TESTING W REFLEX): HIV Screen 4th Generation wRfx: NONREACTIVE

## 2022-03-09 MED ORDER — INSULIN GLARGINE-YFGN 100 UNIT/ML ~~LOC~~ SOLN
40.0000 [IU] | Freq: Every day | SUBCUTANEOUS | Status: DC
  Administered 2022-03-09: 40 [IU] via SUBCUTANEOUS
  Filled 2022-03-09 (×2): qty 0.4

## 2022-03-09 MED ORDER — IPRATROPIUM-ALBUTEROL 20-100 MCG/ACT IN AERS
1.0000 | INHALATION_SPRAY | Freq: Three times a day (TID) | RESPIRATORY_TRACT | Status: DC
  Administered 2022-03-09 – 2022-03-10 (×3): 1 via RESPIRATORY_TRACT
  Filled 2022-03-09: qty 4

## 2022-03-09 MED ORDER — ALBUTEROL SULFATE (2.5 MG/3ML) 0.083% IN NEBU
2.5000 mg | INHALATION_SOLUTION | Freq: Four times a day (QID) | RESPIRATORY_TRACT | Status: DC | PRN
  Filled 2022-03-09: qty 3

## 2022-03-09 NOTE — Evaluation (Signed)
Physical Therapy Evaluation Patient Details Name: YAMIRA PAPA MRN: [id number] DOB: 02/18/1961 Today's Date: 03/09/2022  History of Present Illness  Kadi FATIM VANDERSCHAAF is a 61 y.o. female admitted with cough, SOB, Covid +; with PMH significant for DM2, HTN, HLD, CAD/stent, stroke with residual left-sided weakness, peripheral neuropathy, fibroid, endometriosis  Clinical Impression   Pt admitted with above diagnosis. Lives at home with her mother (has covid as well), in a single-level home; Prior to admission, pt was able to walk modified independently in her home (uses a cane when outside the home); Presents to PT with generalized weakness, fatigue from covid infection, which accentuates impairments foom previous CVA;  Today, pt needed min physical assist to stand and walk short distance in room;   Pt currently with functional limitations due to the deficits listed below (see PT Problem List). Pt will benefit from skilled PT to increase their independence and safety with mobility to allow discharge to the venue listed below.       Verdean indicated she is worried abouthe possibility of dc'ing tomorrow because she needs to be at or very near independent -- her 81yo mother has covid as well and she doesn't want to over tax her mother by needing more assist, which is quite reasonable.     Recommendations for follow up therapy are one component of a multi-disciplinary discharge planning process, led by the attending physician.  Recommendations may be updated based on patient status, additional functional criteria and insurance authorization.  Follow Up Recommendations Home health PT      Assistance Recommended at Discharge Intermittent Supervision/Assistance  Patient can return home with the following  A little help with walking and/or transfers;A lot of help with bathing/dressing/bathroom    Equipment Recommendations BSC/3in1;Rolling [place] (2 wheels)  Recommendations for Other Services  OT  consult    Functional Status Assessment Patient has had a recent decline in their functional status and demonstrates the ability to make significant improvements in function in a reasonable and predictable amount of time.     Precautions / Restrictions Precautions Precautions: Fall Precaution Comments: Covid Restrictions Weight Bearing Restrictions: No      Mobility  Bed Mobility Overal bed mobility: Needs Assistance Bed Mobility: Supine to Sit     Supine to sit: Supervision     General bed mobility comments: Incr time, HOB elevated    Transfers Overall transfer level: Needs assistance Equipment used: Rolling Abdelaziz (2 wheels) Transfers: Sit to/from Stand Sit to Stand: Mod assist           General transfer comment: Mod assist to shift anteriorly and rise    Ambulation/Gait Ambulation/Gait assistance: Min assist Gait Distance (Feet): 12 Feet Assistive device: Rolling [place] (2 wheels) Gait Pattern/deviations: Decreased step length - right, Decreased step length - left       General Gait Details: Occasional need for assist for LUE on RW; slow, short steps; VERY tired post in-room amb  Stairs            Wheelchair Mobility    Modified Rankin (Stroke Patients Only)       Balance Overall balance assessment: Needs assistance   Sitting balance-Leahy Scale: Poor       Standing balance-Leahy Scale: Poor                               Pertinent Vitals/Pain Pain Assessment Pain Assessment: No/denies pain    Home Living Family/patient expects  to be discharged to:: Private residence Living Arrangements: Parent (16 yo mother, who is also covid +) Available Help at Discharge: Family;Other (Comment) (pt and mother help each other) Type of Home: House Home Access: Ramped entrance (can't quite rmember, need to verify)       Home Layout: One level Home Equipment: [place] - single point Additional Comments: Will need more info re: home seup     Prior Function               Mobility Comments: Walks without assistive device inher home; uses a cane when out of the home       Hand Dominance   Dominant Hand: Right    Extremity/Trunk Assessment   Upper Extremity Assessment Upper Extremity Assessment: Defer to OT evaluation (L hemiplegia)    Lower Extremity Assessment Lower Extremity Assessment: Generalized weakness;LLE deficits/detail LLE Deficits / Details: Residual weakness form CVA       Communication   Communication: No difficulties  Cognition Arousal/Alertness: Awake/alert Behavior During Therapy: WFL for tasks assessed/performed Overall Cognitive Status: Within Functional Limits for tasks assessed                                          General Comments General comments (skin integrity, edema, etc.): Session conducted on Room Air with no overt dyspnea noted    Exercises     Assessment/Plan    PT Assessment Patient needs continued PT services  PT Problem List Decreased strength;Decreased range of motion;Decreased activity tolerance;Decreased balance;Decreased mobility;Decreased coordination;Decreased knowledge of use of DME;Decreased safety awareness;Decreased knowledge of precautions;Cardiopulmonary status limiting activity       PT Treatment Interventions DME instruction;Gait training;Stair training;Functional mobility training;Therapeutic activities;Therapeutic exercise;Balance training;Patient/family education;Neuromuscular re-education    PT Goals (Current goals can be found in the Care Plan section)  Acute Rehab PT Goals Patient Stated Goal: Be able to get up and walk safely enough that her mother, 42 yo and with covid as well, doesn't have to help her with mobility and ADLs PT Goal Formulation: With patient Time For Goal Achievement: 03/23/22 Potential to Achieve Goals: Good    Frequency Min 3X/week     Co-evaluation               AM-PAC PT "6 Clicks"  Mobility  Outcome Measure Help needed turning from your back to your side while in a flat bed without using bedrails?: A Little Help needed moving from lying on your back to sitting on the side of a flat bed without using bedrails?: A Little Help needed moving to and from a bed to a chair (including a wheelchair)?: A Lot Help needed standing up from a chair using your arms (e.g., wheelchair or bedside chair)?: A Little Help needed to walk in hospital room?: A Little Help needed climbing 3-5 steps with a railing? : A Lot 6 Click Score: 16    End of Session Equipment Utilized During Treatment: Gait belt Activity Tolerance: Patient tolerated treatment well Patient left: in bed;with call bell/phone within reach (bed in semi-chair position) Nurse Communication: Mobility status (need for pure wick) PT Visit Diagnosis: Unsteadiness on feet (R26.81);Hemiplegia and hemiparesis Hemiplegia - Right/Left: Left Hemiplegia - dominant/non-dominant: Non-dominant Hemiplegia - caused by: Cerebral infarction    Time: 1523-1600 PT Time Calculation (min) (ACUTE ONLY): 37 min   Charges:   PT Evaluation $PT Eval Moderate Complexity: 1 Mod PT Treatments $  Gait Training: 8-22 mins        Roney Marion, PT  Acute Rehabilitation Services Office [phone]   [place]ta [place] 03/09/2022, 5:45 PM

## 2022-03-09 NOTE — Care Management Obs Status (Signed)
[facility] NOTIFICATION   Patient Details  Name: PATTIJO JUSTE MRN: [id number] Date of Birth: 06/09/1960   Medicare Observation Status Notification Given:  Yes    Bartholomew Crews, RN 03/09/2022, 5:06 PM

## 2022-03-09 NOTE — Progress Notes (Signed)
PROGRESS NOTE  Lynn Hobbs  DOB: 11/23/1960  PCP: Isaac Bliss, Rayford Halsted, MD ZOX:[id number]  DOA: 03/08/2022  LOS: 0 days  Hospital Day: 2  Brief narrative: Lynn Hobbs is a 61 y.o. female with PMH significant for DM2, HTN, HLD, CAD/stent, stroke with residual left-sided weakness, peripheral neuropathy, fibroid, endometriosis who lives at home with her mother.  Her mother had cough, shortness of breath which patient caught few days ago she became more sick than her mother.  She had a fall on 12/28, did not hit head.  She has been having gradual worsening lethargy and hence presented to the ED.  In the ED, patient had temperature 100.2, heart rate 80s, blood pressure 103/57, breathing on room air Labs with sodium 136, potassium 3.9, BUN/creatinine 25/1.42, WBC count 8.3, hemoglobin 10.5 Glucose level elevated to 275 Respiratory virus panel positive for COVID Chest x-ray unremarkable Urinalysis showed hazy yellow urine with trace leukocytes, many bacteria CT did not show acute abnormality, showed chronic sequelae following remote right cerebellar infarct.  With the degree of weakness, patient did not feel comfortable going home Admitted to [facility]  Subjective: Patient was seen and examined this morning.  Pleasant middle-aged African-American female.  Lying down in bed.  Not in distress.  Still feels weak.  Coughs on deep breathing.  Has not been out of bed since admission.  Pending PT evaluation. Tmax 101.1 last night  Assessment/Plan: COVID infection Presented with cough, shortness of breath, URI symptoms COVID test: COVID PCR positive in the ED Chest imaging: Chest x-ray unremarkable Treatment: Currently on a 5-day course of Paxlovid.  Not hypoxic to require steroids Check ambulatory oxygen requirement Continue supportive care with antitussives, PRN inhalers, Tylenol. Encouraged incentive spirometry Have intermittent spikes of fever.  Continue to monitor Recent Labs   Lab 03/08/22 1013 03/08/22 1440 03/09/22 0326  SARSCOV2NAA POSITIVE*  --   --   WBC  --  8.3 8.8  ALT 21  --   --    Uncontrolled type 2 diabetes mellitus with hyperglycemia A1c 9.2 on 02/14/22 Blood sugar level elevated over 200. PTA on Trulicity 8.11 mg weekly, Lantus 50 units daily, Premeal insulin 30 units 3 times daily Currently on Semglee 30 units nightly along with sliding scale insulin with accuchecks.  Blood sugar level running elevated.  Increase Semglee to 40 units from tonight. Recent Labs  Lab 03/08/22 1016 03/08/22 2206 03/09/22 0049 03/09/22 0837 03/09/22 1253  GLUCAP 254* 257* 182* 143* 261*   AKI on CKD 3a Baseline creatinine 1.1 from March 2023.  Presented with creatinine elevated to 1.42.  Currently on IV hydration with NS at 75 mill per hour.  Stop IV hydration today. Recent Labs    05/27/21 0951 03/08/22 1013 03/09/22 0326  BUN 24 25* 27*  CREATININE 1.10* 1.42* 1.28*   Chronic mild systolic CHF HTN PTA on Coreg 25 mg twice daily, chlorthalidone 50 mg daily, amlodipine 10 mg daily, losartan 100 mg daily, Aldactone 25 mg daily Currently on Coreg 12.5 mg twice daily only.  Others on hold.  Heart rate and blood pressure stable.  CAD/stent On aspirin and statin  HLD PTA on Repatha, Lipitor, Zetia  Stroke with residual left-sided weakness CT head showed showed chronic sequelae following remote right cerebellar infarct.  Impaired mobility Generalized weakness PT eval ordered  Mobility: Encourage ambulation  Goals of care   Code Status: Full Code   Diet: Diet Order  Diet Heart Room service appropriate? Yes; Fluid consistency: Thin  Diet effective now                  Nutritional status:  Body mass index is 36.73 kg/m.       DVT prophylaxis: Lovenox subcu enoxaparin (LOVENOX) injection 40 mg Start: 03/08/22 2030   Antimicrobials: Paxlovid Fluid: NS at 57 mill per hour Consultants: None Family Communication: None  at bedside  Status is: Observation  Continue in-hospital care because: Feels weak.  Spiking fever.  Pending PT eval Level of care: Telemetry Medical   Dispo: The patient is from: Home              Anticipated d/c is to: Pending clinical course              Patient currently is not medically stable to d/c.   Difficult to place patient No  Infusions:     Scheduled Meds:  aspirin EC  81 mg Oral Daily   carvedilol  12.5 mg Oral BID WC   clopidogrel  75 mg Oral Daily   enoxaparin (LOVENOX) injection  40 mg Subcutaneous R51O   folic acid  1 mg Oral Daily   insulin aspart  0-5 Units Subcutaneous QHS   insulin aspart  0-9 Units Subcutaneous TID WC   insulin glargine-yfgn  40 Units Subcutaneous QHS   Ipratropium-Albuterol  1 puff Inhalation TID   multivitamin with minerals  1 tablet Oral Daily   nirmatrelvir/ritonavir (renal dosing)  2 tablet Oral BID   senna  1 tablet Oral BID   thiamine  100 mg Oral Daily    PRN meds: acetaminophen **OR** acetaminophen, albuterol, chlorpheniramine-HYDROcodone, guaiFENesin-dextromethorphan, hydrALAZINE, polyethylene glycol, traZODone   Antimicrobials: Anti-infectives (From admission, onward)    Start     Dose/Rate Route Frequency Ordered Stop   03/08/22 2230  nirmatrelvir/ritonavir (renal dosing) (PAXLOVID) 2 tablet        2 tablet Oral 2 times daily 03/08/22 2220 03/13/22 2159   03/08/22 2200  nirmatrelvir/ritonavir (PAXLOVID) 3 tablet  Status:  Discontinued        3 tablet Oral 2 times daily 03/08/22 1707 03/08/22 2218       Objective: Vitals:   03/09/22 0617 03/09/22 0835  BP: 122/60 129/64  Pulse: 80 80  Resp: 18 14  Temp: 98.7 F (37.1 C) 98.6 F (37 C)  SpO2: 99% 100%    Intake/Output Summary (Last 24 hours) at 03/09/2022 1412 Last data filed at 03/09/2022 0400 Gross per 24 hour  Intake 529.83 ml  Output --  Net 529.83 ml   Filed Weights   03/08/22 1010  Weight: 97.1 kg   Weight change:  Body mass index is 36.73  kg/m.   Physical Exam: General exam: Pleasant, middle-aged African-American female.  Feels weak. Skin: No rashes, lesions or ulcers. HEENT: Atraumatic, normocephalic, no obvious bleeding Lungs: Diminished air entry in both bases.  Coughs on deep breathing CVS: Regular rate and rhythm, no murmur GI/Abd soft, nontender, nondistended, bowel sound present CNS: Alert, awake, oriented x 3.  Slow to respond.  Baseline left-sided deficits from previous stroke Psychiatry: Mood appropriate Extremities: No pedal edema, no calf redness  Data Review: I have personally reviewed the laboratory data and studies available.  F/u labs ordered Unresulted Labs (From admission, onward)     Start     Ordered   03/10/22 8416  Basic metabolic panel  Tomorrow morning,   R        03/09/22  1412   03/08/22 1013  CBC with Differential  Once,   STAT        03/08/22 1012           Signed, Terrilee Croak, MD [facility] 03/09/2022

## 2022-03-09 NOTE — Progress Notes (Signed)
SATURATION QUALIFICATIONS: (This note is used to comply with regulatory documentation for home oxygen)  Patient Saturations on Room Air at Rest = 99%  Patient Saturations on Room Air while Ambulating = 96%  Patient Saturations on 0 Liters of oxygen while Ambulating = 96%  Please briefly explain why patient needs home oxygen: No need

## 2022-03-10 DIAGNOSIS — M6281 Muscle weakness (generalized): Secondary | ICD-10-CM | POA: Diagnosis not present

## 2022-03-10 DIAGNOSIS — Z7982 Long term (current) use of aspirin: Secondary | ICD-10-CM | POA: Diagnosis not present

## 2022-03-10 DIAGNOSIS — Z794 Long term (current) use of insulin: Secondary | ICD-10-CM | POA: Diagnosis not present

## 2022-03-10 DIAGNOSIS — N1831 Chronic kidney disease, stage 3a: Secondary | ICD-10-CM | POA: Diagnosis not present

## 2022-03-10 DIAGNOSIS — Z8673 Personal history of transient ischemic attack (TIA), and cerebral infarction without residual deficits: Secondary | ICD-10-CM | POA: Diagnosis not present

## 2022-03-10 DIAGNOSIS — Z79899 Other long term (current) drug therapy: Secondary | ICD-10-CM | POA: Diagnosis not present

## 2022-03-10 DIAGNOSIS — I5022 Chronic systolic (congestive) heart failure: Secondary | ICD-10-CM | POA: Diagnosis not present

## 2022-03-10 DIAGNOSIS — Z7409 Other reduced mobility: Secondary | ICD-10-CM | POA: Diagnosis not present

## 2022-03-10 DIAGNOSIS — Z7902 Long term (current) use of antithrombotics/antiplatelets: Secondary | ICD-10-CM | POA: Diagnosis not present

## 2022-03-10 DIAGNOSIS — U071 COVID-19: Secondary | ICD-10-CM | POA: Diagnosis not present

## 2022-03-10 DIAGNOSIS — I130 Hypertensive heart and chronic kidney disease with heart failure and stage 1 through stage 4 chronic kidney disease, or unspecified chronic kidney disease: Secondary | ICD-10-CM | POA: Diagnosis not present

## 2022-03-10 DIAGNOSIS — R531 Weakness: Secondary | ICD-10-CM | POA: Diagnosis present

## 2022-03-10 DIAGNOSIS — R2681 Unsteadiness on feet: Secondary | ICD-10-CM | POA: Diagnosis not present

## 2022-03-10 DIAGNOSIS — N179 Acute kidney failure, unspecified: Secondary | ICD-10-CM | POA: Diagnosis not present

## 2022-03-10 DIAGNOSIS — Z955 Presence of coronary angioplasty implant and graft: Secondary | ICD-10-CM | POA: Diagnosis not present

## 2022-03-10 DIAGNOSIS — R2689 Other abnormalities of gait and mobility: Secondary | ICD-10-CM | POA: Diagnosis not present

## 2022-03-10 DIAGNOSIS — E1151 Type 2 diabetes mellitus with diabetic peripheral angiopathy without gangrene: Secondary | ICD-10-CM | POA: Diagnosis not present

## 2022-03-10 DIAGNOSIS — E1122 Type 2 diabetes mellitus with diabetic chronic kidney disease: Secondary | ICD-10-CM | POA: Diagnosis not present

## 2022-03-10 DIAGNOSIS — E1165 Type 2 diabetes mellitus with hyperglycemia: Secondary | ICD-10-CM | POA: Diagnosis not present

## 2022-03-10 LAB — BASIC METABOLIC PANEL
Anion gap: 11 (ref 5–15)
BUN: 21 mg/dL (ref 8–23)
CO2: 24 mmol/L (ref 22–32)
Calcium: 8.8 mg/dL — ABNORMAL LOW (ref 8.9–10.3)
Chloride: 99 mmol/L (ref 98–111)
Creatinine, Ser: 1.02 mg/dL — ABNORMAL HIGH (ref 0.44–1.00)
GFR, Estimated: >60 mL/min (ref >60)
Glucose, Bld: 145 mg/dL — ABNORMAL HIGH (ref 70–99)
Potassium: 4 mmol/L (ref 3.5–5.1)
Sodium: 134 mmol/L — ABNORMAL LOW (ref 135–145)

## 2022-03-10 LAB — GLUCOSE, CAPILLARY
Glucose-Capillary: 132 mg/dL — ABNORMAL HIGH (ref 70–99)
Glucose-Capillary: 293 mg/dL — ABNORMAL HIGH (ref 70–99)

## 2022-03-10 MED ORDER — GUAIFENESIN-DM 100-10 MG/5ML PO SYRP: 5.0000 mL | ORAL_SOLUTION | ORAL | 0 refills | Status: AC | PRN

## 2022-03-10 MED ORDER — BENZONATATE 100 MG PO CAPS: 100.0000 mg | ORAL_CAPSULE | Freq: Three times a day (TID) | ORAL | 0 refills | Status: AC

## 2022-03-10 MED ORDER — NIRMATRELVIR/RITONAVIR (PAXLOVID) TABLET (RENAL DOSING): 2.0000 | ORAL_TABLET | Freq: Two times a day (BID) | ORAL | 0 refills | Status: AC

## 2022-03-10 NOTE — Evaluation (Signed)
Occupational Therapy Evaluation Patient Details Name: Lynn Hobbs MRN: [id number] DOB: 10/28/1960 Today's Date: 03/10/2022   History of Present Illness Lynn Hobbs is a 62 y.o. female admitted with cough, SOB, Covid +; with PMH significant for DM2, HTN, HLD, CAD/stent, stroke with residual left-sided weakness, peripheral neuropathy, fibroid, endometriosis   Clinical Impression   Pt reports she lives with her mom, uses cane at baseline for mobility and is independent with ADLs/IADLs. Pt currently needing set up -mod A for ADLs, min guard A for bed mobility, and min A for transfers with RW. Pt with residual LUE weakness from prior CVA, however able to use functionally for BADL tasks. Pt presenting with impairments listed below, will follow acutely. Recommend HHOT at d/c.      Recommendations for follow up therapy are one component of a multi-disciplinary discharge planning process, led by the attending physician.  Recommendations may be updated based on patient status, additional functional criteria and insurance authorization.   Follow Up Recommendations  Home health OT     Assistance Recommended at Discharge Intermittent Supervision/Assistance  Patient can return home with the following A little help with walking and/or transfers;A lot of help with bathing/dressing/bathroom;Assistance with cooking/housework;Direct supervision/assist for financial management;Direct supervision/assist for medications management;Assist for transportation;Help with stairs or ramp for entrance    Functional Status Assessment  Patient has had a recent decline in their functional status and demonstrates the ability to make significant improvements in function in a reasonable and predictable amount of time.  Equipment Recommendations  BSC/3in1;Other (comment) (RW)    Recommendations for Other Services PT consult     Precautions / Restrictions Precautions Precautions: Fall Precaution Comments: COVID; L  hemiplegia Restrictions Weight Bearing Restrictions: No      Mobility Bed Mobility Overal bed mobility: Needs Assistance Bed Mobility: Sit to Supine       Sit to supine: Min guard        Transfers Overall transfer level: Needs assistance Equipment used: Rolling [place] (2 wheels) Transfers: Sit to/from Stand Sit to Stand: Min assist                  Balance Overall balance assessment: Needs assistance Sitting-balance support: Feet supported Sitting balance-Leahy Scale: Fair Sitting balance - Comments: sits EOB without support     Standing balance-Leahy Scale: Poor Standing balance comment: reliant on external support                           ADL either performed or assessed with clinical judgement   ADL Overall ADL's : Needs assistance/impaired Eating/Feeding: Set up   Grooming: Set up Grooming Details (indicate cue type and reason): washing face at EOB Upper Body Bathing: Minimal assistance   Lower Body Bathing: Moderate assistance   Upper Body Dressing : Minimal assistance   Lower Body Dressing: Moderate assistance   Toilet Transfer: Minimal assistance;BSC/3in1;Rolling [place] (2 wheels);Ambulation   Toileting- Clothing Manipulation and Hygiene: Moderate assistance       Functional mobility during ADLs: Minimal assistance;Rolling [place] (2 wheels)       Vision   Vision Assessment?: No apparent visual deficits     Perception Perception Perception Tested?: No   Praxis Praxis Praxis tested?: Not tested    Pertinent Vitals/Pain Pain Assessment Pain Assessment: No/denies pain     Hand Dominance Right   Extremity/Trunk Assessment Upper Extremity Assessment Upper Extremity Assessment: LUE deficits/detail LUE Deficits / Details: WFL for basic ADLs, keeps in  flexed/guarded position, grossly 3/5 LUE Coordination: decreased fine motor;decreased gross motor   Lower Extremity Assessment Lower Extremity Assessment: Defer to PT  evaluation   Cervical / Trunk Assessment Cervical / Trunk Assessment: Normal   Communication Communication Communication: No difficulties   Cognition Arousal/Alertness: Awake/alert Behavior During Therapy: WFL for tasks assessed/performed Overall Cognitive Status: Within Functional Limits for tasks assessed                                 General Comments: some slow processing/decreased initiation noted, suspect is baseline for pt, able to provide  PLOF and follow commands apporpriately     General Comments  VSS on RA    Exercises     Shoulder Instructions      Home Living Family/patient expects to be discharged to:: Private residence Living Arrangements: Parent Available Help at Discharge: Family;Other (Comment) Type of Home: House Home Access: Stairs to enter Entrance Stairs-Number of Steps: 5   Home Layout: One level     Bathroom Shower/Tub: Tub/shower unit         Home Equipment: Cane - single point;Shower seat;BSC/3in1          Prior Functioning/Environment Prior Level of Function : Driving;Needs assist             Mobility Comments: uses cane for community mobility only ADLs Comments: sits for showers, ind        OT Problem List: Decreased strength;Decreased range of motion;Impaired balance (sitting and/or standing);Decreased activity tolerance;Decreased safety awareness      OT Treatment/Interventions: Self-care/ADL training;Therapeutic exercise;Energy conservation;DME and/or AE instruction;Therapeutic activities;Patient/family education;Balance training    OT Goals(Current goals can be found in the care plan section) Acute Rehab OT Goals Patient Stated Goal: none stated OT Goal Formulation: With patient Time For Goal Achievement: 03/24/22 Potential to Achieve Goals: Good ADL Goals Pt Will Perform Lower Body Dressing: with min guard assist;sit to/from stand Pt Will Transfer to Toilet: with min guard assist;ambulating;regular  height toilet Pt Will Perform Tub/Shower Transfer: with min guard assist;ambulating;shower seat;rolling [place]  OT Frequency: Min 2X/week    Co-evaluation              AM-PAC OT "6 Clicks" Daily Activity     Outcome Measure Help from another person eating meals?: None Help from another person taking care of personal grooming?: A Little Help from another person toileting, which includes using toliet, bedpan, or urinal?: A Little Help from another person bathing (including washing, rinsing, drying)?: A Lot Help from another person to put on and taking off regular upper body clothing?: A Little Help from another person to put on and taking off regular lower body clothing?: A Lot 6 Click Score: 17   End of Session Equipment Utilized During Treatment: Rolling [place] (2 wheels);Gait belt Nurse Communication: Mobility status  Activity Tolerance: Patient tolerated treatment well Patient left: in bed;with call bell/phone within reach;with nursing/sitter in room (NT placing new purewick for pt)  OT Visit Diagnosis: Unsteadiness on feet (R26.81);Other abnormalities of gait and mobility (R26.89);Muscle weakness (generalized) (M62.81)                Time: 3710-6269 OT Time Calculation (min): 32 min Charges:  OT General Charges $OT Visit: 1 Visit OT Evaluation $OT Eval Moderate Complexity: 1 Mod OT Treatments $Self Care/Home Management : 8-22 mins  Renaye Rakers, OTD, OTR/L SecureChat Preferred Acute Rehab [phone] - [place]  03/10/2022, 8:45 AM

## 2022-03-10 NOTE — Progress Notes (Signed)
Mobility Specialist: Progress Note   03/10/22 1240  Mobility  Activity Ambulated with assistance in room  Level of Assistance Moderate assist, patient does 50-74%  Assistive Device Front wheel Shuey  Distance Ambulated (ft) 30 ft  Activity Response Tolerated well  Mobility Referral Yes  $Mobility charge 1 Mobility   Pt received sitting EOB and agreeable to mobility. ModA to stand and contact guard during ambulation. No c/o throughout. Pt sitting EOB after session with call bell at her side waiting to discharge.  Dawson Demitria Hay Mobility Specialist Please contact via SecureChat or Rehab office at [phone]

## 2022-03-10 NOTE — Discharge Summary (Signed)
Physician Discharge Summary  Lynn Hobbs KFM:[id number] DOB: [birth year] DOA: 03/08/2022  PCP: Lynn Hobbs, Rayford Halsted, MD  Admit date: 03/08/2022 Discharge date: 03/10/2022  Admitted From: Home Discharge disposition: Home with home health PT  Recommendations at discharge:  Complete the remaining days of Paxlovid at home.   As needed cough medicines  Insulin regimen as before currently your blood pressure is controlled on Coreg only.  Amlodipine, losartan and Aldactone are on hold.  I would continue to hold the at discharge.  Monitor blood pressure level at home and resume other meds gradually, 1 at a time if blood pressure elevated to 677 mm Hg systolic   Brief narrative: Lynn Hobbs is a 62 y.o. female with PMH significant for DM2, HTN, HLD, CAD/stent, stroke with residual left-sided weakness, peripheral neuropathy, fibroid, endometriosis who lives at home with her mother.  Her mother had cough, shortness of breath which patient caught few days ago she became more sick than her mother.  She had a fall on 12/28, did not hit head.  She has been having gradual worsening lethargy and hence presented to the ED.  In the ED, patient had temperature 100.2, heart rate 80s, blood pressure 103/57, breathing on room air Labs with sodium 136, potassium 3.9, BUN/creatinine 25/1.42, WBC count 8.3, hemoglobin 10.5 Glucose level elevated to 275 Respiratory virus panel positive for COVID Chest x-ray unremarkable Urinalysis showed hazy yellow urine with trace leukocytes, many bacteria CT did not show acute abnormality, showed chronic sequelae following remote right cerebellar infarct.  With the degree of weakness, patient did not feel comfortable going home Admitted to [facility]  Subjective: Patient was seen and examined this morning.   Propped up in bed.  Not in distress.  Feels better.  Able to ambulate inside the room with PT.  No recurrence of fever.  Brother at bedside.  Mother on the  phone  Hospital course: COVID infection Presented with cough, shortness of breath, URI symptoms COVID test: COVID PCR positive in the ED Chest imaging: Chest x-ray unremarkable Treatment: Currently on a 5-day course of Paxlovid.  Not hypoxic to require steroids Clinically improving.  Able to ambulate on the hallway with PT.  Not requiring supplemental oxygen Discharged to complete 5-day course of Paxlovid. Continue supportive care with antitussives, PRN inhalers, Tylenol. Encouraged incentive spirometry Recent Labs  Lab 03/08/22 1013 03/08/22 1440 03/09/22 0326  SARSCOV2NAA POSITIVE*  --   --   WBC  --  8.3 8.8  ALT 21  --   --    Uncontrolled type 2 diabetes mellitus with hyperglycemia A1c 9.2 on 02/14/22 Blood sugar level elevated over 200. PTA on Trulicity 0.34 mg weekly, Lantus 50 units daily, Premeal insulin 30 units 3 times daily Continue the same regimen at home  AKI on CKD 3a Baseline creatinine 1.1 from March 2023.  Presented with creatinine elevated to 1.42.  Improved with IV fluid.   Recent Labs    05/27/21 0951 03/08/22 1013 03/09/22 0326 03/10/22 0345  BUN 24 25* 27* 21  CREATININE 1.10* 1.42* 1.28* 1.02*   Chronic mild systolic CHF HTN PTA on Coreg 25 mg twice daily, chlorthalidone 50 mg daily, amlodipine 10 mg daily, losartan 100 mg daily, Aldactone 25 mg daily Currently continued on Coreg only.  Others on hold.  Heart rate and blood pressure stable.  I would keep others on hold at discharge.  Monitor blood pressure level at home and resume other meds gradually.  CAD/stent On aspirin and statin  HLD PTA on Repatha, Lipitor, Zetia  Stroke with residual left-sided weakness CT head showed showed chronic sequelae following remote right cerebellar infarct.  Impaired mobility Generalized weakness PT eval ordered  Wounds: -    Discharge Exam:   Vitals:   03/09/22 1702 03/09/22 2022 03/10/22 0456 03/10/22 0817  BP: 130/75 119/70 (!) 123/55 131/72   Pulse: 78 84 75 88  Resp:  _0 Temp: 98.4 F (36.9 C) 98.5 F (36.9 C) 98.7 F (37.1 C) 98.9 F (37.2 C)  TempSrc: Oral Oral Oral Oral  SpO2: 99% 100% 96% 99%  Weight:      Height:        Body mass index is 36.73 kg/m.   General exam: Pleasant, middle-aged African-American female.  Skin: No rashes, lesions or ulcers. HEENT: Atraumatic, normocephalic, no obvious bleeding Lungs: Diminished air entry in both bases.  Coughs on deep breathing CVS: Regular rate and rhythm, no murmur GI/Abd soft, nontender, nondistended, bowel sound present CNS: Alert, awake, oriented x 3.  Slow to respond.  Baseline left-sided deficits from previous stroke Psychiatry: Mood appropriate Extremities: No pedal edema, no calf redness  Follow ups:    Follow-up Information     [place] [place] Follow up.   Specialty: Home Health Services Why: Home health has been arranged. They will contact you within 48hrs post discharge to schedule apt. Contact information: [address] [place] [address] [phone]         Lynn Hobbs, Rayford Halsted, MD Follow up.   Specialty: Internal Medicine Contact information: [place] [address] [phone]                 Discharge Instructions:   Discharge Instructions     Call MD for:  difficulty breathing, headache or visual disturbances   Complete by: As directed    Call MD for:  extreme fatigue   Complete by: As directed    Call MD for:  hives   Complete by: As directed    Call MD for:  persistant dizziness or light-headedness   Complete by: As directed    Call MD for:  persistant nausea and vomiting   Complete by: As directed    Call MD for:  severe uncontrolled pain   Complete by: As directed    Call MD for:  temperature >100.4   Complete by: As directed    Diet - low sodium heart healthy   Complete by: As directed    Diet Carb Modified   Complete by: As directed     Discharge instructions   Complete by: As directed    Recommendations at discharge:   Complete the remaining days of Paxlovid at home.    As needed cough medicines   Insulin regimen as before  currently your blood pressure is controlled on Coreg only.  Amlodipine, losartan and Aldactone are on hold.  I would continue to hold the at discharge.  Monitor blood pressure level at home and resume other meds gradually, 1 at a time if blood pressure elevated to 638 mm Hg systolic  Discharge instructions for diabetes mellitus: Check blood sugar 3 times a day and bedtime at home. If blood sugar running above 200 or less than 70 please call your MD to adjust insulin. If you notice signs and symptoms of hypoglycemia (low blood sugar) like jitteriness, confusion, thirst, tremor and sweating, please check blood sugar, drink sugary drink/biscuits/sweets to increase sugar level and  call MD or return to ER.    General discharge instructions: Follow with Primary MD Lynn Hobbs, Rayford Halsted, MD in 7 days  Please request your PCP  to go over your hospital tests, procedures, radiology results at the follow up. Please get your medicines reviewed and adjusted.  Your PCP may decide to repeat certain labs or tests as needed. Do not drive, operate heavy machinery, perform activities at heights, swimming or participation in water activities or provide baby sitting services if your were admitted for syncope or siezures until you have seen by Primary MD or a Neurologist and advised to do so again. [place] Controlled Substance Reporting System database was reviewed. Do not drive, operate heavy machinery, perform activities at heights, swim, participate in water activities or provide baby-sitting services while on medications for pain, sleep and mood until your outpatient physician has reevaluated you and advised to do so again.  You are strongly recommended to comply with the dose, frequency and duration of  prescribed medications. Activity: As tolerated with Full fall precautions use [place]/cane & assistance as needed Avoid using any recreational substances like cigarette, tobacco, alcohol, or non-prescribed drug. If you experience worsening of your admission symptoms, develop shortness of breath, life threatening emergency, suicidal or homicidal thoughts you must seek medical attention immediately by calling 911 or calling your MD immediately  if symptoms less severe. You must read complete instructions/literature along with all the possible adverse reactions/side effects for all the medicines you take and that have been prescribed to you. Take any new medicine only after you have completely understood and accepted all the possible adverse reactions/side effects.  Wear Seat belts while driving. You were cared for by a hospitalist during your hospital stay. If you have any questions about your discharge medications or the care you received while you were in the hospital after you are discharged, you can call the unit and ask to speak with the hospitalist or the covering physician. Once you are discharged, your primary care physician will handle any further medical issues. Please note that NO REFILLS for any discharge medications will be authorized once you are discharged, as it is imperative that you return to your primary care physician (or establish a relationship with a primary care physician if you do not have one).   Increase activity slowly   Complete by: As directed        Discharge Medications:   Allergies as of 03/10/2022       Reactions   Tape Rash        Medication List     STOP taking these medications    amLODipine 10 MG tablet Commonly known as: NORVASC   chlorthalidone 50 MG tablet Commonly known as: HYGROTON   losartan 100 MG tablet Commonly known as: COZAAR   spironolactone 25 MG tablet Commonly known as: ALDACTONE       TAKE these medications    Accu-Chek  Guide Me w/Device Kit 1 Device by Does not apply route 3 (three) times daily.   Accu-Chek Guide test strip Generic drug: glucose blood 1 each by Other route 3 (three) times daily. Dx E11.40 and Z79.4   Accu-Chek Guide test strip Generic drug: glucose blood 1 each by Other route 3 (three) times daily. Use as instructed   Accu-Chek Softclix Lancets lancets Use 3 times daily to check glucose.  Dx E11.40 and Z79.4   acetaminophen 650 MG CR tablet Commonly known as: TYLENOL Take 1,300 mg by mouth daily as  needed for pain.   aspirin EC 81 MG tablet Take 81 mg by mouth daily.   atorvastatin 80 MG tablet Commonly known as: LIPITOR TAKE 1 TABLET(80 MG) BY MOUTH AT BEDTIME   BD Pen Needle Nano 2nd Gen 32G X 4 MM Misc Generic drug: Insulin Pen Needle 1 Device by Other route 4 (four) times daily.   benzonatate 100 MG capsule Commonly known as: Tessalon Perles Take 1 capsule (100 mg total) by mouth 3 (three) times daily for 5 days.   carvedilol 25 MG tablet Commonly known as: COREG TAKE 2 TABLETS(50 MG) BY MOUTH TWICE DAILY   clopidogrel 75 MG tablet Commonly known as: PLAVIX Take 1 tablet (75 mg total) by mouth daily.   Dexcom G7 Sensor Misc 1 Device by Does not apply route as directed.   diclofenac Sodium 1 % Gel Commonly known as: VOLTAREN APPLY 4 GRAMS TOPICALLY TO THE AFFECTED AREA FOUR TIMES DAILY   diphenoxylate-atropine 2.5-0.025 MG tablet Commonly known as: LOMOTIL Take 1 tablet by mouth 4 (four) times daily as needed for diarrhea or loose stools.   ezetimibe 10 MG tablet Commonly known as: ZETIA TAKE 1 TABLET(10 MG) BY MOUTH DAILY   guaiFENesin-dextromethorphan 100-10 MG/5ML syrup Commonly known as: ROBITUSSIN DM Take 5 mLs by mouth every 4 (four) hours as needed for up to 5 days for cough.   Lantus SoloStar 100 UNIT/ML Solostar Pen Generic drug: insulin glargine Inject 50 Units into the skin daily.   nirmatrelvir/ritonavir (renal dosing) 10 x 150 MG & 10  x 100MG Tabs Commonly known as: PAXLOVID Take 2 tablets by mouth 2 (two) times daily for 5 days. Patient GFR is >60. Take nirmatrelvir (150 mg) one tablet twice daily for 5 days and ritonavir (100 mg) one tablet twice daily for 5 days.   nitroGLYCERIN 0.4 MG SL tablet Commonly known as: Nitrostat PLACE ONE TABLET UNDER THE TONGUE EVERY 5 MINUTES FOR 3 DOSES AS NEEDED FOR CHEST PAIN   NovoLOG FlexPen 100 UNIT/ML FlexPen Generic drug: insulin aspart Max daily 70 units daily   potassium chloride SA 20 MEQ tablet Commonly known as: KLOR-CON M TAKE 1 TABLET BY MOUTH DAILY   Repatha SureClick 765 MG/ML Soaj Generic drug: Evolocumab INJECT 1 PEN(140 MG) INTO THE SKIN EVERY 14 DAYS   Trulicity 4.65 KP/5.4SF Sopn Generic drug: Dulaglutide Inject 0.75 mg into the skin once a week.   Vitamin D-3 125 MCG (5000 UT) Tabs Take 1 tablet by mouth daily.         The results of significant diagnostics from this hospitalization (including imaging, microbiology, ancillary and laboratory) are listed below for reference.    Procedures and Diagnostic Studies:   CT HEAD WO CONTRAST (5MM)  Result Date: 03/08/2022 CLINICAL DATA:  Neuro deficit, acute, stroke EXAM: CT HEAD WITHOUT CONTRAST TECHNIQUE: Contiguous axial images were obtained from the base of the skull through the vertex without intravenous contrast. RADIATION DOSE REDUCTION: This exam was performed according to the departmental dose-optimization program which includes automated exposure control, adjustment of the mA and/or kV according to patient size and/or use of iterative reconstruction technique. COMPARISON:  11/13/2016 FINDINGS: Brain: Low-density changes in the right cerebellar hemisphere are new since 2018. No evidence of intracranial hemorrhage, hydrocephalus, extra-axial collection or mass lesion/mass effect. Areas of encephalomalacia in the right frontotemporal and left cerebellar regions are unchanged, compatible with remote  infarcts. Scattered low-density changes within the periventricular and subcortical white matter compatible with chronic microvascular ischemic change. Mild diffuse cerebral volume  loss. Vascular: Atherosclerotic calcifications involving the large vessels of the skull base. No unexpected hyperdense vessel. Skull: Normal. Negative for fracture or focal lesion. Sinuses/Orbits: No acute finding. Other: None. IMPRESSION: 1. Low-density changes in the right cerebellar hemisphere are new since 2018 but favored to represent sequela of chronic infarct. Further evaluation with MRI can be performed to better assess. 2. Otherwise, no acute intracranial findings. 3. Chronic microvascular ischemic change and cerebral volume loss. 4. Remote right frontotemporal and left cerebellar infarcts. Electronically Signed   By: Davina Poke D.O.   On: 03/08/2022 11:41   DG Chest 2 View  Result Date: 03/08/2022 CLINICAL DATA:  Cough EXAM: CHEST - 2 VIEW COMPARISON:  03/24/2013 FINDINGS: The heart size and mediastinal contours are within normal limits. Low lung volumes. No focal airspace consolidation, pleural effusion, or pneumothorax. Degenerative thoracic spondylosis. IMPRESSION: No active cardiopulmonary disease. Electronically Signed   By: Davina Poke D.O.   On: 03/08/2022 10:47     Labs:   Basic Metabolic Panel: Recent Labs  Lab 03/08/22 1013 03/09/22 0326 03/10/22 0345  NA 136 135 134*  K 3.9 4.4 4.0  CL 102 104 99  CO2 _0 GLUCOSE 275* 171* 145*  BUN 25* 27* 21  CREATININE 1.42* 1.28* 1.02*  CALCIUM 9.4 9.2 8.8*   GFR Estimated Creatinine Clearance: 65.6 mL/min (A) (by C-G formula based on SCr of 1.02 mg/dL (H)). Liver Function Tests: Recent Labs  Lab 03/08/22 1013  AST 37  ALT 21  ALKPHOS 39  BILITOT 0.5  PROT 7.3  ALBUMIN 3.4*   No results for input(s): "LIPASE", "AMYLASE" in the last 168 hours. No results for input(s): "AMMONIA" in the last 168 hours. Coagulation profile No  results for input(s): "INR", "PROTIME" in the last 168 hours.  CBC: Recent Labs  Lab 03/08/22 1440 03/09/22 0326  WBC 8.3 8.8  NEUTROABS 6.0  --   HGB 10.5* 10.5*  HCT 32.2* 31.7*  MCV 81.3 80.3  PLT 151 150   Cardiac Enzymes: No results for input(s): "CKTOTAL", "CKMB", "CKMBINDEX", "TROPONINI" in the last 168 hours. BNP: Invalid input(s): "POCBNP" CBG: Recent Labs  Lab 03/09/22 0837 03/09/22 1253 03/09/22 1626 03/09/22 2118 03/10/22 0800  GLUCAP 143* 261* 172* 252* 132*   D-Dimer No results for input(s): "DDIMER" in the last 72 hours. Hgb A1c No results for input(s): "HGBA1C" in the last 72 hours. Lipid Profile No results for input(s): "CHOL", "HDL", "LDLCALC", "TRIG", "CHOLHDL", "LDLDIRECT" in the last 72 hours. Thyroid function studies No results for input(s): "TSH", "T4TOTAL", "T3FREE", "THYROIDAB" in the last 72 hours.  Invalid input(s): "FREET3" Anemia work up No results for input(s): "VITAMINB12", "FOLATE", "FERRITIN", "TIBC", "IRON", "RETICCTPCT" in the last 72 hours. Microbiology Recent Results (from the past 240 hour(s))  Resp panel by RT-PCR (RSV, Flu A&B, Covid) Anterior Nasal Swab     Status: Abnormal   Collection Time: 03/08/22 10:13 AM   Specimen: Anterior Nasal Swab  Result Value Ref Range Status   SARS Coronavirus 2 by RT PCR POSITIVE (A) NEGATIVE Final    Comment: (NOTE) SARS-CoV-2 target nucleic acids are DETECTED.  The SARS-CoV-2 RNA is generally detectable in upper respiratory specimens during the acute phase of infection. Positive results are indicative of the presence of the identified virus, but do not rule out bacterial infection or co-infection with other pathogens not detected by the test. Clinical correlation with patient history and other diagnostic information is necessary to determine patient infection status. The expected result is Negative.  Fact Sheet for Patients: EntrepreneurPulse.com.au  Fact Sheet for  Healthcare Providers: IncredibleEmployment.be  This test is not yet approved or cleared by the [place] FDA and  has been authorized for detection and/or diagnosis of SARS-CoV-2 by FDA under an Emergency Use Authorization (EUA).  This EUA will remain in effect (meaning this test can be used) for the duration of  the COVID-19 declaration under Section 564(b)(1) of the A ct, 21 U.S.C. section 360bbb-3(b)(1), unless the authorization is terminated or revoked sooner.     Influenza A by PCR NEGATIVE NEGATIVE Final   Influenza B by PCR NEGATIVE NEGATIVE Final    Comment: (NOTE) The Xpert Xpress SARS-CoV-2/FLU/RSV plus assay is intended as an aid in the diagnosis of influenza from Nasopharyngeal swab specimens and should not be used as a sole basis for treatment. Nasal washings and aspirates are unacceptable for Xpert Xpress SARS-CoV-2/FLU/RSV testing.  Fact Sheet for Patients: EntrepreneurPulse.com.au  Fact Sheet for Healthcare Providers: IncredibleEmployment.be  This test is not yet approved or cleared by the [place] FDA and has been authorized for detection and/or diagnosis of SARS-CoV-2 by FDA under an Emergency Use Authorization (EUA). This EUA will remain in effect (meaning this test can be used) for the duration of the COVID-19 declaration under Section 564(b)(1) of the Act, 21 U.S.C. section 360bbb-3(b)(1), unless the authorization is terminated or revoked.     Resp Syncytial Virus by PCR NEGATIVE NEGATIVE Final    Comment: (NOTE) Fact Sheet for Patients: EntrepreneurPulse.com.au  Fact Sheet for Healthcare Providers: IncredibleEmployment.be  This test is not yet approved or cleared by the [place] FDA and has been authorized for detection and/or diagnosis of SARS-CoV-2 by FDA under an Emergency Use Authorization (EUA). This EUA will remain in effect (meaning this  test can be used) for the duration of the COVID-19 declaration under Section 564(b)(1) of the Act, 21 U.S.C. section 360bbb-3(b)(1), unless the authorization is terminated or revoked.  Performed at [facility], [place] [address], [place], [place] [address]     Time coordinating discharge: 35 minutes  Signed: Lautaro Koral  [facility] 03/10/2022, 11:43 AM

## 2022-03-10 NOTE — Progress Notes (Signed)
DISCHARGE NOTE HOME Lynn Hobbs to be discharged Home per MD order. Discussed prescriptions and follow up appointments with the patient. Prescriptions given to patient; medication list explained in detail. Patient verbalized understanding.  Skin clean, dry and intact without evidence of skin break down, no evidence of skin tears noted. IV catheter discontinued intact. Site without signs and symptoms of complications. Dressing and pressure applied. Pt denies pain at the site currently. No complaints noted.  Patient free of lines, drains, and wounds.   An After Visit Summary (AVS) was printed and given to the patient. Patient escorted via wheelchair, and discharged home via private auto.  Hassell Halim, LPN

## 2022-03-10 NOTE — TOC Initial Note (Signed)
Transition of Care [facility]) - Initial/Assessment Note    Patient Details  Name: Lynn Hobbs MRN: [id number] Date of Birth: October 14, 1960  Transition [facility]) CM/SW Contact:    Cyndi Bender, RN Phone Number: 03/10/2022, 10:26 AM  Clinical Narrative:       Orders for home health PT.           Choice offered and patient deferred to [facility] to find highly rated agency.  Levada Dy with [facility] accepted referral. Patient has a [facility] and [place].  Patient has transportation. Address, Phone number and PCP verified. No other TOC needs at this time.  Expected Discharge Plan: [place] Barriers to Discharge: Continued Medical Work up   Patient Goals and CMS Choice Patient states their goals for this hospitalization and ongoing recovery are:: return home CMS Medicare.gov Compare Post Acute Care list provided to:: Patient Choice offered to / list presented to : Patient      Expected Discharge Plan and Services   Discharge Planning Services: CM Consult Post Acute Care Choice: [place] arrangements for the past 2 months: [place]: PT [place]: [place] Date [place]: 03/10/22 Time [place]: 1025 Representative spoke with at [place]: [place] Arrangements/Services Living arrangements for the past 2 months: [place] with:: Self Patient language and need for interpreter reviewed:: Yes Do you feel safe going back to the place where you live?: Yes      Need for Family Participation in Patient Care: Yes (Comment) Care giver support system in place?: Yes (comment) Current home services: DME ([place], [facility]) Criminal Activity/Legal Involvement Pertinent to Current Situation/Hospitalization: No - Comment as needed  Activities of Daily Living      Permission Sought/Granted         Permission granted to share info w AGENCY: [facility]         Emotional Assessment   Attitude/Demeanor/Rapport: Gracious Affect (typically observed): Accepting Orientation: : Oriented to Self, Oriented to Place, Oriented to Situation, Oriented to  Time Alcohol / Substance Use: Not Applicable Psych Involvement: No (comment)  Admission diagnosis:  AKI (acute kidney injury) ([place]) [N17.9] COVID [U07.1] COVID-19 virus infection [U07.1] Patient Active Problem List   Diagnosis Date Noted   COVID-19 virus infection 03/08/2022   Type 2 diabetes mellitus with hyperglycemia, with long-term current use of insulin ([place]) 02/14/2022   Type 2 diabetes mellitus with stage 3a chronic kidney disease, with long-term current use of insulin ([place]) 02/14/2022   Type 2 diabetes mellitus with diabetic polyneuropathy, with long-term current use of insulin ([place]) 02/14/2022   Diabetes mellitus ([place]) 02/14/2022   Stroke ([place])    Chronic kidney disease (CKD) 08/22/2015   Type 2 diabetes mellitus with diabetic neuropathy, with long-term current use of insulin ([place]) 04/04/2015   Old MI (myocardial infarction) 11/16/2014   Obesity (BMI 30-39.9) 11/03/2013   ACS (acute coronary syndrome) ([place]) 03/24/2013   ST elevation myocardial infarction (STEMI) of anterior wall ([place]) 03/24/2013   Cerebrovascular disease 12/11/2008   Dyslipidemia 10/12/2006   Essential hypertension 10/12/2006   PCP:  Erline Hau, MD Pharmacy:   [place], [place] AT [facility] [place] [address] Phone: [phone] Fax: [phone]  Social Determinants of Health (SDOH) Social History: SDOH Screenings   Food Insecurity: No Food Insecurity (10/08/2021)  Housing: Low Risk  (10/08/2021)  Transportation Needs: No Transportation Needs (10/08/2021)  Depression (PHQ2-9): Low Risk  (12/17/2021)  Financial Resource Strain: Unknown (02/05/2021)  Physical Activity: Unknown (02/05/2021)  Social Connections: Unknown (02/05/2021)   Stress: Unknown (02/05/2021)  Tobacco Use: Low Risk  (03/08/2022)   SDOH Interventions:     Readmission Risk Interventions     No data to display

## 2022-03-10 NOTE — Progress Notes (Signed)
  Transition of Care (TOC) Screening Note   Patient Details  Name: Lynn Hobbs Date of Birth: 06/24/1960   Transition of Care [facility]) CM/SW Contact:    Cyndi Bender, RN Phone Number: 03/10/2022, 8:50 AM    Transition of Care Department [facility]) has reviewed patient and no TOC needs have been identified at this time. We will continue to monitor patient advancement through interdisciplinary progression rounds. If new patient transition needs arise, please place a TOC consult.

## 2022-03-12 ENCOUNTER — Telehealth: Payer: Self-pay | Admitting: Internal Medicine

## 2022-03-12 NOTE — Telephone Encounter (Signed)
[facility]a [facility] is calling there will be a delay of care for PT due to has covid

## 2022-03-14 ENCOUNTER — Telehealth: Payer: Self-pay | Admitting: Internal Medicine

## 2022-03-14 NOTE — Telephone Encounter (Signed)
   Transition Care Management Follow-up Telephone Call Date of discharge and from where: 03/10/22 from [facility] How have you been since you were released from the hospital? Lelon Frohlich states that "pt is much stronger" now. Pt is eating/sleeping well, drinking plenty fluids. Any questions or concerns? Yes  Lelon Frohlich is asking if patient can take Delsym now since out of Robutussin.   Items Reviewed: Did the pt receive and understand the discharge instructions provided? Yes  Medications obtained and verified? Yes  Other?  N/a Any new allergies since your discharge? No  Dietary orders reviewed? Yes Do you have support at home? Yes   Home Care and Equipment/Supplies: Were home health services ordered? yes If so, what is the name of the agency? [facility]  Has the agency set up a time to come to the patient's home? yes Were any new equipment or medical supplies ordered?  No    Functional Questionnaire: (I = Independent and D = Dependent) ADLs: I  Bathing/Dressing- I  Meal Prep- I  Eating- I  Maintaining continence- I  Transferring/Ambulation- I  Managing Meds- I  Follow up appointments reviewed:  PCP Hospital f/u appt confirmed? Yes  Scheduled to see Dr Jerilee Hoh on 03/20/22 @ [facility] f/u appt confirmed?  N/a   Are transportation arrangements needed? No  If their condition worsens, is the pt aware to call PCP or go to the Emergency Dept.? Yes Was the patient provided with contact information for the PCP's office or ED? Yes Was to pt encouraged to call back with questions or concerns? Yes

## 2022-03-14 NOTE — Telephone Encounter (Signed)
Pt was discharged from the hospital on Monday at [facility] [address] with Covid. Pt was offered a HFU. Pt's Mom says Pt is unable to come in because she is still very sick.  Mom called to say Pt's cough is still very bad and is wondering if MD could send something to:  [facility] [place], [place] AT [facility] Phone: [phone]  Fax: [phone]

## 2022-03-17 ENCOUNTER — Inpatient Hospital Stay: Payer: Medicare Other | Admitting: Internal Medicine

## 2022-03-20 ENCOUNTER — Inpatient Hospital Stay: Payer: Medicare Other | Admitting: Internal Medicine

## 2022-03-20 ENCOUNTER — Ambulatory Visit: Payer: Medicare Other | Admitting: Internal Medicine

## 2022-03-20 ENCOUNTER — Telehealth: Payer: Self-pay | Admitting: Internal Medicine

## 2022-03-20 NOTE — Telephone Encounter (Signed)
Pt's mother called at 8:28 am to say Pt has bad diarrhea and cannot leave the house. Will call back to reschedule today's appointment.  Mom is asking what can she give Pt to help with the diarrhea?  Please advise.

## 2022-03-20 NOTE — Telephone Encounter (Signed)
Patient is aware 

## 2022-05-08 ENCOUNTER — Other Ambulatory Visit: Payer: Self-pay | Admitting: Cardiovascular Disease

## 2022-05-08 DIAGNOSIS — I2510 Atherosclerotic heart disease of native coronary artery without angina pectoris: Secondary | ICD-10-CM

## 2022-05-09 ENCOUNTER — Other Ambulatory Visit: Payer: Self-pay | Admitting: Cardiovascular Disease

## 2022-05-09 DIAGNOSIS — I2510 Atherosclerotic heart disease of native coronary artery without angina pectoris: Secondary | ICD-10-CM

## 2022-05-28 DIAGNOSIS — Z23 Encounter for immunization: Secondary | ICD-10-CM | POA: Diagnosis not present

## 2022-06-04 ENCOUNTER — Other Ambulatory Visit: Payer: Self-pay | Admitting: Cardiovascular Disease

## 2022-06-04 DIAGNOSIS — I2510 Atherosclerotic heart disease of native coronary artery without angina pectoris: Secondary | ICD-10-CM

## 2022-06-27 ENCOUNTER — Other Ambulatory Visit: Payer: Self-pay | Admitting: Internal Medicine

## 2022-06-27 DIAGNOSIS — I10 Essential (primary) hypertension: Secondary | ICD-10-CM

## 2022-06-27 DIAGNOSIS — I2102 ST elevation (STEMI) myocardial infarction involving left anterior descending coronary artery: Secondary | ICD-10-CM

## 2022-06-27 DIAGNOSIS — E785 Hyperlipidemia, unspecified: Secondary | ICD-10-CM

## 2022-06-27 DIAGNOSIS — I2510 Atherosclerotic heart disease of native coronary artery without angina pectoris: Secondary | ICD-10-CM

## 2022-06-27 DIAGNOSIS — Z79899 Other long term (current) drug therapy: Secondary | ICD-10-CM

## 2022-07-04 ENCOUNTER — Telehealth: Payer: Self-pay

## 2022-07-04 ENCOUNTER — Ambulatory Visit (INDEPENDENT_AMBULATORY_CARE_PROVIDER_SITE_OTHER): Payer: Medicare Other | Admitting: Internal Medicine

## 2022-07-04 ENCOUNTER — Encounter: Payer: Self-pay | Admitting: Internal Medicine

## 2022-07-04 VITALS — BP 126/80 | HR 81 | Ht 64.0 in | Wt 216.0 lb

## 2022-07-04 DIAGNOSIS — E1165 Type 2 diabetes mellitus with hyperglycemia: Secondary | ICD-10-CM | POA: Diagnosis not present

## 2022-07-04 DIAGNOSIS — E1159 Type 2 diabetes mellitus with other circulatory complications: Secondary | ICD-10-CM | POA: Diagnosis not present

## 2022-07-04 DIAGNOSIS — Z794 Long term (current) use of insulin: Secondary | ICD-10-CM | POA: Diagnosis not present

## 2022-07-04 DIAGNOSIS — Z7985 Long-term (current) use of injectable non-insulin antidiabetic drugs: Secondary | ICD-10-CM

## 2022-07-04 DIAGNOSIS — E1142 Type 2 diabetes mellitus with diabetic polyneuropathy: Secondary | ICD-10-CM | POA: Diagnosis not present

## 2022-07-04 LAB — POCT GLYCOSYLATED HEMOGLOBIN (HGB A1C): Hemoglobin A1C: 8.3 % — AB (ref 4.0–5.6)

## 2022-07-04 LAB — POCT GLUCOSE (DEVICE FOR HOME USE): Glucose Fasting, POC: 244 mg/dL — AB (ref 70–99)

## 2022-07-04 MED ORDER — TRESIBA FLEXTOUCH 200 UNIT/ML ~~LOC~~ SOPN: 54.0000 [IU] | PEN_INJECTOR | Freq: Every day | SUBCUTANEOUS | 3 refills | Status: AC

## 2022-07-04 MED ORDER — LANTUS SOLOSTAR 100 UNIT/ML ~~LOC~~ SOPN: 54.0000 [IU] | PEN_INJECTOR | Freq: Every day | SUBCUTANEOUS | 6 refills | Status: DC

## 2022-07-04 NOTE — Patient Instructions (Signed)
Increase  Lantus 54 units once daily  Continue Novolog 32 units Before each meal  Start Trulicity 0.75 mg weekly      HOW TO TREAT LOW BLOOD SUGARS (Blood sugar LESS THAN 70 MG/DL) Please follow the RULE OF 15 for the treatment of hypoglycemia treatment (when your (blood sugars are less than 70 mg/dL)   STEP 1: Take 15 grams of carbohydrates when your blood sugar is low, which includes:  3-4 GLUCOSE TABS  OR 3-4 OZ OF JUICE OR REGULAR SODA OR ONE TUBE OF GLUCOSE GEL    STEP 2: RECHECK blood sugar in 15 MINUTES STEP 3: If your blood sugar is still low at the 15 minute recheck --> then, go back to STEP 1 and treat AGAIN with another 15 grams of carbohydrates.

## 2022-07-04 NOTE — Progress Notes (Signed)
Name: Lynn Hobbs  MRN/ DOB: [id number], 12/19/1960   Age/ Sex: 62 y.o., female    PCP: Philip Aspen, Limmie Patricia, MD   Reason for Endocrinology Evaluation: Type 2 Diabetes Mellitus     Date of Initial Endocrinology Visit: 02/14/2022    Lynn Hobbs IDENTIFIER: Lynn Hobbs is a 62 y.o. female with a past medical history of DM, CAD, and HTN. The Lynn Hobbs presented for initial endocrinology clinic visit on 02/14/2022 for consultative assistance with her diabetes management.    HPI: Lynn Hobbs  Diagnosed with DM in 2004 Prior Medications tried/Intolerance: Metformin- does not recall reason for discontinuation , Invokana - yeast infection                Hemoglobin A1c has ranged from 8.4% in 2020, peaking at 11.0% in 2015 .    She was seen by Dr. Elvera Lennox in 2014 and 2017.   On her initial visit to our clinic she had an A1c of 9.2%, she was on Levemir and NovoLog, which we adjusted and started Trulicity   I attempted to prescribe Dexcom G7 but she did not feel comfortable using it.  SUBJECTIVE:   During the last visit (02/14/2022): 9.2%  Today (07/04/22): Lynn Hobbs is here for follow-up on diabetes management.  She checks her blood sugars 0 times daily.    Since her last visit here she was diagnosed with COVID She did not start Trulicity for unknown reason  Denies nausea, vomiting or diarrhea   HOME DIABETES REGIMEN: Trulicity 0.75 milligram weekly Lantus 50 units at night  NovoLog 32 units before each meal     Statin: Lynn Hobbs on statin and Repatha ACE-I/ARB: Yes   METER DOWNLOAD SUMMARY:    DIABETIC COMPLICATIONS: Microvascular complications:  DR (s/p laser surgery, neuropathy, CKD Last eye exam: Completed 2020, schedule 12/2022  Macrovascular complications:  S/p STEMI 2015, CVA Denies:  PVD   PAST HISTORY: Past Medical History:  Past Medical History:  Diagnosis Date   BENIGN NEOPLASM OF SKIN SITE UNSPECIFIED 09/08/2008   CEREBROVASCULAR DISEASE  12/11/2008   DIABETES MELLITUS, TYPE II 10/12/2006   Endometriosis    Fibroids    HYPERLIPIDEMIA 10/12/2006   HYPERTENSION 10/12/2006   PERIPHERAL NEUROPATHY 10/21/2006   Stroke [facility])    Past Surgical History:  Past Surgical History:  Procedure Laterality Date   ABDOMINAL HYSTERECTOMY     LEFT HEART CATHETERIZATION WITH CORONARY ANGIOGRAM N/A 03/24/2013   Procedure: LEFT HEART CATHETERIZATION WITH CORONARY ANGIOGRAM;  Surgeon: Lennette Bihari, MD;  Location: [facility] C[facility] LAB;  Service: Cardiovascular;  Laterality: N/A;    Social History:  reports that she has never smoked. She has never used smokeless tobacco. She reports that she does not drink alcohol and does not use drugs. Family History: No family history on file.   HOME MEDICATIONS: Allergies as of 07/04/2022       Reactions   Tape Rash        Medication List        Accurate as of July 04, 2022 10:36 AM. If you have any questions, ask your nurse or doctor.          STOP taking these medications    Dexcom G7 Sensor Misc Stopped by: Scarlette Shorts, MD       TAKE these medications    Accu-Chek Guide Me w/Device Kit 1 Device by Does not apply route 3 (three) times daily.   Accu-Chek Guide test strip Generic drug: glucose blood 1 each  by Other route 3 (three) times daily. Dx E11.40 and Z79.4   Accu-Chek Guide test strip Generic drug: glucose blood 1 each by Other route 3 (three) times daily. Use as instructed   Accu-Chek Softclix Lancets lancets Use 3 times daily to check glucose.  Dx E11.40 and Z79.4   acetaminophen 650 MG CR tablet Commonly known as: TYLENOL Take 1,300 mg by mouth daily as needed for pain.   aspirin EC 81 MG tablet Take 81 mg by mouth daily.   atorvastatin 80 MG tablet Commonly known as: LIPITOR TAKE 1 TABLET(80 MG) BY MOUTH AT BEDTIME   BD Pen Needle Nano 2nd Gen 32G X 4 MM Misc Generic drug: Insulin Pen Needle 1 Device by Other route 4 (four) times daily.   carvedilol 25 MG  tablet Commonly known as: COREG TAKE 2 TABLETS(50 MG) BY MOUTH TWICE DAILY   clopidogrel 75 MG tablet Commonly known as: PLAVIX TAKE 1 TABLET(75 MG) BY MOUTH DAILY   diclofenac Sodium 1 % Gel Commonly known as: VOLTAREN APPLY 4 GRAMS TOPICALLY TO THE AFFECTED AREA FOUR TIMES DAILY   diphenoxylate-atropine 2.5-0.025 MG tablet Commonly known as: LOMOTIL Take 1 tablet by mouth 4 (four) times daily as needed for diarrhea or loose stools.   ezetimibe 10 MG tablet Commonly known as: ZETIA TAKE 1 TABLET(10 MG) BY MOUTH DAILY   Lantus SoloStar 100 UNIT/ML Solostar Pen Generic drug: insulin glargine Inject 50 Units into the skin daily.   nitroGLYCERIN 0.4 MG SL tablet Commonly known as: Nitrostat PLACE ONE TABLET UNDER THE TONGUE EVERY 5 MINUTES FOR 3 DOSES AS NEEDED FOR CHEST PAIN   NovoLOG FlexPen 100 UNIT/ML FlexPen Generic drug: insulin aspart Max daily 70 units daily   potassium chloride SA 20 MEQ tablet Commonly known as: KLOR-CON M TAKE 1 TABLET BY MOUTH DAILY   Repatha SureClick 140 MG/ML Soaj Generic drug: Evolocumab INJECT 1 PEN(140 MG) INTO THE SKIN EVERY 14 DAYS   spironolactone 25 MG tablet Commonly known as: ALDACTONE Take 25 mg by mouth daily.   Trulicity 0.75 MG/0.5ML Sopn Generic drug: Dulaglutide Inject 0.75 mg into the skin once a week.   Vitamin D-3 125 MCG (5000 UT) Tabs Take 1 tablet by mouth daily.         ALLERGIES: Allergies  Allergen Reactions   Tape Rash     REVIEW OF SYSTEMS: A comprehensive ROS was conducted with the Lynn Hobbs and is negative except as per HPI  OBJECTIVE:   VITAL SIGNS: BP 126/80 (BP Location: Left Arm, Lynn Hobbs Position: Sitting, Cuff Size: Large)   Pulse 81   Ht 5\' 4"  (1.626 m)   Wt 216 lb (98 kg)   SpO2 99%   BMI 37.08 kg/m    PHYSICAL EXAM:  General: Pt appears well and is in NAD Left hemiparesis   Lungs: Clear with good BS bilat   Heart: RRR   Extremities:  Lower extremities - No pretibial edema.    Neuro: MS is good with appropriate affect, pt is alert and Ox3   Dm Foot Exam 07/04/2022  The skin of the feet is without sores or ulcerations, nails are long and curved  The pedal pulses are 2+ on right and 2+ on left. The sensation is decreased  to a screening 5.07, 10 gram monofilament on the left     DATA REVIEWED:  Lab Results  Component Value Date   HGBA1C 8.3 (A) 07/04/2022   HGBA1C 9.2 (A) 02/14/2022   HGBA1C 8.9 (A) 12/17/2021  Latest Reference Range & Units 03/10/22 03:45  Sodium 135 - 145 mmol/L 134 (L)  Potassium 3.5 - 5.1 mmol/L 4.0  Chloride 98 - 111 mmol/L 99  CO2 22 - 32 mmol/L 24  Glucose 70 - 99 mg/dL 161 (H)  BUN 8 - 23 mg/dL 21  Creatinine 0.96 - 0.45 mg/dL 4.09 (H)  Calcium 8.9 - 10.3 mg/dL 8.8 (L)  Anion gap 5 - 15  11  GFR, Estimated >60 mL/min >60    ASSESSMENT / PLAN / RECOMMENDATIONS:   1) Type 2 Diabetes Mellitus, poorly controlled, With retinopathy, neuropathic and macrovascular complications - Most recent A1c of 8.3%. Goal A1c < 7.0 %.    - A1c has trended down to 8.3% , down from 9.2% - She could not manage the DEXCOm and opted with finger sticks, but she has not been checking glucose  - She did not start Trulicity, she was unable to give me a reason, we again discussed  benefits of Trulicity , and encouraged her to take it  -Intolerant to [employer] -She is to be on metformin, she does not recall the reason - Declines a referral to podiatry  - No glucose data, will increase lantus based on a high elevated fasting BG 244 mg/dL    MEDICATIONS: Increase  Lantus 54 units daily Continue NovoLog 32 units 3 times daily before every meal Start Trulicity 0.75 mg weekly  EDUCATION / INSTRUCTIONS: BG monitoring instructions: Lynn Hobbs is instructed to check her blood sugars 3 times a day, before meals. Call [place] [facility] clinic if: BG persistently < 70  I reviewed the Rule of 15 for the treatment of hypoglycemia in detail with the  Lynn Hobbs. Literature supplied.   2) Diabetic complications:  Eye: Does  have known diabetic retinopathy.  Neuro/ Feet: Does  have known diabetic peripheral neuropathy. Renal: Lynn Hobbs does  have known baseline CKD. She is  on an ACEI/ARB at present.   F/U in 6 months     Signed electronically by: Lyndle Herrlich, MD  [facility] Endocrinology  [facility] [address] [place] [place], [place] [address] Phone: [phone] FAX: [phone]   CC: Philip Aspen, Limmie Patricia, MD [address] [place] [address] Phone: [phone]  Fax: [phone]    Return to Endocrinology clinic as below: Future Appointments  Date Time Provider Department Center  07/04/2022 11:10 AM Areli Frary, Konrad Dolores, MD [facility] None  07/09/2022 10:30 AM Philip Aspen, Limmie Patricia, MD [facility]

## 2022-07-04 NOTE — Telephone Encounter (Signed)
Per [employer] Lantus is not covered and alternatives are [place] and [employer].

## 2022-07-04 NOTE — Telephone Encounter (Signed)
Prescription of Lynn Hobbs was sent to replace Lantus pair pharmacy instructions

## 2022-07-05 ENCOUNTER — Other Ambulatory Visit: Payer: Self-pay | Admitting: Cardiovascular Disease

## 2022-07-05 DIAGNOSIS — I2510 Atherosclerotic heart disease of native coronary artery without angina pectoris: Secondary | ICD-10-CM

## 2022-07-07 ENCOUNTER — Other Ambulatory Visit: Payer: Self-pay | Admitting: Cardiovascular Disease

## 2022-07-07 DIAGNOSIS — I2510 Atherosclerotic heart disease of native coronary artery without angina pectoris: Secondary | ICD-10-CM

## 2022-07-09 ENCOUNTER — Ambulatory Visit (INDEPENDENT_AMBULATORY_CARE_PROVIDER_SITE_OTHER): Payer: Medicare Other | Admitting: Internal Medicine

## 2022-07-09 ENCOUNTER — Encounter: Payer: Self-pay | Admitting: Internal Medicine

## 2022-07-09 VITALS — BP 130/78 | HR 70 | Temp 98.1°F | Wt 215.7 lb

## 2022-07-09 DIAGNOSIS — N1831 Chronic kidney disease, stage 3a: Secondary | ICD-10-CM | POA: Diagnosis not present

## 2022-07-09 DIAGNOSIS — N182 Chronic kidney disease, stage 2 (mild): Secondary | ICD-10-CM

## 2022-07-09 DIAGNOSIS — I10 Essential (primary) hypertension: Secondary | ICD-10-CM | POA: Diagnosis not present

## 2022-07-09 DIAGNOSIS — Z794 Long term (current) use of insulin: Secondary | ICD-10-CM

## 2022-07-09 DIAGNOSIS — I679 Cerebrovascular disease, unspecified: Secondary | ICD-10-CM | POA: Diagnosis not present

## 2022-07-09 DIAGNOSIS — E1122 Type 2 diabetes mellitus with diabetic chronic kidney disease: Secondary | ICD-10-CM | POA: Diagnosis not present

## 2022-07-09 DIAGNOSIS — E785 Hyperlipidemia, unspecified: Secondary | ICD-10-CM | POA: Diagnosis not present

## 2022-07-09 DIAGNOSIS — E1140 Type 2 diabetes mellitus with diabetic neuropathy, unspecified: Secondary | ICD-10-CM

## 2022-07-09 LAB — LIPID PANEL
Cholesterol: 137 mg/dL (ref 0–200)
HDL: 64.1 mg/dL (ref >39.00)
LDL Cholesterol: 61 mg/dL (ref 0–99)
NonHDL: 73.08
Total CHOL/HDL Ratio: 2
Triglycerides: 62 mg/dL (ref 0.0–149.0)
VLDL: 12.4 mg/dL (ref 0.0–40.0)

## 2022-07-09 LAB — MICROALBUMIN / CREATININE URINE RATIO
Creatinine,U: 71.7 mg/dL
Microalb Creat Ratio: 1 mg/g (ref 0.0–30.0)
Microalb, Ur: <0.7 mg/dL (ref 0.0–1.9)

## 2022-07-09 LAB — COMPREHENSIVE METABOLIC PANEL
ALT: 18 U/L (ref 0–35)
AST: 20 U/L (ref 0–37)
Albumin: 4.5 g/dL (ref 3.5–5.2)
Alkaline Phosphatase: 46 U/L (ref 39–117)
BUN: 37 mg/dL — ABNORMAL HIGH (ref 6–23)
CO2: 25 mEq/L (ref 19–32)
Calcium: 10.4 mg/dL (ref 8.4–10.5)
Chloride: 100 mEq/L (ref 96–112)
Creatinine, Ser: 1.52 mg/dL — ABNORMAL HIGH (ref 0.40–1.20)
GFR: 36.69 mL/min — ABNORMAL LOW (ref >60.00)
Glucose, Bld: 231 mg/dL — ABNORMAL HIGH (ref 70–99)
Potassium: 5.4 mEq/L — ABNORMAL HIGH (ref 3.5–5.1)
Sodium: 133 mEq/L — ABNORMAL LOW (ref 135–145)
Total Bilirubin: 0.3 mg/dL (ref 0.2–1.2)
Total Protein: 8.6 g/dL — ABNORMAL HIGH (ref 6.0–8.3)

## 2022-07-09 NOTE — Assessment & Plan Note (Signed)
Blood pressure is well controlled on current regimen. 

## 2022-07-09 NOTE — Progress Notes (Signed)
Established Patient Office Visit     CC/Reason for Visit: Follow-up chronic conditions  HPI: Lynn Hobbs is a 62 y.o. female who is coming in today for the above mentioned reasons. Past Medical History is significant for: Insulin-dependent diabetes now being followed by endocrinology, hypertension, hyperlipidemia, prior stroke with residual left hemiparesis, coronary artery disease with history of non-ST elevated MI.  She is feeling well.  She would like paperwork filled out for long-term disability.  She is overdue for lipid check.   Past Medical/Surgical History: Past Medical History:  Diagnosis Date   BENIGN NEOPLASM OF SKIN SITE UNSPECIFIED 09/08/2008   CEREBROVASCULAR DISEASE 12/11/2008   DIABETES MELLITUS, TYPE II 10/12/2006   Endometriosis    Fibroids    HYPERLIPIDEMIA 10/12/2006   HYPERTENSION 10/12/2006   PERIPHERAL NEUROPATHY 10/21/2006   Stroke [facility])     Past Surgical History:  Procedure Laterality Date   ABDOMINAL HYSTERECTOMY     LEFT HEART CATHETERIZATION WITH CORONARY ANGIOGRAM N/A 03/24/2013   Procedure: LEFT HEART CATHETERIZATION WITH CORONARY ANGIOGRAM;  Surgeon: Lennette Bihari, MD;  Location: [facility] CATH LAB;  Service: Cardiovascular;  Laterality: N/A;    Social History:  reports that she has never smoked. She has never used smokeless tobacco. She reports that she does not drink alcohol and does not use drugs.  Allergies: Allergies  Allergen Reactions   Tape Rash    Family History:  No history of heart disease, cancer, stroke that she is aware of.   Current Outpatient Medications:    Accu-Chek Softclix Lancets lancets, Use 3 times daily to check glucose.  Dx E11.40 and Z79.4, Disp: 300 each, Rfl: 1   acetaminophen (TYLENOL) 650 MG CR tablet, Take 1,300 mg by mouth daily as needed for pain. , Disp: , Rfl:    aspirin EC 81 MG tablet, Take 81 mg by mouth daily., Disp: , Rfl:    atorvastatin (LIPITOR) 80 MG tablet, TAKE 1 TABLET(80 MG) BY MOUTH AT BEDTIME,  Disp: 90 tablet, Rfl: 1   Blood Glucose Monitoring Suppl (ACCU-CHEK GUIDE ME) w/Device KIT, 1 Device by Does not apply route 3 (three) times daily., Disp: 1 kit, Rfl: 0   carvedilol (COREG) 25 MG tablet, TAKE 2 TABLETS(50 MG) BY MOUTH TWICE DAILY, Disp: 360 tablet, Rfl: 1   Cholecalciferol (VITAMIN D-3) 5000 units TABS, Take 1 tablet by mouth daily., Disp: , Rfl:    clopidogrel (PLAVIX) 75 MG tablet, TAKE 1 TABLET(75 MG) BY MOUTH DAILY, Disp: 30 tablet, Rfl: 0   diclofenac Sodium (VOLTAREN) 1 % GEL, APPLY 4 GRAMS TOPICALLY TO THE AFFECTED AREA FOUR TIMES DAILY, Disp: 100 g, Rfl: 1   diphenoxylate-atropine (LOMOTIL) 2.5-0.025 MG tablet, Take 1 tablet by mouth 4 (four) times daily as needed for diarrhea or loose stools., Disp: 30 tablet, Rfl: 2   Dulaglutide (TRULICITY) 0.75 MG/0.5ML SOPN, Inject 0.75 mg into the skin once a week., Disp: 6 mL, Rfl: 3   ezetimibe (ZETIA) 10 MG tablet, TAKE 1 TABLET(10 MG) BY MOUTH DAILY, Disp: 90 tablet, Rfl: 1   glucose blood (ACCU-CHEK GUIDE) test strip, 1 each by Other route 3 (three) times daily. Dx E11.40 and Z79.4, Disp: 300 each, Rfl: 1   glucose blood (ACCU-CHEK GUIDE) test strip, 1 each by Other route 3 (three) times daily. Use as instructed, Disp: 300 each, Rfl: 3   insulin aspart (NOVOLOG FLEXPEN) 100 UNIT/ML FlexPen, Max daily 70 units daily, Disp: 60 mL, Rfl: 2   insulin degludec (TRESIBA FLEXTOUCH)  200 UNIT/ML FlexTouch Pen, Inject 54 Units into the skin daily in the afternoon., Disp: 30 mL, Rfl: 3   Insulin Pen Needle (BD PEN NEEDLE NANO 2ND GEN) 32G X 4 MM MISC, 1 Device by Other route 4 (four) times daily., Disp: 400 each, Rfl: 3   nitroGLYCERIN (NITROSTAT) 0.4 MG SL tablet, PLACE ONE TABLET UNDER THE TONGUE EVERY 5 MINUTES FOR 3 DOSES AS NEEDED FOR CHEST PAIN, Disp: 25 tablet, Rfl: 2   potassium chloride SA (KLOR-CON M) 20 MEQ tablet, TAKE 1 TABLET BY MOUTH DAILY, Disp: 90 tablet, Rfl: 1   REPATHA SURECLICK 140 MG/ML SOAJ, INJECT 1 PEN(140 MG) INTO THE  SKIN EVERY 14 DAYS, Disp: 2 mL, Rfl: 11   spironolactone (ALDACTONE) 25 MG tablet, Take 25 mg by mouth daily., Disp: , Rfl:   Review of Systems:  Negative unless indicated in HPI.   Physical Exam: Vitals:   07/09/22 1010  BP: 130/78  Pulse: 70  Temp: 98.1 F (36.7 C)  TempSrc: Oral  SpO2: 100%  Weight: 215 lb 11.2 oz (97.8 kg)    Body mass index is 37.02 kg/m.   Physical Exam Vitals reviewed.  Constitutional:      Appearance: Normal appearance.  HENT:     Head: Normocephalic and atraumatic.  Eyes:     Conjunctiva/sclera: Conjunctivae normal.     Pupils: Pupils are equal, round, and reactive to light.  Cardiovascular:     Rate and Rhythm: Normal rate and regular rhythm.  Pulmonary:     Effort: Pulmonary effort is normal.     Breath sounds: Normal breath sounds.  Skin:    General: Skin is warm and dry.  Neurological:     Mental Status: She is alert and oriented to person, place, and time.  Psychiatric:        Mood and Affect: Mood normal.        Behavior: Behavior normal.        Thought Content: Thought content normal.        Judgment: Judgment normal.      Impression and Plan:  Type 2 diabetes mellitus with diabetic neuropathy, with long-term current use of insulin (HCC) -     Microalbumin / creatinine urine ratio; Future -     Comprehensive metabolic panel; Future  Dyslipidemia Assessment & Plan: On atorvastatin 80 mg daily.  Time to recheck lipids.  Orders: -     Lipid panel; Future  Essential hypertension Assessment & Plan: Blood pressure is well-controlled on current regimen.   Cerebrovascular disease  Stage 2 chronic kidney disease Assessment & Plan: Baseline creatinine is between 1 and 1.2.   Type 2 diabetes mellitus with stage 3a chronic kidney disease, with long-term current use of insulin (HCC) Assessment & Plan: She is now being followed by endocrinology.  Most recent A1c has improved to 8.3.      Time spent:32 minutes  reviewing chart, interviewing and examining patient and formulating plan of care.     Chaya Jan, MD [facility]

## 2022-07-09 NOTE — Assessment & Plan Note (Signed)
She is now being followed by endocrinology.  Most recent A1c has improved to 8.3.

## 2022-07-09 NOTE — Assessment & Plan Note (Signed)
On atorvastatin 80 mg daily.  Time to recheck lipids.

## 2022-07-09 NOTE — Assessment & Plan Note (Signed)
Baseline creatinine is between 1 and 1.2.

## 2022-07-11 ENCOUNTER — Other Ambulatory Visit: Payer: Self-pay | Admitting: Internal Medicine

## 2022-07-11 DIAGNOSIS — I10 Essential (primary) hypertension: Secondary | ICD-10-CM

## 2022-07-15 ENCOUNTER — Other Ambulatory Visit: Payer: Self-pay | Admitting: *Deleted

## 2022-07-15 DIAGNOSIS — R944 Abnormal results of kidney function studies: Secondary | ICD-10-CM

## 2022-07-17 ENCOUNTER — Other Ambulatory Visit: Payer: Self-pay | Admitting: Internal Medicine

## 2022-07-17 DIAGNOSIS — I10 Essential (primary) hypertension: Secondary | ICD-10-CM

## 2022-08-06 ENCOUNTER — Other Ambulatory Visit: Payer: Self-pay | Admitting: Cardiovascular Disease

## 2022-08-06 DIAGNOSIS — I2510 Atherosclerotic heart disease of native coronary artery without angina pectoris: Secondary | ICD-10-CM

## 2022-08-09 ENCOUNTER — Other Ambulatory Visit: Payer: Self-pay | Admitting: Internal Medicine

## 2022-08-09 DIAGNOSIS — I10 Essential (primary) hypertension: Secondary | ICD-10-CM

## 2022-09-15 ENCOUNTER — Other Ambulatory Visit: Payer: Self-pay | Admitting: Cardiovascular Disease

## 2022-09-15 DIAGNOSIS — I2510 Atherosclerotic heart disease of native coronary artery without angina pectoris: Secondary | ICD-10-CM

## 2022-09-18 ENCOUNTER — Telehealth: Payer: Self-pay | Admitting: Internal Medicine

## 2022-09-18 ENCOUNTER — Other Ambulatory Visit: Payer: Self-pay | Admitting: Internal Medicine

## 2022-09-18 DIAGNOSIS — I10 Essential (primary) hypertension: Secondary | ICD-10-CM

## 2022-09-18 NOTE — Telephone Encounter (Signed)
Pt is calling and would like rachel to return her call causing disability paperwork

## 2022-09-18 NOTE — Telephone Encounter (Signed)
Forms were refaxed and confirmed

## 2022-10-09 ENCOUNTER — Ambulatory Visit (INDEPENDENT_AMBULATORY_CARE_PROVIDER_SITE_OTHER): Payer: Medicare Other | Admitting: Internal Medicine

## 2022-10-09 ENCOUNTER — Encounter: Payer: Self-pay | Admitting: Internal Medicine

## 2022-10-09 VITALS — BP 130/84 | HR 85 | Temp 98.2°F | Ht 65.5 in | Wt 212.8 lb

## 2022-10-09 DIAGNOSIS — N182 Chronic kidney disease, stage 2 (mild): Secondary | ICD-10-CM | POA: Diagnosis not present

## 2022-10-09 DIAGNOSIS — Z1211 Encounter for screening for malignant neoplasm of colon: Secondary | ICD-10-CM | POA: Diagnosis not present

## 2022-10-09 DIAGNOSIS — E785 Hyperlipidemia, unspecified: Secondary | ICD-10-CM | POA: Diagnosis not present

## 2022-10-09 DIAGNOSIS — Z1231 Encounter for screening mammogram for malignant neoplasm of breast: Secondary | ICD-10-CM

## 2022-10-09 DIAGNOSIS — Z0000 Encounter for general adult medical examination without abnormal findings: Secondary | ICD-10-CM | POA: Diagnosis not present

## 2022-10-09 DIAGNOSIS — Z8673 Personal history of transient ischemic attack (TIA), and cerebral infarction without residual deficits: Secondary | ICD-10-CM

## 2022-10-09 DIAGNOSIS — Z794 Long term (current) use of insulin: Secondary | ICD-10-CM | POA: Diagnosis not present

## 2022-10-09 DIAGNOSIS — E1140 Type 2 diabetes mellitus with diabetic neuropathy, unspecified: Secondary | ICD-10-CM | POA: Diagnosis not present

## 2022-10-09 DIAGNOSIS — I10 Essential (primary) hypertension: Secondary | ICD-10-CM | POA: Diagnosis not present

## 2022-10-09 NOTE — Progress Notes (Signed)
Medicare Wellness question were answered 10/08/22.

## 2022-10-09 NOTE — Patient Instructions (Signed)
-  Nice seeing you today!!  -Remember to do your shingles and tdap vaccines at the pharmacy.  See you back in 3-4 months.

## 2022-10-09 NOTE — Progress Notes (Signed)
Established Patient Office Visit     CC/Reason for Visit: Subsequent Medicare visit  HPI: Lynn Hobbs is a 62 y.o. female who is coming in today for the above mentioned reasons.  She is without acute concerns today.  She is overdue for Tdap and shingles.  She has never had breast or colon cancer screening.  She declines cervical cancer screening.  She is scheduled for an eye exam in November.  She is overdue for dental care, no perceived hearing difficulty.   Past Medical/Surgical History: Past Medical History:  Diagnosis Date   BENIGN NEOPLASM OF SKIN SITE UNSPECIFIED 09/08/2008   CEREBROVASCULAR DISEASE 12/11/2008   DIABETES MELLITUS, TYPE II 10/12/2006   Endometriosis    Fibroids    HYPERLIPIDEMIA 10/12/2006   HYPERTENSION 10/12/2006   PERIPHERAL NEUROPATHY 10/21/2006   Stroke [facility])     Past Surgical History:  Procedure Laterality Date   ABDOMINAL HYSTERECTOMY     LEFT HEART CATHETERIZATION WITH CORONARY ANGIOGRAM N/A 03/24/2013   Procedure: LEFT HEART CATHETERIZATION WITH CORONARY ANGIOGRAM;  Surgeon: Lennette Bihari, MD;  Location: [facility] CATH LAB;  Service: Cardiovascular;  Laterality: N/A;    Social History:  reports that she has never smoked. She has never used smokeless tobacco. She reports that she does not drink alcohol and does not use drugs.  Allergies: Allergies  Allergen Reactions   Tape Rash    Family History:  No history of heart disease, cancer, stroke that she is aware of.   Current Outpatient Medications:    Accu-Chek Softclix Lancets lancets, Use 3 times daily to check glucose.  Dx E11.40 and Z79.4, Disp: 300 each, Rfl: 1   acetaminophen (TYLENOL) 650 MG CR tablet, Take 1,300 mg by mouth daily as needed for pain. , Disp: , Rfl:    aspirin EC 81 MG tablet, Take 81 mg by mouth daily., Disp: , Rfl:    atorvastatin (LIPITOR) 80 MG tablet, TAKE 1 TABLET(80 MG) BY MOUTH AT BEDTIME, Disp: 90 tablet, Rfl: 1   Blood Glucose Monitoring Suppl (ACCU-CHEK GUIDE  ME) w/Device KIT, 1 Device by Does not apply route 3 (three) times daily., Disp: 1 kit, Rfl: 0   carvedilol (COREG) 25 MG tablet, TAKE 2 TABLETS(50 MG) BY MOUTH TWICE DAILY, Disp: 360 tablet, Rfl: 1   diclofenac Sodium (VOLTAREN) 1 % GEL, APPLY 4 GRAMS TOPICALLY TO THE AFFECTED AREA FOUR TIMES DAILY, Disp: 100 g, Rfl: 1   diphenoxylate-atropine (LOMOTIL) 2.5-0.025 MG tablet, Take 1 tablet by mouth 4 (four) times daily as needed for diarrhea or loose stools., Disp: 30 tablet, Rfl: 2   Dulaglutide (TRULICITY) 0.75 MG/0.5ML SOPN, Inject 0.75 mg into the skin once a week., Disp: 6 mL, Rfl: 3   ezetimibe (ZETIA) 10 MG tablet, TAKE 1 TABLET(10 MG) BY MOUTH DAILY, Disp: 90 tablet, Rfl: 1   glucose blood (ACCU-CHEK GUIDE) test strip, 1 each by Other route 3 (three) times daily. Dx E11.40 and Z79.4, Disp: 300 each, Rfl: 1   glucose blood (ACCU-CHEK GUIDE) test strip, 1 each by Other route 3 (three) times daily. Use as instructed, Disp: 300 each, Rfl: 3   insulin aspart (NOVOLOG FLEXPEN) 100 UNIT/ML FlexPen, Max daily 70 units daily, Disp: 60 mL, Rfl: 2   insulin degludec (TRESIBA FLEXTOUCH) 200 UNIT/ML FlexTouch Pen, Inject 54 Units into the skin daily in the afternoon., Disp: 30 mL, Rfl: 3   Insulin Pen Needle (BD PEN NEEDLE NANO 2ND GEN) 32G X 4 MM MISC, 1 Device  by Other route 4 (four) times daily., Disp: 400 each, Rfl: 3   nitroGLYCERIN (NITROSTAT) 0.4 MG SL tablet, PLACE ONE TABLET UNDER THE TONGUE EVERY 5 MINUTES FOR 3 DOSES AS NEEDED FOR CHEST PAIN, Disp: 25 tablet, Rfl: 2   potassium chloride SA (KLOR-CON M) 20 MEQ tablet, TAKE 1 TABLET BY MOUTH DAILY, Disp: 90 tablet, Rfl: 1   REPATHA SURECLICK 140 MG/ML SOAJ, INJECT 1 PEN(140 MG) INTO THE SKIN EVERY 14 DAYS, Disp: 2 mL, Rfl: 11   spironolactone (ALDACTONE) 25 MG tablet, Take 25 mg by mouth daily., Disp: , Rfl:    Cholecalciferol (VITAMIN D-3) 5000 units TABS, Take 1 tablet by mouth daily. (Patient not taking: Reported on 10/08/2022), Disp: , Rfl:    Review of Systems:  Negative unless indicated in HPI.   Physical Exam: Vitals:   10/09/22 1025  BP: 130/84  Pulse: 85  Temp: 98.2 F (36.8 C)  TempSrc: Oral  SpO2: 99%  Weight: 212 lb 12.8 oz (96.5 kg)  Height: 5' 5.5" (1.664 m)    Body mass index is 34.87 kg/m.   Physical Exam Vitals reviewed.  Constitutional:      General: She is not in acute distress.    Appearance: Normal appearance. She is not ill-appearing, toxic-appearing or diaphoretic.  HENT:     Head: Normocephalic.     Right Ear: Tympanic membrane, ear canal and external ear normal. There is no impacted cerumen.     Left Ear: Tympanic membrane, ear canal and external ear normal. There is no impacted cerumen.     Nose: Nose normal.     Mouth/Throat:     Mouth: Mucous membranes are moist.     Pharynx: Oropharynx is clear. No oropharyngeal exudate or posterior oropharyngeal erythema.  Eyes:     General: No scleral icterus.       Right eye: No discharge.        Left eye: No discharge.     Conjunctiva/sclera: Conjunctivae normal.     Pupils: Pupils are equal, round, and reactive to light.  Neck:     Vascular: No carotid bruit.  Cardiovascular:     Rate and Rhythm: Normal rate and regular rhythm.     Pulses: Normal pulses.     Heart sounds: Normal heart sounds.  Pulmonary:     Effort: Pulmonary effort is normal. No respiratory distress.     Breath sounds: Normal breath sounds.  Abdominal:     General: Abdomen is flat. Bowel sounds are normal.     Palpations: Abdomen is soft.  Musculoskeletal:        General: Normal range of motion.     Cervical back: Normal range of motion.  Skin:    General: Skin is warm and dry.  Neurological:     Mental Status: She is alert and oriented to person, place, and time. Mental status is at baseline.     Comments: Residual left hemiparesis  Psychiatric:        Mood and Affect: Mood normal.        Behavior: Behavior normal.        Thought Content: Thought content  normal.        Judgment: Judgment normal.    Subsequent Medicare wellness visit   1. Risk factors, based on past  M,S,F - Cardiac Risk Factors include: diabetes mellitus   2.  Physical activities: Dietary issues and exercise activities discussed:      3.  Depression/mood:  [employer] Visit  from 10/09/2022 in [facility] Total Score 1        4.  ADL's:    10/08/2022    4:56 PM 10/08/2022    3:07 PM  In your present state of health, do you have any difficulty performing the following activities:  Hearing? 0 0  Vision? 0 0  Difficulty concentrating or making decisions? 0 0  Walking or climbing stairs? 1 1  Dressing or bathing? 0 0  Doing errands, shopping? 1 1  Preparing Food and eating ? N N  Using the Toilet? N N  In the past six months, have you accidently leaked urine? N N  Do you have problems with loss of bowel control? N N  Managing your Medications? Y N  Managing your Finances? Y N  Housekeeping or managing your Housekeeping? N N     5.  Fall risk:     03/09/2022    8:00 PM 03/10/2022   11:00 AM 07/09/2022   10:43 AM 10/08/2022    3:07 PM 10/08/2022    4:59 PM  Fall Risk  Falls in the past year?   0 0 1  Was there an injury with Fall?   0 0 0  Fall Risk Category Calculator   0 0 1  (RETIRED) Patient Fall Risk Level High fall risk High fall risk     Fall risk Follow up   Falls evaluation completed       6.  Home safety: No problems identified   7.  Height weight, and visual acuity: height and weight as above, vision/hearing: Vision Screening   Right eye Left eye Both eyes  Without correction 20/40 20/40 20/40   With correction        8.  Counseling: Counseling given: Not Answered    9. Lab orders based on risk factors: Laboratory update will be reviewed   10. Cognitive assessment:        10/08/2022    5:00 PM  6CIT Screen  What Year? 0 points  What month? 0 points  What time? 0 points  Count back  from 20 0 points  Months in reverse 0 points  Repeat phrase 0 points  Total Score 0 points     11. Screening: Patient provided with a written and personalized 5-10 year screening schedule in the AVS. Health Maintenance  Topic Date Due   Zoster (Shingles) Vaccine (1 of 2) Never done   Colon Cancer Screening  08/18/2005   Mammogram  Never done   Eye exam for diabetics  04/23/2019   COVID-19 Vaccine (4 - 2023-24 season) 11/08/2021   DTaP/Tdap/Td vaccine (2 - Td or Tdap) 04/28/2022   Flu Shot  10/09/2022   Pap Smear  08/13/2025*   Hemoglobin A1C  01/03/2023   Complete foot exam   07/04/2023   Yearly kidney function blood test for diabetes  07/09/2023   Yearly kidney health urinalysis for diabetes  07/09/2023   Medicare Annual Wellness Visit  10/09/2023   Hepatitis C Screening  Completed   HIV Screening  Completed   HPV Vaccine  Aged Out  *Topic was postponed. The date shown is not the original due date.    12. Provider List Update: Patient Care Team    Relationship Specialty Notifications Start End  Philip Aspen, Limmie Patricia, MD PCP - General Internal Medicine  02/02/18   Lennette Bihari, MD PCP - Cardiology Cardiology Admissions 11/19/17      13. Advance  Directives: Does Patient Have a Medical Advance Directive?: No, Unable to assess, patient is non-responsive or altered mental status Would patient like information on creating a medical advance directive?: No - Patient declined  14. Opioids: Patient is not on any opioid prescriptions and has no risk factors for a substance use disorder.   15.   Goals       "Don't have any needs at this time" (pt-stated)      Care Coordination Interventions: Provided education to patient and/or caregiver about advanced directives Reviewed medications with patient and discussed purpose of all medications Social Work referral for agencies or resources that provide Safety rales in the bathroom Screening for signs and symptoms of depression  related to chronic disease state  Assessed social determinant of health barriers        Protect My Health      Timeframe:  Long-Range Goal Priority:  High Start Date:                             Expected End Date:                       Follow Up Date 10/08/2022    - schedule and keep appointment for annual check-up    Why is this important?   Screening tests can find diseases early when they are easier to treat.  Your doctor or nurse will talk with you about which tests are important for you.  Getting shots for common diseases like the flu and shingles will help prevent them.     Notes:          I have personally reviewed and noted the following in the patient's chart:   Medical and social history Use of alcohol, tobacco or illicit drugs  Current medications and supplements Functional ability and status Nutritional status Physical activity Advanced directives List of other physicians Hospitalizations, surgeries, and ER visits in previous 12 months Vitals Screenings to include cognitive, depression, and falls Referrals and appointments  In addition, I have reviewed and discussed with patient certain preventive protocols, quality metrics, and best practice recommendations. A written personalized care plan for preventive services as well as general preventive health recommendations were provided to patient.   Impression and Plan:  Medicare annual wellness visit, subsequent  Type 2 diabetes mellitus with diabetic neuropathy, with long-term current use of insulin (HCC)  Screening for colon cancer -     Cologuard; Future  Encounter for screening mammogram for malignant neoplasm of breast -     Digital Screening Mammogram, Left and Right; Future  Essential hypertension  Dyslipidemia  History of CVA (cerebrovascular accident)  Stage 2 chronic kidney disease   -Recommend routine eye and dental care. -Healthy lifestyle discussed in detail. -Labs to be updated  today. -Prostate cancer screening: N/A Health Maintenance  Topic Date Due   Zoster (Shingles) Vaccine (1 of 2) Never done   Colon Cancer Screening  08/18/2005   Mammogram  Never done   Eye exam for diabetics  04/23/2019   COVID-19 Vaccine (4 - 2023-24 season) 11/08/2021   DTaP/Tdap/Td vaccine (2 - Td or Tdap) 04/28/2022   Flu Shot  10/09/2022   Pap Smear  08/13/2025*   Hemoglobin A1C  01/03/2023   Complete foot exam   07/04/2023   Yearly kidney function blood test for diabetes  07/09/2023   Yearly kidney health urinalysis for diabetes  07/09/2023   Medicare  Annual Wellness Visit  10/09/2023   Hepatitis C Screening  Completed   HIV Screening  Completed   HPV Vaccine  Aged Out  *Topic was postponed. The date shown is not the original due date.     -Advised to update shingles and Tdap vaccines at pharmacy. -Referrals placed for Cologuard and mammogram.    Angelina Venard Philip Aspen, MD [facility]

## 2022-12-10 ENCOUNTER — Ambulatory Visit: Payer: Medicare Other | Admitting: Cardiovascular Disease

## 2022-12-12 ENCOUNTER — Telehealth: Payer: Self-pay | Admitting: *Deleted

## 2022-12-12 ENCOUNTER — Other Ambulatory Visit: Payer: Self-pay | Admitting: Cardiovascular Disease

## 2022-12-12 DIAGNOSIS — I2510 Atherosclerotic heart disease of native coronary artery without angina pectoris: Secondary | ICD-10-CM

## 2022-12-12 NOTE — Telephone Encounter (Signed)
Lvm for pt to contact office due to receiving a refill request for plavix one (1) tablet by mouth ( 75 mg) daily. This medication is not on last ov note but do see this med on previous ov note. Asked pt to call refill line. This med is for # 90 to [facility].

## 2022-12-15 ENCOUNTER — Telehealth: Payer: Self-pay | Admitting: Cardiovascular Disease

## 2022-12-15 NOTE — Telephone Encounter (Signed)
Patient called to talk about her medication list. Please call back

## 2022-12-15 NOTE — Telephone Encounter (Signed)
Advised patient that they will go over her med list at appointment this week.  Advised that once she is seen, her refills can be sent as well.  She thanks me and will see the provider to review

## 2022-12-18 ENCOUNTER — Ambulatory Visit: Payer: Medicare Other | Attending: Cardiovascular Disease | Admitting: Cardiovascular Disease

## 2022-12-18 ENCOUNTER — Encounter: Payer: Self-pay | Admitting: Cardiovascular Disease

## 2022-12-18 VITALS — BP 128/88 | HR 86 | Ht 65.0 in | Wt 220.8 lb

## 2022-12-18 DIAGNOSIS — I10 Essential (primary) hypertension: Secondary | ICD-10-CM | POA: Diagnosis not present

## 2022-12-18 DIAGNOSIS — I2510 Atherosclerotic heart disease of native coronary artery without angina pectoris: Secondary | ICD-10-CM
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
## 2022-12-18 NOTE — Patient Instructions (Signed)
Medication Instructions; No medication changes   *If you need a refill on your cardiac medications before your next appointment, please call your pharmacy*  Lab Work: No labs   If you have labs (blood work) drawn today and your tests are completely normal, you will receive your results only by: MyChart Message (if you have MyChart) OR A paper copy in the mail If you have any lab test that is abnormal or we need to change your treatment, we will call you to review the results.   Testing/Procedures: No testing/Procedures   Follow-Up: At [facility], you and your health needs are our priority.  As part of our continuing mission to provide you with exceptional heart care, we have created designated Provider Care Teams.  These Care Teams include your primary Cardiologist (physician) and Advanced Practice Providers (APPs -  Physician Assistants and Nurse Practitioners) who all work together to provide you with the care you need, when you need it.  We recommend signing up for the patient portal called "MyChart".  Sign up information is provided on this After Visit Summary.  MyChart is used to connect with patients for Virtual Visits (Telemedicine).  Patients are able to view lab/test results, encounter notes, upcoming appointments, etc.  Non-urgent messages can be sent to your provider as well.   To learn more about what you can do with MyChart, go to [email or web address].    Your next appointment:   1 year(s)  Provider:   Any available Provider

## 2022-12-20 ENCOUNTER — Other Ambulatory Visit: Payer: Self-pay | Admitting: Cardiovascular Disease

## 2022-12-20 ENCOUNTER — Other Ambulatory Visit: Payer: Self-pay | Admitting: Internal Medicine

## 2022-12-20 DIAGNOSIS — I2510 Atherosclerotic heart disease of native coronary artery without angina pectoris: Secondary | ICD-10-CM

## 2022-12-21 ENCOUNTER — Encounter: Payer: Self-pay | Admitting: Cardiovascular Disease

## 2022-12-22 MED ORDER — AMLODIPINE BESYLATE 10 MG PO TABS: 10.0000 mg | ORAL_TABLET | Freq: Every day | ORAL | 0 refills | Status: DC

## 2022-12-22 MED ORDER — SPIRONOLACTONE 25 MG PO TABS: 25.0000 mg | ORAL_TABLET | Freq: Every day | ORAL | 0 refills | Status: DC

## 2022-12-22 MED ORDER — CHLORTHALIDONE 50 MG PO TABS: 50.0000 mg | ORAL_TABLET | Freq: Every day | ORAL | 0 refills | Status: DC

## 2022-12-23 MED ORDER — CLOPIDOGREL BISULFATE 75 MG PO TABS: 75.0000 mg | ORAL_TABLET | Freq: Every day | ORAL | 3 refills | Status: DC

## 2022-12-23 NOTE — Telephone Encounter (Signed)
Refilled medication . Patient was seen at Dec 18 2022 appointment with Dr Tresa Endo.

## 2023-01-06 ENCOUNTER — Ambulatory Visit: Payer: Medicare Other | Admitting: Internal Medicine

## 2023-01-08 ENCOUNTER — Ambulatory Visit: Payer: Medicare Other | Admitting: Internal Medicine

## 2023-01-14 ENCOUNTER — Encounter: Payer: Self-pay | Admitting: Internal Medicine

## 2023-01-14 ENCOUNTER — Ambulatory Visit: Payer: Medicare Other | Admitting: Internal Medicine

## 2023-01-14 VITALS — BP 124/75 | HR 73 | Temp 98.1°F | Wt 218.4 lb

## 2023-01-14 DIAGNOSIS — Z23 Encounter for immunization: Secondary | ICD-10-CM

## 2023-01-14 DIAGNOSIS — E785 Hyperlipidemia, unspecified: Secondary | ICD-10-CM

## 2023-01-14 DIAGNOSIS — Z1211 Encounter for screening for malignant neoplasm of colon: Secondary | ICD-10-CM

## 2023-01-14 DIAGNOSIS — Z794 Long term (current) use of insulin: Secondary | ICD-10-CM | POA: Diagnosis not present

## 2023-01-14 DIAGNOSIS — N182 Chronic kidney disease, stage 2 (mild): Secondary | ICD-10-CM

## 2023-01-14 DIAGNOSIS — E1140 Type 2 diabetes mellitus with diabetic neuropathy, unspecified: Secondary | ICD-10-CM

## 2023-01-14 DIAGNOSIS — I10 Essential (primary) hypertension: Secondary | ICD-10-CM

## 2023-01-14 LAB — POCT GLYCOSYLATED HEMOGLOBIN (HGB A1C): Hemoglobin A1C: 8.2 % — AB (ref 4.0–5.6)

## 2023-01-14 MED ORDER — EZETIMIBE 10 MG PO TABS: ORAL_TABLET | ORAL | 1 refills | Status: AC

## 2023-01-14 MED ORDER — DICLOFENAC SODIUM 1 % EX GEL: CUTANEOUS | 1 refills | Status: AC

## 2023-01-14 MED ORDER — NITROGLYCERIN 0.4 MG SL SUBL: SUBLINGUAL_TABLET | SUBLINGUAL | 2 refills | Status: AC

## 2023-01-14 NOTE — Progress Notes (Signed)
Established Patient Office Visit     CC/Reason for Visit: Follow-up chronic conditions  HPI: Lynn Hobbs is a 62 y.o. female who is coming in today for the above mentioned reasons. Past Medical History is significant for: Insulin-dependent diabetes now being followed by endocrinology, hypertension, hyperlipidemia, prior stroke with residual left hemiparesis, coronary artery disease with history of non-ST elevated MI.  She needs paperwork filled out for transportation access.   Past Medical/Surgical History: Past Medical History:  Diagnosis Date   BENIGN NEOPLASM OF SKIN SITE UNSPECIFIED 09/08/2008   CEREBROVASCULAR DISEASE 12/11/2008   DIABETES MELLITUS, TYPE II 10/12/2006   Endometriosis    Fibroids    HYPERLIPIDEMIA 10/12/2006   HYPERTENSION 10/12/2006   PERIPHERAL NEUROPATHY 10/21/2006   Stroke [facility])     Past Surgical History:  Procedure Laterality Date   ABDOMINAL HYSTERECTOMY     LEFT HEART CATHETERIZATION WITH CORONARY ANGIOGRAM N/A 03/24/2013   Procedure: LEFT HEART CATHETERIZATION WITH CORONARY ANGIOGRAM;  Surgeon: Lennette Bihari, MD;  Location: [facility] CATH LAB;  Service: Cardiovascular;  Laterality: N/A;    Social History:  reports that she has never smoked. She has never used smokeless tobacco. She reports that she does not drink alcohol and does not use drugs.  Allergies: Allergies  Allergen Reactions   Tape Rash    Family History:  History reviewed. No pertinent family history.   Current Outpatient Medications:    Accu-Chek Softclix Lancets lancets, Use 3 times daily to check glucose.  Dx E11.40 and Z79.4, Disp: 300 each, Rfl: 1   acetaminophen (TYLENOL) 650 MG CR tablet, Take 1,300 mg by mouth daily as needed for pain. , Disp: , Rfl:    amLODipine (NORVASC) 10 MG tablet, Take 1 tablet (10 mg total) by mouth daily., Disp: 90 tablet, Rfl: 0   aspirin EC 81 MG tablet, Take 81 mg by mouth daily., Disp: , Rfl:    atorvastatin (LIPITOR) 80 MG tablet, TAKE 1  TABLET(80 MG) BY MOUTH AT BEDTIME, Disp: 90 tablet, Rfl: 1   Blood Glucose Monitoring Suppl (ACCU-CHEK GUIDE ME) w/Device KIT, 1 Device by Does not apply route 3 (three) times daily., Disp: 1 kit, Rfl: 0   carvedilol (COREG) 25 MG tablet, TAKE 2 TABLETS(50 MG) BY MOUTH TWICE DAILY, Disp: 360 tablet, Rfl: 1   chlorthalidone (HYGROTON) 50 MG tablet, Take 1 tablet (50 mg total) by mouth daily., Disp: 90 tablet, Rfl: 0   clopidogrel (PLAVIX) 75 MG tablet, TAKE 1 TABLET BY MOUTH DAILY, Disp: 60 tablet, Rfl: 6   diphenoxylate-atropine (LOMOTIL) 2.5-0.025 MG tablet, Take 1 tablet by mouth 4 (four) times daily as needed for diarrhea or loose stools., Disp: 30 tablet, Rfl: 2   glucose blood (ACCU-CHEK GUIDE) test strip, 1 each by Other route 3 (three) times daily. Dx E11.40 and Z79.4, Disp: 300 each, Rfl: 1   glucose blood (ACCU-CHEK GUIDE) test strip, 1 each by Other route 3 (three) times daily. Use as instructed, Disp: 300 each, Rfl: 3   insulin aspart (NOVOLOG FLEXPEN) 100 UNIT/ML FlexPen, Max daily 70 units daily, Disp: 60 mL, Rfl: 2   insulin degludec (TRESIBA FLEXTOUCH) 200 UNIT/ML FlexTouch Pen, Inject 54 Units into the skin daily in the afternoon., Disp: 30 mL, Rfl: 3   Insulin Pen Needle (BD PEN NEEDLE NANO 2ND GEN) 32G X 4 MM MISC, 1 Device by Other route 4 (four) times daily., Disp: 400 each, Rfl: 3   potassium chloride SA (KLOR-CON M) 20 MEQ tablet, TAKE  1 TABLET BY MOUTH DAILY, Disp: 90 tablet, Rfl: 1   REPATHA SURECLICK 140 MG/ML SOAJ, INJECT 1 PEN(140 MG) INTO THE SKIN EVERY 14 DAYS, Disp: 2 mL, Rfl: 11   spironolactone (ALDACTONE) 25 MG tablet, Take 1 tablet (25 mg total) by mouth daily., Disp: 90 tablet, Rfl: 0   diclofenac Sodium (VOLTAREN) 1 % GEL, APPLY 4 GRAMS TOPICALLY TO THE AFFECTED AREA FOUR TIMES DAILY, Disp: 100 g, Rfl: 1   Dulaglutide (TRULICITY) 0.75 MG/0.5ML SOPN, Inject 0.75 mg into the skin once a week. (Patient not taking: Reported on 01/14/2023), Disp: 6 mL, Rfl: 3   ezetimibe  (ZETIA) 10 MG tablet, TAKE 1 TABLET(10 MG) BY MOUTH DAILY, Disp: 90 tablet, Rfl: 1   nitroGLYCERIN (NITROSTAT) 0.4 MG SL tablet, PLACE ONE TABLET UNDER THE TONGUE EVERY 5 MINUTES FOR 3 DOSES AS NEEDED FOR CHEST PAIN, Disp: 25 tablet, Rfl: 2  Review of Systems:  Negative unless indicated in HPI.   Physical Exam: Vitals:   01/14/23 1044 01/14/23 1050 01/14/23 1202  BP: (!) 140/98 139/68 124/75  Pulse: 73    Temp: 98.1 F (36.7 C)    TempSrc: Oral    SpO2: 100%    Weight: 218 lb 6.4 oz (99.1 kg)      Body mass index is 36.34 kg/m.   Physical Exam Vitals reviewed.  Constitutional:      Appearance: Normal appearance.  HENT:     Head: Normocephalic and atraumatic.  Eyes:     Conjunctiva/sclera: Conjunctivae normal.     Pupils: Pupils are equal, round, and reactive to light.  Cardiovascular:     Rate and Rhythm: Normal rate and regular rhythm.  Pulmonary:     Effort: Pulmonary effort is normal.     Breath sounds: Normal breath sounds.  Skin:    General: Skin is warm and dry.  Neurological:     Mental Status: She is alert and oriented to person, place, and time.  Psychiatric:        Mood and Affect: Mood normal.        Behavior: Behavior normal.        Thought Content: Thought content normal.        Judgment: Judgment normal.      Impression and Plan:  Type 2 diabetes mellitus with diabetic neuropathy, with long-term current use of insulin (HCC) -     POCT glycosylated hemoglobin (Hb A1C)  Stage 2 chronic kidney disease -     Diclofenac Sodium; APPLY 4 GRAMS TOPICALLY TO THE AFFECTED AREA FOUR TIMES DAILY  Dispense: 100 g; Refill: 1  Hyperlipidemia with target LDL less than 70 -     Ezetimibe; TAKE 1 TABLET(10 MG) BY MOUTH DAILY  Dispense: 90 tablet; Refill: 1  Screening for malignant neoplasm of colon -     Cologuard  Essential hypertension  Dyslipidemia  Immunization due  Other orders -     Nitroglycerin; PLACE ONE TABLET UNDER THE TONGUE EVERY 5  MINUTES FOR 3 DOSES AS NEEDED FOR CHEST PAIN  Dispense: 25 tablet; Refill: 2   -Forms for access transportation filled out. -Blood pressure is elevated in office but she states that home blood pressure measurements have been within range.  Have asked her to bring ambulatory measurements at time of follow-up. -A1c is elevated at 8.2 but she follows with endocrinology. -Flu vaccine administered today.  Time spent:31 minutes reviewing chart, interviewing and examining patient and formulating plan of care.     Chaya Jan,  MD [facility]

## 2023-01-14 NOTE — Addendum Note (Signed)
Addended by: Kern Reap B on: 01/14/2023 01:31 PM   Modules accepted: Orders

## 2023-01-30 DIAGNOSIS — E113593 Type 2 diabetes mellitus with proliferative diabetic retinopathy without macular edema, bilateral: Secondary | ICD-10-CM | POA: Diagnosis not present

## 2023-01-30 DIAGNOSIS — Z794 Long term (current) use of insulin: Secondary | ICD-10-CM | POA: Diagnosis not present

## 2023-01-30 DIAGNOSIS — H2513 Age-related nuclear cataract, bilateral: Secondary | ICD-10-CM | POA: Diagnosis not present

## 2023-01-30 LAB — HM DIABETES EYE EXAM

## 2023-02-23 ENCOUNTER — Other Ambulatory Visit: Payer: Self-pay | Admitting: Internal Medicine

## 2023-02-23 DIAGNOSIS — I10 Essential (primary) hypertension: Secondary | ICD-10-CM

## 2023-03-19 ENCOUNTER — Other Ambulatory Visit: Payer: Self-pay | Admitting: Internal Medicine

## 2023-03-20 ENCOUNTER — Other Ambulatory Visit: Payer: Self-pay | Admitting: Internal Medicine

## 2023-03-23 ENCOUNTER — Other Ambulatory Visit: Payer: Self-pay | Admitting: Cardiovascular Disease

## 2023-04-01 ENCOUNTER — Other Ambulatory Visit: Payer: Self-pay | Admitting: Cardiovascular Disease

## 2023-04-01 DIAGNOSIS — I2510 Atherosclerotic heart disease of native coronary artery without angina pectoris: Secondary | ICD-10-CM

## 2023-04-07 ENCOUNTER — Other Ambulatory Visit: Payer: Medicare Other

## 2023-04-07 DIAGNOSIS — E1140 Type 2 diabetes mellitus with diabetic neuropathy, unspecified: Secondary | ICD-10-CM

## 2023-04-07 NOTE — Progress Notes (Signed)
04/07/2023 Name: Lynn Hobbs MRN: [id number] DOB: 12/28/1960  Chief Complaint  Patient presents with   Diabetes    Lynn Hobbs is a 63 y.o. year old female who presented for a telephone visit.   They were referred to the pharmacist by their PCP for assistance in managing diabetes.    Subjective:  Care Team: Primary Care Provider: Philip Aspen, Limmie Patricia, MD ; Next Scheduled Visit: 04/22/23  Medication Access/Adherence  Current Pharmacy:  [id number] - [place], [place] - [address] AT [facility] [address] [place] [address] Phone: [phone] Fax: [phone]   Patient reports affordability concerns with their medications: No  Patient reports access/transportation concerns to their pharmacy: No  Patient reports adherence concerns with their medications:  No  - does appear to be late to refill numerous medications but reports she has all of her medications and is taking appropriately   Diabetes:  Current medications: Tresiba 45 units in the evening, Novolog 32 units with each meal, Trulicity (stopped taking) Medications tried in the past: Trulicity (stomach issues/headaches), Metformin (cannot recall), Invokana (yeast infections)  Current glucose readings: Reports usually 120-125 in the mornings and reports only seeing a number above 200 once in the last month that she can recall  Declines sensor use, pricking finger 3-4 times per day   Patient denies hypoglycemic s/sx including dizziness, shakiness, sweating. Patient denies hyperglycemic symptoms including polyuria, polydipsia, polyphagia, nocturia, neuropathy, blurred vision.    Objective:  Lab Results  Component Value Date   HGBA1C 8.2 (A) 01/14/2023    Lab Results  Component Value Date   CREATININE 1.52 (H) 07/09/2022   BUN 37 (H) 07/09/2022   NA 133 (L) 07/09/2022   K 5.4 (H) 07/09/2022   CL 100 07/09/2022   CO2 25 07/09/2022    Lab Results  Component Value Date    CHOL 137 07/09/2022   HDL 64.10 07/09/2022   LDLCALC 61 07/09/2022   LDLDIRECT 214.4 12/02/2012   TRIG 62.0 07/09/2022   CHOLHDL 2 07/09/2022    Medications Reviewed Today     Reviewed by Sherrill Raring, RPH (Pharmacist) on 04/07/23 at 1135  Med List Status: <None>   Medication Order Taking? Sig Documenting Provider Last Dose Status Informant  Accu-Chek Softclix Lancets lancets [id number]  Use 3 times daily to check glucose.  Dx E11.40 and Z79.4 Philip Aspen, Limmie Patricia, MD  Active   acetaminophen (TYLENOL) 650 MG CR tablet [id number] Yes Take 1,300 mg by mouth daily as needed for pain.  [provider] Taking Active Self  amLODipine (NORVASC) 10 MG tablet [id number] Yes TAKE 1 TABLET(10 MG) BY MOUTH DAILY Philip Aspen, Limmie Patricia, MD Taking Active   aspirin EC 81 MG tablet [id number] Yes Take 81 mg by mouth daily. [provider] Taking Active Self  atorvastatin (LIPITOR) 80 MG tablet [id number] Yes TAKE 1 TABLET(80 MG) BY MOUTH AT BEDTIME Philip Aspen, Limmie Patricia, MD Taking Active   Blood Glucose Monitoring Suppl (ACCU-CHEK GUIDE ME) w/Device KIT [id number]  1 Device by Does not apply route 3 (three) times daily. Shamleffer, Konrad Dolores, MD  Active   carvedilol (COREG) 25 MG tablet [id number] Yes TAKE 2 TABLETS(50 MG) BY MOUTH TWICE DAILY Philip Aspen, Limmie Patricia, MD Taking Active   chlorthalidone (HYGROTON) 50 MG tablet [id number] Yes TAKE 1 TABLET(50 MG) BY MOUTH DAILY Philip Aspen, Limmie Patricia, MD Taking Active   clopidogrel (PLAVIX) 75 MG tablet [id number] Yes TAKE 1 TABLET BY  MOUTH DAILY Lennette Bihari, MD Taking Active   diclofenac Sodium (VOLTAREN) 1 % GEL [id number]  APPLY 4 GRAMS TOPICALLY TO THE AFFECTED AREA FOUR TIMES DAILY Philip Aspen, Limmie Patricia, MD  Active   diphenoxylate-atropine (LOMOTIL) 2.5-0.025 MG tablet [id number]  Take 1 tablet by mouth 4 (four) times daily as needed for diarrhea or loose stools.  Patient not taking: Reported on 04/07/2023    Philip Aspen, Limmie Patricia, MD  Active   Dulaglutide (TRULICITY) 0.75 MG/0.5ML Namon Cirri [id number]  Inject 0.75 mg into the skin once a week.  Patient not taking: Reported on 01/14/2023   Shamleffer, Konrad Dolores, MD  Active   ezetimibe (ZETIA) 10 MG tablet [id number] Yes TAKE 1 TABLET(10 MG) BY MOUTH DAILY Philip Aspen, Limmie Patricia, MD Taking Active   glucose blood (ACCU-CHEK GUIDE) test strip [id number]  1 each by Other route 3 (three) times daily. Dx E11.40 and Z79.4 Philip Aspen, Limmie Patricia, MD  Active   glucose blood (ACCU-CHEK GUIDE) test strip [id number]  1 each by Other route 3 (three) times daily. Use as instructed Shamleffer, Konrad Dolores, MD  Active   insulin aspart (NOVOLOG FLEXPEN) 100 UNIT/ML FlexPen [id number]  Max daily 70 units daily Shamleffer, Konrad Dolores, MD  Active            Med Note Leitha Bleak, Letecia Arps H   Tue Apr 07, 2023 11:34 AM) Taking 32 units with each meal  insulin degludec (TRESIBA FLEXTOUCH) 200 UNIT/ML FlexTouch Pen [id number]  Inject 54 Units into the skin daily in the afternoon. Shamleffer, Konrad Dolores, MD  Active            Med Note Leitha Bleak, Carmencita Cusic H   Tue Apr 07, 2023 11:34 AM) 45 units  Insulin Pen Needle (BD PEN NEEDLE NANO 2ND GEN) 32G X 4 MM MISC [id number]  1 Device by Other route 4 (four) times daily. Shamleffer, Konrad Dolores, MD  Active   nitroGLYCERIN (NITROSTAT) 0.4 MG SL tablet [id number]  PLACE ONE TABLET UNDER THE TONGUE EVERY 5 MINUTES FOR 3 DOSES AS NEEDED FOR CHEST PAIN Philip Aspen, Limmie Patricia, MD  Active   potassium chloride SA (KLOR-CON M) 20 MEQ tablet [id number] Yes TAKE 1 TABLET BY MOUTH DAILY Lennette Bihari, MD Taking Active   REPATHA SURECLICK 140 MG/ML SOAJ [id number] Yes INJECT 1 PEN INTO THE SKIN EVERY 14 DAYS Lennette Bihari, MD Taking Active   spironolactone (ALDACTONE) 25 MG tablet [id number] Yes TAKE 1 TABLET(25 MG) BY MOUTH DAILY Philip Aspen, Limmie Patricia, MD Taking Active               Assessment/Plan:    Diabetes: - Currently uncontrolled per last A1C but reporting controlled readings at home - Reviewed long term cardiovascular and renal outcomes of uncontrolled blood sugar - Reviewed goal A1c, goal fasting, and goal 2 hour post prandial glucose - Reviewed dietary modifications including low carb diet - Recommend to continue current medication therapy and sch f/u with endo that you are past due for  - Patient denies personal or family history of multiple endocrine neoplasia type 2, medullary thyroid cancer; personal history of pancreatitis or gallbladder disease. - Recommend to check glucose 3-4x/day -Future Consideration: Retry a different SGLT2 such as farxiga or perhaps rybelsus to see if patient can tolerate better than the once weekly injection -Reminded patient of PCP appt on 04/22/23    Follow Up Plan: 4 months  Sherrill Raring, PharmD Clinical Pharmacist [phone]

## 2023-04-22 ENCOUNTER — Ambulatory Visit: Payer: Medicare Other | Admitting: Internal Medicine

## 2023-04-23 ENCOUNTER — Encounter: Payer: Self-pay | Admitting: Internal Medicine

## 2023-04-23 ENCOUNTER — Ambulatory Visit (INDEPENDENT_AMBULATORY_CARE_PROVIDER_SITE_OTHER): Payer: Medicare Other | Admitting: Internal Medicine

## 2023-04-23 VITALS — BP 136/76 | HR 88 | Temp 98.0°F | Wt 217.7 lb

## 2023-04-23 DIAGNOSIS — E669 Obesity, unspecified: Secondary | ICD-10-CM

## 2023-04-23 DIAGNOSIS — E1140 Type 2 diabetes mellitus with diabetic neuropathy, unspecified: Secondary | ICD-10-CM

## 2023-04-23 DIAGNOSIS — E785 Hyperlipidemia, unspecified: Secondary | ICD-10-CM | POA: Diagnosis not present

## 2023-04-23 DIAGNOSIS — Z794 Long term (current) use of insulin: Secondary | ICD-10-CM | POA: Diagnosis not present

## 2023-04-23 DIAGNOSIS — I10 Essential (primary) hypertension: Secondary | ICD-10-CM | POA: Diagnosis not present

## 2023-04-23 LAB — POCT GLYCOSYLATED HEMOGLOBIN (HGB A1C): Hemoglobin A1C: 9 % — AB (ref 4.0–5.6)

## 2023-04-23 MED ORDER — SPIRONOLACTONE 25 MG PO TABS: 25.0000 mg | ORAL_TABLET | Freq: Once | ORAL | 1 refills | Status: DC

## 2023-04-23 MED ORDER — AMLODIPINE BESYLATE 10 MG PO TABS: 10.0000 mg | ORAL_TABLET | Freq: Every day | ORAL | 1 refills | Status: DC

## 2023-04-23 MED ORDER — CHLORTHALIDONE 50 MG PO TABS: 50.0000 mg | ORAL_TABLET | Freq: Every day | ORAL | 1 refills | Status: DC

## 2023-04-23 MED ORDER — CARVEDILOL 25 MG PO TABS: ORAL_TABLET | ORAL | 1 refills | Status: DC

## 2023-04-23 NOTE — Progress Notes (Signed)
Established Patient Office Visit     CC/Reason for Visit: Follow-up chronic conditions  HPI: Lynn Hobbs is a 63 y.o. female who is coming in today for the above mentioned reasons. Past Medical History is significant for: significant for insulin-dependent diabetes that has not been well controlled, hypertension that is not well controlled, hyperlipidemia  history of coronary artery disease with non-STEMI, history of prior CVA with left-sided hemiparesis.  She is doing well today without acute concerns or complaints.  She has not seen endocrinologist in over a year.   Past Medical/Surgical History: Past Medical History:  Diagnosis Date   BENIGN NEOPLASM OF SKIN SITE UNSPECIFIED 09/08/2008   CEREBROVASCULAR DISEASE 12/11/2008   DIABETES MELLITUS, TYPE II 10/12/2006   Endometriosis    Fibroids    HYPERLIPIDEMIA 10/12/2006   HYPERTENSION 10/12/2006   PERIPHERAL NEUROPATHY 10/21/2006   Stroke [facility])     Past Surgical History:  Procedure Laterality Date   ABDOMINAL HYSTERECTOMY     LEFT HEART CATHETERIZATION WITH CORONARY ANGIOGRAM N/A 03/24/2013   Procedure: LEFT HEART CATHETERIZATION WITH CORONARY ANGIOGRAM;  Surgeon: Lennette Bihari, MD;  Location: [facility] CATH LAB;  Service: Cardiovascular;  Laterality: N/A;    Social History:  reports that she has never smoked. She has never used smokeless tobacco. She reports that she does not drink alcohol and does not use drugs.  Allergies: Allergies  Allergen Reactions   Tape Rash    Family History:  History reviewed. No pertinent family history.   Current Outpatient Medications:    Accu-Chek Softclix Lancets lancets, Use 3 times daily to check glucose.  Dx E11.40 and Z79.4, Disp: 300 each, Rfl: 1   acetaminophen (TYLENOL) 650 MG CR tablet, Take 1,300 mg by mouth daily as needed for pain. , Disp: , Rfl:    aspirin EC 81 MG tablet, Take 81 mg by mouth daily., Disp: , Rfl:    atorvastatin (LIPITOR) 80 MG tablet, TAKE 1 TABLET(80 MG) BY  MOUTH AT BEDTIME, Disp: 90 tablet, Rfl: 1   Blood Glucose Monitoring Suppl (ACCU-CHEK GUIDE ME) w/Device KIT, 1 Device by Does not apply route 3 (three) times daily., Disp: 1 kit, Rfl: 0   clopidogrel (PLAVIX) 75 MG tablet, TAKE 1 TABLET BY MOUTH DAILY, Disp: 60 tablet, Rfl: 6   diclofenac Sodium (VOLTAREN) 1 % GEL, APPLY 4 GRAMS TOPICALLY TO THE AFFECTED AREA FOUR TIMES DAILY, Disp: 100 g, Rfl: 1   diphenoxylate-atropine (LOMOTIL) 2.5-0.025 MG tablet, Take 1 tablet by mouth 4 (four) times daily as needed for diarrhea or loose stools., Disp: 30 tablet, Rfl: 2   Dulaglutide (TRULICITY) 0.75 MG/0.5ML SOPN, Inject 0.75 mg into the skin once a week., Disp: 6 mL, Rfl: 3   ezetimibe (ZETIA) 10 MG tablet, TAKE 1 TABLET(10 MG) BY MOUTH DAILY, Disp: 90 tablet, Rfl: 1   glucose blood (ACCU-CHEK GUIDE) test strip, 1 each by Other route 3 (three) times daily. Dx E11.40 and Z79.4, Disp: 300 each, Rfl: 1   glucose blood (ACCU-CHEK GUIDE) test strip, 1 each by Other route 3 (three) times daily. Use as instructed, Disp: 300 each, Rfl: 3   insulin aspart (NOVOLOG FLEXPEN) 100 UNIT/ML FlexPen, Max daily 70 units daily, Disp: 60 mL, Rfl: 2   insulin degludec (TRESIBA FLEXTOUCH) 200 UNIT/ML FlexTouch Pen, Inject 54 Units into the skin daily in the afternoon., Disp: 30 mL, Rfl: 3   Insulin Pen Needle (BD PEN NEEDLE NANO 2ND GEN) 32G X 4 MM MISC, 1 Device by  Other route 4 (four) times daily., Disp: 400 each, Rfl: 3   nitroGLYCERIN (NITROSTAT) 0.4 MG SL tablet, PLACE ONE TABLET UNDER THE TONGUE EVERY 5 MINUTES FOR 3 DOSES AS NEEDED FOR CHEST PAIN, Disp: 25 tablet, Rfl: 2   potassium chloride SA (KLOR-CON M) 20 MEQ tablet, TAKE 1 TABLET BY MOUTH DAILY, Disp: 90 tablet, Rfl: 3   REPATHA SURECLICK 140 MG/ML SOAJ, INJECT 1 PEN INTO THE SKIN EVERY 14 DAYS, Disp: 2 mL, Rfl: 11   amLODipine (NORVASC) 10 MG tablet, Take 1 tablet (10 mg total) by mouth daily., Disp: 90 tablet, Rfl: 1   carvedilol (COREG) 25 MG tablet, Take 2  tablets twice daily, Disp: 360 tablet, Rfl: 1   chlorthalidone (HYGROTON) 50 MG tablet, Take 1 tablet (50 mg total) by mouth daily., Disp: 90 tablet, Rfl: 1   spironolactone (ALDACTONE) 25 MG tablet, Take 1 tablet (25 mg total) by mouth once for 1 dose., Disp: 90 tablet, Rfl: 1  Review of Systems:  Negative unless indicated in HPI.   Physical Exam: Vitals:   04/23/23 1134  BP: 136/76  Pulse: 88  Temp: 98 F (36.7 C)  TempSrc: Oral  SpO2: 99%  Weight: 217 lb 11.2 oz (98.7 kg)    Body mass index is 36.23 kg/m.   Physical Exam Vitals reviewed.  Constitutional:      Appearance: Normal appearance.  HENT:     Head: Normocephalic and atraumatic.  Eyes:     Conjunctiva/sclera: Conjunctivae normal.  Cardiovascular:     Rate and Rhythm: Normal rate and regular rhythm.  Pulmonary:     Effort: Pulmonary effort is normal.     Breath sounds: Normal breath sounds.  Skin:    General: Skin is warm and dry.  Neurological:     Mental Status: She is alert and oriented to person, place, and time.  Psychiatric:        Mood and Affect: Mood normal.        Behavior: Behavior normal.        Thought Content: Thought content normal.        Judgment: Judgment normal.      Impression and Plan:  Type 2 diabetes mellitus with diabetic neuropathy, with long-term current use of insulin (HCC) -     POCT glycosylated hemoglobin (Hb A1C)  Essential hypertension -     amLODIPine Besylate; Take 1 tablet (10 mg total) by mouth daily.  Dispense: 90 tablet; Refill: 1 -     Spironolactone; Take 1 tablet (25 mg total) by mouth once for 1 dose.  Dispense: 90 tablet; Refill: 1 -     Carvedilol; Take 2 tablets twice daily  Dispense: 360 tablet; Refill: 1 -     Chlorthalidone; Take 1 tablet (50 mg total) by mouth daily.  Dispense: 90 tablet; Refill: 1  Obesity (BMI 30-39.9)  Dyslipidemia   -A1c shows worsening to 9.0.  She is now insulin-dependent.  She has not seen endocrinology since December  2023.  I have urged her to schedule follow-up visit. -Blood pressure improved, medication refills have been sent. -Discussed healthy lifestyle, including increased physical activity and better food choices to promote weight loss.   Time spent:32 minutes reviewing chart, interviewing and examining patient and formulating plan of care.     Chaya Jan, MD [facility]

## 2023-05-06 ENCOUNTER — Other Ambulatory Visit: Payer: Self-pay | Admitting: *Deleted

## 2023-05-06 DIAGNOSIS — I10 Essential (primary) hypertension: Secondary | ICD-10-CM

## 2023-05-06 MED ORDER — SPIRONOLACTONE 25 MG PO TABS
ORAL_TABLET | ORAL | 1 refills | Status: DC
Start: 2023-05-06 — End: 2023-09-02

## 2023-05-21 ENCOUNTER — Other Ambulatory Visit: Payer: Self-pay | Admitting: Internal Medicine

## 2023-05-21 DIAGNOSIS — E1140 Type 2 diabetes mellitus with diabetic neuropathy, unspecified: Secondary | ICD-10-CM

## 2023-07-28 ENCOUNTER — Other Ambulatory Visit: Payer: Medicare Other

## 2023-08-10 ENCOUNTER — Telehealth: Payer: Self-pay | Admitting: Internal Medicine

## 2023-08-10 NOTE — Telephone Encounter (Signed)
 [employer] Long Term Disability/Life Waiver of Premium Benefits forms to be filled out--placed in dr's folder.  Please fax forms upon completion.

## 2023-08-10 NOTE — Telephone Encounter (Signed)
Placed in Dr Hernandez's red folder 

## 2023-08-11 ENCOUNTER — Ambulatory Visit: Admitting: "Endocrinology

## 2023-08-12 DIAGNOSIS — Z0279 Encounter for issue of other medical certificate: Secondary | ICD-10-CM

## 2023-08-13 ENCOUNTER — Ambulatory Visit: Payer: Medicare Other | Admitting: Internal Medicine

## 2023-08-13 NOTE — Telephone Encounter (Signed)
 Faxed and confirmed patient is aware

## 2023-09-02 ENCOUNTER — Encounter: Payer: Self-pay | Admitting: Internal Medicine

## 2023-09-02 ENCOUNTER — Ambulatory Visit: Payer: Self-pay | Admitting: Internal Medicine

## 2023-09-02 ENCOUNTER — Ambulatory Visit: Admitting: Internal Medicine

## 2023-09-02 VITALS — BP 130/80 | HR 75 | Temp 97.8°F | Wt 217.5 lb

## 2023-09-02 DIAGNOSIS — E785 Hyperlipidemia, unspecified: Secondary | ICD-10-CM

## 2023-09-02 DIAGNOSIS — I10 Essential (primary) hypertension: Secondary | ICD-10-CM | POA: Diagnosis not present

## 2023-09-02 DIAGNOSIS — N182 Chronic kidney disease, stage 2 (mild): Secondary | ICD-10-CM | POA: Diagnosis not present

## 2023-09-02 DIAGNOSIS — I6359 Cerebral infarction due to unspecified occlusion or stenosis of other cerebral artery: Secondary | ICD-10-CM

## 2023-09-02 DIAGNOSIS — E1140 Type 2 diabetes mellitus with diabetic neuropathy, unspecified: Secondary | ICD-10-CM | POA: Diagnosis not present

## 2023-09-02 DIAGNOSIS — Z6836 Body mass index (BMI) 36.0-36.9, adult: Secondary | ICD-10-CM

## 2023-09-02 DIAGNOSIS — Z794 Long term (current) use of insulin: Secondary | ICD-10-CM | POA: Diagnosis not present

## 2023-09-02 DIAGNOSIS — E669 Obesity, unspecified: Secondary | ICD-10-CM

## 2023-09-02 LAB — COMPREHENSIVE METABOLIC PANEL WITH GFR
ALT: 14 U/L (ref 0–35)
AST: 16 U/L (ref 0–37)
Albumin: 4.3 g/dL (ref 3.5–5.2)
Alkaline Phosphatase: 46 U/L (ref 39–117)
BUN: 28 mg/dL — ABNORMAL HIGH (ref 6–23)
CO2: 29 meq/L (ref 19–32)
Calcium: 10.2 mg/dL (ref 8.4–10.5)
Chloride: 98 meq/L (ref 96–112)
Creatinine, Ser: 1.3 mg/dL — ABNORMAL HIGH (ref 0.40–1.20)
GFR: 43.91 mL/min — ABNORMAL LOW (ref >60.00)
Glucose, Bld: 276 mg/dL — ABNORMAL HIGH (ref 70–99)
Potassium: 4.5 meq/L (ref 3.5–5.1)
Sodium: 136 meq/L (ref 135–145)
Total Bilirubin: 0.4 mg/dL (ref 0.2–1.2)
Total Protein: 8.3 g/dL (ref 6.0–8.3)

## 2023-09-02 LAB — CBC WITH DIFFERENTIAL/PLATELET
Basophils Absolute: 0 10*3/uL (ref 0.0–0.1)
Basophils Relative: 0.5 % (ref 0.0–3.0)
Eosinophils Absolute: 0.1 10*3/uL (ref 0.0–0.7)
Eosinophils Relative: 1.6 % (ref 0.0–5.0)
HCT: 38.6 % (ref 36.0–46.0)
Hemoglobin: 12.5 g/dL (ref 12.0–15.0)
Lymphocytes Relative: 21.5 % (ref 12.0–46.0)
Lymphs Abs: 1.9 10*3/uL (ref 0.7–4.0)
MCHC: 32.5 g/dL (ref 30.0–36.0)
MCV: 79.4 fl (ref 78.0–100.0)
Monocytes Absolute: 0.7 10*3/uL (ref 0.1–1.0)
Monocytes Relative: 7.5 % (ref 3.0–12.0)
Neutro Abs: 6 10*3/uL (ref 1.4–7.7)
Neutrophils Relative %: 68.9 % (ref 43.0–77.0)
Platelets: 189 10*3/uL (ref 150.0–400.0)
RBC: 4.86 Mil/uL (ref 3.87–5.11)
RDW: 15.7 % — ABNORMAL HIGH (ref 11.5–15.5)
WBC: 8.7 10*3/uL (ref 4.0–10.5)

## 2023-09-02 LAB — POCT GLYCOSYLATED HEMOGLOBIN (HGB A1C): Hemoglobin A1C: 9.4 % — AB (ref 4.0–5.6)

## 2023-09-02 LAB — LIPID PANEL
Cholesterol: 228 mg/dL — ABNORMAL HIGH (ref 0–200)
HDL: 57.4 mg/dL (ref >39.00)
LDL Cholesterol: 146 mg/dL — ABNORMAL HIGH (ref 0–99)
NonHDL: 170.95
Total CHOL/HDL Ratio: 4
Triglycerides: 124 mg/dL (ref 0.0–149.0)
VLDL: 24.8 mg/dL (ref 0.0–40.0)

## 2023-09-02 MED ORDER — SPIRONOLACTONE 25 MG PO TABS: ORAL_TABLET | ORAL | 1 refills | Status: DC

## 2023-09-02 MED ORDER — CARVEDILOL 25 MG PO TABS: ORAL_TABLET | ORAL | 1 refills | Status: AC

## 2023-09-02 MED ORDER — CHLORTHALIDONE 50 MG PO TABS: 50.0000 mg | ORAL_TABLET | Freq: Every day | ORAL | 1 refills | Status: DC

## 2023-09-02 MED ORDER — AMLODIPINE BESYLATE 10 MG PO TABS: 10.0000 mg | ORAL_TABLET | Freq: Every day | ORAL | 1 refills | Status: DC

## 2023-09-02 NOTE — Progress Notes (Signed)
 Established Patient Office Visit     CC/Reason for Visit: Follow-up chronic medical conditions  HPI: Lynn Hobbs is a 63 y.o. female who is coming in today for the above mentioned reasons. Past Medical History is significant for: insulin -dependent diabetes that has not been well controlled, hypertension that is not well controlled, hyperlipidemia  history of coronary artery disease with non-STEMI, history of prior CVA with left-sided hemiparesis.  She is doing well today without acute concerns or complaints.  Since she was last seen she stopped taking Trulicity  for abdominal discomfort but did not notify us .  She has her appointment with endocrinology scheduled for August.   Past Medical/Surgical History: Past Medical History:  Diagnosis Date   BENIGN NEOPLASM OF SKIN SITE UNSPECIFIED 09/08/2008   CEREBROVASCULAR DISEASE 12/11/2008   DIABETES MELLITUS, TYPE II 10/12/2006   Endometriosis    Fibroids    HYPERLIPIDEMIA 10/12/2006   HYPERTENSION 10/12/2006   PERIPHERAL NEUROPATHY 10/21/2006   Stroke [facility])     Past Surgical History:  Procedure Laterality Date   ABDOMINAL HYSTERECTOMY     LEFT HEART CATHETERIZATION WITH CORONARY ANGIOGRAM N/A 03/24/2013   Procedure: LEFT HEART CATHETERIZATION WITH CORONARY ANGIOGRAM;  Surgeon: Debby DELENA Sor, MD;  Location: [facility] CATH LAB;  Service: Cardiovascular;  Laterality: N/A;    Social History:  reports that she has never smoked. She has never used smokeless tobacco. She reports that she does not drink alcohol and does not use drugs.  Allergies: Allergies  Allergen Reactions   Tape Rash    Family History:  History reviewed. No pertinent family history.   Current Outpatient Medications:    Accu-Chek Softclix Lancets lancets, Use 3 times daily to check glucose.  Dx E11.40 and Z79.4, Disp: 300 each, Rfl: 1   acetaminophen  (TYLENOL ) 650 MG CR tablet, Take 1,300 mg by mouth daily as needed for pain. , Disp: , Rfl:    aspirin  EC 81 MG tablet,  Take 81 mg by mouth daily., Disp: , Rfl:    atorvastatin  (LIPITOR ) 80 MG tablet, TAKE 1 TABLET(80 MG) BY MOUTH AT BEDTIME, Disp: 90 tablet, Rfl: 1   Blood Glucose Monitoring Suppl (ACCU-CHEK GUIDE ME) w/Device KIT, 1 Device by Does not apply route 3 (three) times daily., Disp: 1 kit, Rfl: 0   clopidogrel  (PLAVIX ) 75 MG tablet, TAKE 1 TABLET BY MOUTH DAILY, Disp: 60 tablet, Rfl: 6   diclofenac  Sodium (VOLTAREN ) 1 % GEL, APPLY 4 GRAMS TOPICALLY TO THE AFFECTED AREA FOUR TIMES DAILY, Disp: 100 g, Rfl: 1   diphenoxylate -atropine  (LOMOTIL ) 2.5-0.025 MG tablet, Take 1 tablet by mouth 4 (four) times daily as needed for diarrhea or loose stools., Disp: 30 tablet, Rfl: 2   ezetimibe  (ZETIA ) 10 MG tablet, TAKE 1 TABLET(10 MG) BY MOUTH DAILY, Disp: 90 tablet, Rfl: 1   glucose blood (ACCU-CHEK GUIDE) test strip, 1 each by Other route 3 (three) times daily. Dx E11.40 and Z79.4, Disp: 300 each, Rfl: 1   glucose blood (ACCU-CHEK GUIDE) test strip, 1 each by Other route 3 (three) times daily. Use as instructed, Disp: 300 each, Rfl: 3   insulin  aspart (NOVOLOG  FLEXPEN) 100 UNIT/ML FlexPen, Max daily 70 units daily, Disp: 60 mL, Rfl: 2   insulin  degludec (TRESIBA  FLEXTOUCH) 200 UNIT/ML FlexTouch Pen, Inject 54 Units into the skin daily in the afternoon., Disp: 30 mL, Rfl: 3   Insulin  Pen Needle (BD PEN NEEDLE NANO 2ND GEN) 32G X 4 MM MISC, 1 Device by Other route 4 (four)  times daily., Disp: 400 each, Rfl: 3   nitroGLYCERIN  (NITROSTAT ) 0.4 MG SL tablet, PLACE ONE TABLET UNDER THE TONGUE EVERY 5 MINUTES FOR 3 DOSES AS NEEDED FOR CHEST PAIN, Disp: 25 tablet, Rfl: 2   potassium chloride  SA (KLOR-CON  M) 20 MEQ tablet, TAKE 1 TABLET BY MOUTH DAILY, Disp: 90 tablet, Rfl: 3   REPATHA  SURECLICK 140 MG/ML SOAJ, INJECT 1 PEN INTO THE SKIN EVERY 14 DAYS, Disp: 2 mL, Rfl: 11   amLODipine  (NORVASC ) 10 MG tablet, Take 1 tablet (10 mg total) by mouth daily., Disp: 90 tablet, Rfl: 1   carvedilol  (COREG ) 25 MG tablet, Take 2 tablets  twice daily, Disp: 360 tablet, Rfl: 1   chlorthalidone  (HYGROTON ) 50 MG tablet, Take 1 tablet (50 mg total) by mouth daily., Disp: 90 tablet, Rfl: 1   Dulaglutide  (TRULICITY ) 0.75 MG/0.5ML SOPN, Inject 0.75 mg into the skin once a week. (Patient not taking: Reported on 09/02/2023), Disp: 6 mL, Rfl: 3   spironolactone  (ALDACTONE ) 25 MG tablet, Take one tablet daily, Disp: 90 tablet, Rfl: 1  Review of Systems:  Negative unless indicated in HPI.   Physical Exam: Vitals:   09/02/23 1059  BP: 130/80  Pulse: 75  Temp: 97.8 F (36.6 C)  TempSrc: Oral  SpO2: 99%  Weight: 217 lb 8 oz (98.7 kg)    Body mass index is 36.19 kg/m.   Physical Exam   Impression and Plan:  Type 2 diabetes mellitus with diabetic neuropathy, with long-term current use of insulin  (HCC) -     POCT glycosylated hemoglobin (Hb A1C) -     Microalbumin / creatinine urine ratio -     CBC with Differential/Platelet; Future -     Comprehensive metabolic panel with GFR; Future  Essential hypertension -     amLODIPine  Besylate; Take 1 tablet (10 mg total) by mouth daily.  Dispense: 90 tablet; Refill: 1 -     Carvedilol ; Take 2 tablets twice daily  Dispense: 360 tablet; Refill: 1 -     Chlorthalidone ; Take 1 tablet (50 mg total) by mouth daily.  Dispense: 90 tablet; Refill: 1 -     Spironolactone ; Take one tablet daily  Dispense: 90 tablet; Refill: 1  Dyslipidemia -     Lipid panel; Future  Obesity (BMI 30-39.9)  Stage 2 chronic kidney disease  Cerebrovascular accident (CVA) due to occlusion of other cerebral artery (HCC)  -A1c has increased to 9.4.  I will leave further recommendations of treatment to endocrinology. - Medication refills have been provided. - Updated labs requested today.   Time spent:30 minutes reviewing chart, interviewing and examining patient and formulating plan of care.     [name], MD [facility]

## 2023-10-05 ENCOUNTER — Telehealth: Payer: Self-pay | Admitting: *Deleted

## 2023-10-05 NOTE — Telephone Encounter (Signed)
 Patient was identified as falling into the [place] Measure - Diabetes.   Patient was: Appointment already scheduled for:  10/28/23 with Dartha, MD.

## 2023-10-16 ENCOUNTER — Telehealth: Payer: Self-pay | Admitting: Pharmacy Technician

## 2023-10-16 NOTE — Progress Notes (Signed)
   10/16/2023  Patient ID: Lannie CINDERELLA Finder, female   DOB: 09/07/1960, 63 y.o.   MRN: [id number]  Patient engaged with clinical pharmacist for management of diabetes on 04/07/2023. Outreach by [employer] technician was requested.   Outreached patient to discuss diabetes medication management. Left voicemail for patient to return my call at their convenience.   Lametria Klunk, CPhT [facility] Office: [phone] Email: [email or web address]

## 2023-10-21 ENCOUNTER — Telehealth: Payer: Self-pay | Admitting: Pharmacy Technician

## 2023-10-21 NOTE — Progress Notes (Signed)
   10/21/2023  Patient ID: Lynn Hobbs, female   DOB: [birth year], 63 y.o.   MRN: [id number]  Patient engaged with clinical pharmacist for management of diabetes on 04/07/2023. Second outreach by [employer] was requested.   Outreached patient to discuss diabetes medication management. Left voicemail for patient to return my call at their convenience.   Patient returned the call about 5 minutes later. Explained the reason for the call and she informs this was not a good time. Inquired a good day and time to call her back and patient informs she would call me back as she had my number now.  Margree Gimbel, CPhT [facility] Office: [phone] Email: [email or web address]

## 2023-10-23 ENCOUNTER — Ambulatory Visit: Admitting: "Endocrinology

## 2023-10-28 ENCOUNTER — Ambulatory Visit: Admitting: "Endocrinology

## 2023-10-28 ENCOUNTER — Telehealth: Payer: Self-pay | Admitting: Pharmacy Technician

## 2023-10-28 NOTE — Progress Notes (Signed)
   10/28/2023 Name: Lynn Hobbs MRN: [id number] DOB: 1960/04/12  Patient is appearing on a report for True [place] Metric Diabetes and last engaged with the clinical pharmacist to discuss diabetes on 04/07/2023. Contacted patient today to discuss diabetes management and completed medication review.   Diabetes Plan from last clinical pharmacist appointment:  Diabetes: - Currently uncontrolled per last A1C but reporting controlled readings at home - Reviewed long term cardiovascular and renal outcomes of uncontrolled blood sugar - Reviewed goal A1c, goal fasting, and goal 2 hour post prandial glucose - Reviewed dietary modifications including low carb diet - Recommend to continue current medication therapy and sch f/u with endo that you are past due for  - Patient denies personal or family history of multiple endocrine neoplasia type 2, medullary thyroid  cancer; personal history of pancreatitis or gallbladder disease. - Recommend to check glucose 3-4x/day -Future Consideration: Retry a different SGLT2 such as farxiga or perhaps rybelsus to see if patient can tolerate better than the once weekly injection -Reminded patient of PCP appt on 04/22/23     Medication Adherence Barriers Identified:  Patient made recommended medication changes per plan: No Was not able to speak to patient today. However, spoke to Lynn Hobbs, patient's mom. HIPAA verified. Lynn Hobbs was inquiring if this was something new that has been started with these follow up phone calls.. Informed Lynn Hobbs that the patient's MD had referred her to Lynn Hobbs Pharmacist, for assistance in managing patient's diabetes and I was following up.  Lynn Hobbs was unaware that this referral was made. Lynn Hobbs informs she goes to all the appointments with her daughter since her daughter has suffered 2 strokes. She informs patient  takes the medications as they are sent in by Dr Delma and that Dr Acosta manages medications and refills. She was unable to specifically inform  as to what diabetes medications patient is taking just that patient adheres to taking the medications prescribed by Dr. Delma. Access issues with any new medication or testing device: No Lynn Hobbs informs patient  takes the  medications as prescribed by Dr. Delma. No data available in Dr first concerning fill history for Novolog  and Tresiba . It was noted on 09/02/23 that patient is not taking Trulicity . Medication dispense history for [facility] pharmacy shows Tresiba  last filled 07/07/22 for 85 days supply or 24ml and Novolog  last filled 02/18/2022 for 60ml or 58 days supply. Patient is checking blood sugars as prescribed: Yes Lynn Hobbs informs patient is checking blood sugar but could not provide any readings other than to say patient is doing ok. Patient has had to reschedule Endo appointment a few times per Lynn Hobbs due to having weakness in patient's legs. There is currently no Endocrinology appointment scheduled. There is a PCP appointment scheduled for 12/03/2023.  Medication Adherence Barriers Addressed/Actions Taken:  Reviewed medication changes per plan from last clinical pharmacist note. Educated patient to contact pharmacy regarding new prescriptions Reviewed instructions for monitoring blood sugars at home and reminded patient to keep a written log to review with pharmacist Reminded patient of date/time of upcoming clinical pharmacist follow up and any upcoming PCP/specialists visits. Patient denies transportation barriers to the appointment. Yes  Next clinical pharmacist appointment is scheduled for: TBD  Lynn Hobbs, CPhT [facility] Office: [phone] Email: [email or web address]

## 2023-10-30 ENCOUNTER — Telehealth: Payer: Self-pay

## 2023-10-30 NOTE — Progress Notes (Signed)
   10/30/2023  Patient ID: Lynn Hobbs, female   DOB: 04/20/1960, 63 y.o.   MRN: [id number]  Attempted to contact patient to follow up for DM medication management. Left HIPAA compliant message for patient to return my call at their convenience.   Jon VEAR Lindau, PharmD Clinical Pharmacist [phone]

## 2023-11-02 ENCOUNTER — Telehealth: Payer: Self-pay

## 2023-11-02 NOTE — Progress Notes (Signed)
   11/02/2023  Patient ID: Lynn Hobbs, female   DOB: 1961/02/09, 63 y.o.   MRN: [id number]  Attempted to contact patient to follow up for DM medication management. Left HIPAA compliant message for patient to return my call at their convenience.   Jon VEAR Lindau, PharmD Clinical Pharmacist [phone]

## 2023-12-03 ENCOUNTER — Ambulatory Visit: Admitting: Internal Medicine

## 2023-12-03 ENCOUNTER — Ambulatory Visit: Payer: Self-pay | Admitting: Internal Medicine

## 2023-12-03 ENCOUNTER — Encounter: Payer: Self-pay | Admitting: Internal Medicine

## 2023-12-03 VITALS — BP 120/76 | HR 82 | Temp 98.1°F | Wt 222.1 lb

## 2023-12-03 DIAGNOSIS — Z8673 Personal history of transient ischemic attack (TIA), and cerebral infarction without residual deficits: Secondary | ICD-10-CM | POA: Diagnosis not present

## 2023-12-03 DIAGNOSIS — L816 Other disorders of diminished melanin formation: Secondary | ICD-10-CM | POA: Diagnosis not present

## 2023-12-03 DIAGNOSIS — Z23 Encounter for immunization: Secondary | ICD-10-CM

## 2023-12-03 DIAGNOSIS — I10 Essential (primary) hypertension: Secondary | ICD-10-CM | POA: Diagnosis not present

## 2023-12-03 DIAGNOSIS — N182 Chronic kidney disease, stage 2 (mild): Secondary | ICD-10-CM

## 2023-12-03 DIAGNOSIS — Z794 Long term (current) use of insulin: Secondary | ICD-10-CM | POA: Diagnosis not present

## 2023-12-03 DIAGNOSIS — E785 Hyperlipidemia, unspecified: Secondary | ICD-10-CM

## 2023-12-03 DIAGNOSIS — E1142 Type 2 diabetes mellitus with diabetic polyneuropathy: Secondary | ICD-10-CM

## 2023-12-03 DIAGNOSIS — E1140 Type 2 diabetes mellitus with diabetic neuropathy, unspecified: Secondary | ICD-10-CM | POA: Diagnosis not present

## 2023-12-03 LAB — MICROALBUMIN / CREATININE URINE RATIO
Creatinine,U: 75.6 mg/dL
Microalb Creat Ratio: 94.9 mg/g — ABNORMAL HIGH (ref 0.0–30.0)
Microalb, Ur: 7.2 mg/dL — ABNORMAL HIGH (ref 0.0–1.9)

## 2023-12-03 LAB — POCT GLYCOSYLATED HEMOGLOBIN (HGB A1C): Hemoglobin A1C: 8.6 % — AB (ref 4.0–5.6)

## 2023-12-03 LAB — LIPID PANEL
Cholesterol: 266 mg/dL — ABNORMAL HIGH (ref 0–200)
HDL: 58.8 mg/dL (ref >39.00)
LDL Cholesterol: 179 mg/dL — ABNORMAL HIGH (ref 0–99)
NonHDL: 207.16
Total CHOL/HDL Ratio: 5
Triglycerides: 143 mg/dL (ref 0.0–149.0)
VLDL: 28.6 mg/dL (ref 0.0–40.0)

## 2023-12-03 MED ORDER — TACROLIMUS 0.1 % EX OINT: TOPICAL_OINTMENT | Freq: Two times a day (BID) | CUTANEOUS | 0 refills | Status: AC

## 2023-12-03 MED ORDER — SPIRONOLACTONE 25 MG PO TABS: ORAL_TABLET | ORAL | 1 refills | Status: AC

## 2023-12-03 MED ORDER — NOVOLOG FLEXPEN 100 UNIT/ML ~~LOC~~ SOPN: PEN_INJECTOR | SUBCUTANEOUS | 2 refills | Status: AC

## 2023-12-03 NOTE — Addendum Note (Signed)
 Addended by: KATHRYNE MILLMAN B on: 12/03/2023 12:03 PM   Modules accepted: Orders

## 2023-12-03 NOTE — Assessment & Plan Note (Signed)
 Baseline creatinine around 1.3-1.4.

## 2023-12-03 NOTE — Assessment & Plan Note (Signed)
 Uncontrolled as of last check.  On Repatha , ezetimibe  and atorvastatin .  She admits to not taking atorvastatin  routinely.  Recheck lipids today.

## 2023-12-03 NOTE — Assessment & Plan Note (Signed)
 Discussed healthy lifestyle, including increased physical activity and better food choices to promote weight loss.

## 2023-12-03 NOTE — Progress Notes (Signed)
 Established Patient Office Visit     CC/Reason for Visit: Follow-up chronic medical conditions  HPI: Lynn Hobbs is a 63 y.o. female who is coming in today for the above mentioned reasons. Past Medical History is significant for: insulin -dependent diabetes that has not been well controlled, hypertension that is not well controlled, hyperlipidemia  history of coronary artery disease with non-STEMI, history of prior CVA with left-sided hemiparesis.  For the past 2 months or so has noticed hypopigmented spots on her chin.  No itching.   Past Medical/Surgical History: Past Medical History:  Diagnosis Date   BENIGN NEOPLASM OF SKIN SITE UNSPECIFIED 09/08/2008   CEREBROVASCULAR DISEASE 12/11/2008   DIABETES MELLITUS, TYPE II 10/12/2006   Endometriosis    Fibroids    HYPERLIPIDEMIA 10/12/2006   HYPERTENSION 10/12/2006   PERIPHERAL NEUROPATHY 10/21/2006   Stroke [facility])     Past Surgical History:  Procedure Laterality Date   ABDOMINAL HYSTERECTOMY     LEFT HEART CATHETERIZATION WITH CORONARY ANGIOGRAM N/A 03/24/2013   Procedure: LEFT HEART CATHETERIZATION WITH CORONARY ANGIOGRAM;  Surgeon: Debby DELENA Sor, MD;  Location: [facility] CATH LAB;  Service: Cardiovascular;  Laterality: N/A;    Social History:  reports that she has never smoked. She has never used smokeless tobacco. She reports that she does not drink alcohol and does not use drugs.  Allergies: Allergies  Allergen Reactions   Tape Rash    Family History:  History reviewed. No pertinent family history.   Current Outpatient Medications:    Accu-Chek Softclix Lancets lancets, Use 3 times daily to check glucose.  Dx E11.40 and Z79.4, Disp: 300 each, Rfl: 1   acetaminophen  (TYLENOL ) 650 MG CR tablet, Take 1,300 mg by mouth daily as needed for pain. , Disp: , Rfl:    amLODipine  (NORVASC ) 10 MG tablet, Take 1 tablet (10 mg total) by mouth daily., Disp: 90 tablet, Rfl: 1   aspirin  EC 81 MG tablet, Take 81 mg by mouth daily., Disp: ,  Rfl:    atorvastatin  (LIPITOR ) 80 MG tablet, TAKE 1 TABLET(80 MG) BY MOUTH AT BEDTIME, Disp: 90 tablet, Rfl: 1   Blood Glucose Monitoring Suppl (ACCU-CHEK GUIDE ME) w/Device KIT, 1 Device by Does not apply route 3 (three) times daily., Disp: 1 kit, Rfl: 0   carvedilol  (COREG ) 25 MG tablet, Take 2 tablets twice daily, Disp: 360 tablet, Rfl: 1   chlorthalidone  (HYGROTON ) 50 MG tablet, Take 1 tablet (50 mg total) by mouth daily., Disp: 90 tablet, Rfl: 1   clopidogrel  (PLAVIX ) 75 MG tablet, TAKE 1 TABLET BY MOUTH DAILY, Disp: 60 tablet, Rfl: 6   diclofenac  Sodium (VOLTAREN ) 1 % GEL, APPLY 4 GRAMS TOPICALLY TO THE AFFECTED AREA FOUR TIMES DAILY, Disp: 100 g, Rfl: 1   diphenoxylate -atropine  (LOMOTIL ) 2.5-0.025 MG tablet, Take 1 tablet by mouth 4 (four) times daily as needed for diarrhea or loose stools., Disp: 30 tablet, Rfl: 2   Dulaglutide  (TRULICITY ) 0.75 MG/0.5ML SOPN, Inject 0.75 mg into the skin once a week., Disp: 6 mL, Rfl: 3   ezetimibe  (ZETIA ) 10 MG tablet, TAKE 1 TABLET(10 MG) BY MOUTH DAILY, Disp: 90 tablet, Rfl: 1   glucose blood (ACCU-CHEK GUIDE) test strip, 1 each by Other route 3 (three) times daily. Dx E11.40 and Z79.4, Disp: 300 each, Rfl: 1   glucose blood (ACCU-CHEK GUIDE) test strip, 1 each by Other route 3 (three) times daily. Use as instructed, Disp: 300 each, Rfl: 3   insulin  degludec (TRESIBA  FLEXTOUCH) 200 UNIT/ML  FlexTouch Pen, Inject 54 Units into the skin daily in the afternoon., Disp: 30 mL, Rfl: 3   Insulin  Pen Needle (BD PEN NEEDLE NANO 2ND GEN) 32G X 4 MM MISC, 1 Device by Other route 4 (four) times daily., Disp: 400 each, Rfl: 3   nitroGLYCERIN  (NITROSTAT ) 0.4 MG SL tablet, PLACE ONE TABLET UNDER THE TONGUE EVERY 5 MINUTES FOR 3 DOSES AS NEEDED FOR CHEST PAIN, Disp: 25 tablet, Rfl: 2   potassium chloride  SA (KLOR-CON  M) 20 MEQ tablet, TAKE 1 TABLET BY MOUTH DAILY, Disp: 90 tablet, Rfl: 3   REPATHA  SURECLICK 140 MG/ML SOAJ, INJECT 1 PEN INTO THE SKIN EVERY 14 DAYS, Disp: 2  mL, Rfl: 11   tacrolimus  (PROTOPIC ) 0.1 % ointment, Apply topically 2 (two) times daily., Disp: 100 g, Rfl: 0   insulin  aspart (NOVOLOG  FLEXPEN) 100 UNIT/ML FlexPen, Max daily 70 units daily, Disp: 60 mL, Rfl: 2   spironolactone  (ALDACTONE ) 25 MG tablet, Take one tablet daily, Disp: 90 tablet, Rfl: 1  Review of Systems:  Negative unless indicated in HPI.   Physical Exam: Vitals:   12/03/23 1040  BP: 120/76  Pulse: 82  Temp: 98.1 F (36.7 C)  TempSrc: Oral  SpO2: 99%  Weight: 222 lb 1.6 oz (100.7 kg)    Body mass index is 36.96 kg/m.   Physical Exam Vitals reviewed.  Constitutional:      Appearance: Normal appearance.  HENT:     Head: Normocephalic and atraumatic.  Eyes:     Conjunctiva/sclera: Conjunctivae normal.  Cardiovascular:     Rate and Rhythm: Normal rate and regular rhythm.  Pulmonary:     Effort: Pulmonary effort is normal.     Breath sounds: Normal breath sounds.  Skin:    General: Skin is warm and dry.  Neurological:     Mental Status: She is alert and oriented to person, place, and time.  Psychiatric:        Mood and Affect: Mood normal.        Behavior: Behavior normal.        Thought Content: Thought content normal.        Judgment: Judgment normal.      Impression and Plan:  Type 2 diabetes mellitus with diabetic neuropathy, with long-term current use of insulin  (HCC) Assessment & Plan: Uncontrolled although a little improved with A1c down to 8.6 from 9.4 previously.  Referral to endocrinology has been placed, appointment is upcoming.  Orders: -     POCT glycosylated hemoglobin (Hb A1C) -     Microalbumin / creatinine urine ratio -     NovoLOG  FlexPen; Max daily 70 units daily  Dispense: 60 mL; Refill: 2  Essential hypertension Assessment & Plan: Well-controlled on current.  Orders: -     Spironolactone ; Take one tablet daily  Dispense: 90 tablet; Refill: 1  Dyslipidemia Assessment & Plan: Uncontrolled as of last check.  On  Repatha , ezetimibe  and atorvastatin .  She admits to not taking atorvastatin  routinely.  Recheck lipids today.  Orders: -     Lipid panel; Future  Stage 2 chronic kidney disease Assessment & Plan: Baseline creatinine around 1.3-1.4.   Morbid obesity (HCC) Assessment & Plan: -Discussed healthy lifestyle, including increased physical activity and better food choices to promote weight loss.    History of cardioembolic cerebrovascular accident (CVA) Assessment & Plan: Noted   Immunization due  Hypopigmentation of skin -     Tacrolimus ; Apply topically 2 (two) times daily.  Dispense: 100 g; Refill:  0  - Flu vaccine given in office today. - Unsure of cause of hypopigmentation on her chin, will try some tacrolimus  cream.  If no improvement can consider dermatology referral.   Time spent:33 minutes reviewing chart, interviewing and examining patient and formulating plan of care.     [name], MD [facility]

## 2023-12-03 NOTE — Assessment & Plan Note (Signed)
 Well-controlled on current.

## 2023-12-03 NOTE — Assessment & Plan Note (Signed)
 Noted

## 2023-12-03 NOTE — Assessment & Plan Note (Signed)
 Uncontrolled although a little improved with A1c down to 8.6 from 9.4 previously.  Referral to endocrinology has been placed, appointment is upcoming.

## 2023-12-10 MED ORDER — EMPAGLIFLOZIN 10 MG PO TABS: 10.0000 mg | ORAL_TABLET | Freq: Every day | ORAL | 1 refills | Status: DC

## 2023-12-16 ENCOUNTER — Other Ambulatory Visit: Payer: Self-pay | Admitting: Internal Medicine

## 2023-12-16 DIAGNOSIS — I10 Essential (primary) hypertension: Secondary | ICD-10-CM

## 2023-12-29 ENCOUNTER — Telehealth: Payer: Self-pay

## 2023-12-29 NOTE — Telephone Encounter (Signed)
 Patient was identified as falling into the [place] Measure - Diabetes.   Patient was: Appointment already scheduled for:  03/07/2024 with Dr. Theophilus.

## 2024-01-06 ENCOUNTER — Other Ambulatory Visit: Payer: Self-pay | Admitting: General Practice

## 2024-01-06 DIAGNOSIS — I2510 Atherosclerotic heart disease of native coronary artery without angina pectoris: Secondary | ICD-10-CM

## 2024-01-06 MED ORDER — CLOPIDOGREL BISULFATE 75 MG PO TABS: 75.0000 mg | ORAL_TABLET | Freq: Every day | ORAL | 0 refills | Status: DC

## 2024-02-08 ENCOUNTER — Other Ambulatory Visit: Payer: Self-pay | Admitting: General Practice

## 2024-02-08 DIAGNOSIS — I2510 Atherosclerotic heart disease of native coronary artery without angina pectoris: Secondary | ICD-10-CM

## 2024-02-14 ENCOUNTER — Other Ambulatory Visit: Payer: Self-pay | Admitting: General Practice

## 2024-02-14 DIAGNOSIS — I2510 Atherosclerotic heart disease of native coronary artery without angina pectoris: Secondary | ICD-10-CM

## 2024-02-24 ENCOUNTER — Telehealth: Payer: Self-pay

## 2024-02-24 NOTE — Telephone Encounter (Signed)
 Patient was identified as falling into the [place] Measure - Diabetes.   Patient was: Appointment already scheduled for:  12/29.

## 2024-02-28 ENCOUNTER — Other Ambulatory Visit: Payer: Self-pay | Admitting: Internal Medicine

## 2024-02-28 DIAGNOSIS — I10 Essential (primary) hypertension: Secondary | ICD-10-CM

## 2024-03-05 ENCOUNTER — Other Ambulatory Visit: Payer: Self-pay | Admitting: General Practice

## 2024-03-05 DIAGNOSIS — I2510 Atherosclerotic heart disease of native coronary artery without angina pectoris: Secondary | ICD-10-CM

## 2024-03-07 ENCOUNTER — Ambulatory Visit: Admitting: Internal Medicine

## 2024-03-07 ENCOUNTER — Encounter: Payer: Self-pay | Admitting: Internal Medicine

## 2024-03-07 VITALS — BP 128/60 | HR 76 | Temp 97.8°F | Ht 65.0 in | Wt 211.3 lb

## 2024-03-07 DIAGNOSIS — Z6835 Body mass index (BMI) 35.0-35.9, adult: Secondary | ICD-10-CM | POA: Diagnosis not present

## 2024-03-07 DIAGNOSIS — E1122 Type 2 diabetes mellitus with diabetic chronic kidney disease: Secondary | ICD-10-CM

## 2024-03-07 DIAGNOSIS — I10 Essential (primary) hypertension: Secondary | ICD-10-CM | POA: Diagnosis not present

## 2024-03-07 DIAGNOSIS — N182 Chronic kidney disease, stage 2 (mild): Secondary | ICD-10-CM

## 2024-03-07 DIAGNOSIS — E785 Hyperlipidemia, unspecified: Secondary | ICD-10-CM | POA: Diagnosis not present

## 2024-03-07 DIAGNOSIS — Z794 Long term (current) use of insulin: Secondary | ICD-10-CM | POA: Diagnosis not present

## 2024-03-07 DIAGNOSIS — E1142 Type 2 diabetes mellitus with diabetic polyneuropathy: Secondary | ICD-10-CM

## 2024-03-07 LAB — POCT GLYCOSYLATED HEMOGLOBIN (HGB A1C): Hemoglobin A1C: 9.2 % — AB (ref 4.0–5.6)

## 2024-03-07 LAB — LIPID PANEL
Cholesterol: 145 mg/dL (ref 28–200)
HDL: 57.6 mg/dL
LDL Cholesterol: 69 mg/dL (ref 10–99)
NonHDL: 87.39
Total CHOL/HDL Ratio: 3
Triglycerides: 94 mg/dL (ref 10.0–149.0)
VLDL: 18.8 mg/dL (ref 0.0–40.0)

## 2024-03-07 LAB — COMPREHENSIVE METABOLIC PANEL WITH GFR
ALT: 22 U/L (ref 3–35)
AST: 23 U/L (ref 5–37)
Albumin: 4.5 g/dL (ref 3.5–5.2)
Alkaline Phosphatase: 63 U/L (ref 39–117)
BUN: 33 mg/dL — ABNORMAL HIGH (ref 6–23)
CO2: 30 meq/L (ref 19–32)
Calcium: 10.6 mg/dL — ABNORMAL HIGH (ref 8.4–10.5)
Chloride: 96 meq/L (ref 96–112)
Creatinine, Ser: 1.47 mg/dL — ABNORMAL HIGH (ref 0.40–1.20)
GFR: 37.75 mL/min — ABNORMAL LOW
Glucose, Bld: 285 mg/dL — ABNORMAL HIGH (ref 70–99)
Potassium: 4.5 meq/L (ref 3.5–5.1)
Sodium: 138 meq/L (ref 135–145)
Total Bilirubin: 0.5 mg/dL (ref 0.2–1.2)
Total Protein: 8.7 g/dL — ABNORMAL HIGH (ref 6.0–8.3)

## 2024-03-07 NOTE — Assessment & Plan Note (Signed)
 Not well-controlled, A1c increased to 9.2.  Suspect there is some medication nonadherence as well as lifestyle and dietary nonadherence.  Referral to endocrinology has been placed.  Referral to pharmacy as well for medication management and adherence.

## 2024-03-07 NOTE — Assessment & Plan Note (Signed)
 Uncontrolled as of last check.  On Repatha , ezetimibe  and atorvastatin .  She admits to not taking atorvastatin  or Repatha  routinely.  Recheck lipids today.  Schedule follow-up with pharmacist for medication adherence.

## 2024-03-07 NOTE — Assessment & Plan Note (Signed)
 Well-controlled on current.

## 2024-03-07 NOTE — Assessment & Plan Note (Signed)
 Baseline creatinine around 1.3-1.4.  Now on Jardiance .

## 2024-03-07 NOTE — Assessment & Plan Note (Signed)
 Discussed healthy lifestyle, including increased physical activity and better food choices to promote weight loss.

## 2024-03-07 NOTE — Progress Notes (Signed)
 "    Established Patient Office Visit     CC/Reason for Visit: Follow-up chronic medical conditions  HPI: Lynn Hobbs is a 63 y.o. female who is coming in today for the above mentioned reasons. Past Medical History is significant for: Insulin -dependent diabetes, hypertension, hyperlipidemia, history of CVA status post left hemiparesis, coronary artery disease.  She has been feeling well.  She has not been taking Repatha .  She has been taking Jardiance .  It is unclear to me exactly what she is taking.  Apparently has not received a call from the diabetic referral we had placed.   Past Medical/Surgical History: Past Medical History:  Diagnosis Date   BENIGN NEOPLASM OF SKIN SITE UNSPECIFIED 09/08/2008   CEREBROVASCULAR DISEASE 12/11/2008   DIABETES MELLITUS, TYPE II 10/12/2006   Endometriosis    Fibroids    HYPERLIPIDEMIA 10/12/2006   HYPERTENSION 10/12/2006   PERIPHERAL NEUROPATHY 10/21/2006   Stroke [facility])     Past Surgical History:  Procedure Laterality Date   ABDOMINAL HYSTERECTOMY     LEFT HEART CATHETERIZATION WITH CORONARY ANGIOGRAM N/A 03/24/2013   Procedure: LEFT HEART CATHETERIZATION WITH CORONARY ANGIOGRAM;  Surgeon: Debby DELENA Sor, MD;  Location: [facility] CATH LAB;  Service: Cardiovascular;  Laterality: N/A;    Social History:  reports that she has never smoked. She has never used smokeless tobacco. She reports that she does not drink alcohol and does not use drugs.  Allergies: Allergies[1]  Family History:  History reviewed. No pertinent family history.  Current Medications[2]  Review of Systems:  Negative unless indicated in HPI.   Physical Exam: Vitals:   03/07/24 1102  BP: 128/60  Pulse: 76  Temp: 97.8 F (36.6 C)  TempSrc: Oral  SpO2: 98%  Weight: 211 lb 4.8 oz (95.8 kg)  Height: 5' 5 (1.651 m)    Body mass index is 35.16 kg/m.   Physical Exam Vitals reviewed.  Constitutional:      Appearance: Normal appearance.  HENT:     Head: Normocephalic  and atraumatic.  Eyes:     Conjunctiva/sclera: Conjunctivae normal.  Cardiovascular:     Rate and Rhythm: Normal rate and regular rhythm.  Pulmonary:     Effort: Pulmonary effort is normal.     Breath sounds: Normal breath sounds.  Skin:    General: Skin is warm and dry.  Neurological:     Mental Status: She is alert and oriented to person, place, and time.  Psychiatric:        Mood and Affect: Mood normal.        Behavior: Behavior normal.        Thought Content: Thought content normal.        Judgment: Judgment normal.      Impression and Plan:  Type 2 diabetes mellitus with diabetic polyneuropathy, with long-term current use of insulin  (HCC) Assessment & Plan: Not well-controlled, A1c increased to 9.2.  Suspect there is some medication nonadherence as well as lifestyle and dietary nonadherence.  Referral to endocrinology has been placed.  Referral to pharmacy as well for medication management and adherence.  Orders: -     POCT glycosylated hemoglobin (Hb A1C) -     Ambulatory referral to Endocrinology -     Amb Referral to Clinical Pharmacist  Essential hypertension Assessment & Plan: Well-controlled on current.   Stage 2 chronic kidney disease Assessment & Plan: Baseline creatinine around 1.3-1.4.  Now on Jardiance .   Morbid obesity (HCC) Assessment & Plan: -Discussed healthy lifestyle, including increased  physical activity and better food choices to promote weight loss.    Dyslipidemia Assessment & Plan: Uncontrolled as of last check.  On Repatha , ezetimibe  and atorvastatin .  She admits to not taking atorvastatin  or Repatha  routinely.  Recheck lipids today.  Schedule follow-up with pharmacist for medication adherence.  Orders: -     Lipid panel; Future -     Comprehensive metabolic panel with GFR; Future     Time spent:32 minutes reviewing chart, interviewing and examining patient and formulating plan of care.     [name],  MD [facility]     [1]  Allergies Allergen Reactions   Tape Rash  [2]  Current Outpatient Medications:    Accu-Chek Softclix Lancets lancets, Use 3 times daily to check glucose.  Dx E11.40 and Z79.4, Disp: 300 each, Rfl: 1   acetaminophen  (TYLENOL ) 650 MG CR tablet, Take 1,300 mg by mouth daily as needed for pain. , Disp: , Rfl:    amLODipine  (NORVASC ) 10 MG tablet, TAKE 1 TABLET(10 MG) BY MOUTH DAILY, Disp: 90 tablet, Rfl: 1   aspirin  EC 81 MG tablet, Take 81 mg by mouth daily., Disp: , Rfl:    atorvastatin  (LIPITOR ) 80 MG tablet, TAKE 1 TABLET(80 MG) BY MOUTH AT BEDTIME, Disp: 90 tablet, Rfl: 1   Blood Glucose Monitoring Suppl (ACCU-CHEK GUIDE ME) w/Device KIT, 1 Device by Does not apply route 3 (three) times daily., Disp: 1 kit, Rfl: 0   carvedilol  (COREG ) 25 MG tablet, Take 2 tablets twice daily, Disp: 360 tablet, Rfl: 1   chlorthalidone  (HYGROTON ) 50 MG tablet, TAKE 1 TABLET(50 MG) BY MOUTH DAILY, Disp: 90 tablet, Rfl: 1   diclofenac  Sodium (VOLTAREN ) 1 % GEL, APPLY 4 GRAMS TOPICALLY TO THE AFFECTED AREA FOUR TIMES DAILY, Disp: 100 g, Rfl: 1   diphenoxylate -atropine  (LOMOTIL ) 2.5-0.025 MG tablet, Take 1 tablet by mouth 4 (four) times daily as needed for diarrhea or loose stools., Disp: 30 tablet, Rfl: 2   Dulaglutide  (TRULICITY ) 0.75 MG/0.5ML SOPN, Inject 0.75 mg into the skin once a week., Disp: 6 mL, Rfl: 3   empagliflozin  (JARDIANCE ) 10 MG TABS tablet, Take 1 tablet (10 mg total) by mouth daily., Disp: 90 tablet, Rfl: 1   ezetimibe  (ZETIA ) 10 MG tablet, TAKE 1 TABLET(10 MG) BY MOUTH DAILY, Disp: 90 tablet, Rfl: 1   glucose blood (ACCU-CHEK GUIDE) test strip, 1 each by Other route 3 (three) times daily. Dx E11.40 and Z79.4, Disp: 300 each, Rfl: 1   glucose blood (ACCU-CHEK GUIDE) test strip, 1 each by Other route 3 (three) times daily. Use as instructed, Disp: 300 each, Rfl: 3   insulin  aspart (NOVOLOG  FLEXPEN) 100 UNIT/ML FlexPen, Max daily 70 units daily,  Disp: 60 mL, Rfl: 2   insulin  degludec (TRESIBA  FLEXTOUCH) 200 UNIT/ML FlexTouch Pen, Inject 54 Units into the skin daily in the afternoon., Disp: 30 mL, Rfl: 3   Insulin  Pen Needle (BD PEN NEEDLE NANO 2ND GEN) 32G X 4 MM MISC, 1 Device by Other route 4 (four) times daily., Disp: 400 each, Rfl: 3   nitroGLYCERIN  (NITROSTAT ) 0.4 MG SL tablet, PLACE ONE TABLET UNDER THE TONGUE EVERY 5 MINUTES FOR 3 DOSES AS NEEDED FOR CHEST PAIN, Disp: 25 tablet, Rfl: 2   potassium chloride  SA (KLOR-CON  M) 20 MEQ tablet, TAKE 1 TABLET BY MOUTH DAILY, Disp: 90 tablet, Rfl: 3   REPATHA  SURECLICK 140 MG/ML SOAJ, INJECT 1 PEN INTO THE SKIN EVERY 14 DAYS, Disp: 2 mL, Rfl: 11  spironolactone  (ALDACTONE ) 25 MG tablet, Take one tablet daily, Disp: 90 tablet, Rfl: 1   tacrolimus  (PROTOPIC ) 0.1 % ointment, Apply topically 2 (two) times daily., Disp: 100 g, Rfl: 0  "

## 2024-03-15 ENCOUNTER — Ambulatory Visit: Payer: Self-pay | Admitting: Internal Medicine

## 2024-03-18 ENCOUNTER — Other Ambulatory Visit: Payer: Self-pay | Admitting: Internal Medicine

## 2024-03-18 DIAGNOSIS — E1142 Type 2 diabetes mellitus with diabetic polyneuropathy: Secondary | ICD-10-CM

## 2024-03-18 DIAGNOSIS — N182 Chronic kidney disease, stage 2 (mild): Secondary | ICD-10-CM

## 2024-06-22 ENCOUNTER — Ambulatory Visit: Admitting: Internal Medicine

## 2024-08-11 ENCOUNTER — Ambulatory Visit: Admitting: "Endocrinology
